# Patient Record
Sex: Male | Born: 1945 | Race: White | Hispanic: No | Marital: Married
Health system: Southern US, Academic
[De-identification: ages and names within clinical notes are randomized; demographics above are authoritative.]

## PROBLEM LIST (undated history)

## (undated) ENCOUNTER — Encounter

## (undated) ENCOUNTER — Telehealth

## (undated) ENCOUNTER — Encounter: Attending: Family Medicine | Primary: Family Medicine

## (undated) ENCOUNTER — Encounter: Attending: Internal Medicine | Primary: Internal Medicine

## (undated) ENCOUNTER — Telehealth
Attending: Student in an Organized Health Care Education/Training Program | Primary: Student in an Organized Health Care Education/Training Program

## (undated) ENCOUNTER — Ambulatory Visit: Payer: MEDICARE

## (undated) ENCOUNTER — Ambulatory Visit: Attending: Family Medicine | Primary: Family Medicine

## (undated) ENCOUNTER — Ambulatory Visit

## (undated) ENCOUNTER — Ambulatory Visit: Payer: MEDICARE | Attending: Family Medicine | Primary: Family Medicine

## (undated) ENCOUNTER — Ambulatory Visit: Payer: Medicare (Managed Care) | Attending: Internal Medicine | Primary: Internal Medicine

## (undated) ENCOUNTER — Non-Acute Institutional Stay
Payer: MEDICARE | Attending: Rehabilitative and Restorative Service Providers" | Primary: Rehabilitative and Restorative Service Providers"

## (undated) ENCOUNTER — Ambulatory Visit: Payer: MEDICARE | Attending: Cardiovascular Disease | Primary: Cardiovascular Disease

## (undated) ENCOUNTER — Ambulatory Visit: Payer: MEDICARE | Attending: Internal Medicine | Primary: Internal Medicine

## (undated) ENCOUNTER — Encounter: Attending: Family | Primary: Family

## (undated) ENCOUNTER — Ambulatory Visit: Payer: MEDICARE | Attending: Family | Primary: Family

## (undated) ENCOUNTER — Other Ambulatory Visit

## (undated) ENCOUNTER — Ambulatory Visit: Payer: Medicare (Managed Care) | Attending: Family Medicine | Primary: Family Medicine

## (undated) ENCOUNTER — Ambulatory Visit: Payer: Medicare (Managed Care)

## (undated) ENCOUNTER — Inpatient Hospital Stay

## (undated) ENCOUNTER — Encounter: Attending: Cardiovascular Disease | Primary: Cardiovascular Disease

## (undated) ENCOUNTER — Non-Acute Institutional Stay: Payer: MEDICARE

## (undated) ENCOUNTER — Ambulatory Visit: Attending: Optometrist | Primary: Optometrist

## (undated) ENCOUNTER — Encounter: Payer: MEDICARE | Attending: Cardiovascular Disease | Primary: Cardiovascular Disease

## (undated) ENCOUNTER — Ambulatory Visit
Payer: MEDICARE | Attending: Student in an Organized Health Care Education/Training Program | Primary: Student in an Organized Health Care Education/Training Program

## (undated) ENCOUNTER — Ambulatory Visit: Payer: MEDICARE | Attending: Physical Medicine & Rehabilitation | Primary: Physical Medicine & Rehabilitation

## (undated) ENCOUNTER — Encounter: Payer: MEDICARE | Attending: Family Medicine | Primary: Family Medicine

## (undated) ENCOUNTER — Telehealth: Attending: Family Medicine | Primary: Family Medicine

## (undated) ENCOUNTER — Ambulatory Visit
Attending: Student in an Organized Health Care Education/Training Program | Primary: Student in an Organized Health Care Education/Training Program

## (undated) ENCOUNTER — Non-Acute Institutional Stay: Payer: MEDICARE | Attending: Cardiovascular Disease | Primary: Cardiovascular Disease

## (undated) ENCOUNTER — Encounter: Attending: Physical Medicine & Rehabilitation | Primary: Physical Medicine & Rehabilitation

## (undated) ENCOUNTER — Encounter: Payer: MEDICARE | Attending: Family | Primary: Family

## (undated) ENCOUNTER — Telehealth: Attending: Internal Medicine | Primary: Internal Medicine

## (undated) ENCOUNTER — Ambulatory Visit
Payer: MEDICARE | Attending: Rehabilitative and Restorative Service Providers" | Primary: Rehabilitative and Restorative Service Providers"

## (undated) ENCOUNTER — Encounter: Payer: MEDICARE | Attending: Pulmonary Disease | Primary: Pulmonary Disease

## (undated) DIAGNOSIS — R351 Nocturia: Secondary | ICD-10-CM

## (undated) DIAGNOSIS — E785 Hyperlipidemia, unspecified: Secondary | ICD-10-CM

## (undated) DIAGNOSIS — M2540 Effusion, unspecified joint: Secondary | ICD-10-CM

## (undated) DIAGNOSIS — N400 Enlarged prostate without lower urinary tract symptoms: Secondary | ICD-10-CM

## (undated) DIAGNOSIS — Z951 Presence of aortocoronary bypass graft: Secondary | ICD-10-CM

## (undated) DIAGNOSIS — T4145XA Adverse effect of unspecified anesthetic, initial encounter: Secondary | ICD-10-CM

## (undated) DIAGNOSIS — M2550 Pain in unspecified joint: Secondary | ICD-10-CM

## (undated) DIAGNOSIS — Z8619 Personal history of other infectious and parasitic diseases: Secondary | ICD-10-CM

## (undated) DIAGNOSIS — I672 Cerebral atherosclerosis: Secondary | ICD-10-CM

## (undated) DIAGNOSIS — R350 Frequency of micturition: Secondary | ICD-10-CM

## (undated) DIAGNOSIS — K219 Gastro-esophageal reflux disease without esophagitis: Secondary | ICD-10-CM

## (undated) DIAGNOSIS — Z9889 Other specified postprocedural states: Secondary | ICD-10-CM

## (undated) DIAGNOSIS — M1990 Unspecified osteoarthritis, unspecified site: Secondary | ICD-10-CM

## (undated) DIAGNOSIS — I2510 Atherosclerotic heart disease of native coronary artery without angina pectoris: Secondary | ICD-10-CM

## (undated) DIAGNOSIS — R112 Nausea with vomiting, unspecified: Secondary | ICD-10-CM

## (undated) DIAGNOSIS — I10 Essential (primary) hypertension: Secondary | ICD-10-CM

## (undated) DIAGNOSIS — F32A Depression, unspecified: Secondary | ICD-10-CM

## (undated) DIAGNOSIS — F329 Major depressive disorder, single episode, unspecified: Secondary | ICD-10-CM

## (undated) DIAGNOSIS — T8859XA Other complications of anesthesia, initial encounter: Secondary | ICD-10-CM

## (undated) DIAGNOSIS — I739 Peripheral vascular disease, unspecified: Secondary | ICD-10-CM

## (undated) HISTORY — PX: FACIAL COSMETIC SURGERY: SHX629

## (undated) HISTORY — DX: Peripheral vascular disease, unspecified: I73.9

## (undated) HISTORY — DX: Cerebral atherosclerosis: I67.2

## (undated) HISTORY — DX: Presence of aortocoronary bypass graft: Z95.1

## (undated) HISTORY — PX: TRANSURETHRAL RESECTION OF PROSTATE: SHX73

## (undated) HISTORY — DX: Atherosclerotic heart disease of native coronary artery without angina pectoris: I25.10

---
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
## 1978-04-07 HISTORY — PX: OTHER SURGICAL HISTORY: SHX169

## 2001-04-07 HISTORY — PX: OTHER SURGICAL HISTORY: SHX169

## 2004-04-07 HISTORY — PX: CORONARY ARTERY BYPASS GRAFT: SHX141

## 2004-08-21 ENCOUNTER — Inpatient Hospital Stay (HOSPITAL_COMMUNITY): Admission: RE | Admit: 2004-08-21 | Discharge: 2004-08-31 | Payer: Self-pay | Admitting: *Deleted

## 2004-08-21 HISTORY — PX: CARDIAC CATHETERIZATION: SHX172

## 2004-09-16 ENCOUNTER — Inpatient Hospital Stay (HOSPITAL_COMMUNITY): Admission: EM | Admit: 2004-09-16 | Discharge: 2004-09-20 | Payer: Self-pay | Admitting: Emergency Medicine

## 2004-09-16 DIAGNOSIS — I2510 Atherosclerotic heart disease of native coronary artery without angina pectoris: Secondary | ICD-10-CM

## 2004-09-16 HISTORY — DX: Atherosclerotic heart disease of native coronary artery without angina pectoris: I25.10

## 2006-02-02 ENCOUNTER — Emergency Department (HOSPITAL_COMMUNITY): Admission: EM | Admit: 2006-02-02 | Discharge: 2006-02-02 | Payer: Self-pay | Admitting: Emergency Medicine

## 2007-01-13 IMAGING — CR DG CHEST 2V
2 series · 2 of 2 positions shown · non-contrast
Comparison: none

CLINICAL DATA: Chest pain, abnormal stress test. Pre-cardiac catheterization.  Cough, shortness of breath, hypertension. 
 CHEST - 2 VIEW: 
 PA and lateral views of the chest are made without previous films for comparison and show mild cardiomegaly but no edema, effusion or pneumothorax. There is mild diffuse peribronchial thickening.   The aorta is minimally elongated but not calcified or dilated.   The bones appear to be within normal limits as do the soft tissues.

[view not recorded (1 of 2)]
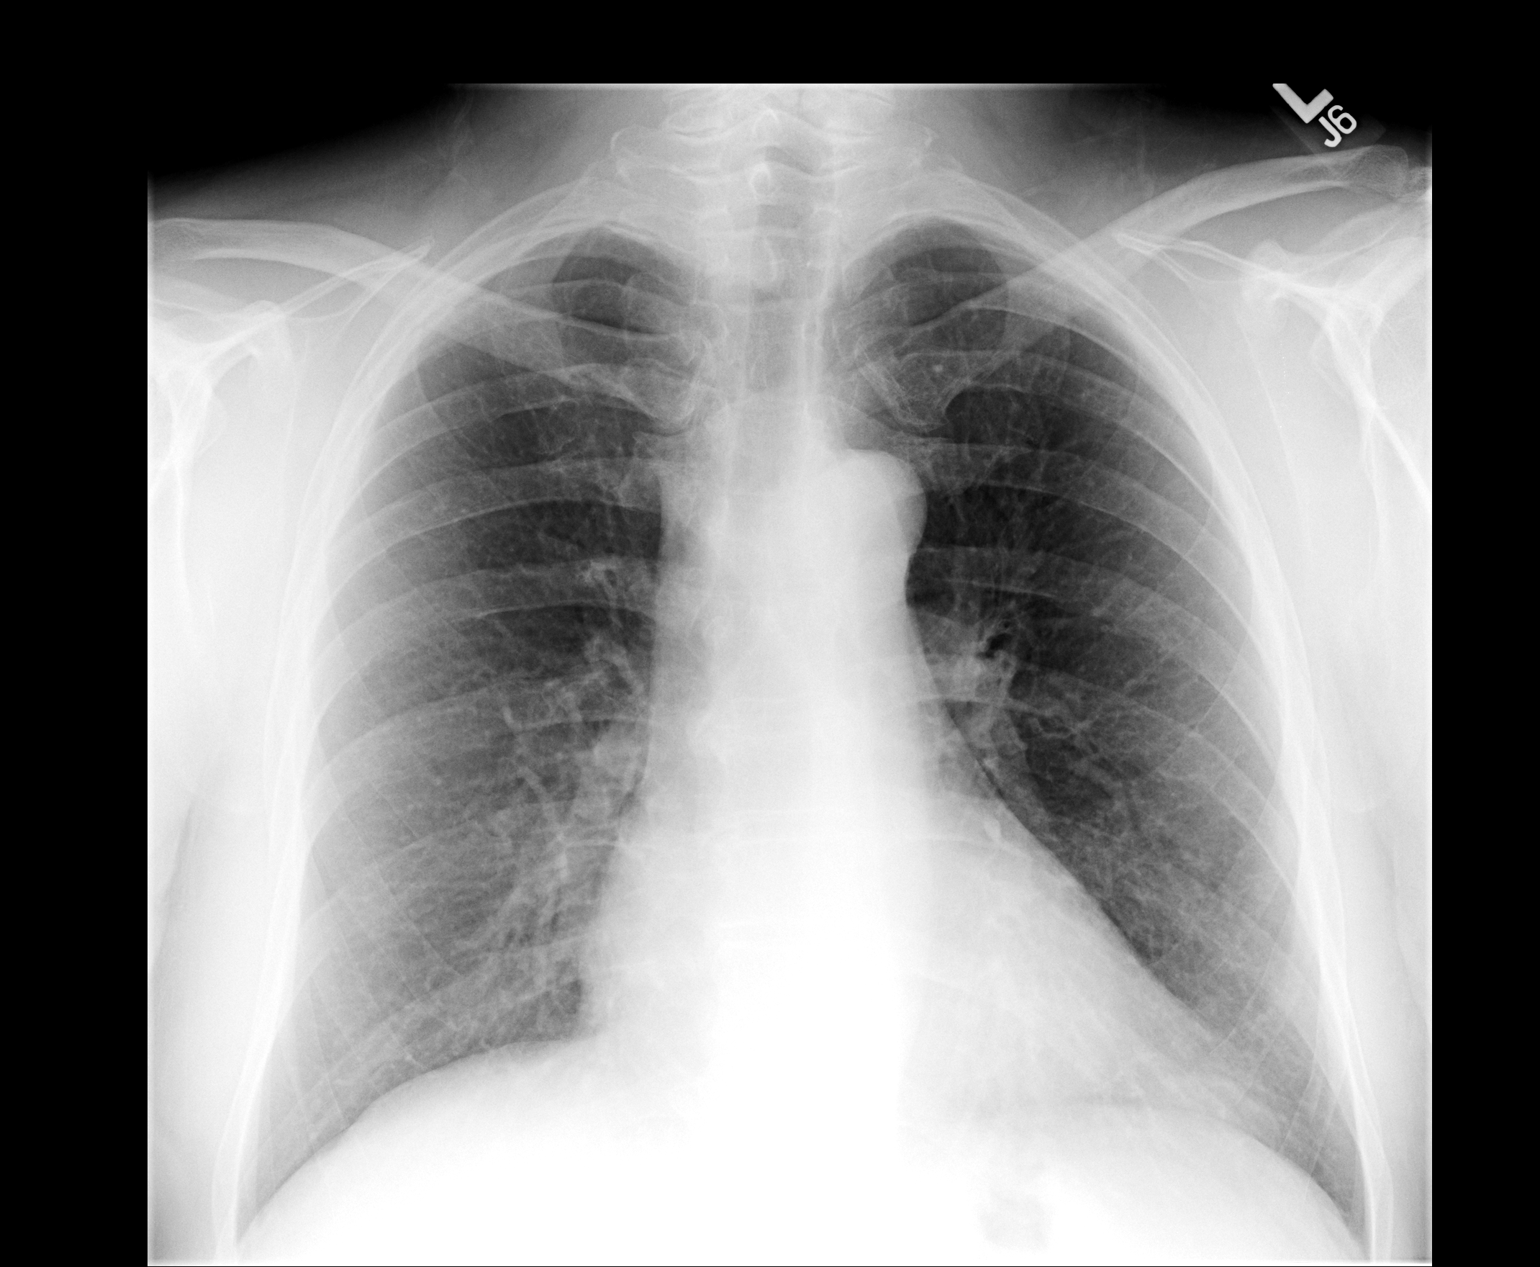

[view not recorded (2 of 2)]
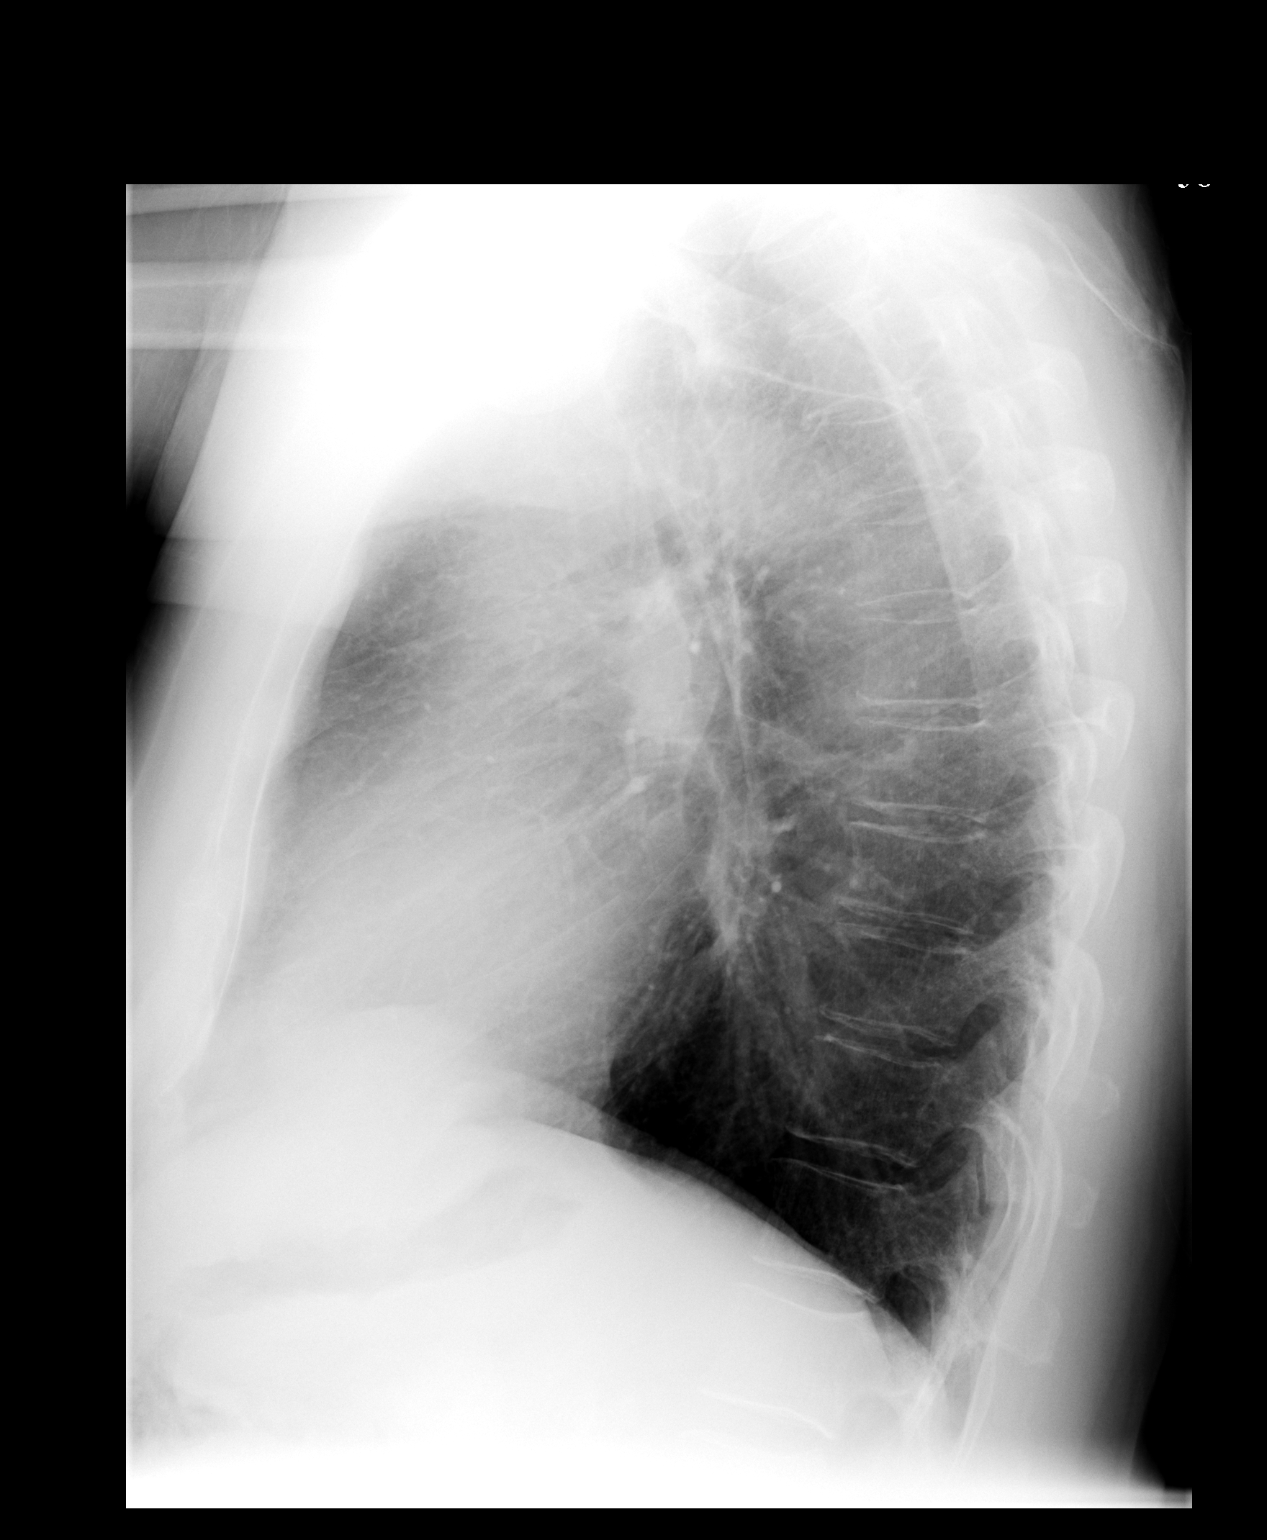

[2 of 2 positions shown; findings below may reference images not displayed]

IMPRESSION: Mild cardiomegaly.  No evidence of acute cardiopulmonary disease.  Mild diffuse peribronchial thickening.

## 2007-01-15 IMAGING — CR DG CHEST 1V PORT
1 series · 1 of 1 positions shown · non-contrast
Comparison: 08/22/04.

CLINICAL DATA: Coronary artery disease. Postop from coronary artery bypass grafting.
 PORTABLE [id number] VIEW:

[view not recorded]
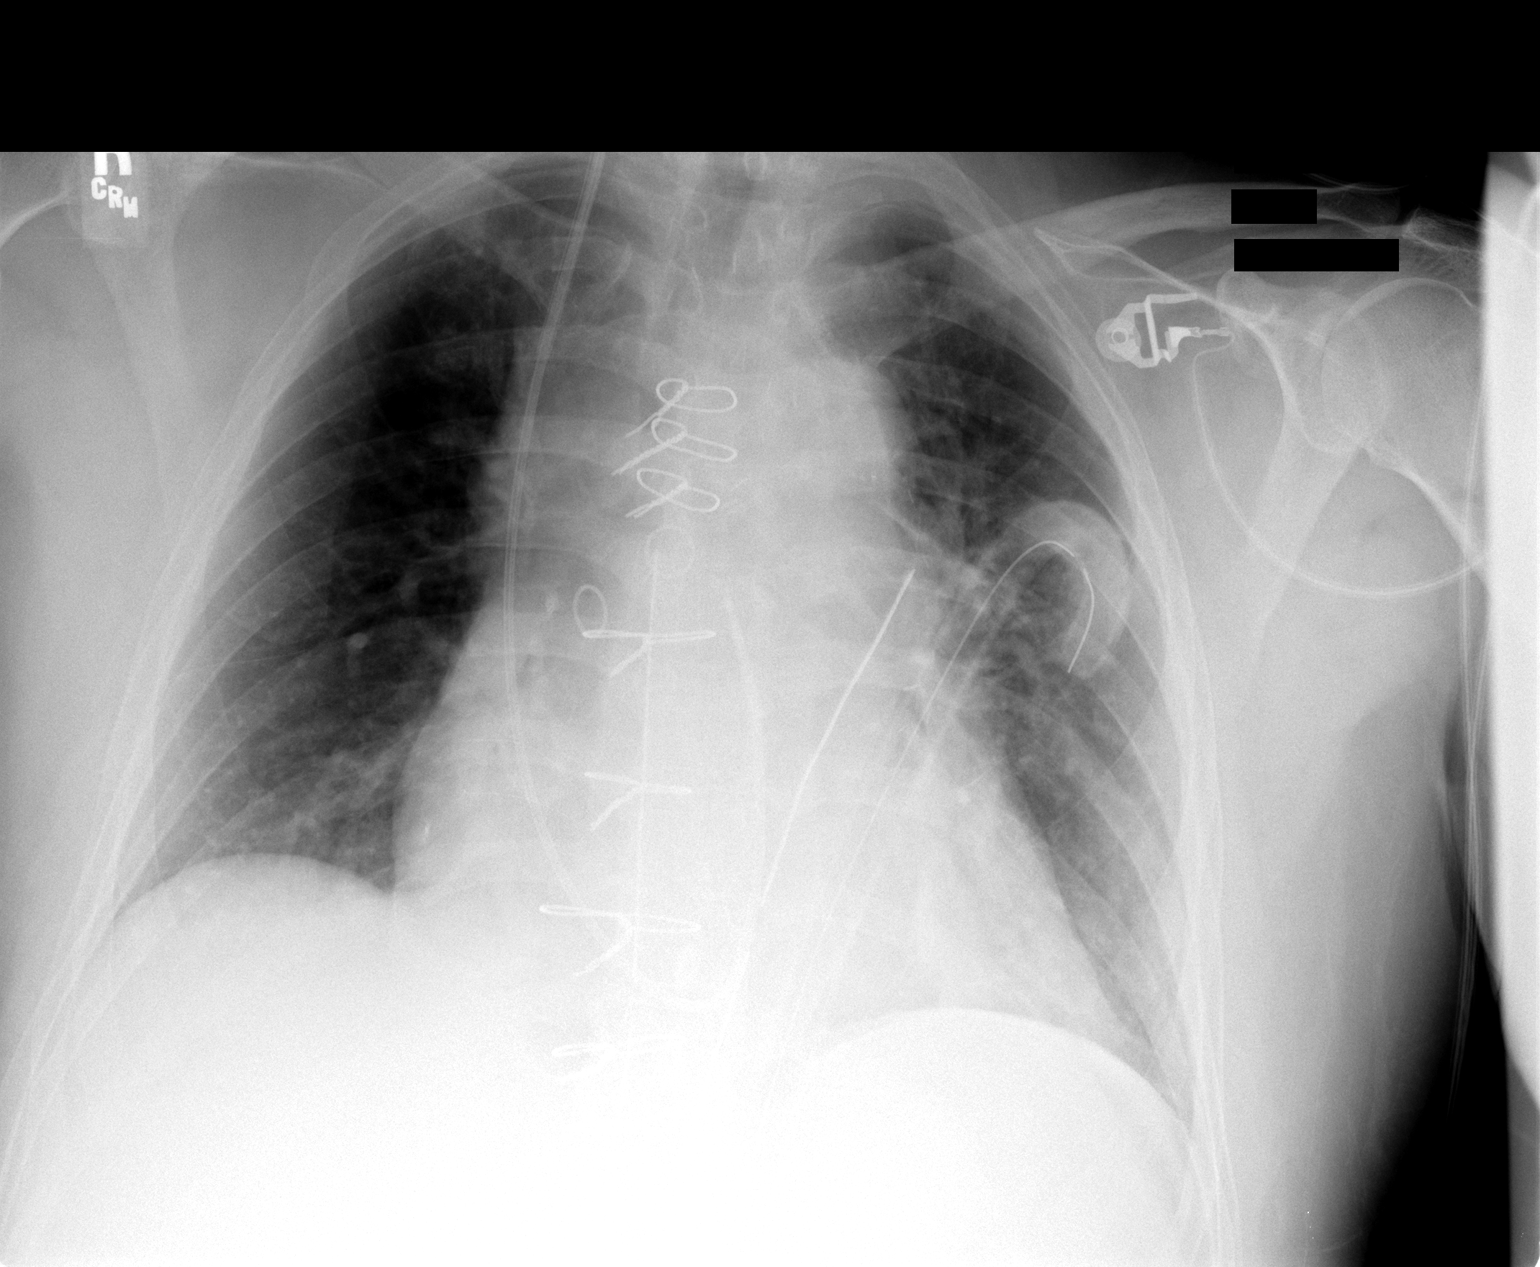

[1 of 1 positions shown; findings below may reference images not displayed]

Endotracheal tube and nasogastric tube have been removed.  Mild left mid and lower lung atelectasis are unchanged.  Cardiomegaly is stable.
IMPRESSION: Mild left mid and lower lung atelectasis, without significant change.

## 2009-04-07 HISTORY — PX: CHOLECYSTECTOMY: SHX55

## 2010-04-07 HISTORY — PX: KNEE ARTHROSCOPY: SUR90

## 2010-04-07 HISTORY — PX: JOINT REPLACEMENT: SHX530

## 2010-04-28 ENCOUNTER — Encounter: Payer: Self-pay | Admitting: Thoracic Surgery (Cardiothoracic Vascular Surgery)

## 2010-12-13 ENCOUNTER — Other Ambulatory Visit: Payer: Self-pay | Admitting: Orthopedic Surgery

## 2010-12-13 ENCOUNTER — Encounter (HOSPITAL_COMMUNITY): Payer: Medicare Other

## 2010-12-13 LAB — URINALYSIS, ROUTINE W REFLEX MICROSCOPIC
Bilirubin Urine: NEGATIVE
Hgb urine dipstick: NEGATIVE
Ketones, ur: NEGATIVE mg/dL
Nitrite: NEGATIVE
Protein, ur: NEGATIVE mg/dL
Specific Gravity, Urine: 1.018 (ref 1.005–1.030)
Urobilinogen, UA: 1 mg/dL (ref 0.0–1.0)

## 2010-12-13 LAB — COMPREHENSIVE METABOLIC PANEL
Albumin: 3.7 g/dL (ref 3.5–5.2)
Alkaline Phosphatase: 81 U/L (ref 39–117)
BUN: 14 mg/dL (ref 6–23)
Creatinine, Ser: 1.39 mg/dL — ABNORMAL HIGH (ref 0.50–1.35)
GFR calc Af Amer: >60 mL/min (ref >60)
Glucose, Bld: 101 mg/dL — ABNORMAL HIGH (ref 70–99)
Potassium: 4.3 mEq/L (ref 3.5–5.1)
Total Protein: 7 g/dL (ref 6.0–8.3)

## 2010-12-13 LAB — DIFFERENTIAL
Basophils Absolute: 0.1 10*3/uL (ref 0.0–0.1)
Basophils Relative: 1 % (ref 0–1)
Eosinophils Absolute: 0.2 10*3/uL (ref 0.0–0.7)
Eosinophils Relative: 3 % (ref 0–5)
Lymphs Abs: 1.4 10*3/uL (ref 0.7–4.0)
Neutrophils Relative %: 67 % (ref 43–77)

## 2010-12-13 LAB — CBC
MCH: 29.1 pg (ref 26.0–34.0)
MCHC: 33.7 g/dL (ref 30.0–36.0)
MCV: 86.6 fL (ref 78.0–100.0)
Platelets: 270 10*3/uL (ref 150–400)
RDW: 12.3 % (ref 11.5–15.5)

## 2010-12-13 LAB — PROTIME-INR
INR: 1.04 (ref 0.00–1.49)
Prothrombin Time: 13.8 seconds (ref 11.6–15.2)

## 2010-12-13 LAB — SURGICAL PCR SCREEN
MRSA, PCR: NEGATIVE
Staphylococcus aureus: NEGATIVE

## 2010-12-19 ENCOUNTER — Inpatient Hospital Stay (HOSPITAL_COMMUNITY)
Admission: RE | Admit: 2010-12-19 | Discharge: 2010-12-23 | DRG: 470 | Disposition: A | Discharge status: Home Health | Payer: Medicare Other | Source: Ambulatory Visit | Attending: Orthopedic Surgery | Admitting: Orthopedic Surgery

## 2010-12-19 DIAGNOSIS — K219 Gastro-esophageal reflux disease without esophagitis: Secondary | ICD-10-CM | POA: Diagnosis present

## 2010-12-19 DIAGNOSIS — Z01812 Encounter for preprocedural laboratory examination: Secondary | ICD-10-CM

## 2010-12-19 DIAGNOSIS — D62 Acute posthemorrhagic anemia: Secondary | ICD-10-CM | POA: Diagnosis not present

## 2010-12-19 DIAGNOSIS — M1710 Unilateral primary osteoarthritis, unspecified knee: Principal | ICD-10-CM | POA: Diagnosis present

## 2010-12-19 DIAGNOSIS — I10 Essential (primary) hypertension: Secondary | ICD-10-CM | POA: Diagnosis present

## 2010-12-19 DIAGNOSIS — I2510 Atherosclerotic heart disease of native coronary artery without angina pectoris: Secondary | ICD-10-CM | POA: Diagnosis present

## 2010-12-19 DIAGNOSIS — Z951 Presence of aortocoronary bypass graft: Secondary | ICD-10-CM

## 2010-12-19 DIAGNOSIS — Z791 Long term (current) use of non-steroidal anti-inflammatories (NSAID): Secondary | ICD-10-CM

## 2010-12-19 DIAGNOSIS — Z6828 Body mass index (BMI) 28.0-28.9, adult: Secondary | ICD-10-CM

## 2010-12-19 DIAGNOSIS — E663 Overweight: Secondary | ICD-10-CM | POA: Diagnosis present

## 2010-12-19 LAB — TYPE AND SCREEN: Antibody Screen: NEGATIVE

## 2010-12-20 LAB — CBC
Hemoglobin: 11.4 g/dL — ABNORMAL LOW (ref 13.0–17.0)
MCH: 29.2 pg (ref 26.0–34.0)
MCHC: 33.7 g/dL (ref 30.0–36.0)
MCV: 86.7 fL (ref 78.0–100.0)
RBC: 3.9 MIL/uL — ABNORMAL LOW (ref 4.22–5.81)

## 2010-12-20 LAB — BASIC METABOLIC PANEL
CO2: 30 mEq/L (ref 19–32)
Calcium: 8.3 mg/dL — ABNORMAL LOW (ref 8.4–10.5)
Creatinine, Ser: 0.98 mg/dL (ref 0.50–1.35)
GFR calc non Af Amer: >60 mL/min (ref >60)
Glucose, Bld: 126 mg/dL — ABNORMAL HIGH (ref 70–99)

## 2010-12-21 LAB — CBC
MCH: 29.6 pg (ref 26.0–34.0)
MCHC: 34.1 g/dL (ref 30.0–36.0)
MCV: 86.8 fL (ref 78.0–100.0)
Platelets: 232 10*3/uL (ref 150–400)
RDW: 12.7 % (ref 11.5–15.5)

## 2010-12-21 LAB — BASIC METABOLIC PANEL
Calcium: 8.6 mg/dL (ref 8.4–10.5)
Creatinine, Ser: 1.36 mg/dL — ABNORMAL HIGH (ref 0.50–1.35)
GFR calc Af Amer: >60 mL/min (ref >60)
GFR calc non Af Amer: 52 mL/min — ABNORMAL LOW (ref >60)

## 2010-12-22 LAB — CBC
HCT: 31.7 % — ABNORMAL LOW (ref 39.0–52.0)
MCH: 29.9 pg (ref 26.0–34.0)
MCV: 87.1 fL (ref 78.0–100.0)
Platelets: 246 10*3/uL (ref 150–400)
RDW: 12.7 % (ref 11.5–15.5)

## 2010-12-22 LAB — BASIC METABOLIC PANEL
BUN: 22 mg/dL (ref 6–23)
CO2: 32 mEq/L (ref 19–32)
Calcium: 9 mg/dL (ref 8.4–10.5)
Chloride: 95 mEq/L — ABNORMAL LOW (ref 96–112)
Creatinine, Ser: 1.33 mg/dL (ref 0.50–1.35)
Glucose, Bld: 102 mg/dL — ABNORMAL HIGH (ref 70–99)

## 2011-01-08 NOTE — Op Note (Signed)
NAME:  Jeremiah Rodriguez, Jeremiah Rodriguez NO.:  [id number]  MEDICAL RECORD NO.:  [id number]  LOCATION:  1610                         FACILITY:  [facility]  PHYSICIAN:  Aowyn Rozeboom L. Rendall, M.D.  DATE OF BIRTH:  1946-02-15  DATE OF PROCEDURE: DATE OF DISCHARGE:                              OPERATIVE REPORT   PREOPERATIVE DIAGNOSIS:  Osteoarthritis, right knee.  POSTOPERATIVE DIAGNOSIS:  Osteoarthritis, right knee.  SURGICAL PROCEDURES:  Right LCS total knee arthroplasty with computer navigation assistance.  SURGEON:  Cindi Ghazarian L. Rendall, M.D.  ASSISTANTSu Hilt, PA-C.  ANESTHESIA:  Spinal.  PATHOLOGY:  This 65 year old white male has had 8 months of unremitting right knee pain despite all conservative measures.  He has simply not responded and has pain with weightbearing.  X-rays showed bone against bone in the lateral compartment.  He did have an arthroscopy at the end of last year with grade 3 and 4 changes in patellofemoral and lateral compartments, and this was confirmed at this time.  PROCEDURE:  Under spinal anesthesia, right knee was prepared with DuraPrep and draped as a sterile field.  The sterile tourniquet was applied proximally.  Legs wrapped out with the Esmarch and the tourniquet was used at 350 mm.  Midline incision was made.  The patella was everted.  The femur was sized at a standard plus.  The knee was debrided in preparation for computer mapping.  Schanz pins were placed in the superior medial tibia and distal femur within the incision.  The arrays were set up.  Femoral head was identified as well as the malleoli, and then proximal tibia and distal femur were mapped.  Once this was completed, proximal tibial resection was done within 1 degree of anatomic alignment.  The balancer was then used and the gaps were balanced.  Once this was measured, both flexion and extension, the first femoral guide was used and the anterior and posterior flare of the distal femur  were resected.  The distal femoral cut was then resected. Flexion and extension gaps were balanced at approximately 10 mm each. Remnants of menisci and cruciates were then removed with assistance of a lamina spreader.  Spurs were peg taken off the back of the femoral condyles.  Recessing guide was then used.  Following this, the proximal tibia was exposed.  Minor bone remnants along the margin were resected. The tibia was sized to a #4 center peg hole with keel were inserted. The trial of a 10 bearing and the standard plus femur revealed excellent fit, alignment but slight laxity to varus valgus.  The switching to a 12.5 bearing has alignment within 1 degree of anatomic and the joint was nice and stable and the varus valgus and the knee flexes to 140 degrees on the table.  The arrays were then taken down.  Permanent components were obtained.  Bony surfaces were prepared with pulse irrigation.  Bone cement was then prepared and all components were cemented in place. While the cement was hardening, synovectomy was completed.  The medium Hemovac drain was inserted with the cement hard, excess was removed and the tourniquet was let down at 57 minutes.  Multiple small vessels were cauterized.  The knee was  closed in layers with #1 Tycron, 0 Vicryl, 2-0 Vicryl, and skin clips.  Operative time approximately an hour and 20 minutes.  The patient tolerated the procedure well and returned to recovery in good condition.     Addisyn Leclaire L. Priscille Kluver, M.D.     Renato Gails  D:  12/19/2010  T:  12/19/2010  Job:  [id number]  Electronically Signed by Erasmo Leventhal M.D. on 01/08/2011 02:04:00 PM

## 2011-01-24 NOTE — Discharge Summary (Signed)
NAME:  Jeremiah, Rodriguez NO.:  [id number]  MEDICAL RECORD NO.:  [id number]  LOCATION:  1610                         FACILITY:  [facility]  PHYSICIAN:  Alitzel Cookson L. Rendall, M.D.  DATE OF BIRTH:  31-Oct-1945  DATE OF ADMISSION:  12/19/2010 DATE OF DISCHARGE:  12/23/2010                              DISCHARGE SUMMARY   ADMISSION DIAGNOSES: 1. End-stage osteoarthritis, right knee. 2. Overweight. 3. Hypertension. 4. Coronary artery disease with history of coronary artery bypass     graft. 5. Gastroesophageal reflux disease. 6. Hypercholesterolemia. 7. History of benign prostatic hypertrophy.  DISCHARGE DIAGNOSES: 1. End-stage osteoarthritis, right knee; status post right total knee     arthroplasty. 2. Acute blood loss anemia secondary to surgery. 3. Hypotension. 4. Overweight. 5. Hypertension. 6. Coronary artery disease with history of coronary artery bypass     graft. 7. Gastroesophageal reflux disease. 8. Hypercholesterolemia. 9. History of benign prostatic hypertrophy.  SURGICAL PROCEDURE:  On December 19, 2010, Mr. Jeremiah Rodriguez underwent a right total knee arthroplasty with computer navigation assisted by Johnnette Litter, PA-C.  He had a DePuy primary femoral component cemented size standard plus right placed with a tibial tray rotating platform and BT keel size 4 cemented.  An LCS complete tibial insert rotating platform 12.5 mm thickness standard plus and LCS complete metal-backed patella cemented size standard plus.  COMPLICATIONS:  None.  CONSULTS: 1. Physical therapy consult, December 20, 2010. 2. Case management consult, December 20, 2010.  HISTORY OF PRESENT ILLNESS:  This 65 year old white male patient presented to Dr. Priscille Kluver with history of a knee scope in December, 2011 and a 5-year history of gradual onset progressive right knee pain without injury.  At this point, the right knee pain is a constant throbbing ache diffuse about the knee with  radiation into the anterior tibia.  It increases with his knees touching at night and nothing makes it better.  It catches, it pops, it gives way, it swells, and it keeps him up at night.  He has failed NSAIDs and cortisone treatment, and x- ray showed bone-on-bone on lateral compartment of the knee with significant osteophytes.  Because of this, he is presenting for a right knee replacement.  HOSPITAL COURSE:  Mr. Arenson tolerated his surgical procedure well without immediate postoperative complications.  He was transferred to the orthopedic floor.  Postop day 1, he was afebrile.  Vitals were stable.  Hemoglobin 11.4 and hematocrit 33.8.  He had some nausea during the night and that was monitored and medicated as needed.  He was started on therapy per protocol and PCA was discontinued.  On postop day 2, he was still on clear liquid diet, but nausea was improving.  Vitals were stable.  Hemoglobin and hematocrit were stable, but he did have a low sodium at 130.  He was continued on therapy per protocol.  On postop day #3, he continued to do well.  His diet was advanced.  He was continued on therapy and on September 17th, he was afebrile, vitals stable, felt to be well enough for discharge home.  He was discharged home later that day.  DISCHARGE INSTRUCTIONS:  Diet:  He is to resume his regular prehospitalization diet.  Medications:  Please see the patient's med discharge instruction sheet for complete documentation of meds, but new meds replaced him on were Robaxin, Percocet, and Xarelto.  He was to hold his aspirin until off Xarelto.  ACTIVITY:  He can be out of bed weightbearing as tolerated on the right leg with use of a walker.  No lifting or driving for 6 weeks.  Please see the white total joint discharge sheet for further activity instructions.  Wound care:  Please see the white total joint discharge sheet for further wound care instructions.  FOLLOW UP:  He is to follow  up with Dr. Priscille Kluver in our office on Tuesday, September 25th and needs to call [phone] for that appointment. He is set up for home health per [place] in [place].  LABORATORY DATA:  Hemoglobin and hematocrit ranged from 11.4 and 33.8 on September 14th to 10.9 and 31.7 on September 16th.  White count went from 9.7 on September 14th to 13.8 on September 16th.  Platelets went from 216 on September 14th to 246 on September 16th.  Sodium was low at 133 on September 14th and went to 130 on September 15th at 133 on September 16th.  BUN and creatinine went from 15 and 0.98 on September 14th to 22 and 1.33 on September 16th.  Glucose ranged from 84-126.  Calcium was low on September 14th at 8.3 and then bounced back up to 9 on September 16th.  Other laboratory studies were within normal limits.     Legrand Pitts Duffy, P.A.   ______________________________ Carlisle Beers. Priscille Kluver, M.D.    KED/MEDQ  D:  01/16/2011  T:  01/16/2011  Job:  [id number]  Electronically Signed by Otilio Jefferson. on 01/21/2011 08:03:03 AM Electronically Signed by Erasmo Leventhal M.D. on 01/24/2011 08:21:41 AM

## 2011-04-22 DIAGNOSIS — I739 Peripheral vascular disease, unspecified: Secondary | ICD-10-CM

## 2011-04-22 HISTORY — DX: Peripheral vascular disease, unspecified: I73.9

## 2011-04-23 DIAGNOSIS — I672 Cerebral atherosclerosis: Secondary | ICD-10-CM

## 2011-04-23 HISTORY — DX: Cerebral atherosclerosis: I67.2

## 2012-01-27 ENCOUNTER — Other Ambulatory Visit: Payer: Self-pay | Admitting: Orthopedic Surgery

## 2012-02-03 ENCOUNTER — Encounter (HOSPITAL_COMMUNITY): Payer: Self-pay

## 2012-02-03 ENCOUNTER — Encounter (HOSPITAL_COMMUNITY)
Admission: RE | Admit: 2012-02-03 | Discharge: 2012-02-03 | Disposition: A | Discharge status: Home / Self Care | Payer: Medicare Other | Source: Ambulatory Visit | Attending: Orthopedic Surgery | Admitting: Orthopedic Surgery

## 2012-02-03 HISTORY — DX: Nausea with vomiting, unspecified: R11.2

## 2012-02-03 HISTORY — DX: Nocturia: R35.1

## 2012-02-03 HISTORY — DX: Adverse effect of unspecified anesthetic, initial encounter: T41.45XA

## 2012-02-03 HISTORY — DX: Essential (primary) hypertension: I10

## 2012-02-03 HISTORY — DX: Unspecified osteoarthritis, unspecified site: M19.90

## 2012-02-03 HISTORY — DX: Frequency of micturition: R35.0

## 2012-02-03 HISTORY — DX: Benign prostatic hyperplasia without lower urinary tract symptoms: N40.0

## 2012-02-03 HISTORY — DX: Other complications of anesthesia, initial encounter: T88.59XA

## 2012-02-03 HISTORY — DX: Pain in unspecified joint: M25.50

## 2012-02-03 HISTORY — DX: Major depressive disorder, single episode, unspecified: F32.9

## 2012-02-03 HISTORY — DX: Depression, unspecified: F32.A

## 2012-02-03 HISTORY — DX: Effusion, unspecified joint: M25.40

## 2012-02-03 HISTORY — DX: Personal history of other infectious and parasitic diseases: Z86.19

## 2012-02-03 HISTORY — DX: Hyperlipidemia, unspecified: E78.5

## 2012-02-03 HISTORY — DX: Gastro-esophageal reflux disease without esophagitis: K21.9

## 2012-02-03 HISTORY — DX: Other specified postprocedural states: Z98.890

## 2012-02-03 LAB — CBC WITH DIFFERENTIAL/PLATELET
Hemoglobin: 13.8 g/dL (ref 13.0–17.0)
Lymphocytes Relative: 25 % (ref 12–46)
Lymphs Abs: 1.5 10*3/uL (ref 0.7–4.0)
MCV: 87.4 fL (ref 78.0–100.0)
Monocytes Relative: 9 % (ref 3–12)
Neutrophils Relative %: 56 % (ref 43–77)
Platelets: 253 10*3/uL (ref 150–400)
RBC: 4.62 MIL/uL (ref 4.22–5.81)
WBC: 6.2 10*3/uL (ref 4.0–10.5)

## 2012-02-03 LAB — COMPREHENSIVE METABOLIC PANEL
ALT: 17 U/L (ref 0–53)
Alkaline Phosphatase: 58 U/L (ref 39–117)
CO2: 29 mEq/L (ref 19–32)
Chloride: 103 mEq/L (ref 96–112)
GFR calc Af Amer: 76 mL/min — ABNORMAL LOW (ref >90)
GFR calc non Af Amer: 65 mL/min — ABNORMAL LOW (ref >90)
Glucose, Bld: 97 mg/dL (ref 70–99)
Potassium: 4 mEq/L (ref 3.5–5.1)
Sodium: 139 mEq/L (ref 135–145)
Total Bilirubin: 0.4 mg/dL (ref 0.3–1.2)

## 2012-02-03 LAB — URINALYSIS, ROUTINE W REFLEX MICROSCOPIC
Bilirubin Urine: NEGATIVE
Glucose, UA: NEGATIVE mg/dL
Hgb urine dipstick: NEGATIVE
Ketones, ur: NEGATIVE mg/dL
Protein, ur: NEGATIVE mg/dL

## 2012-02-03 LAB — SURGICAL PCR SCREEN: Staphylococcus aureus: NEGATIVE

## 2012-02-03 LAB — TYPE AND SCREEN

## 2012-02-03 MED ORDER — CHLORHEXIDINE GLUCONATE 4 % EX LIQD: 60.0000 mL | Freq: Once | CUTANEOUS | Status: DC

## 2012-02-03 NOTE — Progress Notes (Addendum)
Dr,Hilty with [facility] is cardiologist-last visit was 8months ago-to request report  Stress test done about a yr ago with Dr.Hilty-to request  Heart cath in 2006 Echo test done about a yr ago with Dr.Hilty-to request  Dr.Keith Clarene Duke in [place] is Medical MD  EKG to be requested from [facility] [phone]  CXR done with Dr.McManus about a month ago-to request report

## 2012-02-03 NOTE — Pre-Procedure Instructions (Signed)
20 Jeremiah Rodriguez  02/03/2012   Your procedure is scheduled on:  Mon, Nov 4 @ 10:00 AM  Report to [facility] at 8:00 AM.  Call this number if you have problems the morning of surgery: [phone]   Remember:   Do not eat food:After Midnight.    Take these medicines the morning of surgery with A SIP OF WATER: Metoprolol(Lopressor)   Do not wear jewelry  Do not wear lotions, powders, or colognes. You may wear deodorant.  Men may shave face and neck.  Do not bring valuables to the hospital.  Contacts, dentures or bridgework may not be worn into surgery.  Leave suitcase in the car. After surgery it may be brought to your room.  For patients admitted to the hospital, checkout time is 11:00 AM the day of discharge.   Patients discharged the day of surgery will not be allowed to drive home.    Special Instructions: Shower using CHG 2 nights before surgery and the night before surgery.  If you shower the day of surgery use CHG.  Use special wash - you have one bottle of CHG for all showers.  You should use approximately 1/3 of the bottle for each shower.   Please read over the following fact sheets that you were given: Pain Booklet, Coughing and Deep Breathing, Blood Transfusion Information, Total Joint Packet, MRSA Information and Surgical Site Infection Prevention

## 2012-02-03 NOTE — Progress Notes (Signed)
After bypass surgery-10+days after pt started feeling bad so an echo was done and found blood around heart--went back into surgery to fix this and pt states everything cleared up perfectly --2006

## 2012-02-04 ENCOUNTER — Other Ambulatory Visit: Payer: Self-pay | Admitting: Orthopedic Surgery

## 2012-02-04 LAB — URINE CULTURE
Colony Count: NO GROWTH
Culture: NO GROWTH

## 2012-02-04 NOTE — Consult Note (Addendum)
Anesthesia Chart Review:  Patient is a 66 year old male scheduled for right poly exchange versus TK revision on 02/09/12 by Dr. Sherlean Foot.  History includes post-operative N/V, HLD, HTN, CAD s/p CABG X 3 '06, GERD, BPH, Lyme disease, depression, facial cosmetic surgery, right TKA by Dr. Priscille Kluver 12/19/10 (done under spinal anesthesia).  PCP is Dr. Jeanmarie Plant in [place] who cleared patient from a medical and cardiac standpoint. His Cardiologist is Dr. Rennis [facility]), the last note received was from March 2012.    Nuclear stress test on 05/30/10 showed normal myocardial perfusion, EF 61%, normal LV systolic function, no significant wall motion abnormalities.   Echo on 04/23/06 showed borderline asymmetric LVH, normal LV systolic function, no pericardial effusion.  EKG and CXR from Dr. Clarene Duke are still pending.    Labs noted.  I reviewed available records with Anesthesiologist Dr. Michelle Piper.  Since patient had a stress test less then two years ago and received medical clearance from his PCP then would anticipate patient could proceed as planned if no new CV symptoms.   I'll re-request PCP records (office note, EKG, CXR) and follow-up when records are available.  Shonna Chock, PA-C 02/04/12 1032  Addendum: 02/06/12 1145 There have been two previous requests for medical records from Dr. Clarene Duke' office to no avail.  I called his office this morning to re-request, but there was a message stating his office would be closed on 02/05/12 and 02/06/12, but would be open as usual on 02/09/12.  I attempted to contact Mr. Witucki, but there was no answer or voice mail set-up.  I did contact [facility] medical records that stated that there was no CXR or EKG on file.  Unfortunately, our only option is to call Dr. Clarene Duke' office first thing on Monday morning (this case is already scheduled for Dr. Tobin Chad second case @ 1000) to request the records STAT or repeat them on patient's arrival to Short Stay.

## 2012-02-09 ENCOUNTER — Encounter (HOSPITAL_COMMUNITY): Payer: Self-pay | Admitting: Vascular Surgery

## 2012-02-09 ENCOUNTER — Encounter (HOSPITAL_COMMUNITY)
Admission: RE | Disposition: A | Discharge status: Home Health | Payer: Self-pay | Source: Ambulatory Visit | Attending: Orthopedic Surgery

## 2012-02-09 ENCOUNTER — Inpatient Hospital Stay (HOSPITAL_COMMUNITY)
Admission: RE | Admit: 2012-02-09 | Discharge: 2012-02-11 | DRG: 464 | Disposition: A | Discharge status: Home Health | Payer: Medicare Other | Source: Ambulatory Visit | Attending: Orthopedic Surgery | Admitting: Orthopedic Surgery

## 2012-02-09 ENCOUNTER — Inpatient Hospital Stay (HOSPITAL_COMMUNITY): Payer: Medicare Other

## 2012-02-09 ENCOUNTER — Inpatient Hospital Stay (HOSPITAL_COMMUNITY): Payer: Medicare Other | Admitting: Vascular Surgery

## 2012-02-09 DIAGNOSIS — M25569 Pain in unspecified knee: Secondary | ICD-10-CM | POA: Diagnosis present

## 2012-02-09 DIAGNOSIS — K219 Gastro-esophageal reflux disease without esophagitis: Secondary | ICD-10-CM | POA: Diagnosis present

## 2012-02-09 DIAGNOSIS — T8489XA Other specified complication of internal orthopedic prosthetic devices, implants and grafts, initial encounter: Secondary | ICD-10-CM | POA: Diagnosis present

## 2012-02-09 DIAGNOSIS — I10 Essential (primary) hypertension: Secondary | ICD-10-CM | POA: Diagnosis present

## 2012-02-09 DIAGNOSIS — E785 Hyperlipidemia, unspecified: Secondary | ICD-10-CM | POA: Diagnosis present

## 2012-02-09 DIAGNOSIS — I2510 Atherosclerotic heart disease of native coronary artery without angina pectoris: Secondary | ICD-10-CM | POA: Diagnosis present

## 2012-02-09 DIAGNOSIS — M129 Arthropathy, unspecified: Secondary | ICD-10-CM | POA: Diagnosis present

## 2012-02-09 DIAGNOSIS — D62 Acute posthemorrhagic anemia: Secondary | ICD-10-CM | POA: Diagnosis not present

## 2012-02-09 DIAGNOSIS — F329 Major depressive disorder, single episode, unspecified: Secondary | ICD-10-CM | POA: Diagnosis present

## 2012-02-09 DIAGNOSIS — M25561 Pain in right knee: Secondary | ICD-10-CM

## 2012-02-09 DIAGNOSIS — Y831 Surgical operation with implant of artificial internal device as the cause of abnormal reaction of the patient, or of later complication, without mention of misadventure at the time of the procedure: Secondary | ICD-10-CM | POA: Diagnosis present

## 2012-02-09 DIAGNOSIS — T84099A Other mechanical complication of unspecified internal joint prosthesis, initial encounter: Principal | ICD-10-CM | POA: Diagnosis present

## 2012-02-09 DIAGNOSIS — Z01812 Encounter for preprocedural laboratory examination: Secondary | ICD-10-CM

## 2012-02-09 DIAGNOSIS — Z96659 Presence of unspecified artificial knee joint: Secondary | ICD-10-CM

## 2012-02-09 DIAGNOSIS — F3289 Other specified depressive episodes: Secondary | ICD-10-CM | POA: Diagnosis present

## 2012-02-09 DIAGNOSIS — Z951 Presence of aortocoronary bypass graft: Secondary | ICD-10-CM

## 2012-02-09 DIAGNOSIS — N400 Enlarged prostate without lower urinary tract symptoms: Secondary | ICD-10-CM | POA: Diagnosis present

## 2012-02-09 HISTORY — PX: EXCISIONAL TOTAL KNEE ARTHROPLASTY: SHX5015

## 2012-02-09 LAB — GRAM STAIN: Gram Stain: NONE SEEN

## 2012-02-09 SURGERY — EXCISIONAL TOTAL KNEE ARTHROPLASTY
Anesthesia: General | Site: Knee | Laterality: Right | Wound class: Clean

## 2012-02-09 MED ORDER — HYDROCHLOROTHIAZIDE 12.5 MG PO CAPS
12.5000 mg | ORAL_CAPSULE | Freq: Every day | ORAL | Status: DC
  Administered 2012-02-09 – 2012-02-11 (×3): 12.5 mg via ORAL
  Filled 2012-02-09 (×4): qty 1

## 2012-02-09 MED ORDER — METHOCARBAMOL 100 MG/ML IJ SOLN
500.0000 mg | Freq: Once | INTRAVENOUS | Status: AC
  Administered 2012-02-09: 500 mg via INTRAVENOUS
  Filled 2012-02-09: qty 5

## 2012-02-09 MED ORDER — PHENYLEPHRINE HCL 10 MG/ML IJ SOLN
INTRAMUSCULAR | Status: DC | PRN
  Administered 2012-02-09 (×5): 80 ug via INTRAVENOUS

## 2012-02-09 MED ORDER — ROCURONIUM BROMIDE 100 MG/10ML IV SOLN
INTRAVENOUS | Status: DC | PRN
  Administered 2012-02-09: 50 mg via INTRAVENOUS

## 2012-02-09 MED ORDER — OXYCODONE HCL ER 10 MG PO T12A
10.0000 mg | EXTENDED_RELEASE_TABLET | Freq: Two times a day (BID) | ORAL | Status: DC
  Administered 2012-02-09 – 2012-02-11 (×5): 10 mg via ORAL
  Filled 2012-02-09 (×5): qty 1

## 2012-02-09 MED ORDER — BENAZEPRIL-HYDROCHLOROTHIAZIDE 20-12.5 MG PO TABS: 1.0000 | ORAL_TABLET | Freq: Every day | ORAL | Status: DC

## 2012-02-09 MED ORDER — ONDANSETRON HCL 4 MG/2ML IJ SOLN
INTRAMUSCULAR | Status: DC | PRN
  Administered 2012-02-09: 4 mg via INTRAVENOUS

## 2012-02-09 MED ORDER — LIDOCAINE HCL 4 % MT SOLN
OROMUCOSAL | Status: DC | PRN
  Administered 2012-02-09: 4 mL via TOPICAL

## 2012-02-09 MED ORDER — FENTANYL CITRATE 0.05 MG/ML IJ SOLN
100.0000 ug | Freq: Once | INTRAMUSCULAR | Status: AC
  Administered 2012-02-09: 100 ug via INTRAVENOUS

## 2012-02-09 MED ORDER — METOPROLOL TARTRATE 50 MG PO TABS
50.0000 mg | ORAL_TABLET | Freq: Every day | ORAL | Status: DC
  Administered 2012-02-10 – 2012-02-11 (×2): 50 mg via ORAL
  Filled 2012-02-09 (×2): qty 1

## 2012-02-09 MED ORDER — METOCLOPRAMIDE HCL 5 MG/ML IJ SOLN
5.0000 mg | Freq: Three times a day (TID) | INTRAMUSCULAR | Status: DC | PRN
  Administered 2012-02-09: 10 mg via INTRAVENOUS
  Filled 2012-02-09: qty 2

## 2012-02-09 MED ORDER — METHOCARBAMOL 100 MG/ML IJ SOLN
500.0000 mg | Freq: Four times a day (QID) | INTRAVENOUS | Status: DC | PRN
  Filled 2012-02-09: qty 5

## 2012-02-09 MED ORDER — PROPOFOL 10 MG/ML IV BOLUS
INTRAVENOUS | Status: DC | PRN
  Administered 2012-02-09: 150 mg via INTRAVENOUS

## 2012-02-09 MED ORDER — DEXAMETHASONE SODIUM PHOSPHATE 4 MG/ML IJ SOLN
INTRAMUSCULAR | Status: DC | PRN
  Administered 2012-02-09: 8 mg via INTRAVENOUS

## 2012-02-09 MED ORDER — BUPIVACAINE-EPINEPHRINE PF 0.5-1:200000 % IJ SOLN
INTRAMUSCULAR | Status: DC | PRN
  Administered 2012-02-09: 25 mL

## 2012-02-09 MED ORDER — BUPIVACAINE-EPINEPHRINE PF 0.25-1:200000 % IJ SOLN
INTRAMUSCULAR | Status: AC
  Filled 2012-02-09: qty 30

## 2012-02-09 MED ORDER — FENTANYL CITRATE 0.05 MG/ML IJ SOLN
INTRAMUSCULAR | Status: DC | PRN
  Administered 2012-02-09: 50 ug via INTRAVENOUS
  Administered 2012-02-09: 250 ug via INTRAVENOUS

## 2012-02-09 MED ORDER — METHOCARBAMOL 500 MG PO TABS
500.0000 mg | ORAL_TABLET | Freq: Four times a day (QID) | ORAL | Status: DC | PRN
  Administered 2012-02-11: 500 mg via ORAL
  Filled 2012-02-09 (×2): qty 1

## 2012-02-09 MED ORDER — MENTHOL 3 MG MT LOZG: 1.0000 | LOZENGE | OROMUCOSAL | Status: DC | PRN

## 2012-02-09 MED ORDER — GLYCOPYRROLATE 0.2 MG/ML IJ SOLN
INTRAMUSCULAR | Status: DC | PRN
  Administered 2012-02-09: .5 mg via INTRAVENOUS

## 2012-02-09 MED ORDER — BISACODYL 5 MG PO TBEC: 5.0000 mg | DELAYED_RELEASE_TABLET | Freq: Every day | ORAL | Status: DC | PRN

## 2012-02-09 MED ORDER — ATORVASTATIN CALCIUM 40 MG PO TABS
40.0000 mg | ORAL_TABLET | Freq: Every day | ORAL | Status: DC
  Filled 2012-02-09 (×5): qty 1

## 2012-02-09 MED ORDER — ACETAMINOPHEN 650 MG RE SUPP: 650.0000 mg | Freq: Four times a day (QID) | RECTAL | Status: DC | PRN

## 2012-02-09 MED ORDER — LACTATED RINGERS IV SOLN
INTRAVENOUS | Status: DC
  Administered 2012-02-09 (×2): via INTRAVENOUS

## 2012-02-09 MED ORDER — LIDOCAINE HCL (CARDIAC) 20 MG/ML IV SOLN
INTRAVENOUS | Status: DC | PRN
  Administered 2012-02-09: 100 mg via INTRAVENOUS

## 2012-02-09 MED ORDER — HYDROMORPHONE HCL PF 1 MG/ML IJ SOLN
0.2500 mg | INTRAMUSCULAR | Status: DC | PRN
  Administered 2012-02-09 (×4): 0.5 mg via INTRAVENOUS

## 2012-02-09 MED ORDER — METOCLOPRAMIDE HCL 10 MG PO TABS: 5.0000 mg | ORAL_TABLET | Freq: Three times a day (TID) | ORAL | Status: DC | PRN

## 2012-02-09 MED ORDER — HYDROMORPHONE HCL PF 1 MG/ML IJ SOLN
INTRAMUSCULAR | Status: AC
  Filled 2012-02-09: qty 1

## 2012-02-09 MED ORDER — BUPIVACAINE 0.25 % ON-Q PUMP SINGLE CATH 300ML
300.0000 mL | INJECTION | Status: DC
  Filled 2012-02-09: qty 300

## 2012-02-09 MED ORDER — BENAZEPRIL HCL 20 MG PO TABS
20.0000 mg | ORAL_TABLET | Freq: Every day | ORAL | Status: DC
  Administered 2012-02-09 – 2012-02-11 (×3): 20 mg via ORAL
  Filled 2012-02-09 (×4): qty 1

## 2012-02-09 MED ORDER — ONDANSETRON HCL 4 MG/2ML IJ SOLN: 4.0000 mg | Freq: Four times a day (QID) | INTRAMUSCULAR | Status: DC | PRN

## 2012-02-09 MED ORDER — SENNOSIDES-DOCUSATE SODIUM 8.6-50 MG PO TABS: 1.0000 | ORAL_TABLET | Freq: Every evening | ORAL | Status: DC | PRN

## 2012-02-09 MED ORDER — ACETAMINOPHEN 10 MG/ML IV SOLN
1000.0000 mg | Freq: Four times a day (QID) | INTRAVENOUS | Status: DC
  Administered 2012-02-09 – 2012-02-10 (×3): 1000 mg via INTRAVENOUS
  Filled 2012-02-09 (×4): qty 100

## 2012-02-09 MED ORDER — ONDANSETRON HCL 4 MG/2ML IJ SOLN
4.0000 mg | Freq: Once | INTRAMUSCULAR | Status: AC | PRN
  Administered 2012-02-09: 4 mg via INTRAVENOUS

## 2012-02-09 MED ORDER — ENOXAPARIN SODIUM 30 MG/0.3ML ~~LOC~~ SOLN
30.0000 mg | Freq: Two times a day (BID) | SUBCUTANEOUS | Status: DC
  Administered 2012-02-10 – 2012-02-11 (×3): 30 mg via SUBCUTANEOUS
  Filled 2012-02-09 (×5): qty 0.3

## 2012-02-09 MED ORDER — BUPIVACAINE 0.25 % ON-Q PUMP SINGLE CATH 300ML
INJECTION | Status: DC | PRN
  Administered 2012-02-09: 300 mL

## 2012-02-09 MED ORDER — OXYCODONE HCL 5 MG PO TABS
5.0000 mg | ORAL_TABLET | ORAL | Status: DC | PRN
  Administered 2012-02-09 – 2012-02-11 (×3): 10 mg via ORAL
  Filled 2012-02-09 (×3): qty 2

## 2012-02-09 MED ORDER — FLEET ENEMA 7-19 GM/118ML RE ENEM: 1.0000 | ENEMA | Freq: Once | RECTAL | Status: AC | PRN

## 2012-02-09 MED ORDER — DIPHENHYDRAMINE HCL 12.5 MG/5ML PO ELIX: 12.5000 mg | ORAL_SOLUTION | ORAL | Status: DC | PRN

## 2012-02-09 MED ORDER — CEFAZOLIN SODIUM 1-5 GM-% IV SOLN
INTRAVENOUS | Status: DC | PRN
  Administered 2012-02-09: 2 g via INTRAVENOUS

## 2012-02-09 MED ORDER — PNEUMOCOCCAL VAC POLYVALENT 25 MCG/0.5ML IJ INJ
0.5000 mL | INJECTION | INTRAMUSCULAR | Status: AC
  Filled 2012-02-09: qty 0.5

## 2012-02-09 MED ORDER — SODIUM CHLORIDE 0.9 % IV SOLN: INTRAVENOUS | Status: DC

## 2012-02-09 MED ORDER — EPHEDRINE SULFATE 50 MG/ML IJ SOLN
INTRAMUSCULAR | Status: DC | PRN
  Administered 2012-02-09: 10 mg via INTRAVENOUS
  Administered 2012-02-09: 15 mg via INTRAVENOUS
  Administered 2012-02-09: 10 mg via INTRAVENOUS
  Administered 2012-02-09: 15 mg via INTRAVENOUS

## 2012-02-09 MED ORDER — SODIUM CHLORIDE 0.9 % IV SOLN
INTRAVENOUS | Status: DC
  Administered 2012-02-09: 12:00:00 via INTRAVENOUS

## 2012-02-09 MED ORDER — ONDANSETRON HCL 4 MG PO TABS: 4.0000 mg | ORAL_TABLET | Freq: Four times a day (QID) | ORAL | Status: DC | PRN

## 2012-02-09 MED ORDER — ZOLPIDEM TARTRATE 5 MG PO TABS: 5.0000 mg | ORAL_TABLET | Freq: Every evening | ORAL | Status: DC | PRN

## 2012-02-09 MED ORDER — ACETAMINOPHEN 325 MG PO TABS: 650.0000 mg | ORAL_TABLET | Freq: Four times a day (QID) | ORAL | Status: DC | PRN

## 2012-02-09 MED ORDER — NEOSTIGMINE METHYLSULFATE 1 MG/ML IJ SOLN
INTRAMUSCULAR | Status: DC | PRN
  Administered 2012-02-09: 3 mg via INTRAVENOUS

## 2012-02-09 MED ORDER — DOCUSATE SODIUM 100 MG PO CAPS
100.0000 mg | ORAL_CAPSULE | Freq: Two times a day (BID) | ORAL | Status: DC
  Administered 2012-02-09 – 2012-02-11 (×5): 100 mg via ORAL
  Filled 2012-02-09 (×6): qty 1

## 2012-02-09 MED ORDER — ONDANSETRON HCL 4 MG/2ML IJ SOLN
INTRAMUSCULAR | Status: AC
  Filled 2012-02-09: qty 2

## 2012-02-09 MED ORDER — ACETAMINOPHEN 10 MG/ML IV SOLN
1000.0000 mg | Freq: Four times a day (QID) | INTRAVENOUS | Status: DC
  Administered 2012-02-09: 1000 mg via INTRAVENOUS

## 2012-02-09 MED ORDER — SODIUM CHLORIDE 0.9 % IR SOLN
Status: DC | PRN
  Administered 2012-02-09: 3000 mL

## 2012-02-09 MED ORDER — PHENOL 1.4 % MT LIQD: 1.0000 | OROMUCOSAL | Status: DC | PRN

## 2012-02-09 MED ORDER — ACETAMINOPHEN 10 MG/ML IV SOLN
INTRAVENOUS | Status: AC
  Filled 2012-02-09: qty 100

## 2012-02-09 MED ORDER — PROMETHAZINE HCL 25 MG/ML IJ SOLN
25.0000 mg | Freq: Four times a day (QID) | INTRAMUSCULAR | Status: DC | PRN
  Administered 2012-02-09: 25 mg via INTRAVENOUS
  Filled 2012-02-09: qty 1

## 2012-02-09 MED ORDER — HYDROMORPHONE HCL PF 1 MG/ML IJ SOLN: 1.0000 mg | INTRAMUSCULAR | Status: DC | PRN

## 2012-02-09 MED ORDER — FENTANYL CITRATE 0.05 MG/ML IJ SOLN
INTRAMUSCULAR | Status: AC
  Filled 2012-02-09: qty 2

## 2012-02-09 MED ORDER — CEFAZOLIN SODIUM 1-5 GM-% IV SOLN: 1.0000 g | Freq: Three times a day (TID) | INTRAVENOUS | Status: DC

## 2012-02-09 MED ORDER — BUPIVACAINE-EPINEPHRINE 0.25% -1:200000 IJ SOLN
INTRAMUSCULAR | Status: DC | PRN
  Administered 2012-02-09: 20 mL

## 2012-02-09 MED ORDER — CELECOXIB 200 MG PO CAPS
200.0000 mg | ORAL_CAPSULE | Freq: Two times a day (BID) | ORAL | Status: DC
  Administered 2012-02-09 – 2012-02-11 (×5): 200 mg via ORAL
  Filled 2012-02-09 (×7): qty 1

## 2012-02-09 MED ORDER — CEFAZOLIN SODIUM-DEXTROSE 2-3 GM-% IV SOLR
2.0000 g | Freq: Four times a day (QID) | INTRAVENOUS | Status: AC
  Administered 2012-02-09 (×2): 2 g via INTRAVENOUS
  Filled 2012-02-09 (×2): qty 50

## 2012-02-09 SURGICAL SUPPLY — 60 items
BAG URINE DRAINAGE (UROLOGICAL SUPPLIES) ×2 IMPLANT
BANDAGE ESMARK 6X9 LF (GAUZE/BANDAGES/DRESSINGS) ×1 IMPLANT
BLADE SAW SGTL 83.5X18.5 (BLADE) ×2 IMPLANT
BLADE SAW SGTL NAR THIN XSHT (BLADE) ×2 IMPLANT
BNDG ESMARK 6X9 LF (GAUZE/BANDAGES/DRESSINGS) ×2
BOWL SMART MIX CTS (DISPOSABLE) ×2 IMPLANT
CLOTH BEACON ORANGE TIMEOUT ST (SAFETY) ×2 IMPLANT
CONT SPEC 4OZ CLIKSEAL STRL BL (MISCELLANEOUS) ×4 IMPLANT
COVER BACK TABLE 24X17X13 BIG (DRAPES) IMPLANT
COVER SURGICAL LIGHT HANDLE (MISCELLANEOUS) ×2 IMPLANT
CUFF TOURNIQUET SINGLE 34IN LL (TOURNIQUET CUFF) IMPLANT
DRAPE EXTREMITY T 121X128X90 (DRAPE) ×2 IMPLANT
DRAPE INCISE IOBAN 66X45 STRL (DRAPES) ×4 IMPLANT
DRAPE PROXIMA HALF (DRAPES) ×2 IMPLANT
DRAPE U-SHAPE 47X51 STRL (DRAPES) ×2 IMPLANT
DRSG ADAPTIC 3X8 NADH LF (GAUZE/BANDAGES/DRESSINGS) ×2 IMPLANT
DRSG PAD ABDOMINAL 8X10 ST (GAUZE/BANDAGES/DRESSINGS) ×2 IMPLANT
DURAPREP 26ML APPLICATOR (WOUND CARE) ×4 IMPLANT
ELECT REM PT RETURN 9FT ADLT (ELECTROSURGICAL) ×2
ELECTRODE REM PT RTRN 9FT ADLT (ELECTROSURGICAL) ×1 IMPLANT
EVACUATOR 1/8 PVC DRAIN (DRAIN) ×2 IMPLANT
GEL ULTRASOUND 20GR AQUASONIC (MISCELLANEOUS) ×2 IMPLANT
GLOVE BIOGEL PI IND STRL 7.5 (GLOVE) IMPLANT
GLOVE BIOGEL PI IND STRL 8.5 (GLOVE) ×2 IMPLANT
GLOVE BIOGEL PI INDICATOR 7.5 (GLOVE)
GLOVE BIOGEL PI INDICATOR 8.5 (GLOVE) ×2
GLOVE SURG ORTHO 7.0 STRL STRW (GLOVE) IMPLANT
GLOVE SURG ORTHO 8.0 STRL STRW (GLOVE) ×4 IMPLANT
GOWN PREVENTION PLUS XLARGE (GOWN DISPOSABLE) ×4 IMPLANT
GOWN STRL NON-REIN LRG LVL3 (GOWN DISPOSABLE) ×4 IMPLANT
HANDPIECE INTERPULSE COAX TIP (DISPOSABLE) ×1
HOOD PEEL AWAY FACE SHEILD DIS (HOOD) ×6 IMPLANT
INSERT LCL COMP RP STD 15.0MM (Knees) ×2 IMPLANT
KIT BASIN OR (CUSTOM PROCEDURE TRAY) ×2 IMPLANT
KIT ROOM TURNOVER OR (KITS) ×2 IMPLANT
MANIFOLD NEPTUNE II (INSTRUMENTS) ×2 IMPLANT
NEEDLE 18GX1X1/2 (RX/OR ONLY) (NEEDLE) IMPLANT
NEEDLE HYPO 25GX1X1/2 BEV (NEEDLE) ×2 IMPLANT
NS IRRIG 1000ML POUR BTL (IV SOLUTION) ×2 IMPLANT
PACK TOTAL JOINT (CUSTOM PROCEDURE TRAY) ×2 IMPLANT
PAD ARMBOARD 7.5X6 YLW CONV (MISCELLANEOUS) ×4 IMPLANT
PADDING CAST COTTON 6X4 STRL (CAST SUPPLIES) ×2 IMPLANT
SET HNDPC FAN SPRY TIP SCT (DISPOSABLE) ×1 IMPLANT
SPONGE GAUZE 4X4 12PLY (GAUZE/BANDAGES/DRESSINGS) ×2 IMPLANT
STAPLER VISISTAT 35W (STAPLE) ×2 IMPLANT
SUCTION FRAZIER TIP 10 FR DISP (SUCTIONS) ×2 IMPLANT
SUT VIC AB 0 CTB1 27 (SUTURE) ×4 IMPLANT
SUT VIC AB 1 CT1 27 (SUTURE) ×4
SUT VIC AB 1 CT1 27XBRD ANBCTR (SUTURE) ×4 IMPLANT
SUT VIC AB 2-0 CT1 27 (SUTURE) ×2
SUT VIC AB 2-0 CT1 TAPERPNT 27 (SUTURE) ×2 IMPLANT
SYR 20CC LL (SYRINGE) ×4 IMPLANT
SYR 20ML ECCENTRIC (SYRINGE) IMPLANT
SYR CONTROL 10ML LL (SYRINGE) ×2 IMPLANT
SYRINGE 10CC LL (SYRINGE) ×2 IMPLANT
TOWEL OR 17X24 6PK STRL BLUE (TOWEL DISPOSABLE) ×2 IMPLANT
TOWEL OR 17X26 10 PK STRL BLUE (TOWEL DISPOSABLE) ×2 IMPLANT
TRAY FOLEY CATH 14FR (SET/KITS/TRAYS/PACK) ×2 IMPLANT
TUBE ANAEROBIC SPECIMEN COL (MISCELLANEOUS) IMPLANT
WATER STERILE IRR 1000ML POUR (IV SOLUTION) ×6 IMPLANT

## 2012-02-09 NOTE — Progress Notes (Signed)
UR COMPLETED  

## 2012-02-09 NOTE — Anesthesia Procedure Notes (Signed)
Anesthesia Regional Block:  Femoral nerve block  Pre-Anesthetic Checklist: ,, timeout performed, Correct Patient, Correct Site, Correct Laterality, Correct Procedure, Correct Position, site marked, Risks and benefits discussed,  Surgical consent,  Pre-op evaluation,  At surgeon's request and post-op pain management  Laterality: Right  Prep: Maximum Sterile Barrier Precautions used, chloraprep and alcohol swabs       Needles:  Injection technique: Single-shot  Needle Type: Stimulator Needle - 80      Needle Gauge: 7  Needle insertion depth: 7 cm   Additional Needles:  Procedures: nerve stimulator Femoral nerve block  Nerve Stimulator or Paresthesia:  Response: 0.5 mA, 0.1 ms, 7 cm  Additional Responses:   Narrative:  Start time: 02/09/2012 9:55 AM End time: 02/09/2012 10:02 AM Injection made incrementally with aspirations every 5 mL.  Performed by: Personally  Anesthesiologist: Maren Beach MD  Additional Notes: Pt accepts procedure w/ risks. 25 cc 0.5% Marcaine w/ epi w/o difficulty or discomfort. GES

## 2012-02-09 NOTE — Plan of Care (Signed)
Problem: Consults Goal: Diagnosis- Total Joint Replacement Primary Total Knee     

## 2012-02-09 NOTE — Anesthesia Postprocedure Evaluation (Signed)
  Anesthesia Post-op Note  Patient: Jeremiah Rodriguez  Procedure(s) Performed: Procedure(s) (LRB) with comments: EXCISIONAL TOTAL KNEE ARTHROPLASTY (Right) - polyexchange right total knee versus total knee revision  Patient Location: PACU  Anesthesia Type:GA combined with regional for post-op pain  Level of Consciousness: awake, alert , oriented and patient cooperative  Airway and Oxygen Therapy: Patient Spontanous Breathing  Post-op Pain: mild  Post-op Assessment: Post-op Vital signs reviewed, Patient's Cardiovascular Status Stable, Respiratory Function Stable, Patent Airway, No signs of Nausea or vomiting and Pain level controlled  Post-op Vital Signs: stable  Complications: No apparent anesthesia complications

## 2012-02-09 NOTE — Transfer of Care (Signed)
Immediate Anesthesia Transfer of Care Note  Patient: Jeremiah Rodriguez  Procedure(s) Performed: Procedure(s) (LRB) with comments: EXCISIONAL TOTAL KNEE ARTHROPLASTY (Right) - polyexchange right total knee versus total knee revision  Patient Location: PACU  Anesthesia Type:General  Level of Consciousness: awake, alert  and oriented  Airway & Oxygen Therapy: Patient Spontanous Breathing and Patient connected to nasal cannula oxygen  Post-op Assessment: Report given to PACU RN and Post -op Vital signs reviewed and stable  Post vital signs: Reviewed and stable  Complications: No apparent anesthesia complications

## 2012-02-09 NOTE — Evaluation (Signed)
Physical Therapy Evaluation Patient Details Name: Jeremiah Rodriguez MRN: [id number] DOB: [birth year] Today's Date: 02/09/2012 Time: 8657-8469 PT Time Calculation (min): 29 min  PT Assessment / Plan / Recommendation Clinical Impression  Patient is a 66 yo male s/p Rt. TKA revision.  Patient limited today by nausea.  Should progress well with PT.  Patient will benefit from acute PT to maximize independence prior to discharge.    PT Assessment  Patient needs continued PT services    Follow Up Recommendations  Home health PT;Supervision/Assistance - 24 hour (Initially recommend 24 hour assist)    Does the patient have the potential to tolerate intense rehabilitation      Barriers to Discharge Decreased caregiver support      Equipment Recommendations  None recommended by PT    Recommendations for Other Services     Frequency 7X/week    Precautions / Restrictions Precautions Precautions: Knee Restrictions Weight Bearing Restrictions: Yes RLE Weight Bearing: Weight bearing as tolerated   Pertinent Vitals/Pain       Mobility  Bed Mobility Bed Mobility: Supine to Sit;Sitting - Scoot to Edge of Bed;Sit to Supine Supine to Sit: 4: Min assist;With rails;HOB elevated Sitting - Scoot to Edge of Bed: 4: Min assist Sit to Supine: 4: Min assist;With rail;HOB elevated Details for Bed Mobility Assistance: Verbal cues for technique.  Assist to move RLE off/onto bed. Transfers Transfers: Sit to Stand;Stand to Sit Sit to Stand: 3: Mod assist;With upper extremity assist;From bed Stand to Sit: 4: Min assist;With upper extremity assist;To bed Details for Transfer Assistance: Verbal cues for technique and hand placement.  Patient stood x2 for 4 minutes each attempting to void - unsuccessful.  RN notified. Ambulation/Gait Ambulation/Gait Assistance: Not tested (comment)           PT Diagnosis: Difficulty walking;Acute pain  PT Problem List: Decreased strength;Decreased range of  motion;Decreased activity tolerance;Decreased mobility;Decreased knowledge of use of DME;Decreased knowledge of precautions;Pain PT Treatment Interventions: DME instruction;Gait training;Stair training;Functional mobility training;Therapeutic exercise;Patient/family education   PT Goals Acute Rehab PT Goals PT Goal Formulation: With patient Time For Goal Achievement: 02/16/12 Potential to Achieve Goals: Good Pt will go Sit to Supine/Side: Independently;with HOB 0 degrees PT Goal: Sit to Supine/Side - Progress: Goal set today Pt will go Sit to Stand: with supervision;with upper extremity assist PT Goal: Sit to Stand - Progress: Goal set today Pt will Ambulate: 51 - 150 feet;with supervision;with rolling walker PT Goal: Ambulate - Progress: Goal set today Pt will Perform Home Exercise Program: with supervision, verbal cues required/provided PT Goal: Perform Home Exercise Program - Progress: Goal set today  Visit Information  Last PT Received On: 02/09/12 Assistance Needed: +1    Subjective Data  Subjective: "I wish I could pee" Patient Stated Goal: To be able to go home soon   Prior Functioning  Home Living Lives With: Spouse Available Help at Discharge: Family;Available PRN/intermittently Type of Home: House Home Access: Stairs to enter [employer] of Steps: 2 Entrance Stairs-Rails: Right;Left Home Layout: One level Bathroom Shower/Tub: Forensic scientist: Standard Bathroom Accessibility: Yes How Accessible: Accessible via walker Home Adaptive Equipment: Bedside commode/3-in-1;Shower chair with back;Walker - rolling;Crutches;Straight cane Prior Function Level of Independence: Independent Able to Take Stairs?: Yes Driving: Yes Vocation: Retired Musician: No difficulties Dominant Hand: Left    Cognition  Overall Cognitive Status: Appears within functional limits for tasks assessed/performed Arousal/Alertness:  Awake/alert Orientation Level: Oriented X4 / Intact Behavior During Session: [facility] for tasks performed  Extremity/Trunk Assessment Right Upper Extremity Assessment RUE ROM/Strength/Tone: Within functional levels RUE Sensation: WFL - Light Touch Left Upper Extremity Assessment LUE ROM/Strength/Tone: Within functional levels LUE Sensation: WFL - Light Touch Right Lower Extremity Assessment RLE ROM/Strength/Tone: Deficits;Unable to fully assess;Due to pain RLE ROM/Strength/Tone Deficits: Grossly 3-/5 Left Lower Extremity Assessment LLE ROM/Strength/Tone: Within functional levels LLE Sensation: WFL - Light Touch Trunk Assessment Trunk Assessment: Normal   Balance    End of Session PT - End of Session Equipment Utilized During Treatment: Gait belt;Oxygen Activity Tolerance: Patient limited by fatigue;Treatment limited secondary to medical complications (Comment) (Limited by nausea) Patient left: in bed;with call bell/phone within reach Nurse Communication: Mobility status CPM Right Knee CPM Right Knee: Off Right Knee Flexion (Degrees): 90  Right Knee Extension (Degrees): 0  Additional Comments: pt completed 4 hours  GP     Vena Austria 02/09/2012, 6:13 PM Durenda Hurt. Renaldo Fiddler, [facility] Acute Rehab Services Pager [phone]

## 2012-02-09 NOTE — Progress Notes (Signed)
Orthopedic Tech Progress Note Patient Details:  Jeremiah Rodriguez 1945/12/29 [id number] CPM applied to Right LE with appropriate settings; OHF applied to bed. CPM Right Knee CPM Right Knee: On Right Knee Flexion (Degrees): 90  Right Knee Extension (Degrees): 0    [name] 02/09/2012, 12:58 PM

## 2012-02-09 NOTE — H&P (Signed)
Jeremiah Rodriguez MRN:  [id number] DOB/SEX:  [birth year]/male  CHIEF COMPLAINT:  Painful right Knee  HISTORY: Patient is a 67 y.o. male presented with a history of pain in the right knee. Onset of symptoms was abrupt starting a few months ago with gradually worsening course since that time. The patient noted no past surgery on the right knee. Prior procedures on the knee include arthroplasty. Patient has been treated conservatively with over-the-counter NSAIDs and activity modification. Patient currently rates pain in the knee at 10 out of 10 with activity. There is pain at night.  PAST MEDICAL HISTORY: There are no active problems to display for this patient.  Past Medical History  Diagnosis Date  . Complication of anesthesia     lungs collapsed;b/p drops  . PONV (postoperative nausea and vomiting)   . Hypertension     takes Lotensin and Metoprolol daily  . Hyperlipidemia     takes Crestor daily  . Coronary artery disease   . Arthritis   . Joint pain   . Joint swelling   . GERD (gastroesophageal reflux disease)     OTC Pepcid prn  . Hx of Lyme disease   . Urinary frequency   . Nocturia   . Enlarged prostate   . Depression     but doesn't require meds for this   Past Surgical History  Procedure Date  . Facial cosmetic surgery     at age 8  . Joint replacement 2012    right knee  . Nodule on groin removed 1980  . Nodule removed from thyroid  2003  . 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 Cholecystectomy 2011  . Knee arthroscopy 2012    right   . Transurethral resection of prostate   . Cardiac catheterization 2006     MEDICATIONS:   No prescriptions prior to admission    ALLERGIES:  No Known Allergies  REVIEW OF SYSTEMS:  Pertinent items are noted in HPI.   FAMILY HISTORY:  No family history on file.  SOCIAL HISTORY:   History  Substance Use Topics  . Smoking status: Not on file  . Smokeless tobacco: Not on file     Comment: quit smoking in  1974  . Alcohol Use: No     Comment: quit in 1974     EXAMINATION:  Vital signs in last 24 hours:    General appearance: alert, cooperative and no distress Lungs: clear to auscultation bilaterally Heart: regular rate and rhythm, S1, S2 normal, no murmur, click, rub or gallop Abdomen: soft, non-tender; bowel sounds normal; no masses,  no organomegaly Extremities: extremities normal, atraumatic, no cyanosis or edema and Homans sign is negative, no sign of DVT Pulses: 2+ and symmetric Skin: Skin color, texture, turgor normal. No rashes or lesions Neurologic: Alert and oriented X 3, normal strength and tone. Normal symmetric reflexes. Normal coordination and gait  Musculoskeletal:  ROM 0-90, Ligaments intact,  Imaging Review Plain radiographs demonstrate components in allignmentof the right knee. The overall alignment is neutral. The bone quality appears to be good for age and reported activity level.  Assessment/Plan:  right knee pain s/p replacement one year  The patient history, physical examination and imaging studies are consistent with right knee pain after replacement. The patient has failed conservative treatment.  The clearance notes were reviewed.  After discussion with the patient it was felt that Total Knee Revision vs polyexchange was indicated. The procedure,  risks, and benefits of total  knee arthroplasty were presented and reviewed. The risks including but not limited to aseptic loosening, infection, blood clots, vascular injury, stiffness, patella tracking problems complications among others were discussed. The patient acknowledged the explanation, agreed to proceed with the plan.  Kalley Nicholl 02/09/2012, 7:16 AM

## 2012-02-09 NOTE — Preoperative (Signed)
Beta Blockers   Reason not to administer Beta Blockers:Not Applicable 

## 2012-02-09 NOTE — Anesthesia Preprocedure Evaluation (Addendum)
Anesthesia Evaluation  Patient identified by MRN, date of birth, ID band Patient awake    Reviewed: Allergy & Precautions, H&P , NPO status , Patient's Chart, lab work & pertinent test results  History of Anesthesia Complications (+) PONV  Airway Mallampati: I TM Distance: >3 FB Neck ROM: full    Dental   Pulmonary          Cardiovascular Exercise Tolerance: Good hypertension, + CAD and + CABG Rhythm:regular Rate:Normal     Neuro/Psych PSYCHIATRIC DISORDERS Depression    GI/Hepatic GERD-  ,  Endo/Other    Renal/GU      Musculoskeletal   Abdominal   Peds  Hematology   Anesthesia Other Findings   Reproductive/Obstetrics                          Anesthesia Physical Anesthesia Plan  ASA: III  Anesthesia Plan: General   Post-op Pain Management:    Induction: Intravenous  Airway Management Planned: Oral ETT  Additional Equipment:   Intra-op Plan:   Post-operative Plan: Extubation in OR  Informed Consent: I have reviewed the patients History and Physical, chart, labs and discussed the procedure including the risks, benefits and alternatives for the proposed anesthesia with the patient or authorized representative who has indicated his/her understanding and acceptance.     Plan Discussed with: CRNA, Anesthesiologist and Surgeon  Anesthesia Plan Comments:         Anesthesia Quick Evaluation

## 2012-02-10 ENCOUNTER — Encounter (HOSPITAL_COMMUNITY): Payer: Self-pay | Admitting: Orthopedic Surgery

## 2012-02-10 LAB — CBC
HCT: 35.8 % — ABNORMAL LOW (ref 39.0–52.0)
Hemoglobin: 12.2 g/dL — ABNORMAL LOW (ref 13.0–17.0)
RDW: 13 % (ref 11.5–15.5)
WBC: 11.8 10*3/uL — ABNORMAL HIGH (ref 4.0–10.5)

## 2012-02-10 LAB — BASIC METABOLIC PANEL
BUN: 16 mg/dL (ref 6–23)
Chloride: 101 mEq/L (ref 96–112)
GFR calc Af Amer: 79 mL/min — ABNORMAL LOW (ref >90)
Glucose, Bld: 147 mg/dL — ABNORMAL HIGH (ref 70–99)
Potassium: 4.4 mEq/L (ref 3.5–5.1)
Sodium: 136 mEq/L (ref 135–145)

## 2012-02-10 NOTE — Progress Notes (Signed)
I agree with the following treatment note after reviewing documentation.   Johnston, Italo Banton Brynn   OTR/L Pager: [phone] Office: [phone] .   

## 2012-02-10 NOTE — Progress Notes (Signed)
Physical Therapy Treatment Patient Details Name: Jeremiah Rodriguez MRN: [id number] DOB: [birth year] Today's Date: 02/10/2012 Time: 4696-2952 PT Time Calculation (min): 28 min  PT Assessment / Plan / Recommendation Comments on Treatment Session  Patient able to complete stair training this session and progress with long hall ambulation. Buckling of R knee is more controlled at this time. Patient is safe to return home at this time from therapy goals and standpoint, as he has assistance at home.     Follow Up Recommendations  Home health PT;Supervision/Assistance - 24 hour     Does the patient have the potential to tolerate intense rehabilitation     Barriers to Discharge        Equipment Recommendations  None recommended by PT    Recommendations for Other Services    Frequency 7X/week   Plan Discharge plan remains appropriate;Frequency remains appropriate    Precautions / Restrictions Precautions Precautions: Knee Restrictions Weight Bearing Restrictions: Yes RLE Weight Bearing: Weight bearing as tolerated   Pertinent Vitals/Pain     Mobility  Bed Mobility Bed Mobility: Not assessed Supine to Sit: 4: Min assist Sitting - Scoot to Edge of Bed: 5: Supervision Details for Bed Mobility Assistance: A for R LE out of bed.  Transfers Sit to Stand: 5: Supervision;With upper extremity assist;From chair/3-in-1 Stand to Sit: 5: Supervision;With upper extremity assist;To chair/3-in-1 Details for Transfer Assistance: supervision for safety Ambulation/Gait Ambulation/Gait Assistance: 4: Min guard Ambulation Distance (Feet): 220 Feet Assistive device: Rolling walker Ambulation/Gait Assistance Details: Patient with improved gait this session. No noted buckling of R LE until patient began fatiquing which at that point he sat down. Patient progressing well with ambulation and not relying as heavily on RW this session Gait Pattern: Decreased step length - right Gait velocity:  decreased Stairs: Yes Stairs Assistance: 4: Min guard Stairs Assistance Details (indicate cue type and reason): Patient able to recall technique Stair Management Technique: Two rails;Step to pattern;Backwards;Forwards Number of Stairs: 4     Exercises Total Joint Exercises Ankle Circles/Pumps: AROM;Both;15 reps Quad Sets: AROM;Right;15 reps Short Arc QuadBarbaraann Boys;Right;10 reps Heel Slides: AAROM;Right;15 reps Hip ABduction/ADduction: AAROM;Right;15 reps Straight Leg Raises: AAROM;Right;10 reps Long Arc Quad: AAROM;Right;10 reps   PT Diagnosis:    PT Problem List:   PT Treatment Interventions:     PT Goals Acute Rehab PT Goals PT Goal: Sit to Supine/Side - Progress: Progressing toward goal PT Goal: Sit to Stand - Progress: Progressing toward goal PT Goal: Ambulate - Progress: Progressing toward goal PT Goal: Perform Home Exercise Program - Progress: Progressing toward goal  Visit Information  Last PT Received On: 02/10/12 Assistance Needed: +1    Subjective Data      Cognition  Overall Cognitive Status: Appears within functional limits for tasks assessed/performed Arousal/Alertness: Awake/alert Orientation Level: Appears intact for tasks assessed Behavior During Session: [facility] for tasks performed    Balance     End of Session PT - End of Session Equipment Utilized During Treatment: Gait belt Activity Tolerance: Patient tolerated treatment well Patient left: in chair;with call bell/phone within reach Nurse Communication: Mobility status   GP     Fredrich Birks 02/10/2012, 11:56 AM  02/10/2012 Fredrich Birks PTA [phone] pager [phone] office

## 2012-02-10 NOTE — Progress Notes (Signed)
Orthopedic Tech Progress Note Patient Details:  Jeremiah Rodriguez 11-16-1945 [id number]  Patient ID: [name], male   DOB: 1945/04/29, 66 y.o.   MRN: [id number] Adjusted cpm with pt's right leg on cpm @0 -60 degrees@1700 ;rn notified  Nikki Dom 02/10/2012, 7:10 PM

## 2012-02-10 NOTE — Evaluation (Signed)
I agree with the following treatment note after reviewing documentation.   Johnston, Nichol Ator Brynn   OTR/L Pager: [phone] Office: [phone] .   

## 2012-02-10 NOTE — Progress Notes (Signed)
Physical Therapy Treatment Patient Details Name: Jeremiah Rodriguez MRN: [id number] DOB: [birth year] Today's Date: 02/10/2012 Time: 7829-5621 PT Time Calculation (min): 31 min  PT Assessment / Plan / Recommendation Comments on Treatment Session  Patient progressing well. Still having an issue with R knee buckling but is progressing nicely. Will attempt long ambulation and possibly stairs this afternoon as patient is a possibly discharge this afternoon.     Follow Up Recommendations  Home health PT;Supervision/Assistance - 24 hour     Does the patient have the potential to tolerate intense rehabilitation     Barriers to Discharge        Equipment Recommendations  None recommended by PT    Recommendations for Other Services    Frequency 7X/week   Plan Discharge plan remains appropriate;Frequency remains appropriate    Precautions / Restrictions Precautions Precautions: Knee Restrictions RLE Weight Bearing: Weight bearing as tolerated   Pertinent Vitals/Pain 5/10 R knee pain. RN aware    Mobility  Bed Mobility Supine to Sit: 4: Min assist Sitting - Scoot to Edge of Bed: 5: Supervision Details for Bed Mobility Assistance: A for R LE out of bed.  Transfers Sit to Stand: 5: Supervision;With upper extremity assist;From bed Stand to Sit: 5: Supervision;With upper extremity assist;To chair/3-in-1 Details for Transfer Assistance: S for safety. Cues to control descent into recliner Ambulation/Gait Ambulation/Gait Assistance: 4: Min assist Ambulation Distance (Feet): 120 Feet Assistive device: Rolling walker Ambulation/Gait Assistance Details: A for balance as patient intially having difficulty with R knee buckling. This did improve as ambulation increased. Cues to heel strike and knee extend in stance phase. Patient relying heavily on UEs at this time Gait Pattern: Step-through pattern;Decreased stride length Gait velocity: decreased    Exercises Total Joint Exercises Ankle  Circles/Pumps: AROM;Both;15 reps Quad Sets: AROM;Right;15 reps Short Arc QuadBarbaraann Rodriguez;Right;10 reps Heel Slides: AAROM;Right;15 reps Hip ABduction/ADduction: AAROM;Right;15 reps Straight Leg Raises: AAROM;Right;10 reps   PT Diagnosis:    PT Problem List:   PT Treatment Interventions:     PT Goals Acute Rehab PT Goals PT Goal: Sit to Supine/Side - Progress: Progressing toward goal PT Goal: Sit to Stand - Progress: Met PT Goal: Ambulate - Progress: Progressing toward goal PT Goal: Perform Home Exercise Program - Progress: Progressing toward goal  Visit Information  Last PT Received On: 02/10/12 Assistance Needed: +1    Subjective Data      Cognition  Overall Cognitive Status: Appears within functional limits for tasks assessed/performed Arousal/Alertness: Awake/alert Orientation Level: Appears intact for tasks assessed Behavior During Session: Jeremiah Rodriguez for tasks performed    Balance     End of Session PT - End of Session Equipment Utilized During Treatment: Gait belt Activity Tolerance: Patient tolerated treatment well Patient left: in chair;with call bell/phone within reach Nurse Communication: Mobility status   GP     Jeremiah Rodriguez 02/10/2012, 10:12 AM 02/10/2012 Jeremiah Rodriguez PTA [phone] pager [phone] office

## 2012-02-10 NOTE — Progress Notes (Signed)
SPORTS MEDICINE AND JOINT REPLACEMENT  Georgena Spurling, MD   Altamese Cabal, PA-C [address] [place], [place], [name]  [address]                             [phone]   PROGRESS NOTE  Subjective:  negative for Chest Pain  negative for Shortness of Breath  negative for Nausea/Vomiting   negative for Calf Pain  negative for Bowel Movement   Tolerating Diet: yes         Patient reports pain as 2 on 0-10 scale.    Objective: Vital signs in last 24 hours:   Patient Vitals for the past 24 hrs:  BP Temp Temp src Pulse Resp SpO2  02/10/12 0623 136/78 mmHg 98.7 F (37.1 C) - 74  18  96 %  02/09/12 2000 134/76 mmHg 98.6 F (37 C) - 75  18  95 %  02/09/12 1728 - - - - - 94 %  02/09/12 1703 129/72 mmHg 98.1 F (36.7 C) - 77  18  98 %  02/09/12 1600 - - - - 13  98 %  02/09/12 1323 - - - 77  13  98 %  02/09/12 1315 131/72 mmHg 97.5 F (36.4 C) - 78  14  93 %  02/09/12 1307 - - - 77  10  93 %  02/09/12 1300 129/66 mmHg - - 77  14  93 %  02/09/12 1245 131/66 mmHg - - 78  16  100 %  02/09/12 1230 136/65 mmHg - - 80  14  100 %  02/09/12 1215 128/71 mmHg - - 81  8  98 %  02/09/12 1208 124/63 mmHg 97.6 F (36.4 C) - - - -  02/09/12 1010 - - - 71  20  100 %  02/09/12 1009 - - - 67  20  100 %  02/09/12 1002 - - - 67  20  100 %  02/09/12 1001 - - - 69  20  100 %  02/09/12 1000 - - - 68  20  100 %  02/09/12 0809 129/83 mmHg 98.3 F (36.8 C) Oral 62  18  97 %    @flow {1959:LAST@   Intake/Output from previous day:   11/04 0701 - 11/05 0700 In: 3129.5 [P.O.:360; I.V.:2562.5] Out: 1375 [Urine:1100; Drains:275]   Intake/Output this shift:       Intake/Output      11/04 0701 - 11/05 0700 11/05 0701 - 11/06 0700   P.O. 360    I.V. 2562.5    IV Piggyback 207    Total Intake 3129.5    Urine 1100    Drains 275    Total Output 1375    Net +1754.5            LABORATORY DATA:  Basename 02/10/12 0600 02/03/12 0823  WBC 11.8* 6.2  HGB 12.2* 13.8  HCT 35.8* 40.4  PLT 213 253      Basename 02/10/12 0600 02/03/12 0823  NA 136 139  K 4.4 4.0  CL 101 103  CO2 26 29  BUN 16 11  CREATININE 1.09 1.13  GLUCOSE 147* 97  CALCIUM 9.0 9.8   Lab Results  Component Value Date   INR 0.92 02/03/2012   INR 1.04 12/13/2010    Examination:  General appearance: alert, cooperative and no distress Extremities: Homans sign is negative, no sign of DVT  Wound Exam: clean, dry, intact  Drainage:  Scant/small amount Serosanguinous exudate  Motor Exam: EHL and FHL Intact  Sensory Exam: Deep Peroneal normal  Vascular Exam:    Assessment:    1 Day Post-Op  Procedure(s) (LRB): EXCISIONAL TOTAL KNEE ARTHROPLASTY (Right)  ADDITIONAL DIAGNOSIS:  Active Problems:  * No active hospital problems. *   Acute Blood Loss Anemia   Plan: Physical Therapy as ordered Weight Bearing as Tolerated (WBAT)  DVT Prophylaxis:  Lovenox  DISCHARGE PLAN: Home  DISCHARGE NEEDS: HHPT, CPM, Walker and 3-in-1 comode seat         Clancey Welton 02/10/2012, 7:32 AM

## 2012-02-10 NOTE — Evaluation (Signed)
Occupational Therapy Evaluation Patient Details Name: Jeremiah Rodriguez MRN: [id number] DOB: October 16, 1945 Today's Date: 02/10/2012 Time: 4098-1191 OT Time Calculation (min): 17 min  OT Assessment / Plan / Recommendation Clinical Impression  Pt. 66 yo male s/p right TKA revision. Overall pt is doing very well, able to perform ADL's at supervision level. Feel pt is at safe level to d/c home. OT signing off.     OT Assessment  Patient does not need any further OT services    Follow Up Recommendations  Supervision - Intermittent    Barriers to Discharge      Equipment Recommendations  None recommended by OT    Recommendations for Other Services    Frequency       Precautions / Restrictions Precautions Precautions: Knee Restrictions Weight Bearing Restrictions: Yes RLE Weight Bearing: Weight bearing as tolerated   Pertinent Vitals/Pain Pain 6/10   ADL  Upper Body Dressing: Performed;Supervision/safety Where Assessed - Upper Body Dressing: Unsupported sitting Lower Body Dressing: Performed;Supervision/safety Where Assessed - Lower Body Dressing: Supported sit to stand Toilet Transfer: Buyer, retail Method: Sit to Barista: Other (comment) (chair ) Tub/Shower Transfer: Engineer, manufacturing Method: Science writer: Information systems manager with back Equipment Used: Gait belt;Rolling walker Transfers/Ambulation Related to ADLs: Supervision level during transfers and ambulation for safety ADL Comments: Pt getting dressed upon arrival. Able to dress UB/LB at supervision level. Educated on proper technique to dress operated LE first. Pt simulated toilet transfer from chair with supervision. Pushed pt in recliner down to ortho gym to perform tub transfer using a shower chair. Able to complete at supervision level with one vc for hand placement when completing sit<>stand from shower chair.       OT Diagnosis:    OT Problem List:   OT Treatment Interventions:     OT Goals    Visit Information  Last OT Received On: 02/10/12 Assistance Needed: +1 PT/OT Co-Evaluation/Treatment: Yes (Partial)    Subjective Data  Subjective: I have been through this before Patient Stated Goal: To go home    Prior Functioning     Home Living Lives With: Spouse Available Help at Discharge: Family;Available PRN/intermittently Type of Home: House Home Access: Stairs to enter [employer] of Steps: 2 Entrance Stairs-Rails: Right;Left Home Layout: One level Bathroom Shower/Tub: Forensic scientist: Standard Bathroom Accessibility: Yes How Accessible: Accessible via walker Home Adaptive Equipment: Bedside commode/3-in-1;Shower chair with back;Walker - rolling;Crutches;Straight cane Prior Function Level of Independence: Independent Able to Take Stairs?: Yes Driving: Yes Vocation: Retired Musician: No difficulties Dominant Hand: Left         Vision/Perception     Cognition  Overall Cognitive Status: Appears within functional limits for tasks assessed/performed Arousal/Alertness: Awake/alert Orientation Level: Appears intact for tasks assessed Behavior During Session: [facility] for tasks performed    Extremity/Trunk Assessment Right Upper Extremity Assessment RUE ROM/Strength/Tone: Within functional levels Left Upper Extremity Assessment LUE ROM/Strength/Tone: Within functional levels     Mobility Bed Mobility Bed Mobility: Not assessed Supine to Sit: 4: Min assist Sitting - Scoot to Edge of Bed: 5: Supervision Details for Bed Mobility Assistance: A for R LE out of bed.  Transfers Transfers: Sit to Stand;Stand to Sit Sit to Stand: 5: Supervision;With upper extremity assist;From chair/3-in-1 Stand to Sit: 5: Supervision;With upper extremity assist;To chair/3-in-1 Details for Transfer Assistance: supervision for safety           Exercise Total Joint Exercises Ankle Circles/Pumps: AROM;Both;15 reps Quad Sets: AROM;Right;15  reps Short Arc Quad: AAROM;Right;10 reps Heel Slides: AAROM;Right;15 reps Hip ABduction/ADduction: AAROM;Right;15 reps Straight Leg Raises: AAROM;Right;10 reps   Balance     End of Session OT - End of Session Equipment Utilized During Treatment: Gait belt Activity Tolerance: Patient tolerated treatment well Patient left: Other (comment) (with PT ambulating in hallway) Nurse Communication: Mobility status CPM Right Knee CPM Right Knee: Off  GO     [name] 02/10/2012, 11:40 AM

## 2012-02-10 NOTE — Progress Notes (Signed)
Orthopedic Tech Progress Note Patient Details:  Jeremiah Rodriguez 1945/09/29 [id number]  Patient ID: Jeremiah Rodriguez, male   DOB: [birth year], 66 y.o.   MRN: [id number] Correction; pt's right leg is on cpm@0 -60 degrees@1900 ;rn notified  Jeremiah Rodriguez 02/10/2012, 7:16 PM

## 2012-02-10 NOTE — Progress Notes (Signed)
Occupational Therapy Discharge Patient Details Name: Jeremiah Rodriguez MRN: [id number] DOB: 12-Feb-1946 Today's Date: 02/10/2012 Time: 7829-5621 OT Time Calculation (min): 17 min  Patient discharged from OT services secondary to Pt completes, transfers, ambulation and ADL's at supervision level. Pt is at safe level to d/c home. No further acute OT needs.  Please see latest therapy progress note for current level of functioning and progress toward goals.    Progress and discharge plan discussed with patient and/or caregiver: Patient/Caregiver agrees with plan  GO     [name] 02/10/2012, 11:41 AM

## 2012-02-11 LAB — CBC
HCT: 36.3 % — ABNORMAL LOW (ref 39.0–52.0)
Hemoglobin: 12.3 g/dL — ABNORMAL LOW (ref 13.0–17.0)
MCHC: 33.9 g/dL (ref 30.0–36.0)
RBC: 4.03 MIL/uL — ABNORMAL LOW (ref 4.22–5.81)

## 2012-02-11 MED ORDER — METHOCARBAMOL 500 MG PO TABS: 500.0000 mg | ORAL_TABLET | Freq: Four times a day (QID) | ORAL | Status: DC | PRN

## 2012-02-11 MED ORDER — OXYCODONE HCL ER 10 MG PO T12A: 10.0000 mg | EXTENDED_RELEASE_TABLET | Freq: Two times a day (BID) | ORAL | Status: DC

## 2012-02-11 MED ORDER — ENOXAPARIN SODIUM 40 MG/0.4ML ~~LOC~~ SOLN: 40.0000 mg | Freq: Every day | SUBCUTANEOUS | Status: DC

## 2012-02-11 MED ORDER — OXYCODONE HCL 5 MG PO TABS: 5.0000 mg | ORAL_TABLET | ORAL | Status: DC | PRN

## 2012-02-11 NOTE — Progress Notes (Deleted)
Physical Therapy Treatment Patient Details Name: Jeremiah Rodriguez MRN: [id number] DOB: 1945-11-27 Today's Date: 02/11/2012 Time: 7829-5621 PT Time Calculation (min): 26 min  PT Assessment / Plan / Recommendation Comments on Treatment Session  Patient progressing very well this session. Able to increase ambulatoin and perfrom stairs again. Patient is planning to DC home today.     Follow Up Recommendations  Home health PT;Supervision/Assistance - 24 hour     Does the patient have the potential to tolerate intense rehabilitation     Barriers to Discharge        Equipment Recommendations  None recommended by PT    Recommendations for Other Services    Frequency 7X/week   Plan Discharge plan remains appropriate;Frequency remains appropriate    Precautions / Restrictions Precautions Precautions: Knee Restrictions RLE Weight Bearing: Weight bearing as tolerated   Pertinent Vitals/Pain     Mobility  Bed Mobility Supine to Sit: 6: Modified independent (Device/Increase time) Sitting - Scoot to Edge of Bed: 6: Modified independent (Device/Increase time) Transfers Sit to Stand: 6: Modified independent (Device/Increase time) Stand to Sit: 6: Modified independent (Device/Increase time) Ambulation/Gait Ambulation/Gait Assistance: 5: Supervision Ambulation Distance (Feet): 400 Feet Assistive device: Rolling walker Ambulation/Gait Assistance Details: Cues to decrease weight through UEs and increase step length Gait Pattern: Step-through pattern;Decreased stride length Stairs Assistance: 5: Supervision Stairs Assistance Details (indicate cue type and reason): patient practiced 2 step forward and 2 steps backwards both with railings Stair Management Technique: Step to pattern;Forwards;Backwards Number of Stairs: 4     Exercises Total Joint Exercises Quad Sets: AROM;Right;15 reps Short Arc Quad: AROM;Right;15 reps Heel Slides: Right;15 reps;AAROM Hip ABduction/ADduction:  AROM;Right;15 reps Straight Leg Raises: AAROM;Right;15 reps   PT Diagnosis:    PT Problem List:   PT Treatment Interventions:     PT Goals Acute Rehab PT Goals PT Goal: Sit to Supine/Side - Progress: Progressing toward goal PT Goal: Sit to Stand - Progress: Met PT Goal: Ambulate - Progress: Met PT Goal: Perform Home Exercise Program - Progress: Progressing toward goal  Visit Information  Last PT Received On: 02/11/12 Assistance Needed: +1    Subjective Data      Cognition  Overall Cognitive Status: Appears within functional limits for tasks assessed/performed Arousal/Alertness: Awake/alert Orientation Level: Appears intact for tasks assessed Behavior During Session: [facility] for tasks performed    Balance     End of Session PT - End of Session Equipment Utilized During Treatment: Gait belt Activity Tolerance: Patient tolerated treatment well Patient left: in chair;with call bell/phone within reach Nurse Communication: Mobility status   GP     Fredrich Birks 02/11/2012, 9:08 AM  02/11/2012 Fredrich Birks PTA [phone] pager [phone] office

## 2012-02-11 NOTE — Discharge Summary (Signed)
Jeremiah Spurling, MD   Jeremiah Cabal, PA-C [address] [place], [place], [name]  [address]                             [phone]  PATIENT ID: Jeremiah Rodriguez        MRN:  [id number]          DOB/AGE: 05-Jan-1946 / 66 y.o.    DISCHARGE SUMMARY  ADMISSION DATE:    02/09/2012 DISCHARGE DATE:   02/11/2012   ADMISSION DIAGNOSIS: failed right total knee replacement/painful hardware    DISCHARGE DIAGNOSIS:  failed right total knee replacement/painful hardware    ADDITIONAL DIAGNOSIS: Active Problems:  * No active hospital problems. *   Past Medical History  Diagnosis Date  . Complication of anesthesia     lungs collapsed;b/p drops  . PONV (postoperative nausea and vomiting)   . Hypertension     takes Lotensin and Metoprolol daily  . Hyperlipidemia     takes Crestor daily  . Coronary artery disease   . Arthritis   . Joint pain   . Joint swelling   . GERD (gastroesophageal reflux disease)     OTC Pepcid prn  . Hx of Lyme disease   . Urinary frequency   . Nocturia   . Enlarged prostate   . Depression     but doesn't require meds for this    PROCEDURE: Procedure(s): EXCISIONAL TOTAL KNEE ARTHROPLASTY on 02/09/2012  CONSULTS:     HISTORY:  See H&P in chart  HOSPITAL COURSE:  Jeremiah Rodriguez is a 66 y.o. admitted on 02/09/2012 and found to have a diagnosis of failed right total knee replacement/painful hardware.  After appropriate laboratory studies were obtained  they were taken to the operating room on 02/09/2012 and underwent Procedure(s): EXCISIONAL TOTAL KNEE ARTHROPLASTY.   They were given perioperative antibiotics:  Anti-infectives     Start     Dose/Rate Route Frequency Ordered Stop   02/09/12 1700   ceFAZolin (ANCEF) IVPB 2 g/50 mL premix        2 g 100 mL/hr over 30 Minutes Intravenous Every 6 hours 02/09/12 1338 02/09/12 2308   02/09/12 1400   ceFAZolin (ANCEF) IVPB 1 g/50 mL premix  Status:  Discontinued        1 g 100 mL/hr over 30 Minutes Intravenous 3  times per day 02/09/12 1110 02/09/12 1329        .  Tolerated the procedure well.  Placed with a foley intraoperatively.  Given Ofirmev at induction and for 48 hours.    POD #1, allowed out of bed to a chair.  PT for ambulation and exercise program.  Foley D/C'd in morning.  IV saline locked.  O2 discontionued.  POD #2, continued PT and ambulation.   Hemovac pulled. .  The remainder of the hospital course was dedicated to ambulation and strengthening.   The patient was discharged on 2 Days Post-Op in  Good condition.  Blood products given:none  DIAGNOSTIC STUDIES: Recent vital signs: Patient Vitals for the past 24 hrs:  BP Temp Temp src Pulse Resp SpO2  02/11/12 1300 120/70 mmHg 98.1 F (36.7 C) Oral 62  16  99 %  02/11/12 1200 - - - - 16  99 %  02/11/12 0956 124/70 mmHg - - 67  - -  02/11/12 0918 124/70 mmHg 98 F (36.7 C) Oral 67  18  99 %  02/11/12 0800 - - - -  16  99 %  02/11/12 0431 129/58 mmHg 97.6 F (36.4 C) Oral 77  16  99 %  03/06/2012 1939 126/62 mmHg 98 F (36.7 C) Oral 71  18  98 %       Recent laboratory studies:  Basename 02/11/12 0640 06-Mar-2012 0600  WBC 11.1* 11.8*  HGB 12.3* 12.2*  HCT 36.3* 35.8*  PLT 233 213    Basename 03-06-12 0600  NA 136  K 4.4  CL 101  CO2 26  BUN 16  CREATININE 1.09  GLUCOSE 147*  CALCIUM 9.0   Lab Results  Component Value Date   INR 0.92 02/03/2012   INR 1.04 12/13/2010     Recent Radiographic Studies :  Dg Chest 2 View  02/09/2012  *RADIOLOGY REPORT*  Clinical Data: Preop first right knee arthroplasty  CHEST - 2 VIEW  Comparison: 02/02/2006  Findings: Cardiomediastinal silhouette is stable.  Status post CABG.  No acute infiltrate or pleural effusion.  No pulmonary edema.  IMPRESSION: No active disease.  Status post CABG.   Original Report Authenticated By: Natasha Mead, M.D.     DISCHARGE INSTRUCTIONS: Discharge Orders    Future Orders Please Complete By Expires   Diet - low sodium heart healthy      Call MD / Call  [phone]      Comments:   If you experience chest pain or shortness of breath, CALL 911 and be transported to the hospital emergency room.  If you develope a fever above 101 F, pus (white drainage) or increased drainage or redness at the wound, or calf pain, call your surgeon's office.   Constipation Prevention      Comments:   Drink plenty of fluids.  Prune juice may be helpful.  You may use a stool softener, such as Colace (over the counter) 100 mg twice a day.  Use MiraLax (over the counter) for constipation as needed.   Increase activity slowly as tolerated      Driving restrictions      Comments:   No driving for 6 weeks   Lifting restrictions      Comments:   No lifting for 6 weeks   CPM      Comments:   Continuous passive motion machine (CPM):      Use the CPM from 0 to 90 for 6-8 hours per day.      You may increase by 10 per day.  You may break it up into 2 or 3 sessions per day.      Use CPM for 2 weeks or until you are told to stop.   TED hose      Comments:   Use stockings (TED hose) for 3 weeks on both leg(s).  You may remove them at night for sleeping.   Change dressing      Comments:   Change dressing on thursday, then change the dressing daily with sterile 4 x 4 inch gauze dressing and apply TED hose.  You may clean the incision with alcohol prior to redressing.   Do not put a pillow under the knee. Place it under the heel.         DISCHARGE MEDICATIONS:     Medication List     As of 02/11/2012  2:24 PM    STOP taking these medications         aspirin EC 81 MG tablet      TAKE these medications         benazepril-hydrochlorthiazide  20-12.5 MG per tablet   Commonly known as: LOTENSIN HCT   Take 1 tablet by mouth daily.      enoxaparin 40 MG/0.4ML injection   Commonly known as: LOVENOX   Inject 0.4 mLs (40 mg total) into the skin daily.      methocarbamol 500 MG tablet   Commonly known as: ROBAXIN   Take 1 tablet (500 mg total) by mouth every 6 (six) hours  as needed.      metoprolol 50 MG tablet   Commonly known as: LOPRESSOR   Take 50 mg by mouth daily.      oxyCODONE 5 MG immediate release tablet   Commonly known as: Oxy IR/ROXICODONE   Take 1-2 tablets (5-10 mg total) by mouth every 3 (three) hours as needed.      OxyCODONE 10 mg Tb12   Commonly known as: OXYCONTIN   Take 1 tablet (10 mg total) by mouth every 12 (twelve) hours.      rosuvastatin 20 MG tablet   Commonly known as: CRESTOR   Take 20 mg by mouth daily.        FOLLOW UP VISIT:       Follow-up Information    Follow up with Raymon Mutton, MD. Call on 02/24/2012.   Contact information:   [address] [name] [address] [phone]          DISPOSITION:  home  CONDITION:  {Good  Kyrstan Gotwalt 02/11/2012, 2:24 PM

## 2012-02-11 NOTE — Progress Notes (Signed)
Physical Therapy Treatment Patient Details Name: Jeremiah Rodriguez MRN: [id number] DOB: [birth year] Today's Date: 02/11/2012 Time: 4098-1191 PT Time Calculation (min): 26 min  PT Assessment / Plan / Recommendation Comments on Treatment Session  Patient progressing very well this session. Able to increase ambulatoin and perfrom stairs again. Patient is planning to DC home today.     Follow Up Recommendations  Home health PT;Supervision/Assistance - 24 hour     Does the patient have the potential to tolerate intense rehabilitation     Barriers to Discharge        Equipment Recommendations  None recommended by PT    Recommendations for Other Services    Frequency 7X/week   Plan Discharge plan remains appropriate;Frequency remains appropriate    Precautions / Restrictions Precautions Precautions: Knee Restrictions RLE Weight Bearing: Weight bearing as tolerated   Pertinent Vitals/Pain     Mobility  Bed Mobility Supine to Sit: 6: Modified independent (Device/Increase time) Sitting - Scoot to Edge of Bed: 6: Modified independent (Device/Increase time) Transfers Sit to Stand: 6: Modified independent (Device/Increase time) Stand to Sit: 6: Modified independent (Device/Increase time) Ambulation/Gait Ambulation/Gait Assistance: 5: Supervision Ambulation Distance (Feet): 400 Feet Assistive device: Rolling walker Ambulation/Gait Assistance Details: Cues to decrease weight through UEs and increase step length Gait Pattern: Step-through pattern;Decreased stride length Stairs Assistance: 5: Supervision Stairs Assistance Details (indicate cue type and reason): patient practiced 2 step forward and 2 steps backwards both with railings Stair Management Technique: Step to pattern;Forwards;Backwards Number of Stairs: 4     Exercises Total Joint Exercises Quad Sets: AROM;Right;15 reps Short Arc Quad: AROM;Right;15 reps Heel Slides: Right;15 reps;AAROM Hip ABduction/ADduction:  AROM;Right;15 reps Straight Leg Raises: AAROM;Right;15 reps   PT Diagnosis:    PT Problem List:   PT Treatment Interventions:     PT Goals Acute Rehab PT Goals PT Goal: Sit to Supine/Side - Progress: Progressing toward goal PT Goal: Sit to Stand - Progress: Met PT Goal: Ambulate - Progress: Met Pt will Go Up / Down Stairs: 3-5 stairs;with modified independence;with least restrictive assistive device PT Goal: Up/Down Stairs - Progress: Goal set today PT Goal: Perform Home Exercise Program - Progress: Progressing toward goal  Visit Information  Last PT Received On: 02/11/12 Assistance Needed: +1    Subjective Data      Cognition  Overall Cognitive Status: Appears within functional limits for tasks assessed/performed Arousal/Alertness: Awake/alert Orientation Level: Appears intact for tasks assessed Behavior During Session: [facility] for tasks performed    Balance     End of Session PT - End of Session Equipment Utilized During Treatment: Gait belt Activity Tolerance: Patient tolerated treatment well Patient left: in chair;with call bell/phone within reach Nurse Communication: Mobility status   GP     Fredrich Birks 02/11/2012, 10:25 AM 02/11/2012 Fredrich Birks PTA [phone] pager [phone] office

## 2012-02-11 NOTE — Progress Notes (Signed)
Agree with update of stair goal.  02/11/2012 Cephus Shelling, PT, DPT [phone]

## 2012-02-11 NOTE — Progress Notes (Signed)
Agree with need for stair trial. Will add Stair Goal at next session of:  Pt will Go Up / Down Stairs: 3-5 stairs;with modified independence;with least restrictive assistive device  PT Goal: Up/Down Stairs - Progress: Goal set today   02/11/2012 Cephus Shelling, PT, DPT [phone]

## 2012-02-11 NOTE — Progress Notes (Signed)
Physical Therapy Treatment Patient Details Name: Jeremiah Rodriguez MRN: [id number] DOB: [birth year] Today's Date: 02/11/2012 Time: 4098-1191 PT Time Calculation (min): 26 min  PT Assessment / Plan / Recommendation Comments on Treatment Session       Follow Up Recommendations  Home health PT;Supervision/Assistance - 24 hour     Does the patient have the potential to tolerate intense rehabilitation     Barriers to Discharge        Equipment Recommendations  None recommended by PT    Recommendations for Other Services    Frequency 7X/week   Plan Discharge plan remains appropriate;Frequency remains appropriate    Precautions / Restrictions Precautions Precautions: Knee Restrictions RLE Weight Bearing: Weight bearing as tolerated   Pertinent Vitals/Pain     Mobility  Bed Mobility Supine to Sit: 6: Modified independent (Device/Increase time) Transfers Sit to Stand: 6: Modified independent (Device/Increase time) Stand to Sit: 6: Modified independent (Device/Increase time) Ambulation/Gait Ambulation/Gait Assistance: 6: Modified independent (Device/Increase time) Ambulation Distance (Feet): 600 Feet Assistive device: Rolling walker Gait Pattern: Step-through pattern;Trunk flexed Gait velocity: WFL Stairs: Yes Stairs Assistance: 6: Modified independent (Device/Increase time) Stair Management Technique: Two rails;Step to pattern;Backwards;Forwards Number of Stairs: 4     Exercises Total Joint Exercises Heel Slides: AROM;Right;10 reps Hip ABduction/ADduction: AROM;Right;10 reps Straight Leg Raises: AROM;5 reps   PT Diagnosis:    PT Problem List:   PT Treatment Interventions:     PT Goals Acute Rehab PT Goals PT Goal: Sit to Supine/Side - Progress: Met PT Goal: Sit to Stand - Progress: Met PT Goal: Ambulate - Progress: Met Pt will Go Up / Down Stairs: 3-5 stairs;with modified independence;with least restrictive assistive device PT Goal: Up/Down Stairs - Progress:  Met PT Goal: Perform Home Exercise Program - Progress: Met  Visit Information  Last PT Received On: 02/11/12 Assistance Needed: +1    Subjective Data      Cognition  Overall Cognitive Status: Appears within functional limits for tasks assessed/performed Arousal/Alertness: Awake/alert Orientation Level: Appears intact for tasks assessed Behavior During Session: [facility] for tasks performed    Balance     End of Session PT - End of Session Equipment Utilized During Treatment: Gait belt Activity Tolerance: Patient tolerated treatment well Patient left: in chair;with call bell/phone within reach Nurse Communication: Mobility status   GP     Fredrich Birks 02/11/2012, 1:58 PM 02/11/2012 Fredrich Birks PTA [phone] pager [phone] office

## 2012-02-11 NOTE — Progress Notes (Signed)
SPORTS MEDICINE AND JOINT REPLACEMENT  Georgena Spurling, MD   Altamese Cabal, PA-C [address] [place], [place], [name]  [address]                             [phone]   PROGRESS NOTE  Subjective:  negative for Chest Pain  negative for Shortness of Breath  negative for Nausea/Vomiting   negative for Calf Pain  negative for Bowel Movement   Tolerating Diet: yes         Patient reports pain as 5 on 0-10 scale.    Objective: Vital signs in last 24 hours:   Patient Vitals for the past 24 hrs:  BP Temp Temp src Pulse Resp SpO2  02/11/12 1300 120/70 mmHg 98.1 F (36.7 C) Oral 62  16  99 %  02/11/12 1200 - - - - 16  99 %  02/11/12 0956 124/70 mmHg - - 67  - -  02/11/12 0918 124/70 mmHg 98 F (36.7 C) Oral 67  18  99 %  02/11/12 0800 - - - - 16  99 %  02/11/12 0431 129/58 mmHg 97.6 F (36.4 C) Oral 77  16  99 %  02/10/12 1939 126/62 mmHg 98 F (36.7 C) Oral 71  18  98 %    @flow {1959:LAST@   Intake/Output from previous day:   11/05 0701 - 11/06 0700 In: 720 [P.O.:720] Out: 600 [Urine:575; Drains:25]   Intake/Output this shift:       Intake/Output      11/05 0701 - 11/06 0700 11/06 0701 - 11/07 0700   P.O. 720    I.V.     IV Piggyback     Total Intake 720    Urine 575    Drains 25    Total Output 600    Net +120         Urine Occurrence 3 x    Stool Occurrence 1 x       LABORATORY DATA:  Basename 02/11/12 0640 02/10/12 0600  WBC 11.1* 11.8*  HGB 12.3* 12.2*  HCT 36.3* 35.8*  PLT 233 213    Basename 02/10/12 0600  NA 136  K 4.4  CL 101  CO2 26  BUN 16  CREATININE 1.09  GLUCOSE 147*  CALCIUM 9.0   Lab Results  Component Value Date   INR 0.92 02/03/2012   INR 1.04 12/13/2010    Examination:  General appearance: alert, cooperative and no distress Extremities: Homans sign is negative, no sign of DVT  Wound Exam: clean, dry, intact   Drainage:  None: wound tissue dry  Motor Exam: EHL and FHL Intact  Sensory Exam: Deep Peroneal  normal  Vascular Exam:    Assessment:    2 Days Post-Op  Procedure(s) (LRB): EXCISIONAL TOTAL KNEE ARTHROPLASTY (Right)  ADDITIONAL DIAGNOSIS:  Active Problems:  * No active hospital problems. *   Acute Blood Loss Anemia   Plan: Physical Therapy as ordered Weight Bearing as Tolerated (WBAT)  DVT Prophylaxis:  Lovenox  DISCHARGE PLAN: Home  DISCHARGE NEEDS: HHPT, CPM, Walker and 3-in-1 comode seat         Javonnie Illescas 02/11/2012, 2:17 PM

## 2012-02-12 LAB — BODY FLUID CULTURE

## 2012-02-14 LAB — ANAEROBIC CULTURE: Gram Stain: NONE SEEN

## 2012-02-14 NOTE — Op Note (Signed)
Dictation Number:  [id number]

## 2012-02-15 NOTE — Op Note (Signed)
NAME:  AKIL, RYBA NO.:  [id number]  MEDICAL RECORD NO.:  [id number]  LOCATION:  [id number]                        FACILITY:  [facility]  PHYSICIAN:  Mila Homer. Sherlean Foot, M.D. DATE OF BIRTH:  01-22-1946  DATE OF PROCEDURE:  02/09/2012 DATE OF DISCHARGE:  02/11/2012                              OPERATIVE REPORT   SURGEON:  Mila Homer. Sherlean Foot, M.D.  ASSISTANT:  Altamese Cabal, PA-C.  ANESTHESIA:  General.  PREOPERATIVE DIAGNOSIS:  Right failed total knee arthroplasty.  POSTOPERATIVE DIAGNOSIS:  Right failed total knee arthroplasty.  PROCEDURE:  Right knee revision arthroplasty with polyethylene exchange, synovectomy, deep irrigation and debridement.  INDICATION FOR PROCEDURE:  The patient is a 66 year old, white male __________ 1 year status post a primary total knee arthroplasty with failure and pain, but no evidence of infection.  __________ diagnosis was that there was instability of the knee and synovitis.  Informed consent was obtained.  DESCRIPTION OF PROCEDURE:  The patient was laid supine and administered general anesthesia.  Right leg was prepped and draped in usual sterile fashion.  The extremity was exsanguinated with Esmarch and the tourniquet inflated to 350 mmHg and set for an hour.  I then __________ incision.  I made that with a #10 blade.  I then made a median parapatellar arthrotomy and performed synovectomy.  The synovium was __________ I sent it off for stat Gram stain, culture, sensitivity, and sent the tissue to the pathologist, which came back negative for infection.  I elevated the deep __________ medial crest of the tibia.  I did evaluate the stability of the knee and felt that there was __________ flexion and extension gaps and the polyethylene was too small.  I removed the __________ polyethylene __________ synovectomy in the back of the knee.  I then trialed and increase that to a 15-mm from 12.5 mm.  This afforded much better stability and  full range of motion. I then lavaged and closed the arthrotomy with #1 Vicryl.  Deep soft tissue buried with #0 Vicryl, subcuticular #3-0 Vicryl, and skin staples.  I dressed with Xeroform dressing, sponges, sterile Webril, and Ace wrap.  COMPLICATIONS:  None.  DRAINS:  One Hemovac __________ catheter.  ESTIMATED BLOOD LOSS:  300 mL.  TOURNIQUET TIME:  48 minutes.          ______________________________ Mila Homer Sherlean Foot, M.D.     SDL/MEDQ  D:  02/14/2012  T:  02/15/2012  Job:  [id number]

## 2012-05-11 ENCOUNTER — Encounter: Payer: Self-pay | Admitting: Pharmacist Clinician (PhC)/ Clinical Pharmacy Specialist

## 2012-07-22 ENCOUNTER — Other Ambulatory Visit (HOSPITAL_COMMUNITY): Payer: Self-pay | Admitting: Internal Medicine

## 2012-07-22 DIAGNOSIS — R0600 Dyspnea, unspecified: Secondary | ICD-10-CM

## 2012-07-22 DIAGNOSIS — R002 Palpitations: Secondary | ICD-10-CM

## 2012-07-22 DIAGNOSIS — R5381 Other malaise: Secondary | ICD-10-CM

## 2012-08-04 ENCOUNTER — Ambulatory Visit (HOSPITAL_COMMUNITY)
Admission: RE | Admit: 2012-08-04 | Discharge: 2012-08-04 | Disposition: A | Discharge status: Home / Self Care | Payer: Medicare Other | Source: Ambulatory Visit | Attending: Internal Medicine | Admitting: Internal Medicine

## 2012-08-04 DIAGNOSIS — R5381 Other malaise: Secondary | ICD-10-CM | POA: Insufficient documentation

## 2012-08-04 DIAGNOSIS — R079 Chest pain, unspecified: Secondary | ICD-10-CM | POA: Insufficient documentation

## 2012-08-04 DIAGNOSIS — R0600 Dyspnea, unspecified: Secondary | ICD-10-CM

## 2012-08-04 DIAGNOSIS — R0602 Shortness of breath: Secondary | ICD-10-CM | POA: Insufficient documentation

## 2012-08-04 DIAGNOSIS — R5383 Other fatigue: Secondary | ICD-10-CM

## 2012-08-04 DIAGNOSIS — R002 Palpitations: Secondary | ICD-10-CM | POA: Insufficient documentation

## 2012-08-04 DIAGNOSIS — R42 Dizziness and giddiness: Secondary | ICD-10-CM | POA: Insufficient documentation

## 2012-08-04 MED ORDER — TECHNETIUM TC 99M SESTAMIBI GENERIC - CARDIOLITE
10.6000 | Freq: Once | INTRAVENOUS | Status: AC | PRN
  Administered 2012-08-04: 11 via INTRAVENOUS

## 2012-08-04 MED ORDER — TECHNETIUM TC 99M SESTAMIBI GENERIC - CARDIOLITE
30.2000 | Freq: Once | INTRAVENOUS | Status: AC | PRN
  Administered 2012-08-04: 30.2 via INTRAVENOUS

## 2012-08-04 MED ORDER — REGADENOSON 0.4 MG/5ML IV SOLN
0.4000 mg | Freq: Once | INTRAVENOUS | Status: AC
  Administered 2012-08-04: 0.4 mg via INTRAVENOUS

## 2012-08-04 NOTE — Procedures (Addendum)
[facility] [address] [place] [address] [name] [address] [phone]  Cardiology Nuclear Med Study  Jeremiah Rodriguez is a 67 y.o. male     MRN : [id number]     DOB: Oct 24, 1945  Procedure Date: 08/04/2012  Nuclear Med Background Indication for Stress Test:  Evaluation for Ischemia, Graft Patency and PTCA Patency History:  CAD;CABG X3--08/22/2004;PTCA--08/21/2004 Cardiac Risk Factors: Claudication, Family History - CAD, History of Smoking, Hypertension, Lipids, Overweight, RBBB and PVC'S;SVT  Symptoms:  Chest Pain, Fatigue, Light-Headedness, Palpitations and SOB   Nuclear Pre-Procedure Caffeine/Decaff Intake:  8:30pm NPO After: 5:00am   IV Site: R Forearm  IV 0.9% NS with Angio Cath:  22g  Chest Size (in):  46" IV Started by: Emmit Pomfret, RN  Height: 6\' 1"  (1.854 m)  Cup Size: n/a  BMI:  Body mass index is 27.84 kg/(m^2). Weight:  211 lb (95.709 kg)   Tech Comments:  N/A    Nuclear Med Study 1 or 2 day study: 1 day  Stress Test Type:  Stress  Order Authorizing Provider:  Zoila Shutter, MD   Resting Radionuclide: Technetium 70m Sestamibi  Resting Radionuclide Dose: 10.6 mCi   Stress Radionuclide:  Technetium 54m Sestamibi  Stress Radionuclide Dose: 30.2 mCi           Stress Protocol Rest HR:81 Stress HR: 108  Rest BP: 157/98 Stress BP: 181/96  Exercise Time (min): n/a METS: n/a   Predicted Max HR: 153 bpm % Max HR: 70.59 bpm Rate Pressure Product: 46962  Dose of Adenosine (mg):  n/a Dose of Lexiscan: 0.4 mg  Dose of Atropine (mg): n/a Dose of Dobutamine: n/a mcg/kg/min (at max HR)  Stress Test Technologist: Esperanza Sheets, CCT Nuclear Technologist: Gonzella Lex, CNMT   Rest Procedure:  Myocardial perfusion imaging was performed at rest 45 minutes following the intravenous administration of Technetium 31m Sestamibi. Stress Procedure:  The patient received IV Lexiscan 0.4 mg over 15-seconds.  Technetium 67m Sestamibi  injected at 30-seconds.  There were no significant changes with Lexiscan.  Quantitative spect images were obtained after a 45 minute delay.  Transient Ischemic Dilatation (Normal <1.22):  1.05 Lung/Heart Ratio (Normal <0.45):  0.35 QGS EDV:  80 ml QGS ESV:  30 ml LV Ejection Fraction: 63%  Signed by       Rest ECG: NSR-RBBB  Stress ECG: No significant change from baseline ECG  QPS Raw Data Images:  Normal; no motion artifact; normal heart/lung ratio. Stress Images:  Normal homogeneous uptake in all areas of the myocardium. Rest Images:  Normal homogeneous uptake in all areas of the myocardium. Subtraction (SDS):  No evidence of ischemia.  Impression Exercise Capacity:  Lexiscan with no exercise. BP Response:  Normal blood pressure response. Clinical Symptoms:  No significant symptoms noted. ECG Impression:  No significant ST segment change suggestive of ischemia. Comparison with Prior Nuclear Study: No significant change from previous study  Overall Impression:  Normal stress nuclear study.  LV Wall Motion:  NL LV Function; NL Wall Motion   Runell Gess, MD  08/04/2012 1:53 PM

## 2012-11-23 ENCOUNTER — Encounter: Payer: Self-pay | Admitting: Internal Medicine

## 2013-02-14 ENCOUNTER — Ambulatory Visit (INDEPENDENT_AMBULATORY_CARE_PROVIDER_SITE_OTHER): Payer: Medicare Other | Admitting: Internal Medicine

## 2013-02-14 ENCOUNTER — Encounter: Payer: Self-pay | Admitting: Internal Medicine

## 2013-02-14 VITALS — BP 130/80 | HR 71 | Ht 73.0 in | Wt 225.6 lb

## 2013-02-14 DIAGNOSIS — E782 Mixed hyperlipidemia: Secondary | ICD-10-CM

## 2013-02-14 DIAGNOSIS — K589 Irritable bowel syndrome without diarrhea: Secondary | ICD-10-CM

## 2013-02-14 DIAGNOSIS — I2510 Atherosclerotic heart disease of native coronary artery without angina pectoris: Secondary | ICD-10-CM

## 2013-02-14 DIAGNOSIS — F329 Major depressive disorder, single episode, unspecified: Secondary | ICD-10-CM | POA: Insufficient documentation

## 2013-02-14 DIAGNOSIS — Z951 Presence of aortocoronary bypass graft: Secondary | ICD-10-CM

## 2013-02-14 DIAGNOSIS — I25810 Atherosclerosis of coronary artery bypass graft(s) without angina pectoris: Secondary | ICD-10-CM

## 2013-02-14 DIAGNOSIS — Z79899 Other long term (current) drug therapy: Secondary | ICD-10-CM

## 2013-02-14 DIAGNOSIS — F341 Dysthymic disorder: Secondary | ICD-10-CM

## 2013-02-14 DIAGNOSIS — E785 Hyperlipidemia, unspecified: Secondary | ICD-10-CM

## 2013-02-14 DIAGNOSIS — F419 Anxiety disorder, unspecified: Secondary | ICD-10-CM

## 2013-02-14 NOTE — Patient Instructions (Signed)
Your physician wants you to follow-up in 6 months Dr Rennis Golden.  You will receive a reminder letter in the mail two months in advance. If you don't receive a letter, please call our office to schedule the follow-up appointment.  LABS IN 6 MONTHS -NMR-LIPIDS;CMP

## 2013-02-14 NOTE — Progress Notes (Signed)
OFFICE NOTE  Chief Complaint:  Sharp chest pains, knee pain, neck pain  Primary Care Physician: Carmin Richmond, MD  HPI:  Jeremiah Rodriguez  a 67 year old gentleman who I saw initially for cardiac clearance prior to knee surgery. He was previously followed by Dr. Clarene Duke prior to transferring to your care. He underwent a nuclear stress test in February 2011 which showed a normal EF but does have a history of coronary artery disease with bypass surgery and a LIMA to LAD, saphenous vein graft to circumflex and saphenous vein graft to the PDA in May 2006. He has also had some fluttering or electrical-type pain in his chest and shortness of breath with exercise. I was concerned about ischemia and did recommend a nuclear stress test. He underwent a stress test on August 04, 2012, and that was negative for ischemia with an EF of 61%.  He was having arms with rapid intestinal transit, which sounded like diarrhea predominant IBS. I recommended a probiotic daily and again followup with you. From a coronary standpoint, I do not believe his fatigue is due to significant coronary ischemia with a recent negative stress test and bypass grafts that are now less than 17 years old. Finally his other complaints today center around chronic pain in his right knee and his neck. He was previously seeing a pain management specialist but is no longer. He is getting injections into his right knee.   PMHx:  Past Medical History  Diagnosis Date  . Complication of anesthesia     lungs collapsed;b/p drops  . PONV (postoperative nausea and vomiting)   . Hypertension     takes Lotensin and Metoprolol daily  . Hyperlipidemia     takes Crestor daily  . Arthritis   . Joint pain   . Joint swelling   . GERD (gastroesophageal reflux disease)     OTC Pepcid prn  . Hx of Lyme disease   . Urinary frequency   . Nocturia   . Enlarged prostate   . Depression     but doesn't require meds for this  . CAD (coronary artery  disease) 04/23/2006    Echo - LV systolic fcn normal  . CAD (coronary artery disease) 09/16/2004    Echo -large (4cm) pericardial effusion predominatly along posterior portion, no evidence of tamponade  . S/P CABG x 3 05/30/10 stress test    norm perfusion all regions, EF61%, EKG neg for ischemia  . Cerebral atherosclerosis 04/23/2011    R ICA low end 0-49% reduction, L ICA high end 0-49% reduction, subclavian and vertebral arteries antegrade and patent  . Claudication 04/22/2011    normal LE arterial evaluation    Past Surgical History  Procedure Laterality Date  . Facial cosmetic surgery      at age 89  . Joint replacement  2012    right knee  . Nodule on groin removed  1980  . Nodule removed from thyroid   2003  . Coronary artery bypass graft  2006    3; LIMA to LAD, SVG to circumflex and SVG to PDA  . Cholecystectomy  2011  . Knee arthroscopy  2012    right   . Transurethral resection of prostate    . Cardiac catheterization  08/21/2004    LAD 40% proximal and 60% mid vessel lesion,  60% early mid vessel stenosis of RCA, PDA w/ 70% ostial lesion  . Excisional total knee arthroplasty  02/09/2012    Procedure: EXCISIONAL TOTAL KNEE  ARTHROPLASTY;  Surgeon: Raymon Mutton, MD;  Location: [facility] OR;  Service: Orthopedics;  Laterality: Right;  polyexchange right total knee versus total knee revision    FAMHx:  Family History  Problem Relation Age of Onset  . Heart attack Mother 61  . Heart attack Father 50    SOCHx:   reports that he quit smoking about 39 years ago. He has never used smokeless tobacco. He reports that he does not drink alcohol or use illicit drugs.  ALLERGIES:  No Known Allergies  ROS: A comprehensive review of systems was negative except for: Constitutional: positive for fatigue Cardiovascular: positive for chest pain Gastrointestinal: positive for diarrhea Musculoskeletal: positive for back pain and neck pain  HOME MEDS: Current Outpatient Prescriptions    Medication Sig Dispense Refill  . aspirin EC 81 MG tablet Take 81 mg by mouth daily.      . benazepril-hydrochlorthiazide (LOTENSIN HCT) 20-12.5 MG per tablet Take 1 tablet by mouth 2 (two) times daily.       Marland Kitchen escitalopram (LEXAPRO) 10 MG tablet Take 10 mg by mouth daily.      . metoprolol (LOPRESSOR) 50 MG tablet Take 50 mg by mouth 2 (two) times daily.       . rosuvastatin (CRESTOR) 20 MG tablet Take 20 mg by mouth daily.       No current facility-administered medications for this visit.    LABS/IMAGING: No results found for this or any previous visit (from the past 48 hour(s)). No results found.  VITALS: BP 130/80  Pulse 71  Ht 6\' 1"  (1.854 m)  Wt 225 lb 9.6 oz (102.331 kg)  BMI 29.77 kg/m2  EXAM: General appearance: alert and no distress Neck: no carotid bruit, no JVD and thyroid not enlarged, symmetric, no tenderness/mass/nodules Lungs: clear to auscultation bilaterally Heart: regular rate and rhythm, S1, S2 normal, no murmur, click, rub or gallop Abdomen: soft, non-tender; bowel sounds normal; no masses,  no organomegaly Extremities: extremities normal, atraumatic, no cyanosis or edema Pulses: 2+ and symmetric Skin: Skin color, texture, turgor normal. No rashes or lesions Neurologic: Grossly normal Psych: Appears anxious  EKG:  Normal sinus rhythm at 71, RSR in V1 nonspecific T wave changes  ASSESSMENT: 1. Coronary artery disease status post 3 vessel CABG in 2006 (LIMA to LAD, SVG to the circumflex and SVG to PDA) 2. Negative nuclear stress test in April 2014, EF 61% 3. Chronic back knee and chest pain 4. Hypertension 5. Dyslipidemia 6. Anxiety/depression  PLAN: 1.   Mr. Smoak had similar complaints to the past, but continues to have musculoskeletal and chest wall pain. I suspect that his depression and anxiety are not well controlled on his current medications. He says that the Lexapro is somewhat helpful for him but he has not tried higher doses. He was  previously on Cymbalta prescribed by Dr. Clarene Duke, however it was too expensive for him to take. He is also previously taking gabapentin but discontinued this when he stopped seeing his pain management doctor. He currently is in pain almost every day but does not take medication as he has not been able to get an appointment with his primary care provider. I've encouraged him to continue to followup with his primary for these issues.  Plan to see him back in 6 months for close followup.  Chrystie Nose, MD, [facility] Attending Cardiologist [facility]  Randolf Sansoucie C 02/14/2013, 10:44 AM

## 2013-06-15 ENCOUNTER — Other Ambulatory Visit: Payer: Self-pay | Admitting: Specialist

## 2013-06-15 DIAGNOSIS — M549 Dorsalgia, unspecified: Secondary | ICD-10-CM

## 2013-06-22 ENCOUNTER — Other Ambulatory Visit: Payer: Self-pay | Admitting: Specialist

## 2013-06-22 ENCOUNTER — Ambulatory Visit
Admission: RE | Admit: 2013-06-22 | Discharge: 2013-06-22 | Disposition: A | Discharge status: Home / Self Care | Payer: Medicare Other | Source: Ambulatory Visit | Attending: Specialist | Admitting: Specialist

## 2013-06-22 VITALS — BP 122/71 | HR 78 | Resp 14

## 2013-06-22 DIAGNOSIS — M519 Unspecified thoracic, thoracolumbar and lumbosacral intervertebral disc disorder: Secondary | ICD-10-CM

## 2013-06-22 DIAGNOSIS — M25561 Pain in right knee: Secondary | ICD-10-CM

## 2013-06-22 DIAGNOSIS — M549 Dorsalgia, unspecified: Secondary | ICD-10-CM

## 2013-06-22 MED ORDER — KETOROLAC TROMETHAMINE 30 MG/ML IJ SOLN
30.0000 mg | Freq: Once | INTRAMUSCULAR | Status: AC
  Administered 2013-06-22: 30 mg via INTRAVENOUS

## 2013-06-22 MED ORDER — SODIUM CHLORIDE 0.9 % IV SOLN
Freq: Once | INTRAVENOUS | Status: AC
  Administered 2013-06-22: 09:00:00 via INTRAVENOUS

## 2013-06-22 MED ORDER — FENTANYL CITRATE 0.05 MG/ML IJ SOLN
25.0000 ug | INTRAMUSCULAR | Status: DC | PRN
  Administered 2013-06-22: 100 ug via INTRAVENOUS

## 2013-06-22 MED ORDER — CEFAZOLIN SODIUM-DEXTROSE 2-3 GM-% IV SOLR
2.0000 g | Freq: Once | INTRAVENOUS | Status: AC
  Administered 2013-06-22: 2 g via INTRAVENOUS

## 2013-06-22 MED ORDER — MIDAZOLAM HCL 2 MG/2ML IJ SOLN
1.0000 mg | INTRAMUSCULAR | Status: DC | PRN
  Administered 2013-06-22: 1 mg via INTRAVENOUS

## 2013-06-22 MED ORDER — IOHEXOL 180 MG/ML  SOLN
4.0000 mL | Freq: Once | INTRAMUSCULAR | Status: AC | PRN
  Administered 2013-06-22: 4 mL

## 2013-06-22 NOTE — Progress Notes (Addendum)
Discharge instructions explained to pt.  Dr. Karin GoldenShogry in to visit and explain procedure.

## 2013-06-22 NOTE — Discharge Instructions (Signed)
Discogram Post Procedure Discharge Instructions ° °1. May resume a regular diet and any medications that you routinely take (including pain medications). °2. No driving day of procedure. °3. Upon discharge go home and rest for at least 4 hours.  May use an ice pack as needed to injection sites on back.  Ice to back 30 minutes on and 30 minutes off, all day. °4. May remove bandades later, today. °5. It is not unusual to be sore for several days after this procedure. ° ° ° °Please contact our office at [phone] for the following symptoms: ° °· Fever greater than 100 degrees °· Increased swelling, pain, or redness at injection site. ° ° °Thank you for visiting [facility]. ° ° °

## 2013-07-13 ENCOUNTER — Telehealth: Payer: Self-pay | Admitting: *Deleted

## 2013-07-13 DIAGNOSIS — Z79899 Other long term (current) drug therapy: Secondary | ICD-10-CM

## 2013-07-13 DIAGNOSIS — E782 Mixed hyperlipidemia: Secondary | ICD-10-CM

## 2013-07-13 NOTE — Telephone Encounter (Signed)
Message copied by Tobin ChadMARTIN, SHARON V. on Wed Jul 13, 2013  2:13 PM ------      Message from: Tobin ChadMARTIN, SHARON V.      Created: Mon Feb 14, 2013 10:44 AM       Labs need to be released for may 2015      Labs---- cmp,nmr ------

## 2013-07-13 NOTE — Telephone Encounter (Signed)
Mail letter and labslip NMR with lipids, CMP IN MAY 2015

## 2013-09-09 ENCOUNTER — Encounter: Payer: Self-pay | Admitting: Physical Medicine & Rehabilitation

## 2013-10-12 ENCOUNTER — Other Ambulatory Visit (HOSPITAL_COMMUNITY): Payer: Self-pay | Admitting: Specialist

## 2013-10-12 DIAGNOSIS — M25561 Pain in right knee: Secondary | ICD-10-CM

## 2013-10-17 ENCOUNTER — Encounter (HOSPITAL_COMMUNITY)
Admission: RE | Admit: 2013-10-17 | Discharge: 2013-10-17 | Disposition: A | Discharge status: Home / Self Care | Payer: Medicare Other | Source: Ambulatory Visit | Attending: Specialist | Admitting: Specialist

## 2013-10-17 ENCOUNTER — Encounter (HOSPITAL_COMMUNITY)
Admission: RE | Admit: 2013-10-17 | Discharge: 2013-10-17 | Disposition: A | Discharge status: Home / Self Care | Payer: Medicare Other | Source: Ambulatory Visit | Attending: Diagnostic Radiology | Admitting: Diagnostic Radiology

## 2013-10-17 DIAGNOSIS — Y831 Surgical operation with implant of artificial internal device as the cause of abnormal reaction of the patient, or of later complication, without mention of misadventure at the time of the procedure: Secondary | ICD-10-CM | POA: Insufficient documentation

## 2013-10-17 DIAGNOSIS — M25569 Pain in unspecified knee: Secondary | ICD-10-CM | POA: Diagnosis not present

## 2013-10-17 DIAGNOSIS — Z96659 Presence of unspecified artificial knee joint: Secondary | ICD-10-CM | POA: Diagnosis not present

## 2013-10-17 DIAGNOSIS — T8489XA Other specified complication of internal orthopedic prosthetic devices, implants and grafts, initial encounter: Secondary | ICD-10-CM | POA: Insufficient documentation

## 2013-10-17 DIAGNOSIS — M25561 Pain in right knee: Secondary | ICD-10-CM

## 2013-10-17 DIAGNOSIS — Y929 Unspecified place or not applicable: Secondary | ICD-10-CM | POA: Diagnosis not present

## 2013-10-17 MED ORDER — TECHNETIUM TC 99M MEDRONATE IV KIT: 25.0000 | PACK | Freq: Once | INTRAVENOUS | Status: DC | PRN

## 2013-10-28 ENCOUNTER — Ambulatory Visit (HOSPITAL_BASED_OUTPATIENT_CLINIC_OR_DEPARTMENT_OTHER): Payer: Medicare Other | Admitting: Physical Medicine & Rehabilitation

## 2013-10-28 ENCOUNTER — Encounter: Payer: Medicare Other | Attending: Physical Medicine & Rehabilitation

## 2013-10-28 ENCOUNTER — Encounter: Payer: Self-pay | Admitting: Physical Medicine & Rehabilitation

## 2013-10-28 VITALS — BP 154/80 | HR 80 | Resp 14 | Ht 73.0 in | Wt 214.0 lb

## 2013-10-28 DIAGNOSIS — Z5181 Encounter for therapeutic drug level monitoring: Secondary | ICD-10-CM

## 2013-10-28 DIAGNOSIS — Z951 Presence of aortocoronary bypass graft: Secondary | ICD-10-CM | POA: Insufficient documentation

## 2013-10-28 DIAGNOSIS — I10 Essential (primary) hypertension: Secondary | ICD-10-CM | POA: Insufficient documentation

## 2013-10-28 DIAGNOSIS — K219 Gastro-esophageal reflux disease without esophagitis: Secondary | ICD-10-CM | POA: Diagnosis not present

## 2013-10-28 DIAGNOSIS — G8918 Other acute postprocedural pain: Secondary | ICD-10-CM | POA: Insufficient documentation

## 2013-10-28 DIAGNOSIS — N400 Enlarged prostate without lower urinary tract symptoms: Secondary | ICD-10-CM | POA: Diagnosis not present

## 2013-10-28 DIAGNOSIS — E785 Hyperlipidemia, unspecified: Secondary | ICD-10-CM | POA: Diagnosis not present

## 2013-10-28 DIAGNOSIS — Z79899 Other long term (current) drug therapy: Secondary | ICD-10-CM | POA: Diagnosis not present

## 2013-10-28 DIAGNOSIS — M25561 Pain in right knee: Secondary | ICD-10-CM

## 2013-10-28 DIAGNOSIS — I2510 Atherosclerotic heart disease of native coronary artery without angina pectoris: Secondary | ICD-10-CM | POA: Diagnosis not present

## 2013-10-28 DIAGNOSIS — M25559 Pain in unspecified hip: Secondary | ICD-10-CM | POA: Insufficient documentation

## 2013-10-28 DIAGNOSIS — G8928 Other chronic postprocedural pain: Secondary | ICD-10-CM | POA: Insufficient documentation

## 2013-10-28 DIAGNOSIS — Z96659 Presence of unspecified artificial knee joint: Secondary | ICD-10-CM | POA: Diagnosis not present

## 2013-10-28 DIAGNOSIS — M25569 Pain in unspecified knee: Secondary | ICD-10-CM

## 2013-10-28 MED ORDER — TOPIRAMATE 25 MG PO TABS: 25.0000 mg | ORAL_TABLET | Freq: Two times a day (BID) | ORAL | Status: DC

## 2013-10-28 MED ORDER — LIDOCAINE 5 % EX OINT: 1.0000 "application " | TOPICAL_OINTMENT | CUTANEOUS | Status: AC | PRN

## 2013-10-28 NOTE — Patient Instructions (Signed)
Genicular nerve block next visit

## 2013-10-28 NOTE — Progress Notes (Signed)
Subjective:    Patient ID: Jeremiah Rodriguez, male    DOB: 1945-04-16, 68 y.o.   MRN: [id number]  HPI Chief complaint right knee pain.Constant day and night History. History of right knee arthritis underwent total knee replacement in November of 2012 first by Dr. Priscille Kluver. Had a second revision procedure done by Dr. Sherlean Foot synovectomy as well as exchange of poly-liner on 02/09/2012. This did not help with him. Underwent evaluation by Dr. Otelia Sergeant to on 08/18/2013. Did not feel that any surgical intervention would be helpful. Patient has had EMG showing a possible L5 or S1 radicular finding but no findings at L3 or L4. Discogram did not replicate pain. Lumbar epidural steroid injections were not helpful. Has had workup in terms of potential infection with normal sed rates and normal CRPs. Bone scan showed no worrisome findings, repeat it again on 10/17/2013. 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 This responded to [facility]. EMG of the upper extremities is normal.  Went to outpt therapy at least 2 places also does home exercise program  Went to Dr Vear Clock, tried tramadol, gabapentin, Lyrica,Voltaren gel, capsaicin, some type of patch not helpful no nerve blocks  Difficulty with bathing, manily showers Pain Inventory Average Pain 5 Pain Right Now 5 My pain is sharp, stabbing and aching  In the last 24 hours, has pain interfered with the following? General activity 6 Relation with others 6 Enjoyment of life 6 What TIME of day is your pain at its worst? day, evening and night Sleep (in general) Poor  Pain is worse with: walking, bending, sitting and standing Pain improves with: medication Relief from Meds: 5  Mobility use a cane ability to climb steps?  yes do you drive?  yes  Function not employed: date last employed 2012 retired I need assistance with the following:   bathing  Neuro/Psych trouble walking dizziness confusion depression anxiety  Prior Studies bone scan x-rays CT/MRI  Physicians involved in your care Primary care . Neurologist . Psychiatrist . Orthopedist .   Family History  Problem Relation Age of Onset  . Heart attack Mother 77  . Heart attack Father 25   History   Social History  . Marital Status: Married    Spouse Name: N/A    Number of Children: N/A  . Years of Education: N/A   Social History Main Topics  . Smoking status: Former Smoker    Quit date: 04/06/1973  . Smokeless tobacco: Never Used     Comment: quit smoking in 1974  . Alcohol Use: No     Comment: quit in 1974  . Drug Use: No     Comment: quit in 1974  . Sexual Activity: Yes   Other Topics Concern  . None   Social History Narrative  . None   Past Surgical History  Procedure Laterality Date  . Facial cosmetic surgery      at age 84  . Joint replacement  2012    right knee  . Nodule on groin removed  1980  . Nodule removed from thyroid   2003  . Coronary artery bypass graft  2006    3; LIMA to LAD, SVG to circumflex and SVG to PDA  . Cholecystectomy  2011  . Knee arthroscopy  2012    right   . Transurethral resection of prostate    . Cardiac catheterization  08/21/2004    LAD 40% proximal  and 60% mid vessel lesion,  60% early mid vessel stenosis of RCA, PDA w/ 70% ostial lesion  . Excisional total knee arthroplasty  02/09/2012    Procedure: EXCISIONAL TOTAL KNEE ARTHROPLASTY;  Surgeon: Raymon Mutton, MD;  Location: [facility] OR;  Service: Orthopedics;  Laterality: Right;  polyexchange right total knee versus total knee revision   Past Medical History  Diagnosis Date  . Complication of anesthesia     lungs collapsed;b/p drops  . PONV (postoperative nausea and vomiting)   . Hypertension     takes Lotensin and Metoprolol daily  . Hyperlipidemia     takes Crestor daily  . Arthritis   . Joint pain   . Joint swelling   . GERD  (gastroesophageal reflux disease)     OTC Pepcid prn  . Hx of Lyme disease   . Urinary frequency   . Nocturia   . Enlarged prostate   . Depression     but doesn't require meds for this  . CAD (coronary artery disease) 04/23/2006    Echo - LV systolic fcn normal  . CAD (coronary artery disease) 09/16/2004    Echo -large (4cm) pericardial effusion predominatly along posterior portion, no evidence of tamponade  . S/P CABG x 3 05/30/10 stress test    norm perfusion all regions, EF61%, EKG neg for ischemia  . Cerebral atherosclerosis 04/23/2011    R ICA low end 0-49% reduction, L ICA high end 0-49% reduction, subclavian and vertebral arteries antegrade and patent  . Claudication 04/22/2011    normal LE arterial evaluation   BP 154/80  Pulse 80  Resp 14  Ht 6\' 1"  (1.854 m)  Wt 214 lb (97.07 kg)  BMI 28.24 kg/m2  SpO2 99%  Opioid Risk Score: 4 Fall Risk Score: Moderate Fall Risk (6-13 points) (patient educated handout given)   Review of Systems  Respiratory: Positive for shortness of breath.   Genitourinary: Positive for dysuria.  Musculoskeletal: Positive for gait problem.  Neurological: Positive for dizziness.  Psychiatric/Behavioral: Positive for confusion and dysphoric mood. The patient is nervous/anxious.   All other systems reviewed and are negative.      Objective:   Physical Exam  Nursing note and vitals reviewed. Constitutional: He is oriented to person, place, and time. He appears well-developed and well-nourished.  HENT:  Head: Normocephalic and atraumatic.  Eyes: Pupils are equal, round, and reactive to light.  Neck: Normal range of motion.  Musculoskeletal:  Gait Stiff legged on right side Tenderness to palpation starting at the 6:00 position of the patella and continuing to the 12:00 position of the patella. No tenderness in the lateral thigh no tenderness or hypersensitivity in the medial thigh there is numbness in the lateral proximal leg on the right side but  not along the medial leg. No numbness or tenderness along the medial ankle area.  Knee range of motion Right full extension Right 105 flexion  Left full extension Left 138 flexion  No evidence of erythema No evidence of knee effusion  Negative straight leg raise Motor strength 5/5 bilateral deltoid, biceps, triceps and grip 5/5 left hip flexor knee extensor ankle dorsiflex plantar flexion 4/5 right hip flexor knee extensor ankle dorsiflexor plantar flexor there is some pain inhibition  Pedal pulses diminished Posterior tibial pulses normal  Neurological: He is alert and oriented to person, place, and time.  Psychiatric: He has a normal mood and affect.          Assessment & Plan:  1. Chronic  postoperative pain right knee status post total knee replacement. 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 Mainly on the lateral aspect of the patellar region. We discussed treatment options. He has not responded well to narcotic analgesic medications. He also has not had any significant relief with either Lyrica or Neurontin. Will order lidocaine ointment to be applied 3 times a day Trial of genicular nerve blocks under fluoroscopic guidance.  Discussed with patient agrees with plan

## 2013-12-13 ENCOUNTER — Ambulatory Visit: Payer: Medicare Other | Admitting: Physical Medicine & Rehabilitation

## 2013-12-19 ENCOUNTER — Encounter: Payer: Self-pay | Admitting: Physical Medicine & Rehabilitation

## 2013-12-19 ENCOUNTER — Ambulatory Visit (HOSPITAL_BASED_OUTPATIENT_CLINIC_OR_DEPARTMENT_OTHER): Payer: Medicare Other | Admitting: Physical Medicine & Rehabilitation

## 2013-12-19 ENCOUNTER — Encounter: Payer: Medicare Other | Attending: Physical Medicine & Rehabilitation

## 2013-12-19 VITALS — BP 154/75 | HR 103 | Resp 14 | Wt 203.0 lb

## 2013-12-19 DIAGNOSIS — E785 Hyperlipidemia, unspecified: Secondary | ICD-10-CM | POA: Diagnosis not present

## 2013-12-19 DIAGNOSIS — M25559 Pain in unspecified hip: Secondary | ICD-10-CM | POA: Diagnosis not present

## 2013-12-19 DIAGNOSIS — I10 Essential (primary) hypertension: Secondary | ICD-10-CM | POA: Diagnosis not present

## 2013-12-19 DIAGNOSIS — N400 Enlarged prostate without lower urinary tract symptoms: Secondary | ICD-10-CM | POA: Diagnosis not present

## 2013-12-19 DIAGNOSIS — Z951 Presence of aortocoronary bypass graft: Secondary | ICD-10-CM | POA: Insufficient documentation

## 2013-12-19 DIAGNOSIS — I2510 Atherosclerotic heart disease of native coronary artery without angina pectoris: Secondary | ICD-10-CM | POA: Insufficient documentation

## 2013-12-19 DIAGNOSIS — Z79899 Other long term (current) drug therapy: Secondary | ICD-10-CM | POA: Diagnosis not present

## 2013-12-19 DIAGNOSIS — K219 Gastro-esophageal reflux disease without esophagitis: Secondary | ICD-10-CM | POA: Insufficient documentation

## 2013-12-19 DIAGNOSIS — G8918 Other acute postprocedural pain: Secondary | ICD-10-CM | POA: Diagnosis not present

## 2013-12-19 DIAGNOSIS — G8928 Other chronic postprocedural pain: Secondary | ICD-10-CM | POA: Insufficient documentation

## 2013-12-19 DIAGNOSIS — M25569 Pain in unspecified knee: Secondary | ICD-10-CM | POA: Diagnosis present

## 2013-12-19 DIAGNOSIS — Z96659 Presence of unspecified artificial knee joint: Secondary | ICD-10-CM | POA: Diagnosis not present

## 2013-12-19 MED ORDER — TOPIRAMATE 50 MG PO TABS: 50.0000 mg | ORAL_TABLET | Freq: Two times a day (BID) | ORAL | Status: AC

## 2013-12-19 NOTE — Progress Notes (Signed)
Subjective:    Patient ID: Jeremiah Rodriguez, male    DOB: Jan 22, 1946, 68 y.o.   MRN: [id number]  HPI Chief complaint right knee pain.intermittent day and night  History. History of right knee arthritis underwent total knee replacement in November of 2012 first by Dr. Priscille Kluver. Had a second revision procedure done by Dr. Sherlean Foot synovectomy as well as exchange of poly-liner on 02/09/2012. This did not help with him.  Underwent evaluation by Dr. Otelia Sergeant to on 08/18/2013. Did not feel that any surgical intervention would be helpful.  Patient has had EMG showing a possible L5 or S1 radicular finding but no findings at L3 or L4. Discogram did not replicate pain. Lumbar epidural steroid injections were not helpful.  No new issues. No relief from lidocaine cream Topiramate with mild improvements Pain is described as burning and increases even with touching the area, worse at night Pain Inventory Average Pain 6 Pain Right Now 7 My pain is sharp, burning, stabbing, tingling and aching  In the last 24 hours, has pain interfered with the following? General activity 3 Relation with others 3 Enjoyment of life 3 What TIME of day is your pain at its worst? daytime and night Sleep (in general) Poor  Pain is worse with: walking, bending, inactivity and standing Pain improves with: n/a Relief from Meds: n/a  Mobility walk without assistance  Function retired  Neuro/Psych bladder control problems bowel control problems confusion depression  Prior Studies Any changes since last visit?  no  Physicians involved in your care Any changes since last visit?  no   Family History  Problem Relation Age of Onset  . Heart attack Mother 99  . Heart attack Father 83   History   Social History  . Marital Status: Married    Spouse Name: N/A    Number of Children: N/A  . Years of Education: N/A   Social History Main Topics  . Smoking status: Former Smoker    Quit date: 04/06/1973  .  Smokeless tobacco: Never Used     Comment: quit smoking in 1974  . Alcohol Use: No     Comment: quit in 1974  . Drug Use: No     Comment: quit in 1974  . Sexual Activity: Yes   Other Topics Concern  . None   Social History Narrative  . None   Past Surgical History  Procedure Laterality Date  . Facial cosmetic surgery      at age 60  . Joint replacement  2012    right knee  . Nodule on groin removed  1980  . Nodule removed from thyroid   2003  . Coronary artery bypass graft  2006    3; LIMA to LAD, SVG to circumflex and SVG to PDA  . Cholecystectomy  2011  . Knee arthroscopy  2012    right   . Transurethral resection of prostate    . Cardiac catheterization  08/21/2004    LAD 40% proximal and 60% mid vessel lesion,  60% early mid vessel stenosis of RCA, PDA w/ 70% ostial lesion  . Excisional total knee arthroplasty  02/09/2012    Procedure: EXCISIONAL TOTAL KNEE ARTHROPLASTY;  Surgeon: Raymon Mutton, MD;  Location: [facility] OR;  Service: Orthopedics;  Laterality: Right;  polyexchange right total knee versus total knee revision   Past Medical History  Diagnosis Date  . Complication of anesthesia     lungs collapsed;b/p drops  . PONV (postoperative nausea and vomiting)   .  Hypertension     takes Lotensin and Metoprolol daily  . Hyperlipidemia     takes Crestor daily  . Arthritis   . Joint pain   . Joint swelling   . GERD (gastroesophageal reflux disease)     OTC Pepcid prn  . Hx of Lyme disease   . Urinary frequency   . Nocturia   . Enlarged prostate   . Depression     but doesn't require meds for this  . CAD (coronary artery disease) 04/23/2006    Echo - LV systolic fcn normal  . CAD (coronary artery disease) 09/16/2004    Echo -large (4cm) pericardial effusion predominatly along posterior portion, no evidence of tamponade  . S/P CABG x 3 05/30/10 stress test    norm perfusion all regions, EF61%, EKG neg for ischemia  . Cerebral atherosclerosis 04/23/2011    R ICA low  end 0-49% reduction, L ICA high end 0-49% reduction, subclavian and vertebral arteries antegrade and patent  . Claudication 04/22/2011    normal LE arterial evaluation   BP 154/75  Pulse 103  Resp 14  Wt 203 lb (92.08 kg)  SpO2 97%  Opioid Risk Score:   Fall Risk Score: Moderate Fall Risk (6-13 points) (previoulsy educated )  Review of Systems  Constitutional: Positive for appetite change and unexpected weight change.  Gastrointestinal: Positive for diarrhea.       Bowel Control Problems  Genitourinary: Positive for difficulty urinating.       Bladder control problems  Psychiatric/Behavioral: Positive for confusion and dysphoric mood.  All other systems reviewed and are negative.      Objective:   Physical Exam  Pain with light palpation lateral aspect of patella greater than medial aspect. Well healed surgical incision No evidence of knee effusion No evidence of erythema Knee range of motion full extension, flexion approximately 100 Pain with ambulation      Assessment & Plan:  1. Chronic postoperative pain right knee status post total knee replacement. 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 Mainly on the lateral aspect of the patellar region.  We discussed treatment options. He has not responded well to narcotic analgesic medications. He also has not had any significant relief with either Lyrica or Neurontin.  Minimal improvements with topiramate. Will increased dose to 50 mg twice a day, no side effects from this medicine thus far at 25 mg. Discontinue lidocaine ointment  Trial of genicular nerve blocks under fluoroscopic guidance. I demonstrated the technique using slides.

## 2013-12-19 NOTE — Patient Instructions (Signed)
Increase dose of topiramate Next visit for injection Genicular nerve

## 2014-01-24 ENCOUNTER — Ambulatory Visit (HOSPITAL_BASED_OUTPATIENT_CLINIC_OR_DEPARTMENT_OTHER): Payer: Medicare Other | Admitting: Physical Medicine & Rehabilitation

## 2014-01-24 ENCOUNTER — Encounter: Payer: Self-pay | Admitting: Physical Medicine & Rehabilitation

## 2014-01-24 ENCOUNTER — Encounter: Payer: Medicare Other | Attending: Physical Medicine & Rehabilitation

## 2014-01-24 VITALS — BP 139/74 | HR 79 | Resp 14

## 2014-01-24 DIAGNOSIS — G8928 Other chronic postprocedural pain: Secondary | ICD-10-CM

## 2014-01-24 DIAGNOSIS — G5791 Unspecified mononeuropathy of right lower limb: Secondary | ICD-10-CM | POA: Insufficient documentation

## 2014-01-24 DIAGNOSIS — M792 Neuralgia and neuritis, unspecified: Secondary | ICD-10-CM | POA: Insufficient documentation

## 2014-01-24 NOTE — Patient Instructions (Signed)
If 50% of your pain goes away at least temporarily then we will repeat this procedure

## 2014-01-24 NOTE — Progress Notes (Signed)
Right Genicular nerve block x 3, Upper medial, Upper lateral , and Lower Medial under fluoroscopic guidance  Indication Chronic post operative pain in the Right Knee, pain postop total knee replacement which has not responded to conservative management such as physical therapy and medication management  Informed consent was obtained after describing risks and to the procedure to the patient these include bleeding bruising and infection, patient elects to proceed and has given written consent. Patient placed supine on the fluoroscopy table AP images of the knee joint were obtained. A 25-gauge 1.5 inch needle was used to anesthetize the skin and subcutaneous tissue with 1% lidocaine, 1 cc at each of 3 locations. Then a 22-gauge 3.5" spinal needle was inserted targeting the junction of the medial flare of the tibia with the shaft of the tibia, bone contact made and confirmed with lateral imaging. Then Omnipaque 180 x0.5 mL demonstrated no intravascular uptake followed by injection of a solution containing 1 cc of 6 mg per cc Celestone and 5 cc of 1% lidocaine. Then the junction of the medial epicondyles of the femur with the femoral shaft was targeted needle was advanced under fluoroscopic guidance until bone contact. Appropriate depth was obtained and confirmed with lateral images. Then Omnipaque 180 x0.5 mL demonstrated no intravascular uptake followed by injection of 2 cc of the Celestone lidocaine solution. Then the junction of the lateral femoral condyle with the femoral shaft was targeted. 22-gauge 3.5 inch needle was advanced under fluoroscopic guidance until bone contact. Appropriate depth was confirmed with lateral imaging. 0.5 mL of Omnipaque 180 injected followed by injection of 2 cc of the Celestone lidocaine solution. Patient tolerated procedure well. Post procedure instructions given

## 2014-01-24 NOTE — Progress Notes (Signed)
  PROCEDURE RECORD [facility]   Name: Jeremiah DanesWilliam L Bacus DOB:10/02/1945 MRN: [id number]  Date:01/24/2014  Physician: Claudette LawsAndrew Kirsteins, MD    Nurse/CMA: Tarryn Bogdan RN  Allergies: No Known Allergies  Consent Signed: Yes.    Is patient diabetic? No.  CBG today?   Pregnant: No. LMP: No LMP for male patient. (age 69-55)  Anticoagulants: no Anti-inflammatory: no Antibiotics: no  Procedure: right genicular nerve block Position: Supine Start Time:3:09  End Time:  Fluoro Time: 20sec RN/CMA Designer, multimediahumaker RN Maesyn Frisinger RN    Time 14:53 15:26    BP 139/74 171/92    Pulse 79 100    Respirations 14 14    O2 Sat 97 96    S/S 6 6    Pain Level 5-6/10 4/10     D/C home with wife, patient A & O X 3, D/C instructions reviewed, and sits independently.

## 2014-02-21 ENCOUNTER — Encounter: Payer: Medicare Other | Attending: Physical Medicine & Rehabilitation

## 2014-02-21 ENCOUNTER — Ambulatory Visit: Payer: Medicare Other | Admitting: Physical Medicine & Rehabilitation

## 2014-02-21 DIAGNOSIS — G5791 Unspecified mononeuropathy of right lower limb: Secondary | ICD-10-CM | POA: Insufficient documentation

## 2014-02-21 DIAGNOSIS — G8928 Other chronic postprocedural pain: Secondary | ICD-10-CM | POA: Insufficient documentation

## 2014-03-08 ENCOUNTER — Other Ambulatory Visit: Payer: Self-pay | Admitting: Specialist

## 2014-03-08 DIAGNOSIS — M25561 Pain in right knee: Secondary | ICD-10-CM

## 2016-11-11 ENCOUNTER — Ambulatory Visit
Admission: RE | Admit: 2016-11-11 | Discharge: 2016-11-11 | Payer: MEDICARE | Attending: Cardiovascular Disease | Admitting: Cardiovascular Disease

## 2016-11-11 DIAGNOSIS — R000 Tachycardia, unspecified: Principal | ICD-10-CM

## 2016-11-28 MED ORDER — METOPROLOL SUCCINATE ER 50 MG TABLET,EXTENDED RELEASE 24 HR
ORAL_TABLET | Freq: Every day | ORAL | 3 refills | 0 days | Status: CP
Start: 2016-11-28 — End: 2017-05-06

## 2016-12-03 ENCOUNTER — Ambulatory Visit
Admission: RE | Admit: 2016-12-03 | Discharge: 2016-12-03 | Disposition: A | Discharge status: Home / Self Care | Payer: MEDICARE | Attending: Family Medicine | Admitting: Family Medicine

## 2016-12-03 ENCOUNTER — Ambulatory Visit
Admission: RE | Admit: 2016-12-03 | Discharge: 2016-12-03 | Disposition: A | Discharge status: Home / Self Care | Payer: MEDICARE

## 2016-12-03 DIAGNOSIS — E538 Deficiency of other specified B group vitamins: Secondary | ICD-10-CM

## 2016-12-03 DIAGNOSIS — F332 Major depressive disorder, recurrent severe without psychotic features: Secondary | ICD-10-CM

## 2016-12-03 DIAGNOSIS — M542 Cervicalgia: Secondary | ICD-10-CM

## 2016-12-03 DIAGNOSIS — Z7189 Other specified counseling: Secondary | ICD-10-CM

## 2016-12-03 DIAGNOSIS — G8929 Other chronic pain: Secondary | ICD-10-CM

## 2016-12-03 DIAGNOSIS — E785 Hyperlipidemia, unspecified: Secondary | ICD-10-CM

## 2016-12-03 DIAGNOSIS — Z0000 Encounter for general adult medical examination without abnormal findings: Principal | ICD-10-CM

## 2016-12-03 DIAGNOSIS — I10 Essential (primary) hypertension: Principal | ICD-10-CM

## 2016-12-03 DIAGNOSIS — N189 Chronic kidney disease, unspecified: Secondary | ICD-10-CM

## 2016-12-03 DIAGNOSIS — I240 Acute coronary thrombosis not resulting in myocardial infarction: Secondary | ICD-10-CM

## 2016-12-09 MED ORDER — ROSUVASTATIN 40 MG TABLET
ORAL_TABLET | Freq: Every day | ORAL | 5 refills | 0.00000 days | Status: CP
Start: 2016-12-09 — End: 2017-05-28

## 2017-05-06 MED ORDER — METOPROLOL SUCCINATE ER 50 MG TABLET,EXTENDED RELEASE 24 HR
ORAL_TABLET | Freq: Every day | ORAL | 5 refills | 0.00000 days | Status: CP
Start: 2017-05-06 — End: 2017-09-08

## 2017-05-28 MED ORDER — ROSUVASTATIN 40 MG TABLET
ORAL_TABLET | Freq: Every day | ORAL | 1 refills | 0 days | Status: CP
Start: 2017-05-28 — End: 2017-12-04

## 2017-06-04 ENCOUNTER — Encounter: Admit: 2017-06-04 | Discharge: 2017-06-05 | Payer: MEDICARE | Attending: Family Medicine | Primary: Family Medicine

## 2017-06-04 DIAGNOSIS — G8929 Other chronic pain: Secondary | ICD-10-CM

## 2017-06-04 DIAGNOSIS — R197 Diarrhea, unspecified: Secondary | ICD-10-CM

## 2017-06-04 DIAGNOSIS — Z1211 Encounter for screening for malignant neoplasm of colon: Secondary | ICD-10-CM

## 2017-06-04 DIAGNOSIS — I10 Essential (primary) hypertension: Principal | ICD-10-CM

## 2017-06-04 DIAGNOSIS — M542 Cervicalgia: Secondary | ICD-10-CM

## 2017-06-04 DIAGNOSIS — E538 Deficiency of other specified B group vitamins: Secondary | ICD-10-CM

## 2017-06-04 DIAGNOSIS — E785 Hyperlipidemia, unspecified: Secondary | ICD-10-CM

## 2017-06-04 DIAGNOSIS — N189 Chronic kidney disease, unspecified: Secondary | ICD-10-CM

## 2017-06-04 MED ORDER — AMLODIPINE 5 MG TABLET
ORAL_TABLET | Freq: Every day | ORAL | 3 refills | 0.00000 days | Status: CP
Start: 2017-06-04 — End: 2018-06-03

## 2017-06-10 ENCOUNTER — Encounter: Admit: 2017-06-10 | Discharge: 2017-06-11 | Payer: MEDICARE

## 2017-06-10 DIAGNOSIS — Z1211 Encounter for screening for malignant neoplasm of colon: Principal | ICD-10-CM

## 2017-06-17 ENCOUNTER — Encounter: Admit: 2017-06-17 | Discharge: 2017-06-18 | Payer: MEDICARE

## 2017-06-17 ENCOUNTER — Encounter: Admit: 2017-06-17 | Discharge: 2017-06-17 | Payer: MEDICARE

## 2017-06-17 DIAGNOSIS — M5030 Other cervical disc degeneration, unspecified cervical region: Principal | ICD-10-CM

## 2017-06-25 ENCOUNTER — Encounter: Admit: 2017-06-25 | Discharge: 2017-06-26 | Payer: MEDICARE | Attending: Family Medicine | Primary: Family Medicine

## 2017-06-25 DIAGNOSIS — E538 Deficiency of other specified B group vitamins: Secondary | ICD-10-CM

## 2017-06-25 DIAGNOSIS — R739 Hyperglycemia, unspecified: Secondary | ICD-10-CM

## 2017-06-25 DIAGNOSIS — R7303 Prediabetes: Principal | ICD-10-CM

## 2017-06-25 MED ORDER — CYANOCOBALAMIN (VIT B-12) 1,000 MCG/ML INJECTION SOLUTION
3 refills | 0 days | Status: CP
Start: 2017-06-25 — End: 2017-08-21

## 2017-06-25 MED ORDER — METFORMIN 500 MG TABLET
ORAL_TABLET | Freq: Two times a day (BID) | ORAL | 3 refills | 0.00000 days | Status: CP
Start: 2017-06-25 — End: 2017-08-21

## 2017-08-19 ENCOUNTER — Encounter
Admit: 2017-08-19 | Discharge: 2017-08-20 | Payer: MEDICARE | Attending: Cardiovascular Disease | Primary: Cardiovascular Disease

## 2017-08-19 DIAGNOSIS — I483 Typical atrial flutter: Secondary | ICD-10-CM

## 2017-08-19 DIAGNOSIS — I240 Acute coronary thrombosis not resulting in myocardial infarction: Principal | ICD-10-CM

## 2017-08-19 MED ORDER — EZETIMIBE 10 MG TABLET
ORAL_TABLET | Freq: Every day | ORAL | 3 refills | 0 days | Status: CP
Start: 2017-08-19 — End: 2018-08-19

## 2017-08-21 MED ORDER — CYANOCOBALAMIN (VIT B-12) 1,000 MCG/ML INJECTION SOLUTION
1 refills | 0 days | Status: CP
Start: 2017-08-21 — End: ?

## 2017-08-21 MED ORDER — METFORMIN 500 MG TABLET
ORAL_TABLET | 1 refills | 0 days | Status: CP
Start: 2017-08-21 — End: 2017-12-05

## 2017-09-08 MED ORDER — METOPROLOL SUCCINATE ER 100 MG TABLET,EXTENDED RELEASE 24 HR
ORAL_TABLET | Freq: Every day | ORAL | 5 refills | 0.00000 days | Status: CP
Start: 2017-09-08 — End: 2018-02-26

## 2017-09-29 ENCOUNTER — Encounter: Admit: 2017-09-29 | Discharge: 2017-09-30 | Payer: MEDICARE | Attending: Family Medicine | Primary: Family Medicine

## 2017-09-29 DIAGNOSIS — F332 Major depressive disorder, recurrent severe without psychotic features: Secondary | ICD-10-CM

## 2017-09-29 DIAGNOSIS — R7303 Prediabetes: Principal | ICD-10-CM

## 2017-09-29 DIAGNOSIS — I10 Essential (primary) hypertension: Secondary | ICD-10-CM

## 2017-09-29 DIAGNOSIS — Z125 Encounter for screening for malignant neoplasm of prostate: Secondary | ICD-10-CM

## 2017-09-29 DIAGNOSIS — I240 Acute coronary thrombosis not resulting in myocardial infarction: Secondary | ICD-10-CM

## 2017-09-29 DIAGNOSIS — N189 Chronic kidney disease, unspecified: Secondary | ICD-10-CM

## 2017-09-29 DIAGNOSIS — E785 Hyperlipidemia, unspecified: Secondary | ICD-10-CM

## 2017-09-29 DIAGNOSIS — E538 Deficiency of other specified B group vitamins: Secondary | ICD-10-CM

## 2017-11-04 ENCOUNTER — Ambulatory Visit: Admit: 2017-11-04 | Discharge: 2017-11-05 | Payer: MEDICARE | Attending: Family | Primary: Family

## 2017-11-04 ENCOUNTER — Encounter: Admit: 2017-11-04 | Discharge: 2017-11-05 | Payer: MEDICARE | Attending: Family | Primary: Family

## 2017-11-04 DIAGNOSIS — M62838 Other muscle spasm: Secondary | ICD-10-CM

## 2017-11-04 DIAGNOSIS — B029 Zoster without complications: Principal | ICD-10-CM

## 2017-11-04 DIAGNOSIS — E538 Deficiency of other specified B group vitamins: Secondary | ICD-10-CM

## 2017-11-06 MED ORDER — ERGOCALCIFEROL (VITAMIN D2) 1,250 MCG (50,000 UNIT) CAPSULE
ORAL_CAPSULE | ORAL | 1 refills | 0 days | Status: CP
Start: 2017-11-06 — End: 2017-11-29

## 2017-12-01 MED ORDER — ERGOCALCIFEROL (VITAMIN D2) 1,250 MCG (50,000 UNIT) CAPSULE
ORAL_CAPSULE | 1 refills | 0 days | Status: CP
Start: 2017-12-01 — End: 2018-05-11

## 2017-12-04 MED ORDER — ROSUVASTATIN 40 MG TABLET
ORAL_TABLET | 1 refills | 0 days | Status: CP
Start: 2017-12-04 — End: 2018-07-12

## 2017-12-09 ENCOUNTER — Encounter: Admit: 2017-12-09 | Discharge: 2017-12-10 | Payer: MEDICARE

## 2017-12-09 DIAGNOSIS — Z0000 Encounter for general adult medical examination without abnormal findings: Secondary | ICD-10-CM

## 2017-12-09 DIAGNOSIS — Z6831 Body mass index (BMI) 31.0-31.9, adult: Principal | ICD-10-CM

## 2017-12-09 MED ORDER — METFORMIN 500 MG TABLET
ORAL_TABLET | 1 refills | 0 days | Status: CP
Start: 2017-12-09 — End: 2018-05-11

## 2018-03-02 MED ORDER — METOPROLOL SUCCINATE ER 100 MG TABLET,EXTENDED RELEASE 24 HR
ORAL_TABLET | 1 refills | 0 days | Status: CP
Start: 2018-03-02 — End: ?

## 2018-04-13 ENCOUNTER — Encounter
Admit: 2018-04-13 | Discharge: 2018-04-14 | Payer: MEDICARE | Attending: Cardiovascular Disease | Primary: Cardiovascular Disease

## 2018-04-13 DIAGNOSIS — I240 Acute coronary thrombosis not resulting in myocardial infarction: Principal | ICD-10-CM

## 2018-05-11 ENCOUNTER — Encounter: Admit: 2018-05-11 | Discharge: 2018-05-12 | Payer: MEDICARE | Attending: Family Medicine | Primary: Family Medicine

## 2018-05-11 DIAGNOSIS — E785 Hyperlipidemia, unspecified: Secondary | ICD-10-CM

## 2018-05-11 DIAGNOSIS — I10 Essential (primary) hypertension: Secondary | ICD-10-CM

## 2018-05-11 DIAGNOSIS — N189 Chronic kidney disease, unspecified: Secondary | ICD-10-CM

## 2018-05-11 DIAGNOSIS — R42 Dizziness and giddiness: Secondary | ICD-10-CM

## 2018-05-11 DIAGNOSIS — R079 Chest pain, unspecified: Secondary | ICD-10-CM

## 2018-05-11 DIAGNOSIS — E119 Type 2 diabetes mellitus without complications: Principal | ICD-10-CM

## 2018-05-11 DIAGNOSIS — R7303 Prediabetes: Principal | ICD-10-CM

## 2018-05-11 DIAGNOSIS — E538 Deficiency of other specified B group vitamins: Secondary | ICD-10-CM

## 2018-05-11 MED ORDER — PREDNISONE 20 MG TABLET
ORAL_TABLET | Freq: Every day | ORAL | 0 refills | 0.00000 days | Status: CP
Start: 2018-05-11 — End: 2018-05-15

## 2018-05-11 MED ORDER — ERGOCALCIFEROL (VITAMIN D2) 1,250 MCG (50,000 UNIT) CAPSULE
ORAL_CAPSULE | ORAL | 1 refills | 0 days | Status: CP
Start: 2018-05-11 — End: 2018-07-13

## 2018-05-11 MED ORDER — METFORMIN 500 MG TABLET
ORAL_TABLET | Freq: Two times a day (BID) | ORAL | 5 refills | 0 days | Status: CP
Start: 2018-05-11 — End: ?

## 2018-06-03 MED ORDER — AMLODIPINE 5 MG TABLET
ORAL_TABLET | 3 refills | 0 days | Status: CP
Start: 2018-06-03 — End: ?

## 2018-06-10 ENCOUNTER — Encounter: Admit: 2018-06-10 | Discharge: 2018-06-11 | Payer: MEDICARE

## 2018-06-10 ENCOUNTER — Ambulatory Visit: Admit: 2018-06-10 | Discharge: 2018-06-10 | Payer: MEDICARE

## 2018-06-10 DIAGNOSIS — Z9489 Other transplanted organ and tissue status: Principal | ICD-10-CM

## 2018-06-10 DIAGNOSIS — M25561 Pain in right knee: Principal | ICD-10-CM

## 2018-06-10 DIAGNOSIS — M1712 Unilateral primary osteoarthritis, left knee: Principal | ICD-10-CM

## 2018-06-10 DIAGNOSIS — Z96651 Presence of right artificial knee joint: Principal | ICD-10-CM

## 2018-07-12 MED ORDER — ROSUVASTATIN 40 MG TABLET
ORAL_TABLET | Freq: Every day | ORAL | 1 refills | 0 days | Status: CP
Start: 2018-07-12 — End: ?

## 2018-07-13 MED ORDER — ERGOCALCIFEROL (VITAMIN D2) 1,250 MCG (50,000 UNIT) CAPSULE
ORAL_CAPSULE | 1 refills | 0 days | Status: CP
Start: 2018-07-13 — End: ?

## 2019-01-25 ENCOUNTER — Encounter: Admit: 2019-01-25 | Discharge: 2019-01-26 | Payer: MEDICARE

## 2019-02-01 ENCOUNTER — Encounter: Admit: 2019-02-01 | Discharge: 2019-02-01 | Payer: MEDICARE | Attending: Family Medicine | Primary: Family Medicine

## 2019-02-01 ENCOUNTER — Encounter: Admit: 2019-02-01 | Discharge: 2019-02-01 | Payer: MEDICARE

## 2019-02-01 DIAGNOSIS — E119 Type 2 diabetes mellitus without complications: Principal | ICD-10-CM

## 2019-02-01 DIAGNOSIS — M79602 Pain in left arm: Principal | ICD-10-CM

## 2019-02-01 DIAGNOSIS — M792 Neuralgia and neuritis, unspecified: Principal | ICD-10-CM

## 2019-02-01 DIAGNOSIS — E538 Deficiency of other specified B group vitamins: Principal | ICD-10-CM

## 2019-02-01 DIAGNOSIS — I240 Acute coronary thrombosis not resulting in myocardial infarction: Principal | ICD-10-CM

## 2019-02-01 DIAGNOSIS — E559 Vitamin D deficiency, unspecified: Principal | ICD-10-CM

## 2019-02-01 DIAGNOSIS — N189 Chronic kidney disease, unspecified: Principal | ICD-10-CM

## 2019-02-01 DIAGNOSIS — E785 Hyperlipidemia, unspecified: Principal | ICD-10-CM

## 2019-02-01 DIAGNOSIS — I10 Essential (primary) hypertension: Principal | ICD-10-CM

## 2019-02-01 DIAGNOSIS — F332 Major depressive disorder, recurrent severe without psychotic features: Principal | ICD-10-CM

## 2019-02-01 MED ORDER — GABAPENTIN 300 MG CAPSULE
ORAL_CAPSULE | 2 refills | 0 days | Status: CP
Start: 2019-02-01 — End: ?

## 2019-02-01 MED ORDER — ESCITALOPRAM 10 MG TABLET
ORAL_TABLET | Freq: Every day | ORAL | 2 refills | 30 days | Status: CP
Start: 2019-02-01 — End: 2020-02-01

## 2019-02-01 MED ORDER — CYANOCOBALAMIN (VIT B-12) 1,000 MCG/ML INJECTION SOLUTION
1 refills | 0 days | Status: CP
Start: 2019-02-01 — End: ?

## 2019-02-23 DIAGNOSIS — F332 Major depressive disorder, recurrent severe without psychotic features: Principal | ICD-10-CM

## 2019-02-24 MED ORDER — ESCITALOPRAM 10 MG TABLET
ORAL_TABLET | 1 refills | 0 days | Status: CP
Start: 2019-02-24 — End: ?

## 2019-04-12 ENCOUNTER — Ambulatory Visit: Admit: 2019-04-12 | Discharge: 2019-04-13 | Payer: MEDICARE | Attending: Family Medicine | Primary: Family Medicine

## 2019-04-12 DIAGNOSIS — E559 Vitamin D deficiency, unspecified: Principal | ICD-10-CM

## 2019-04-12 DIAGNOSIS — I10 Essential (primary) hypertension: Principal | ICD-10-CM

## 2019-04-12 DIAGNOSIS — M79602 Pain in left arm: Principal | ICD-10-CM

## 2019-04-12 MED ORDER — PREDNISONE 20 MG TABLET
ORAL_TABLET | Freq: Every day | ORAL | 0 refills | 4.00000 days | Status: CP
Start: 2019-04-12 — End: 2019-04-16

## 2019-04-12 MED ORDER — ERGOCALCIFEROL (VITAMIN D2) 1,250 MCG (50,000 UNIT) CAPSULE
ORAL_CAPSULE | ORAL | 5 refills | 28 days | Status: CP
Start: 2019-04-12 — End: ?

## 2019-04-12 MED ORDER — HYDROCODONE 5 MG-ACETAMINOPHEN 325 MG TABLET
ORAL_TABLET | Freq: Four times a day (QID) | ORAL | 0 refills | 5.00000 days | Status: CP | PRN
Start: 2019-04-12 — End: ?

## 2019-04-12 MED ORDER — PREDNISONE 20 MG TABLET: 40 mg | tablet | Freq: Every day | 0 refills | 4 days | Status: AC

## 2019-04-28 DIAGNOSIS — M792 Neuralgia and neuritis, unspecified: Principal | ICD-10-CM

## 2019-04-29 MED ORDER — GABAPENTIN 300 MG CAPSULE
ORAL_CAPSULE | 2 refills | 0 days | Status: CP
Start: 2019-04-29 — End: ?

## 2019-05-17 ENCOUNTER — Ambulatory Visit
Admit: 2019-05-17 | Discharge: 2019-05-18 | Payer: MEDICARE | Attending: Cardiovascular Disease | Primary: Cardiovascular Disease

## 2019-05-17 DIAGNOSIS — R002 Palpitations: Principal | ICD-10-CM

## 2019-05-17 DIAGNOSIS — R000 Tachycardia, unspecified: Principal | ICD-10-CM

## 2019-05-17 MED ORDER — FUROSEMIDE 20 MG TABLET
ORAL_TABLET | Freq: Two times a day (BID) | ORAL | 1 refills | 90 days | Status: CP
Start: 2019-05-17 — End: ?

## 2019-05-17 MED ORDER — METOPROLOL SUCCINATE ER 100 MG TABLET,EXTENDED RELEASE 24 HR
ORAL_TABLET | Freq: Every day | ORAL | 1 refills | 90.00000 days | Status: CP
Start: 2019-05-17 — End: 2019-08-15

## 2019-05-17 MED ORDER — ROSUVASTATIN 40 MG TABLET
ORAL_TABLET | Freq: Every day | ORAL | 1 refills | 90 days | Status: CP
Start: 2019-05-17 — End: ?

## 2019-06-09 ENCOUNTER — Ambulatory Visit: Admit: 2019-06-09 | Discharge: 2019-06-10 | Payer: MEDICARE | Attending: Family Medicine | Primary: Family Medicine

## 2019-06-09 DIAGNOSIS — M2550 Pain in unspecified joint: Principal | ICD-10-CM

## 2019-06-09 DIAGNOSIS — E785 Hyperlipidemia, unspecified: Principal | ICD-10-CM

## 2019-06-09 DIAGNOSIS — E119 Type 2 diabetes mellitus without complications: Principal | ICD-10-CM

## 2019-06-09 DIAGNOSIS — F329 Major depressive disorder, single episode, unspecified: Principal | ICD-10-CM

## 2019-06-09 DIAGNOSIS — I10 Essential (primary) hypertension: Principal | ICD-10-CM

## 2019-06-09 DIAGNOSIS — E559 Vitamin D deficiency, unspecified: Principal | ICD-10-CM

## 2019-06-09 MED ORDER — MISCELLANEOUS MEDICAL SUPPLY MISC: each | 0 refills | 0 days | Status: AC

## 2019-06-09 MED ORDER — ESCITALOPRAM 20 MG TABLET
ORAL_TABLET | Freq: Every day | ORAL | 1 refills | 90.00000 days | Status: CP
Start: 2019-06-09 — End: 2020-06-08

## 2019-06-09 MED ORDER — MISCELLANEOUS MEDICAL SUPPLY MISC
1 refills | 0.00000 days | Status: CP
Start: 2019-06-09 — End: ?

## 2019-06-09 MED ORDER — MISCELLANEOUS MEDICAL SUPPLY MISC: each | 1 refills | 0 days | Status: AC

## 2019-06-09 MED ORDER — MELOXICAM 7.5 MG TABLET
ORAL_TABLET | Freq: Two times a day (BID) | ORAL | 3 refills | 20 days | Status: CP | PRN
Start: 2019-06-09 — End: 2020-06-08

## 2019-06-24 DIAGNOSIS — I10 Essential (primary) hypertension: Principal | ICD-10-CM

## 2019-06-24 MED ORDER — AMLODIPINE 5 MG TABLET
ORAL_TABLET | 3 refills | 0 days | Status: CP
Start: 2019-06-24 — End: ?

## 2019-07-13 ENCOUNTER — Ambulatory Visit: Admit: 2019-07-13 | Discharge: 2019-07-14 | Payer: MEDICARE

## 2019-07-13 DIAGNOSIS — G8929 Other chronic pain: Principal | ICD-10-CM

## 2019-07-13 DIAGNOSIS — E119 Type 2 diabetes mellitus without complications: Principal | ICD-10-CM

## 2019-07-13 DIAGNOSIS — M545 Low back pain: Secondary | ICD-10-CM

## 2019-07-13 DIAGNOSIS — F332 Major depressive disorder, recurrent severe without psychotic features: Principal | ICD-10-CM

## 2019-07-13 DIAGNOSIS — I5031 Acute diastolic (congestive) heart failure: Principal | ICD-10-CM

## 2019-07-13 DIAGNOSIS — I483 Typical atrial flutter: Principal | ICD-10-CM

## 2019-07-13 DIAGNOSIS — M79602 Pain in left arm: Principal | ICD-10-CM

## 2019-07-13 MED ORDER — GABAPENTIN 100 MG CAPSULE
ORAL_CAPSULE | Freq: Two times a day (BID) | ORAL | 0 refills | 30.00000 days | Status: CP
Start: 2019-07-13 — End: 2020-07-12

## 2019-08-10 ENCOUNTER — Encounter: Admit: 2019-08-10 | Discharge: 2019-08-11 | Payer: MEDICARE

## 2019-08-10 DIAGNOSIS — H04203 Unspecified epiphora, bilateral lacrimal glands: Principal | ICD-10-CM

## 2019-08-10 DIAGNOSIS — E119 Type 2 diabetes mellitus without complications: Principal | ICD-10-CM

## 2019-08-10 DIAGNOSIS — I10 Essential (primary) hypertension: Principal | ICD-10-CM

## 2019-08-10 DIAGNOSIS — F332 Major depressive disorder, recurrent severe without psychotic features: Principal | ICD-10-CM

## 2019-08-10 DIAGNOSIS — M792 Neuralgia and neuritis, unspecified: Principal | ICD-10-CM

## 2019-08-10 MED ORDER — OLOPATADINE 0.1 % EYE DROPS
Freq: Two times a day (BID) | OPHTHALMIC | 1 refills | 50.00000 days | Status: CP
Start: 2019-08-10 — End: 2020-08-09

## 2019-08-10 MED ORDER — GABAPENTIN 300 MG CAPSULE
ORAL_CAPSULE | 2 refills | 0 days | Status: CP
Start: 2019-08-10 — End: ?

## 2019-08-12 DIAGNOSIS — H04203 Unspecified epiphora, bilateral lacrimal glands: Principal | ICD-10-CM

## 2019-08-15 MED ORDER — OLOPATADINE 0.1 % EYE DROPS
Freq: Two times a day (BID) | OPHTHALMIC | 1 refills | 300.00000 days | Status: CP
Start: 2019-08-15 — End: 2020-08-14

## 2019-09-23 DIAGNOSIS — E559 Vitamin D deficiency, unspecified: Principal | ICD-10-CM

## 2019-09-23 MED ORDER — ERGOCALCIFEROL (VITAMIN D2) 1,250 MCG (50,000 UNIT) CAPSULE
ORAL_CAPSULE | 1 refills | 0 days | Status: CP
Start: 2019-09-23 — End: ?

## 2019-11-04 MED ORDER — ROSUVASTATIN 40 MG TABLET
ORAL_TABLET | 1 refills | 0 days | Status: CP
Start: 2019-11-04 — End: ?

## 2019-11-14 MED ORDER — METOPROLOL SUCCINATE ER 100 MG TABLET,EXTENDED RELEASE 24 HR
ORAL_TABLET | 1 refills | 0 days | Status: CP
Start: 2019-11-14 — End: ?

## 2019-11-28 ENCOUNTER — Encounter: Admit: 2019-11-28 | Discharge: 2019-11-29 | Payer: MEDICARE

## 2019-11-28 DIAGNOSIS — Z1159 Encounter for screening for other viral diseases: Principal | ICD-10-CM

## 2019-11-28 DIAGNOSIS — M545 Low back pain: Principal | ICD-10-CM

## 2019-11-28 DIAGNOSIS — R591 Generalized enlarged lymph nodes: Principal | ICD-10-CM

## 2019-11-28 DIAGNOSIS — F332 Major depressive disorder, recurrent severe without psychotic features: Principal | ICD-10-CM

## 2019-11-28 DIAGNOSIS — G8929 Other chronic pain: Principal | ICD-10-CM

## 2019-11-28 DIAGNOSIS — R42 Dizziness and giddiness: Principal | ICD-10-CM

## 2019-11-28 DIAGNOSIS — M79602 Pain in left arm: Principal | ICD-10-CM

## 2019-11-28 DIAGNOSIS — R5383 Other fatigue: Principal | ICD-10-CM

## 2019-11-28 DIAGNOSIS — E119 Type 2 diabetes mellitus without complications: Principal | ICD-10-CM

## 2019-11-28 DIAGNOSIS — I10 Essential (primary) hypertension: Principal | ICD-10-CM

## 2019-11-28 DIAGNOSIS — Z9181 History of falling: Principal | ICD-10-CM

## 2019-11-28 DIAGNOSIS — N189 Chronic kidney disease, unspecified: Principal | ICD-10-CM

## 2019-12-02 ENCOUNTER — Encounter: Admit: 2019-12-02 | Discharge: 2019-12-03 | Payer: MEDICARE

## 2019-12-02 DIAGNOSIS — Z1211 Encounter for screening for malignant neoplasm of colon: Principal | ICD-10-CM

## 2019-12-06 ENCOUNTER — Ambulatory Visit: Admit: 2019-12-06 | Discharge: 2019-12-07 | Payer: MEDICARE

## 2019-12-06 DIAGNOSIS — R591 Generalized enlarged lymph nodes: Principal | ICD-10-CM

## 2019-12-06 DIAGNOSIS — H903 Sensorineural hearing loss, bilateral: Principal | ICD-10-CM

## 2019-12-06 DIAGNOSIS — R1312 Dysphagia, oropharyngeal phase: Principal | ICD-10-CM

## 2019-12-06 DIAGNOSIS — K1120 Sialoadenitis, unspecified: Principal | ICD-10-CM

## 2019-12-06 DIAGNOSIS — J342 Deviated nasal septum: Principal | ICD-10-CM

## 2019-12-06 DIAGNOSIS — H8112 Benign paroxysmal vertigo, left ear: Principal | ICD-10-CM

## 2019-12-06 DIAGNOSIS — Z87891 Personal history of nicotine dependence: Principal | ICD-10-CM

## 2019-12-06 MED ORDER — OMEPRAZOLE 20 MG CAPSULE,DELAYED RELEASE
ORAL_CAPSULE | Freq: Every day | ORAL | 6 refills | 30.00000 days | Status: CP
Start: 2019-12-06 — End: 2020-01-05

## 2019-12-27 ENCOUNTER — Ambulatory Visit: Admit: 2019-12-27 | Payer: MEDICARE

## 2019-12-31 DIAGNOSIS — M792 Neuralgia and neuritis, unspecified: Principal | ICD-10-CM

## 2020-01-02 MED ORDER — GABAPENTIN 300 MG CAPSULE
ORAL_CAPSULE | 2 refills | 0 days | Status: CP
Start: 2020-01-02 — End: ?

## 2020-01-03 ENCOUNTER — Encounter: Admit: 2020-01-03 | Discharge: 2020-01-03 | Payer: MEDICARE

## 2020-01-03 DIAGNOSIS — H903 Sensorineural hearing loss, bilateral: Principal | ICD-10-CM

## 2020-01-03 DIAGNOSIS — H918X1 Other specified hearing loss, right ear: Principal | ICD-10-CM

## 2020-01-03 DIAGNOSIS — R1312 Dysphagia, oropharyngeal phase: Principal | ICD-10-CM

## 2020-01-03 DIAGNOSIS — H8112 Benign paroxysmal vertigo, left ear: Principal | ICD-10-CM

## 2020-01-03 DIAGNOSIS — J342 Deviated nasal septum: Principal | ICD-10-CM

## 2020-01-03 DIAGNOSIS — K1120 Sialoadenitis, unspecified: Principal | ICD-10-CM

## 2020-01-17 ENCOUNTER — Encounter: Admit: 2020-01-17 | Discharge: 2020-01-18 | Payer: MEDICARE

## 2020-01-17 DIAGNOSIS — H918X9 Other specified hearing loss, unspecified ear: Principal | ICD-10-CM

## 2020-01-17 DIAGNOSIS — H903 Sensorineural hearing loss, bilateral: Principal | ICD-10-CM

## 2020-01-17 DIAGNOSIS — H8112 Benign paroxysmal vertigo, left ear: Principal | ICD-10-CM

## 2020-01-28 ENCOUNTER — Ambulatory Visit: Admit: 2020-01-28 | Discharge: 2020-01-29 | Payer: MEDICARE

## 2020-03-07 ENCOUNTER — Encounter: Admit: 2020-03-07 | Discharge: 2020-03-08 | Payer: MEDICARE

## 2020-03-07 DIAGNOSIS — Z23 Encounter for immunization: Principal | ICD-10-CM

## 2020-03-07 DIAGNOSIS — M792 Neuralgia and neuritis, unspecified: Principal | ICD-10-CM

## 2020-03-07 DIAGNOSIS — M79602 Pain in left arm: Principal | ICD-10-CM

## 2020-03-07 DIAGNOSIS — F332 Major depressive disorder, recurrent severe without psychotic features: Principal | ICD-10-CM

## 2020-03-07 DIAGNOSIS — N189 Chronic kidney disease, unspecified: Principal | ICD-10-CM

## 2020-03-07 DIAGNOSIS — I10 Essential (primary) hypertension: Principal | ICD-10-CM

## 2020-03-07 DIAGNOSIS — M1811 Unilateral primary osteoarthritis of first carpometacarpal joint, right hand: Principal | ICD-10-CM

## 2020-03-07 DIAGNOSIS — E119 Type 2 diabetes mellitus without complications: Principal | ICD-10-CM

## 2020-03-08 DIAGNOSIS — E559 Vitamin D deficiency, unspecified: Principal | ICD-10-CM

## 2020-03-12 ENCOUNTER — Ambulatory Visit
Admit: 2020-03-12 | Discharge: 2020-03-12 | Disposition: A | Discharge status: Home / Self Care | Payer: MEDICARE | Admitting: Student in an Organized Health Care Education/Training Program

## 2020-03-12 DIAGNOSIS — I2510 Atherosclerotic heart disease of native coronary artery without angina pectoris: Principal | ICD-10-CM

## 2020-03-12 DIAGNOSIS — G4700 Insomnia, unspecified: Principal | ICD-10-CM

## 2020-03-12 DIAGNOSIS — Z96651 Presence of right artificial knee joint: Principal | ICD-10-CM

## 2020-03-12 DIAGNOSIS — Z7982 Long term (current) use of aspirin: Principal | ICD-10-CM

## 2020-03-12 DIAGNOSIS — N189 Chronic kidney disease, unspecified: Principal | ICD-10-CM

## 2020-03-12 DIAGNOSIS — Z634 Disappearance and death of family member: Principal | ICD-10-CM

## 2020-03-12 DIAGNOSIS — Z951 Presence of aortocoronary bypass graft: Principal | ICD-10-CM

## 2020-03-12 DIAGNOSIS — K219 Gastro-esophageal reflux disease without esophagitis: Principal | ICD-10-CM

## 2020-03-12 DIAGNOSIS — I7300 Raynaud's syndrome without gangrene: Principal | ICD-10-CM

## 2020-03-12 DIAGNOSIS — J302 Other seasonal allergic rhinitis: Principal | ICD-10-CM

## 2020-03-12 DIAGNOSIS — Z87891 Personal history of nicotine dependence: Principal | ICD-10-CM

## 2020-03-12 DIAGNOSIS — E1122 Type 2 diabetes mellitus with diabetic chronic kidney disease: Principal | ICD-10-CM

## 2020-03-12 DIAGNOSIS — F329 Major depressive disorder, single episode, unspecified: Principal | ICD-10-CM

## 2020-03-12 DIAGNOSIS — R0902 Hypoxemia: Principal | ICD-10-CM

## 2020-03-12 DIAGNOSIS — E860 Dehydration: Principal | ICD-10-CM

## 2020-03-12 DIAGNOSIS — Z8711 Personal history of peptic ulcer disease: Principal | ICD-10-CM

## 2020-03-12 DIAGNOSIS — H9190 Unspecified hearing loss, unspecified ear: Principal | ICD-10-CM

## 2020-03-12 DIAGNOSIS — Z7984 Long term (current) use of oral hypoglycemic drugs: Principal | ICD-10-CM

## 2020-03-12 DIAGNOSIS — U071 COVID-19: Principal | ICD-10-CM

## 2020-03-12 DIAGNOSIS — I129 Hypertensive chronic kidney disease with stage 1 through stage 4 chronic kidney disease, or unspecified chronic kidney disease: Principal | ICD-10-CM

## 2020-03-12 DIAGNOSIS — I5032 Chronic diastolic (congestive) heart failure: Principal | ICD-10-CM

## 2020-03-12 DIAGNOSIS — G8929 Other chronic pain: Principal | ICD-10-CM

## 2020-03-12 DIAGNOSIS — E785 Hyperlipidemia, unspecified: Principal | ICD-10-CM

## 2020-03-17 ENCOUNTER — Ambulatory Visit
Admit: 2020-03-17 | Discharge: 2020-04-17 | Disposition: A | Discharge status: Skilled Nursing Facility | Payer: MEDICARE

## 2020-03-17 ENCOUNTER — Encounter
Admit: 2020-03-17 | Discharge: 2020-04-17 | Disposition: A | Discharge status: Skilled Nursing Facility | Payer: MEDICARE | Attending: Certified Registered"

## 2020-03-17 DIAGNOSIS — E870 Hyperosmolality and hypernatremia: Principal | ICD-10-CM

## 2020-03-17 DIAGNOSIS — I4891 Unspecified atrial fibrillation: Principal | ICD-10-CM

## 2020-03-17 DIAGNOSIS — R6521 Severe sepsis with septic shock: Principal | ICD-10-CM

## 2020-03-17 DIAGNOSIS — I639 Cerebral infarction, unspecified: Principal | ICD-10-CM

## 2020-03-17 DIAGNOSIS — Z23 Encounter for immunization: Principal | ICD-10-CM

## 2020-03-17 DIAGNOSIS — N170 Acute kidney failure with tubular necrosis: Principal | ICD-10-CM

## 2020-03-17 DIAGNOSIS — U071 COVID-19: Principal | ICD-10-CM

## 2020-03-17 DIAGNOSIS — I5032 Chronic diastolic (congestive) heart failure: Principal | ICD-10-CM

## 2020-03-17 DIAGNOSIS — Z800 Family history of malignant neoplasm of digestive organs: Principal | ICD-10-CM

## 2020-03-17 DIAGNOSIS — Z87891 Personal history of nicotine dependence: Principal | ICD-10-CM

## 2020-03-17 DIAGNOSIS — Z7982 Long term (current) use of aspirin: Principal | ICD-10-CM

## 2020-03-17 DIAGNOSIS — Z6829 Body mass index (BMI) 29.0-29.9, adult: Principal | ICD-10-CM

## 2020-03-17 DIAGNOSIS — K3189 Other diseases of stomach and duodenum: Principal | ICD-10-CM

## 2020-03-17 DIAGNOSIS — T45525A Adverse effect of antithrombotic drugs, initial encounter: Principal | ICD-10-CM

## 2020-03-17 DIAGNOSIS — G8929 Other chronic pain: Principal | ICD-10-CM

## 2020-03-17 DIAGNOSIS — G9341 Metabolic encephalopathy: Principal | ICD-10-CM

## 2020-03-17 DIAGNOSIS — Z981 Arthrodesis status: Principal | ICD-10-CM

## 2020-03-17 DIAGNOSIS — K219 Gastro-esophageal reflux disease without esophagitis: Principal | ICD-10-CM

## 2020-03-17 DIAGNOSIS — E1122 Type 2 diabetes mellitus with diabetic chronic kidney disease: Principal | ICD-10-CM

## 2020-03-17 DIAGNOSIS — G928 Other toxic encephalopathy: Principal | ICD-10-CM

## 2020-03-17 DIAGNOSIS — Z7984 Long term (current) use of oral hypoglycemic drugs: Principal | ICD-10-CM

## 2020-03-17 DIAGNOSIS — I400 Infective myocarditis: Principal | ICD-10-CM

## 2020-03-17 DIAGNOSIS — A4189 Other specified sepsis: Principal | ICD-10-CM

## 2020-03-17 DIAGNOSIS — E1165 Type 2 diabetes mellitus with hyperglycemia: Principal | ICD-10-CM

## 2020-03-17 DIAGNOSIS — K922 Gastrointestinal hemorrhage, unspecified: Principal | ICD-10-CM

## 2020-03-17 DIAGNOSIS — Z791 Long term (current) use of non-steroidal anti-inflammatories (NSAID): Principal | ICD-10-CM

## 2020-03-17 DIAGNOSIS — Z79899 Other long term (current) drug therapy: Principal | ICD-10-CM

## 2020-03-17 DIAGNOSIS — Z951 Presence of aortocoronary bypass graft: Principal | ICD-10-CM

## 2020-03-17 DIAGNOSIS — I2693 Single subsegmental pulmonary embolism without acute cor pulmonale: Principal | ICD-10-CM

## 2020-03-17 DIAGNOSIS — E785 Hyperlipidemia, unspecified: Principal | ICD-10-CM

## 2020-03-17 DIAGNOSIS — F329 Major depressive disorder, single episode, unspecified: Principal | ICD-10-CM

## 2020-03-17 DIAGNOSIS — Z96651 Presence of right artificial knee joint: Principal | ICD-10-CM

## 2020-03-17 DIAGNOSIS — E876 Hypokalemia: Principal | ICD-10-CM

## 2020-03-17 DIAGNOSIS — J1282 Pneumonia due to coronavirus disease 2019: Principal | ICD-10-CM

## 2020-03-17 DIAGNOSIS — I214 Non-ST elevation (NSTEMI) myocardial infarction: Principal | ICD-10-CM

## 2020-03-17 DIAGNOSIS — E871 Hypo-osmolality and hyponatremia: Principal | ICD-10-CM

## 2020-03-17 DIAGNOSIS — K7200 Acute and subacute hepatic failure without coma: Principal | ICD-10-CM

## 2020-03-17 DIAGNOSIS — D631 Anemia in chronic kidney disease: Principal | ICD-10-CM

## 2020-03-17 DIAGNOSIS — E43 Unspecified severe protein-calorie malnutrition: Principal | ICD-10-CM

## 2020-03-17 DIAGNOSIS — R570 Cardiogenic shock: Principal | ICD-10-CM

## 2020-03-17 DIAGNOSIS — I130 Hypertensive heart and chronic kidney disease with heart failure and stage 1 through stage 4 chronic kidney disease, or unspecified chronic kidney disease: Principal | ICD-10-CM

## 2020-03-17 DIAGNOSIS — N1831 Chronic kidney disease, stage 3a: Principal | ICD-10-CM

## 2020-03-17 DIAGNOSIS — Z9049 Acquired absence of other specified parts of digestive tract: Principal | ICD-10-CM

## 2020-03-17 DIAGNOSIS — F0390 Unspecified dementia without behavioral disturbance: Principal | ICD-10-CM

## 2020-03-17 DIAGNOSIS — D6832 Hemorrhagic disorder due to extrinsic circulating anticoagulants: Principal | ICD-10-CM

## 2020-03-17 DIAGNOSIS — I2510 Atherosclerotic heart disease of native coronary artery without angina pectoris: Principal | ICD-10-CM

## 2020-03-17 DIAGNOSIS — J9601 Acute respiratory failure with hypoxia: Principal | ICD-10-CM

## 2020-03-17 DIAGNOSIS — D62 Acute posthemorrhagic anemia: Principal | ICD-10-CM

## 2020-03-17 DIAGNOSIS — M1990 Unspecified osteoarthritis, unspecified site: Principal | ICD-10-CM

## 2020-03-17 DIAGNOSIS — E872 Acidosis: Principal | ICD-10-CM

## 2020-03-17 DIAGNOSIS — E538 Deficiency of other specified B group vitamins: Principal | ICD-10-CM

## 2020-03-18 DIAGNOSIS — I2693 Single subsegmental pulmonary embolism without acute cor pulmonale: Principal | ICD-10-CM

## 2020-03-18 DIAGNOSIS — N1831 Chronic kidney disease, stage 3a: Principal | ICD-10-CM

## 2020-03-18 DIAGNOSIS — J9601 Acute respiratory failure with hypoxia: Principal | ICD-10-CM

## 2020-03-18 DIAGNOSIS — R570 Cardiogenic shock: Principal | ICD-10-CM

## 2020-03-18 DIAGNOSIS — Z6829 Body mass index (BMI) 29.0-29.9, adult: Principal | ICD-10-CM

## 2020-03-18 DIAGNOSIS — I214 Non-ST elevation (NSTEMI) myocardial infarction: Principal | ICD-10-CM

## 2020-03-18 DIAGNOSIS — I5032 Chronic diastolic (congestive) heart failure: Principal | ICD-10-CM

## 2020-03-18 DIAGNOSIS — R6521 Severe sepsis with septic shock: Principal | ICD-10-CM

## 2020-03-18 DIAGNOSIS — J1282 Pneumonia due to coronavirus disease 2019: Principal | ICD-10-CM

## 2020-03-18 DIAGNOSIS — E872 Acidosis: Principal | ICD-10-CM

## 2020-03-18 DIAGNOSIS — M1990 Unspecified osteoarthritis, unspecified site: Principal | ICD-10-CM

## 2020-03-18 DIAGNOSIS — Z87891 Personal history of nicotine dependence: Principal | ICD-10-CM

## 2020-03-18 DIAGNOSIS — K7200 Acute and subacute hepatic failure without coma: Principal | ICD-10-CM

## 2020-03-18 DIAGNOSIS — T45525A Adverse effect of antithrombotic drugs, initial encounter: Principal | ICD-10-CM

## 2020-03-18 DIAGNOSIS — F0390 Unspecified dementia without behavioral disturbance: Principal | ICD-10-CM

## 2020-03-18 DIAGNOSIS — I639 Cerebral infarction, unspecified: Principal | ICD-10-CM

## 2020-03-18 DIAGNOSIS — A4189 Other specified sepsis: Principal | ICD-10-CM

## 2020-03-18 DIAGNOSIS — I400 Infective myocarditis: Principal | ICD-10-CM

## 2020-03-18 DIAGNOSIS — I130 Hypertensive heart and chronic kidney disease with heart failure and stage 1 through stage 4 chronic kidney disease, or unspecified chronic kidney disease: Principal | ICD-10-CM

## 2020-03-18 DIAGNOSIS — E870 Hyperosmolality and hypernatremia: Principal | ICD-10-CM

## 2020-03-18 DIAGNOSIS — Z981 Arthrodesis status: Principal | ICD-10-CM

## 2020-03-18 DIAGNOSIS — G9341 Metabolic encephalopathy: Principal | ICD-10-CM

## 2020-03-18 DIAGNOSIS — Z7984 Long term (current) use of oral hypoglycemic drugs: Principal | ICD-10-CM

## 2020-03-18 DIAGNOSIS — E871 Hypo-osmolality and hyponatremia: Principal | ICD-10-CM

## 2020-03-18 DIAGNOSIS — E43 Unspecified severe protein-calorie malnutrition: Principal | ICD-10-CM

## 2020-03-18 DIAGNOSIS — G928 Other toxic encephalopathy: Principal | ICD-10-CM

## 2020-03-18 DIAGNOSIS — E1165 Type 2 diabetes mellitus with hyperglycemia: Principal | ICD-10-CM

## 2020-03-18 DIAGNOSIS — G8929 Other chronic pain: Principal | ICD-10-CM

## 2020-03-18 DIAGNOSIS — Z23 Encounter for immunization: Principal | ICD-10-CM

## 2020-03-18 DIAGNOSIS — K219 Gastro-esophageal reflux disease without esophagitis: Principal | ICD-10-CM

## 2020-03-18 DIAGNOSIS — U071 COVID-19: Principal | ICD-10-CM

## 2020-03-18 DIAGNOSIS — K3189 Other diseases of stomach and duodenum: Principal | ICD-10-CM

## 2020-03-18 DIAGNOSIS — Z9049 Acquired absence of other specified parts of digestive tract: Principal | ICD-10-CM

## 2020-03-18 DIAGNOSIS — K922 Gastrointestinal hemorrhage, unspecified: Principal | ICD-10-CM

## 2020-03-18 DIAGNOSIS — F329 Major depressive disorder, single episode, unspecified: Principal | ICD-10-CM

## 2020-03-18 DIAGNOSIS — D62 Acute posthemorrhagic anemia: Principal | ICD-10-CM

## 2020-03-18 DIAGNOSIS — Z951 Presence of aortocoronary bypass graft: Principal | ICD-10-CM

## 2020-03-18 DIAGNOSIS — I2510 Atherosclerotic heart disease of native coronary artery without angina pectoris: Principal | ICD-10-CM

## 2020-03-18 DIAGNOSIS — D631 Anemia in chronic kidney disease: Principal | ICD-10-CM

## 2020-03-18 DIAGNOSIS — E785 Hyperlipidemia, unspecified: Principal | ICD-10-CM

## 2020-03-18 DIAGNOSIS — Z79899 Other long term (current) drug therapy: Principal | ICD-10-CM

## 2020-03-18 DIAGNOSIS — E876 Hypokalemia: Principal | ICD-10-CM

## 2020-03-18 DIAGNOSIS — I4891 Unspecified atrial fibrillation: Principal | ICD-10-CM

## 2020-03-18 DIAGNOSIS — E1122 Type 2 diabetes mellitus with diabetic chronic kidney disease: Principal | ICD-10-CM

## 2020-03-18 DIAGNOSIS — Z7982 Long term (current) use of aspirin: Principal | ICD-10-CM

## 2020-03-18 DIAGNOSIS — Z96651 Presence of right artificial knee joint: Principal | ICD-10-CM

## 2020-03-18 DIAGNOSIS — N170 Acute kidney failure with tubular necrosis: Principal | ICD-10-CM

## 2020-03-18 DIAGNOSIS — Z791 Long term (current) use of non-steroidal anti-inflammatories (NSAID): Principal | ICD-10-CM

## 2020-03-18 DIAGNOSIS — Z800 Family history of malignant neoplasm of digestive organs: Principal | ICD-10-CM

## 2020-03-18 DIAGNOSIS — D6832 Hemorrhagic disorder due to extrinsic circulating anticoagulants: Principal | ICD-10-CM

## 2020-03-19 DIAGNOSIS — Z87891 Personal history of nicotine dependence: Principal | ICD-10-CM

## 2020-03-19 DIAGNOSIS — F0390 Unspecified dementia without behavioral disturbance: Principal | ICD-10-CM

## 2020-03-19 DIAGNOSIS — E872 Acidosis: Principal | ICD-10-CM

## 2020-03-19 DIAGNOSIS — K922 Gastrointestinal hemorrhage, unspecified: Principal | ICD-10-CM

## 2020-03-19 DIAGNOSIS — G9341 Metabolic encephalopathy: Principal | ICD-10-CM

## 2020-03-19 DIAGNOSIS — Z23 Encounter for immunization: Principal | ICD-10-CM

## 2020-03-19 DIAGNOSIS — E43 Unspecified severe protein-calorie malnutrition: Principal | ICD-10-CM

## 2020-03-19 DIAGNOSIS — F329 Major depressive disorder, single episode, unspecified: Principal | ICD-10-CM

## 2020-03-19 DIAGNOSIS — I4891 Unspecified atrial fibrillation: Principal | ICD-10-CM

## 2020-03-19 DIAGNOSIS — I2510 Atherosclerotic heart disease of native coronary artery without angina pectoris: Principal | ICD-10-CM

## 2020-03-19 DIAGNOSIS — G8929 Other chronic pain: Principal | ICD-10-CM

## 2020-03-19 DIAGNOSIS — E785 Hyperlipidemia, unspecified: Principal | ICD-10-CM

## 2020-03-19 DIAGNOSIS — Z9049 Acquired absence of other specified parts of digestive tract: Principal | ICD-10-CM

## 2020-03-19 DIAGNOSIS — I400 Infective myocarditis: Principal | ICD-10-CM

## 2020-03-19 DIAGNOSIS — K219 Gastro-esophageal reflux disease without esophagitis: Principal | ICD-10-CM

## 2020-03-19 DIAGNOSIS — T45525A Adverse effect of antithrombotic drugs, initial encounter: Principal | ICD-10-CM

## 2020-03-19 DIAGNOSIS — E871 Hypo-osmolality and hyponatremia: Principal | ICD-10-CM

## 2020-03-19 DIAGNOSIS — E876 Hypokalemia: Principal | ICD-10-CM

## 2020-03-19 DIAGNOSIS — K7200 Acute and subacute hepatic failure without coma: Principal | ICD-10-CM

## 2020-03-19 DIAGNOSIS — I2693 Single subsegmental pulmonary embolism without acute cor pulmonale: Principal | ICD-10-CM

## 2020-03-19 DIAGNOSIS — Z951 Presence of aortocoronary bypass graft: Principal | ICD-10-CM

## 2020-03-19 DIAGNOSIS — R570 Cardiogenic shock: Principal | ICD-10-CM

## 2020-03-19 DIAGNOSIS — Z981 Arthrodesis status: Principal | ICD-10-CM

## 2020-03-19 DIAGNOSIS — Z7984 Long term (current) use of oral hypoglycemic drugs: Principal | ICD-10-CM

## 2020-03-19 DIAGNOSIS — G928 Other toxic encephalopathy: Principal | ICD-10-CM

## 2020-03-19 DIAGNOSIS — D631 Anemia in chronic kidney disease: Principal | ICD-10-CM

## 2020-03-19 DIAGNOSIS — U071 COVID-19: Principal | ICD-10-CM

## 2020-03-19 DIAGNOSIS — K3189 Other diseases of stomach and duodenum: Principal | ICD-10-CM

## 2020-03-19 DIAGNOSIS — D6832 Hemorrhagic disorder due to extrinsic circulating anticoagulants: Principal | ICD-10-CM

## 2020-03-19 DIAGNOSIS — Z7982 Long term (current) use of aspirin: Principal | ICD-10-CM

## 2020-03-19 DIAGNOSIS — I639 Cerebral infarction, unspecified: Principal | ICD-10-CM

## 2020-03-19 DIAGNOSIS — I5032 Chronic diastolic (congestive) heart failure: Principal | ICD-10-CM

## 2020-03-19 DIAGNOSIS — Z6829 Body mass index (BMI) 29.0-29.9, adult: Principal | ICD-10-CM

## 2020-03-19 DIAGNOSIS — N1831 Chronic kidney disease, stage 3a: Principal | ICD-10-CM

## 2020-03-19 DIAGNOSIS — J1282 Pneumonia due to coronavirus disease 2019: Principal | ICD-10-CM

## 2020-03-19 DIAGNOSIS — Z791 Long term (current) use of non-steroidal anti-inflammatories (NSAID): Principal | ICD-10-CM

## 2020-03-19 DIAGNOSIS — E1122 Type 2 diabetes mellitus with diabetic chronic kidney disease: Principal | ICD-10-CM

## 2020-03-19 DIAGNOSIS — E1165 Type 2 diabetes mellitus with hyperglycemia: Principal | ICD-10-CM

## 2020-03-19 DIAGNOSIS — N170 Acute kidney failure with tubular necrosis: Principal | ICD-10-CM

## 2020-03-19 DIAGNOSIS — E870 Hyperosmolality and hypernatremia: Principal | ICD-10-CM

## 2020-03-19 DIAGNOSIS — Z96651 Presence of right artificial knee joint: Principal | ICD-10-CM

## 2020-03-19 DIAGNOSIS — M1990 Unspecified osteoarthritis, unspecified site: Principal | ICD-10-CM

## 2020-03-19 DIAGNOSIS — I130 Hypertensive heart and chronic kidney disease with heart failure and stage 1 through stage 4 chronic kidney disease, or unspecified chronic kidney disease: Principal | ICD-10-CM

## 2020-03-19 DIAGNOSIS — Z79899 Other long term (current) drug therapy: Principal | ICD-10-CM

## 2020-03-19 DIAGNOSIS — J9601 Acute respiratory failure with hypoxia: Principal | ICD-10-CM

## 2020-03-19 DIAGNOSIS — D62 Acute posthemorrhagic anemia: Principal | ICD-10-CM

## 2020-03-19 DIAGNOSIS — Z800 Family history of malignant neoplasm of digestive organs: Principal | ICD-10-CM

## 2020-03-19 DIAGNOSIS — A4189 Other specified sepsis: Principal | ICD-10-CM

## 2020-03-19 DIAGNOSIS — I214 Non-ST elevation (NSTEMI) myocardial infarction: Principal | ICD-10-CM

## 2020-03-19 DIAGNOSIS — R6521 Severe sepsis with septic shock: Principal | ICD-10-CM

## 2020-03-20 DIAGNOSIS — Z79899 Other long term (current) drug therapy: Principal | ICD-10-CM

## 2020-03-20 DIAGNOSIS — R570 Cardiogenic shock: Principal | ICD-10-CM

## 2020-03-20 DIAGNOSIS — K922 Gastrointestinal hemorrhage, unspecified: Principal | ICD-10-CM

## 2020-03-20 DIAGNOSIS — I400 Infective myocarditis: Principal | ICD-10-CM

## 2020-03-20 DIAGNOSIS — U071 COVID-19: Principal | ICD-10-CM

## 2020-03-20 DIAGNOSIS — Z87891 Personal history of nicotine dependence: Principal | ICD-10-CM

## 2020-03-20 DIAGNOSIS — I639 Cerebral infarction, unspecified: Principal | ICD-10-CM

## 2020-03-20 DIAGNOSIS — D62 Acute posthemorrhagic anemia: Principal | ICD-10-CM

## 2020-03-20 DIAGNOSIS — E876 Hypokalemia: Principal | ICD-10-CM

## 2020-03-20 DIAGNOSIS — G8929 Other chronic pain: Principal | ICD-10-CM

## 2020-03-20 DIAGNOSIS — I214 Non-ST elevation (NSTEMI) myocardial infarction: Principal | ICD-10-CM

## 2020-03-20 DIAGNOSIS — Z7982 Long term (current) use of aspirin: Principal | ICD-10-CM

## 2020-03-20 DIAGNOSIS — K7200 Acute and subacute hepatic failure without coma: Principal | ICD-10-CM

## 2020-03-20 DIAGNOSIS — N1831 Chronic kidney disease, stage 3a: Principal | ICD-10-CM

## 2020-03-20 DIAGNOSIS — Z800 Family history of malignant neoplasm of digestive organs: Principal | ICD-10-CM

## 2020-03-20 DIAGNOSIS — D631 Anemia in chronic kidney disease: Principal | ICD-10-CM

## 2020-03-20 DIAGNOSIS — I130 Hypertensive heart and chronic kidney disease with heart failure and stage 1 through stage 4 chronic kidney disease, or unspecified chronic kidney disease: Principal | ICD-10-CM

## 2020-03-20 DIAGNOSIS — A4189 Other specified sepsis: Principal | ICD-10-CM

## 2020-03-20 DIAGNOSIS — D6832 Hemorrhagic disorder due to extrinsic circulating anticoagulants: Principal | ICD-10-CM

## 2020-03-20 DIAGNOSIS — K219 Gastro-esophageal reflux disease without esophagitis: Principal | ICD-10-CM

## 2020-03-20 DIAGNOSIS — F329 Major depressive disorder, single episode, unspecified: Principal | ICD-10-CM

## 2020-03-20 DIAGNOSIS — E871 Hypo-osmolality and hyponatremia: Principal | ICD-10-CM

## 2020-03-20 DIAGNOSIS — G9341 Metabolic encephalopathy: Principal | ICD-10-CM

## 2020-03-20 DIAGNOSIS — E43 Unspecified severe protein-calorie malnutrition: Principal | ICD-10-CM

## 2020-03-20 DIAGNOSIS — F0390 Unspecified dementia without behavioral disturbance: Principal | ICD-10-CM

## 2020-03-20 DIAGNOSIS — Z7984 Long term (current) use of oral hypoglycemic drugs: Principal | ICD-10-CM

## 2020-03-20 DIAGNOSIS — K3189 Other diseases of stomach and duodenum: Principal | ICD-10-CM

## 2020-03-20 DIAGNOSIS — I2510 Atherosclerotic heart disease of native coronary artery without angina pectoris: Principal | ICD-10-CM

## 2020-03-20 DIAGNOSIS — G928 Other toxic encephalopathy: Principal | ICD-10-CM

## 2020-03-20 DIAGNOSIS — N170 Acute kidney failure with tubular necrosis: Principal | ICD-10-CM

## 2020-03-20 DIAGNOSIS — E1165 Type 2 diabetes mellitus with hyperglycemia: Principal | ICD-10-CM

## 2020-03-20 DIAGNOSIS — Z981 Arthrodesis status: Principal | ICD-10-CM

## 2020-03-20 DIAGNOSIS — M1990 Unspecified osteoarthritis, unspecified site: Principal | ICD-10-CM

## 2020-03-20 DIAGNOSIS — E785 Hyperlipidemia, unspecified: Principal | ICD-10-CM

## 2020-03-20 DIAGNOSIS — I2693 Single subsegmental pulmonary embolism without acute cor pulmonale: Principal | ICD-10-CM

## 2020-03-20 DIAGNOSIS — Z951 Presence of aortocoronary bypass graft: Principal | ICD-10-CM

## 2020-03-20 DIAGNOSIS — Z96651 Presence of right artificial knee joint: Principal | ICD-10-CM

## 2020-03-20 DIAGNOSIS — E1122 Type 2 diabetes mellitus with diabetic chronic kidney disease: Principal | ICD-10-CM

## 2020-03-20 DIAGNOSIS — T45525A Adverse effect of antithrombotic drugs, initial encounter: Principal | ICD-10-CM

## 2020-03-20 DIAGNOSIS — R6521 Severe sepsis with septic shock: Principal | ICD-10-CM

## 2020-03-20 DIAGNOSIS — Z6829 Body mass index (BMI) 29.0-29.9, adult: Principal | ICD-10-CM

## 2020-03-20 DIAGNOSIS — E872 Acidosis: Principal | ICD-10-CM

## 2020-03-20 DIAGNOSIS — Z23 Encounter for immunization: Principal | ICD-10-CM

## 2020-03-20 DIAGNOSIS — I4891 Unspecified atrial fibrillation: Principal | ICD-10-CM

## 2020-03-20 DIAGNOSIS — Z9049 Acquired absence of other specified parts of digestive tract: Principal | ICD-10-CM

## 2020-03-20 DIAGNOSIS — E870 Hyperosmolality and hypernatremia: Principal | ICD-10-CM

## 2020-03-20 DIAGNOSIS — I5032 Chronic diastolic (congestive) heart failure: Principal | ICD-10-CM

## 2020-03-20 DIAGNOSIS — Z791 Long term (current) use of non-steroidal anti-inflammatories (NSAID): Principal | ICD-10-CM

## 2020-03-20 DIAGNOSIS — J1282 Pneumonia due to coronavirus disease 2019: Principal | ICD-10-CM

## 2020-03-20 DIAGNOSIS — J9601 Acute respiratory failure with hypoxia: Principal | ICD-10-CM

## 2020-03-21 DIAGNOSIS — Z6829 Body mass index (BMI) 29.0-29.9, adult: Principal | ICD-10-CM

## 2020-03-21 DIAGNOSIS — I214 Non-ST elevation (NSTEMI) myocardial infarction: Principal | ICD-10-CM

## 2020-03-21 DIAGNOSIS — G928 Other toxic encephalopathy: Principal | ICD-10-CM

## 2020-03-21 DIAGNOSIS — U071 COVID-19: Principal | ICD-10-CM

## 2020-03-21 DIAGNOSIS — G8929 Other chronic pain: Principal | ICD-10-CM

## 2020-03-21 DIAGNOSIS — Z791 Long term (current) use of non-steroidal anti-inflammatories (NSAID): Principal | ICD-10-CM

## 2020-03-21 DIAGNOSIS — M1990 Unspecified osteoarthritis, unspecified site: Principal | ICD-10-CM

## 2020-03-21 DIAGNOSIS — D62 Acute posthemorrhagic anemia: Principal | ICD-10-CM

## 2020-03-21 DIAGNOSIS — N170 Acute kidney failure with tubular necrosis: Principal | ICD-10-CM

## 2020-03-21 DIAGNOSIS — Z23 Encounter for immunization: Principal | ICD-10-CM

## 2020-03-21 DIAGNOSIS — E785 Hyperlipidemia, unspecified: Principal | ICD-10-CM

## 2020-03-21 DIAGNOSIS — I400 Infective myocarditis: Principal | ICD-10-CM

## 2020-03-21 DIAGNOSIS — I4891 Unspecified atrial fibrillation: Principal | ICD-10-CM

## 2020-03-21 DIAGNOSIS — G9341 Metabolic encephalopathy: Principal | ICD-10-CM

## 2020-03-21 DIAGNOSIS — D631 Anemia in chronic kidney disease: Principal | ICD-10-CM

## 2020-03-21 DIAGNOSIS — I2510 Atherosclerotic heart disease of native coronary artery without angina pectoris: Principal | ICD-10-CM

## 2020-03-21 DIAGNOSIS — E872 Acidosis: Principal | ICD-10-CM

## 2020-03-21 DIAGNOSIS — Z951 Presence of aortocoronary bypass graft: Principal | ICD-10-CM

## 2020-03-21 DIAGNOSIS — Z9049 Acquired absence of other specified parts of digestive tract: Principal | ICD-10-CM

## 2020-03-21 DIAGNOSIS — D6832 Hemorrhagic disorder due to extrinsic circulating anticoagulants: Principal | ICD-10-CM

## 2020-03-21 DIAGNOSIS — F0390 Unspecified dementia without behavioral disturbance: Principal | ICD-10-CM

## 2020-03-21 DIAGNOSIS — Z79899 Other long term (current) drug therapy: Principal | ICD-10-CM

## 2020-03-21 DIAGNOSIS — K7200 Acute and subacute hepatic failure without coma: Principal | ICD-10-CM

## 2020-03-21 DIAGNOSIS — Z7984 Long term (current) use of oral hypoglycemic drugs: Principal | ICD-10-CM

## 2020-03-21 DIAGNOSIS — Z96651 Presence of right artificial knee joint: Principal | ICD-10-CM

## 2020-03-21 DIAGNOSIS — E871 Hypo-osmolality and hyponatremia: Principal | ICD-10-CM

## 2020-03-21 DIAGNOSIS — I639 Cerebral infarction, unspecified: Principal | ICD-10-CM

## 2020-03-21 DIAGNOSIS — K219 Gastro-esophageal reflux disease without esophagitis: Principal | ICD-10-CM

## 2020-03-21 DIAGNOSIS — E870 Hyperosmolality and hypernatremia: Principal | ICD-10-CM

## 2020-03-21 DIAGNOSIS — I2693 Single subsegmental pulmonary embolism without acute cor pulmonale: Principal | ICD-10-CM

## 2020-03-21 DIAGNOSIS — E1165 Type 2 diabetes mellitus with hyperglycemia: Principal | ICD-10-CM

## 2020-03-21 DIAGNOSIS — K3189 Other diseases of stomach and duodenum: Principal | ICD-10-CM

## 2020-03-21 DIAGNOSIS — I130 Hypertensive heart and chronic kidney disease with heart failure and stage 1 through stage 4 chronic kidney disease, or unspecified chronic kidney disease: Principal | ICD-10-CM

## 2020-03-21 DIAGNOSIS — E1122 Type 2 diabetes mellitus with diabetic chronic kidney disease: Principal | ICD-10-CM

## 2020-03-21 DIAGNOSIS — I5032 Chronic diastolic (congestive) heart failure: Principal | ICD-10-CM

## 2020-03-21 DIAGNOSIS — N1831 Chronic kidney disease, stage 3a: Principal | ICD-10-CM

## 2020-03-21 DIAGNOSIS — T45525A Adverse effect of antithrombotic drugs, initial encounter: Principal | ICD-10-CM

## 2020-03-21 DIAGNOSIS — Z7982 Long term (current) use of aspirin: Principal | ICD-10-CM

## 2020-03-21 DIAGNOSIS — A4189 Other specified sepsis: Principal | ICD-10-CM

## 2020-03-21 DIAGNOSIS — K922 Gastrointestinal hemorrhage, unspecified: Principal | ICD-10-CM

## 2020-03-21 DIAGNOSIS — J1282 Pneumonia due to coronavirus disease 2019: Principal | ICD-10-CM

## 2020-03-21 DIAGNOSIS — E43 Unspecified severe protein-calorie malnutrition: Principal | ICD-10-CM

## 2020-03-21 DIAGNOSIS — R6521 Severe sepsis with septic shock: Principal | ICD-10-CM

## 2020-03-21 DIAGNOSIS — F329 Major depressive disorder, single episode, unspecified: Principal | ICD-10-CM

## 2020-03-21 DIAGNOSIS — Z800 Family history of malignant neoplasm of digestive organs: Principal | ICD-10-CM

## 2020-03-21 DIAGNOSIS — Z981 Arthrodesis status: Principal | ICD-10-CM

## 2020-03-21 DIAGNOSIS — Z87891 Personal history of nicotine dependence: Principal | ICD-10-CM

## 2020-03-21 DIAGNOSIS — E876 Hypokalemia: Principal | ICD-10-CM

## 2020-03-21 DIAGNOSIS — J9601 Acute respiratory failure with hypoxia: Principal | ICD-10-CM

## 2020-03-21 DIAGNOSIS — R570 Cardiogenic shock: Principal | ICD-10-CM

## 2020-03-24 DIAGNOSIS — Z9049 Acquired absence of other specified parts of digestive tract: Principal | ICD-10-CM

## 2020-03-24 DIAGNOSIS — Z791 Long term (current) use of non-steroidal anti-inflammatories (NSAID): Principal | ICD-10-CM

## 2020-03-24 DIAGNOSIS — Z7982 Long term (current) use of aspirin: Principal | ICD-10-CM

## 2020-03-24 DIAGNOSIS — K922 Gastrointestinal hemorrhage, unspecified: Principal | ICD-10-CM

## 2020-03-24 DIAGNOSIS — Z981 Arthrodesis status: Principal | ICD-10-CM

## 2020-03-24 DIAGNOSIS — U071 COVID-19: Principal | ICD-10-CM

## 2020-03-24 DIAGNOSIS — K7200 Acute and subacute hepatic failure without coma: Principal | ICD-10-CM

## 2020-03-24 DIAGNOSIS — M1990 Unspecified osteoarthritis, unspecified site: Principal | ICD-10-CM

## 2020-03-24 DIAGNOSIS — I400 Infective myocarditis: Principal | ICD-10-CM

## 2020-03-24 DIAGNOSIS — I2693 Single subsegmental pulmonary embolism without acute cor pulmonale: Principal | ICD-10-CM

## 2020-03-24 DIAGNOSIS — J1282 Pneumonia due to coronavirus disease 2019: Principal | ICD-10-CM

## 2020-03-24 DIAGNOSIS — E785 Hyperlipidemia, unspecified: Principal | ICD-10-CM

## 2020-03-24 DIAGNOSIS — Z6829 Body mass index (BMI) 29.0-29.9, adult: Principal | ICD-10-CM

## 2020-03-24 DIAGNOSIS — K219 Gastro-esophageal reflux disease without esophagitis: Principal | ICD-10-CM

## 2020-03-24 DIAGNOSIS — Z951 Presence of aortocoronary bypass graft: Principal | ICD-10-CM

## 2020-03-24 DIAGNOSIS — I2510 Atherosclerotic heart disease of native coronary artery without angina pectoris: Principal | ICD-10-CM

## 2020-03-24 DIAGNOSIS — E870 Hyperosmolality and hypernatremia: Principal | ICD-10-CM

## 2020-03-24 DIAGNOSIS — I4891 Unspecified atrial fibrillation: Principal | ICD-10-CM

## 2020-03-24 DIAGNOSIS — F0390 Unspecified dementia without behavioral disturbance: Principal | ICD-10-CM

## 2020-03-24 DIAGNOSIS — E872 Acidosis: Principal | ICD-10-CM

## 2020-03-24 DIAGNOSIS — R6521 Severe sepsis with septic shock: Principal | ICD-10-CM

## 2020-03-24 DIAGNOSIS — Z23 Encounter for immunization: Principal | ICD-10-CM

## 2020-03-24 DIAGNOSIS — F329 Major depressive disorder, single episode, unspecified: Principal | ICD-10-CM

## 2020-03-24 DIAGNOSIS — Z87891 Personal history of nicotine dependence: Principal | ICD-10-CM

## 2020-03-24 DIAGNOSIS — E876 Hypokalemia: Principal | ICD-10-CM

## 2020-03-24 DIAGNOSIS — K3189 Other diseases of stomach and duodenum: Principal | ICD-10-CM

## 2020-03-24 DIAGNOSIS — I639 Cerebral infarction, unspecified: Principal | ICD-10-CM

## 2020-03-24 DIAGNOSIS — I214 Non-ST elevation (NSTEMI) myocardial infarction: Principal | ICD-10-CM

## 2020-03-24 DIAGNOSIS — D6832 Hemorrhagic disorder due to extrinsic circulating anticoagulants: Principal | ICD-10-CM

## 2020-03-24 DIAGNOSIS — J9601 Acute respiratory failure with hypoxia: Principal | ICD-10-CM

## 2020-03-24 DIAGNOSIS — N1831 Chronic kidney disease, stage 3a: Principal | ICD-10-CM

## 2020-03-24 DIAGNOSIS — E1165 Type 2 diabetes mellitus with hyperglycemia: Principal | ICD-10-CM

## 2020-03-24 DIAGNOSIS — G9341 Metabolic encephalopathy: Principal | ICD-10-CM

## 2020-03-24 DIAGNOSIS — G8929 Other chronic pain: Principal | ICD-10-CM

## 2020-03-24 DIAGNOSIS — A4189 Other specified sepsis: Principal | ICD-10-CM

## 2020-03-24 DIAGNOSIS — Z7984 Long term (current) use of oral hypoglycemic drugs: Principal | ICD-10-CM

## 2020-03-24 DIAGNOSIS — D631 Anemia in chronic kidney disease: Principal | ICD-10-CM

## 2020-03-24 DIAGNOSIS — G928 Other toxic encephalopathy: Principal | ICD-10-CM

## 2020-03-24 DIAGNOSIS — Z79899 Other long term (current) drug therapy: Principal | ICD-10-CM

## 2020-03-24 DIAGNOSIS — Z96651 Presence of right artificial knee joint: Principal | ICD-10-CM

## 2020-03-24 DIAGNOSIS — D62 Acute posthemorrhagic anemia: Principal | ICD-10-CM

## 2020-03-24 DIAGNOSIS — I130 Hypertensive heart and chronic kidney disease with heart failure and stage 1 through stage 4 chronic kidney disease, or unspecified chronic kidney disease: Principal | ICD-10-CM

## 2020-03-24 DIAGNOSIS — E43 Unspecified severe protein-calorie malnutrition: Principal | ICD-10-CM

## 2020-03-24 DIAGNOSIS — Z800 Family history of malignant neoplasm of digestive organs: Principal | ICD-10-CM

## 2020-03-24 DIAGNOSIS — R570 Cardiogenic shock: Principal | ICD-10-CM

## 2020-03-24 DIAGNOSIS — E1122 Type 2 diabetes mellitus with diabetic chronic kidney disease: Principal | ICD-10-CM

## 2020-03-24 DIAGNOSIS — I5032 Chronic diastolic (congestive) heart failure: Principal | ICD-10-CM

## 2020-03-24 DIAGNOSIS — E871 Hypo-osmolality and hyponatremia: Principal | ICD-10-CM

## 2020-03-24 DIAGNOSIS — N170 Acute kidney failure with tubular necrosis: Principal | ICD-10-CM

## 2020-03-24 DIAGNOSIS — T45525A Adverse effect of antithrombotic drugs, initial encounter: Principal | ICD-10-CM

## 2020-03-26 DIAGNOSIS — F0390 Unspecified dementia without behavioral disturbance: Principal | ICD-10-CM

## 2020-03-26 DIAGNOSIS — E1165 Type 2 diabetes mellitus with hyperglycemia: Principal | ICD-10-CM

## 2020-03-26 DIAGNOSIS — I4891 Unspecified atrial fibrillation: Principal | ICD-10-CM

## 2020-03-26 DIAGNOSIS — G9341 Metabolic encephalopathy: Principal | ICD-10-CM

## 2020-03-26 DIAGNOSIS — E870 Hyperosmolality and hypernatremia: Principal | ICD-10-CM

## 2020-03-26 DIAGNOSIS — M1990 Unspecified osteoarthritis, unspecified site: Principal | ICD-10-CM

## 2020-03-26 DIAGNOSIS — D62 Acute posthemorrhagic anemia: Principal | ICD-10-CM

## 2020-03-26 DIAGNOSIS — I639 Cerebral infarction, unspecified: Principal | ICD-10-CM

## 2020-03-26 DIAGNOSIS — I214 Non-ST elevation (NSTEMI) myocardial infarction: Principal | ICD-10-CM

## 2020-03-26 DIAGNOSIS — E43 Unspecified severe protein-calorie malnutrition: Principal | ICD-10-CM

## 2020-03-26 DIAGNOSIS — I130 Hypertensive heart and chronic kidney disease with heart failure and stage 1 through stage 4 chronic kidney disease, or unspecified chronic kidney disease: Principal | ICD-10-CM

## 2020-03-26 DIAGNOSIS — F329 Major depressive disorder, single episode, unspecified: Principal | ICD-10-CM

## 2020-03-26 DIAGNOSIS — E871 Hypo-osmolality and hyponatremia: Principal | ICD-10-CM

## 2020-03-26 DIAGNOSIS — T45525A Adverse effect of antithrombotic drugs, initial encounter: Principal | ICD-10-CM

## 2020-03-26 DIAGNOSIS — Z791 Long term (current) use of non-steroidal anti-inflammatories (NSAID): Principal | ICD-10-CM

## 2020-03-26 DIAGNOSIS — Z7982 Long term (current) use of aspirin: Principal | ICD-10-CM

## 2020-03-26 DIAGNOSIS — Z7984 Long term (current) use of oral hypoglycemic drugs: Principal | ICD-10-CM

## 2020-03-26 DIAGNOSIS — E876 Hypokalemia: Principal | ICD-10-CM

## 2020-03-26 DIAGNOSIS — I400 Infective myocarditis: Principal | ICD-10-CM

## 2020-03-26 DIAGNOSIS — Z800 Family history of malignant neoplasm of digestive organs: Principal | ICD-10-CM

## 2020-03-26 DIAGNOSIS — Z79899 Other long term (current) drug therapy: Principal | ICD-10-CM

## 2020-03-26 DIAGNOSIS — K922 Gastrointestinal hemorrhage, unspecified: Principal | ICD-10-CM

## 2020-03-26 DIAGNOSIS — I2510 Atherosclerotic heart disease of native coronary artery without angina pectoris: Principal | ICD-10-CM

## 2020-03-26 DIAGNOSIS — Z87891 Personal history of nicotine dependence: Principal | ICD-10-CM

## 2020-03-26 DIAGNOSIS — J9601 Acute respiratory failure with hypoxia: Principal | ICD-10-CM

## 2020-03-26 DIAGNOSIS — E785 Hyperlipidemia, unspecified: Principal | ICD-10-CM

## 2020-03-26 DIAGNOSIS — K7200 Acute and subacute hepatic failure without coma: Principal | ICD-10-CM

## 2020-03-26 DIAGNOSIS — K219 Gastro-esophageal reflux disease without esophagitis: Principal | ICD-10-CM

## 2020-03-26 DIAGNOSIS — A4189 Other specified sepsis: Principal | ICD-10-CM

## 2020-03-26 DIAGNOSIS — K3189 Other diseases of stomach and duodenum: Principal | ICD-10-CM

## 2020-03-26 DIAGNOSIS — R6521 Severe sepsis with septic shock: Principal | ICD-10-CM

## 2020-03-26 DIAGNOSIS — Z96651 Presence of right artificial knee joint: Principal | ICD-10-CM

## 2020-03-26 DIAGNOSIS — Z951 Presence of aortocoronary bypass graft: Principal | ICD-10-CM

## 2020-03-26 DIAGNOSIS — I5032 Chronic diastolic (congestive) heart failure: Principal | ICD-10-CM

## 2020-03-26 DIAGNOSIS — N1831 Chronic kidney disease, stage 3a: Principal | ICD-10-CM

## 2020-03-26 DIAGNOSIS — D631 Anemia in chronic kidney disease: Principal | ICD-10-CM

## 2020-03-26 DIAGNOSIS — E872 Acidosis: Principal | ICD-10-CM

## 2020-03-26 DIAGNOSIS — Z6829 Body mass index (BMI) 29.0-29.9, adult: Principal | ICD-10-CM

## 2020-03-26 DIAGNOSIS — E1122 Type 2 diabetes mellitus with diabetic chronic kidney disease: Principal | ICD-10-CM

## 2020-03-26 DIAGNOSIS — D6832 Hemorrhagic disorder due to extrinsic circulating anticoagulants: Principal | ICD-10-CM

## 2020-03-26 DIAGNOSIS — G928 Other toxic encephalopathy: Principal | ICD-10-CM

## 2020-03-26 DIAGNOSIS — R570 Cardiogenic shock: Principal | ICD-10-CM

## 2020-03-26 DIAGNOSIS — G8929 Other chronic pain: Principal | ICD-10-CM

## 2020-03-26 DIAGNOSIS — Z9049 Acquired absence of other specified parts of digestive tract: Principal | ICD-10-CM

## 2020-03-26 DIAGNOSIS — J1282 Pneumonia due to coronavirus disease 2019: Principal | ICD-10-CM

## 2020-03-26 DIAGNOSIS — U071 COVID-19: Principal | ICD-10-CM

## 2020-03-26 DIAGNOSIS — N170 Acute kidney failure with tubular necrosis: Principal | ICD-10-CM

## 2020-03-26 DIAGNOSIS — Z981 Arthrodesis status: Principal | ICD-10-CM

## 2020-03-26 DIAGNOSIS — Z23 Encounter for immunization: Principal | ICD-10-CM

## 2020-03-26 DIAGNOSIS — I2693 Single subsegmental pulmonary embolism without acute cor pulmonale: Principal | ICD-10-CM

## 2020-03-31 DIAGNOSIS — Z791 Long term (current) use of non-steroidal anti-inflammatories (NSAID): Principal | ICD-10-CM

## 2020-03-31 DIAGNOSIS — Z951 Presence of aortocoronary bypass graft: Principal | ICD-10-CM

## 2020-03-31 DIAGNOSIS — F329 Major depressive disorder, single episode, unspecified: Principal | ICD-10-CM

## 2020-03-31 DIAGNOSIS — Z7982 Long term (current) use of aspirin: Principal | ICD-10-CM

## 2020-03-31 DIAGNOSIS — M1990 Unspecified osteoarthritis, unspecified site: Principal | ICD-10-CM

## 2020-03-31 DIAGNOSIS — G928 Other toxic encephalopathy: Principal | ICD-10-CM

## 2020-03-31 DIAGNOSIS — K219 Gastro-esophageal reflux disease without esophagitis: Principal | ICD-10-CM

## 2020-03-31 DIAGNOSIS — E785 Hyperlipidemia, unspecified: Principal | ICD-10-CM

## 2020-03-31 DIAGNOSIS — G8929 Other chronic pain: Principal | ICD-10-CM

## 2020-03-31 DIAGNOSIS — I2693 Single subsegmental pulmonary embolism without acute cor pulmonale: Principal | ICD-10-CM

## 2020-03-31 DIAGNOSIS — E876 Hypokalemia: Principal | ICD-10-CM

## 2020-03-31 DIAGNOSIS — R570 Cardiogenic shock: Principal | ICD-10-CM

## 2020-03-31 DIAGNOSIS — K7200 Acute and subacute hepatic failure without coma: Principal | ICD-10-CM

## 2020-03-31 DIAGNOSIS — I214 Non-ST elevation (NSTEMI) myocardial infarction: Principal | ICD-10-CM

## 2020-03-31 DIAGNOSIS — N170 Acute kidney failure with tubular necrosis: Principal | ICD-10-CM

## 2020-03-31 DIAGNOSIS — E1122 Type 2 diabetes mellitus with diabetic chronic kidney disease: Principal | ICD-10-CM

## 2020-03-31 DIAGNOSIS — D62 Acute posthemorrhagic anemia: Principal | ICD-10-CM

## 2020-03-31 DIAGNOSIS — T45525A Adverse effect of antithrombotic drugs, initial encounter: Principal | ICD-10-CM

## 2020-03-31 DIAGNOSIS — Z6829 Body mass index (BMI) 29.0-29.9, adult: Principal | ICD-10-CM

## 2020-03-31 DIAGNOSIS — I400 Infective myocarditis: Principal | ICD-10-CM

## 2020-03-31 DIAGNOSIS — G9341 Metabolic encephalopathy: Principal | ICD-10-CM

## 2020-03-31 DIAGNOSIS — Z800 Family history of malignant neoplasm of digestive organs: Principal | ICD-10-CM

## 2020-03-31 DIAGNOSIS — A4189 Other specified sepsis: Principal | ICD-10-CM

## 2020-03-31 DIAGNOSIS — J1282 Pneumonia due to coronavirus disease 2019: Principal | ICD-10-CM

## 2020-03-31 DIAGNOSIS — I130 Hypertensive heart and chronic kidney disease with heart failure and stage 1 through stage 4 chronic kidney disease, or unspecified chronic kidney disease: Principal | ICD-10-CM

## 2020-03-31 DIAGNOSIS — N1831 Chronic kidney disease, stage 3a: Principal | ICD-10-CM

## 2020-03-31 DIAGNOSIS — Z96651 Presence of right artificial knee joint: Principal | ICD-10-CM

## 2020-03-31 DIAGNOSIS — Z7984 Long term (current) use of oral hypoglycemic drugs: Principal | ICD-10-CM

## 2020-03-31 DIAGNOSIS — D6832 Hemorrhagic disorder due to extrinsic circulating anticoagulants: Principal | ICD-10-CM

## 2020-03-31 DIAGNOSIS — Z87891 Personal history of nicotine dependence: Principal | ICD-10-CM

## 2020-03-31 DIAGNOSIS — E871 Hypo-osmolality and hyponatremia: Principal | ICD-10-CM

## 2020-03-31 DIAGNOSIS — E872 Acidosis: Principal | ICD-10-CM

## 2020-03-31 DIAGNOSIS — Z79899 Other long term (current) drug therapy: Principal | ICD-10-CM

## 2020-03-31 DIAGNOSIS — F0390 Unspecified dementia without behavioral disturbance: Principal | ICD-10-CM

## 2020-03-31 DIAGNOSIS — I639 Cerebral infarction, unspecified: Principal | ICD-10-CM

## 2020-03-31 DIAGNOSIS — J9601 Acute respiratory failure with hypoxia: Principal | ICD-10-CM

## 2020-03-31 DIAGNOSIS — R6521 Severe sepsis with septic shock: Principal | ICD-10-CM

## 2020-03-31 DIAGNOSIS — E43 Unspecified severe protein-calorie malnutrition: Principal | ICD-10-CM

## 2020-03-31 DIAGNOSIS — E870 Hyperosmolality and hypernatremia: Principal | ICD-10-CM

## 2020-03-31 DIAGNOSIS — Z981 Arthrodesis status: Principal | ICD-10-CM

## 2020-03-31 DIAGNOSIS — K3189 Other diseases of stomach and duodenum: Principal | ICD-10-CM

## 2020-03-31 DIAGNOSIS — I2510 Atherosclerotic heart disease of native coronary artery without angina pectoris: Principal | ICD-10-CM

## 2020-03-31 DIAGNOSIS — Z23 Encounter for immunization: Principal | ICD-10-CM

## 2020-03-31 DIAGNOSIS — Z9049 Acquired absence of other specified parts of digestive tract: Principal | ICD-10-CM

## 2020-03-31 DIAGNOSIS — U071 COVID-19: Principal | ICD-10-CM

## 2020-03-31 DIAGNOSIS — E1165 Type 2 diabetes mellitus with hyperglycemia: Principal | ICD-10-CM

## 2020-03-31 DIAGNOSIS — K922 Gastrointestinal hemorrhage, unspecified: Principal | ICD-10-CM

## 2020-03-31 DIAGNOSIS — D631 Anemia in chronic kidney disease: Principal | ICD-10-CM

## 2020-03-31 DIAGNOSIS — I5032 Chronic diastolic (congestive) heart failure: Principal | ICD-10-CM

## 2020-03-31 DIAGNOSIS — I4891 Unspecified atrial fibrillation: Principal | ICD-10-CM

## 2020-04-01 DIAGNOSIS — I214 Non-ST elevation (NSTEMI) myocardial infarction: Principal | ICD-10-CM

## 2020-04-01 DIAGNOSIS — E870 Hyperosmolality and hypernatremia: Principal | ICD-10-CM

## 2020-04-01 DIAGNOSIS — Z6829 Body mass index (BMI) 29.0-29.9, adult: Principal | ICD-10-CM

## 2020-04-01 DIAGNOSIS — Z87891 Personal history of nicotine dependence: Principal | ICD-10-CM

## 2020-04-01 DIAGNOSIS — E43 Unspecified severe protein-calorie malnutrition: Principal | ICD-10-CM

## 2020-04-01 DIAGNOSIS — G928 Other toxic encephalopathy: Principal | ICD-10-CM

## 2020-04-01 DIAGNOSIS — N170 Acute kidney failure with tubular necrosis: Principal | ICD-10-CM

## 2020-04-01 DIAGNOSIS — I130 Hypertensive heart and chronic kidney disease with heart failure and stage 1 through stage 4 chronic kidney disease, or unspecified chronic kidney disease: Principal | ICD-10-CM

## 2020-04-01 DIAGNOSIS — A4189 Other specified sepsis: Principal | ICD-10-CM

## 2020-04-01 DIAGNOSIS — R570 Cardiogenic shock: Principal | ICD-10-CM

## 2020-04-01 DIAGNOSIS — Z96651 Presence of right artificial knee joint: Principal | ICD-10-CM

## 2020-04-01 DIAGNOSIS — I400 Infective myocarditis: Principal | ICD-10-CM

## 2020-04-01 DIAGNOSIS — I4891 Unspecified atrial fibrillation: Principal | ICD-10-CM

## 2020-04-01 DIAGNOSIS — Z9049 Acquired absence of other specified parts of digestive tract: Principal | ICD-10-CM

## 2020-04-01 DIAGNOSIS — N1831 Chronic kidney disease, stage 3a: Principal | ICD-10-CM

## 2020-04-01 DIAGNOSIS — Z7982 Long term (current) use of aspirin: Principal | ICD-10-CM

## 2020-04-01 DIAGNOSIS — J9601 Acute respiratory failure with hypoxia: Principal | ICD-10-CM

## 2020-04-01 DIAGNOSIS — E872 Acidosis: Principal | ICD-10-CM

## 2020-04-01 DIAGNOSIS — E871 Hypo-osmolality and hyponatremia: Principal | ICD-10-CM

## 2020-04-01 DIAGNOSIS — G8929 Other chronic pain: Principal | ICD-10-CM

## 2020-04-01 DIAGNOSIS — F0390 Unspecified dementia without behavioral disturbance: Principal | ICD-10-CM

## 2020-04-01 DIAGNOSIS — Z23 Encounter for immunization: Principal | ICD-10-CM

## 2020-04-01 DIAGNOSIS — I639 Cerebral infarction, unspecified: Principal | ICD-10-CM

## 2020-04-01 DIAGNOSIS — Z79899 Other long term (current) drug therapy: Principal | ICD-10-CM

## 2020-04-01 DIAGNOSIS — I5032 Chronic diastolic (congestive) heart failure: Principal | ICD-10-CM

## 2020-04-01 DIAGNOSIS — T45525A Adverse effect of antithrombotic drugs, initial encounter: Principal | ICD-10-CM

## 2020-04-01 DIAGNOSIS — I2693 Single subsegmental pulmonary embolism without acute cor pulmonale: Principal | ICD-10-CM

## 2020-04-01 DIAGNOSIS — E785 Hyperlipidemia, unspecified: Principal | ICD-10-CM

## 2020-04-01 DIAGNOSIS — Z800 Family history of malignant neoplasm of digestive organs: Principal | ICD-10-CM

## 2020-04-01 DIAGNOSIS — U071 COVID-19: Principal | ICD-10-CM

## 2020-04-01 DIAGNOSIS — Z7984 Long term (current) use of oral hypoglycemic drugs: Principal | ICD-10-CM

## 2020-04-01 DIAGNOSIS — F329 Major depressive disorder, single episode, unspecified: Principal | ICD-10-CM

## 2020-04-01 DIAGNOSIS — I2510 Atherosclerotic heart disease of native coronary artery without angina pectoris: Principal | ICD-10-CM

## 2020-04-01 DIAGNOSIS — E1165 Type 2 diabetes mellitus with hyperglycemia: Principal | ICD-10-CM

## 2020-04-01 DIAGNOSIS — K7200 Acute and subacute hepatic failure without coma: Principal | ICD-10-CM

## 2020-04-01 DIAGNOSIS — Z951 Presence of aortocoronary bypass graft: Principal | ICD-10-CM

## 2020-04-01 DIAGNOSIS — D62 Acute posthemorrhagic anemia: Principal | ICD-10-CM

## 2020-04-01 DIAGNOSIS — G9341 Metabolic encephalopathy: Principal | ICD-10-CM

## 2020-04-01 DIAGNOSIS — J1282 Pneumonia due to coronavirus disease 2019: Principal | ICD-10-CM

## 2020-04-01 DIAGNOSIS — K219 Gastro-esophageal reflux disease without esophagitis: Principal | ICD-10-CM

## 2020-04-01 DIAGNOSIS — Z791 Long term (current) use of non-steroidal anti-inflammatories (NSAID): Principal | ICD-10-CM

## 2020-04-01 DIAGNOSIS — R6521 Severe sepsis with septic shock: Principal | ICD-10-CM

## 2020-04-01 DIAGNOSIS — M1990 Unspecified osteoarthritis, unspecified site: Principal | ICD-10-CM

## 2020-04-01 DIAGNOSIS — Z981 Arthrodesis status: Principal | ICD-10-CM

## 2020-04-01 DIAGNOSIS — D6832 Hemorrhagic disorder due to extrinsic circulating anticoagulants: Principal | ICD-10-CM

## 2020-04-01 DIAGNOSIS — K3189 Other diseases of stomach and duodenum: Principal | ICD-10-CM

## 2020-04-01 DIAGNOSIS — E876 Hypokalemia: Principal | ICD-10-CM

## 2020-04-01 DIAGNOSIS — E1122 Type 2 diabetes mellitus with diabetic chronic kidney disease: Principal | ICD-10-CM

## 2020-04-01 DIAGNOSIS — D631 Anemia in chronic kidney disease: Principal | ICD-10-CM

## 2020-04-01 DIAGNOSIS — K922 Gastrointestinal hemorrhage, unspecified: Principal | ICD-10-CM

## 2020-04-02 DIAGNOSIS — Z6829 Body mass index (BMI) 29.0-29.9, adult: Principal | ICD-10-CM

## 2020-04-02 DIAGNOSIS — G928 Other toxic encephalopathy: Principal | ICD-10-CM

## 2020-04-02 DIAGNOSIS — N170 Acute kidney failure with tubular necrosis: Principal | ICD-10-CM

## 2020-04-02 DIAGNOSIS — F0390 Unspecified dementia without behavioral disturbance: Principal | ICD-10-CM

## 2020-04-02 DIAGNOSIS — Z981 Arthrodesis status: Principal | ICD-10-CM

## 2020-04-02 DIAGNOSIS — I130 Hypertensive heart and chronic kidney disease with heart failure and stage 1 through stage 4 chronic kidney disease, or unspecified chronic kidney disease: Principal | ICD-10-CM

## 2020-04-02 DIAGNOSIS — Z23 Encounter for immunization: Principal | ICD-10-CM

## 2020-04-02 DIAGNOSIS — I2693 Single subsegmental pulmonary embolism without acute cor pulmonale: Principal | ICD-10-CM

## 2020-04-02 DIAGNOSIS — Z79899 Other long term (current) drug therapy: Principal | ICD-10-CM

## 2020-04-02 DIAGNOSIS — Z791 Long term (current) use of non-steroidal anti-inflammatories (NSAID): Principal | ICD-10-CM

## 2020-04-02 DIAGNOSIS — N1831 Chronic kidney disease, stage 3a: Principal | ICD-10-CM

## 2020-04-02 DIAGNOSIS — Z7982 Long term (current) use of aspirin: Principal | ICD-10-CM

## 2020-04-02 DIAGNOSIS — J1282 Pneumonia due to coronavirus disease 2019: Principal | ICD-10-CM

## 2020-04-02 DIAGNOSIS — E870 Hyperosmolality and hypernatremia: Principal | ICD-10-CM

## 2020-04-02 DIAGNOSIS — Z9049 Acquired absence of other specified parts of digestive tract: Principal | ICD-10-CM

## 2020-04-02 DIAGNOSIS — Z96651 Presence of right artificial knee joint: Principal | ICD-10-CM

## 2020-04-02 DIAGNOSIS — E1165 Type 2 diabetes mellitus with hyperglycemia: Principal | ICD-10-CM

## 2020-04-02 DIAGNOSIS — G9341 Metabolic encephalopathy: Principal | ICD-10-CM

## 2020-04-02 DIAGNOSIS — I2510 Atherosclerotic heart disease of native coronary artery without angina pectoris: Principal | ICD-10-CM

## 2020-04-02 DIAGNOSIS — T45525A Adverse effect of antithrombotic drugs, initial encounter: Principal | ICD-10-CM

## 2020-04-02 DIAGNOSIS — Z951 Presence of aortocoronary bypass graft: Principal | ICD-10-CM

## 2020-04-02 DIAGNOSIS — I214 Non-ST elevation (NSTEMI) myocardial infarction: Principal | ICD-10-CM

## 2020-04-02 DIAGNOSIS — I639 Cerebral infarction, unspecified: Principal | ICD-10-CM

## 2020-04-02 DIAGNOSIS — K922 Gastrointestinal hemorrhage, unspecified: Principal | ICD-10-CM

## 2020-04-02 DIAGNOSIS — E43 Unspecified severe protein-calorie malnutrition: Principal | ICD-10-CM

## 2020-04-02 DIAGNOSIS — E785 Hyperlipidemia, unspecified: Principal | ICD-10-CM

## 2020-04-02 DIAGNOSIS — I4891 Unspecified atrial fibrillation: Principal | ICD-10-CM

## 2020-04-02 DIAGNOSIS — U071 COVID-19: Principal | ICD-10-CM

## 2020-04-02 DIAGNOSIS — D62 Acute posthemorrhagic anemia: Principal | ICD-10-CM

## 2020-04-02 DIAGNOSIS — Z800 Family history of malignant neoplasm of digestive organs: Principal | ICD-10-CM

## 2020-04-02 DIAGNOSIS — R6521 Severe sepsis with septic shock: Principal | ICD-10-CM

## 2020-04-02 DIAGNOSIS — D6832 Hemorrhagic disorder due to extrinsic circulating anticoagulants: Principal | ICD-10-CM

## 2020-04-02 DIAGNOSIS — K3189 Other diseases of stomach and duodenum: Principal | ICD-10-CM

## 2020-04-02 DIAGNOSIS — Z87891 Personal history of nicotine dependence: Principal | ICD-10-CM

## 2020-04-02 DIAGNOSIS — G8929 Other chronic pain: Principal | ICD-10-CM

## 2020-04-02 DIAGNOSIS — D631 Anemia in chronic kidney disease: Principal | ICD-10-CM

## 2020-04-02 DIAGNOSIS — E1122 Type 2 diabetes mellitus with diabetic chronic kidney disease: Principal | ICD-10-CM

## 2020-04-02 DIAGNOSIS — M1990 Unspecified osteoarthritis, unspecified site: Principal | ICD-10-CM

## 2020-04-02 DIAGNOSIS — J9601 Acute respiratory failure with hypoxia: Principal | ICD-10-CM

## 2020-04-02 DIAGNOSIS — K219 Gastro-esophageal reflux disease without esophagitis: Principal | ICD-10-CM

## 2020-04-02 DIAGNOSIS — I5032 Chronic diastolic (congestive) heart failure: Principal | ICD-10-CM

## 2020-04-02 DIAGNOSIS — E871 Hypo-osmolality and hyponatremia: Principal | ICD-10-CM

## 2020-04-02 DIAGNOSIS — Z7984 Long term (current) use of oral hypoglycemic drugs: Principal | ICD-10-CM

## 2020-04-02 DIAGNOSIS — I400 Infective myocarditis: Principal | ICD-10-CM

## 2020-04-02 DIAGNOSIS — R570 Cardiogenic shock: Principal | ICD-10-CM

## 2020-04-02 DIAGNOSIS — K7200 Acute and subacute hepatic failure without coma: Principal | ICD-10-CM

## 2020-04-02 DIAGNOSIS — E872 Acidosis: Principal | ICD-10-CM

## 2020-04-02 DIAGNOSIS — A4189 Other specified sepsis: Principal | ICD-10-CM

## 2020-04-02 DIAGNOSIS — F329 Major depressive disorder, single episode, unspecified: Principal | ICD-10-CM

## 2020-04-02 DIAGNOSIS — E876 Hypokalemia: Principal | ICD-10-CM

## 2020-04-03 DIAGNOSIS — K7200 Acute and subacute hepatic failure without coma: Principal | ICD-10-CM

## 2020-04-03 DIAGNOSIS — Z981 Arthrodesis status: Principal | ICD-10-CM

## 2020-04-03 DIAGNOSIS — K3189 Other diseases of stomach and duodenum: Principal | ICD-10-CM

## 2020-04-03 DIAGNOSIS — J1282 Pneumonia due to coronavirus disease 2019: Principal | ICD-10-CM

## 2020-04-03 DIAGNOSIS — E1165 Type 2 diabetes mellitus with hyperglycemia: Principal | ICD-10-CM

## 2020-04-03 DIAGNOSIS — J9601 Acute respiratory failure with hypoxia: Principal | ICD-10-CM

## 2020-04-03 DIAGNOSIS — Z79899 Other long term (current) drug therapy: Principal | ICD-10-CM

## 2020-04-03 DIAGNOSIS — E1122 Type 2 diabetes mellitus with diabetic chronic kidney disease: Principal | ICD-10-CM

## 2020-04-03 DIAGNOSIS — Z7982 Long term (current) use of aspirin: Principal | ICD-10-CM

## 2020-04-03 DIAGNOSIS — I639 Cerebral infarction, unspecified: Principal | ICD-10-CM

## 2020-04-03 DIAGNOSIS — U071 COVID-19: Principal | ICD-10-CM

## 2020-04-03 DIAGNOSIS — Z23 Encounter for immunization: Principal | ICD-10-CM

## 2020-04-03 DIAGNOSIS — I400 Infective myocarditis: Principal | ICD-10-CM

## 2020-04-03 DIAGNOSIS — E43 Unspecified severe protein-calorie malnutrition: Principal | ICD-10-CM

## 2020-04-03 DIAGNOSIS — I5032 Chronic diastolic (congestive) heart failure: Principal | ICD-10-CM

## 2020-04-03 DIAGNOSIS — N170 Acute kidney failure with tubular necrosis: Principal | ICD-10-CM

## 2020-04-03 DIAGNOSIS — Z951 Presence of aortocoronary bypass graft: Principal | ICD-10-CM

## 2020-04-03 DIAGNOSIS — F329 Major depressive disorder, single episode, unspecified: Principal | ICD-10-CM

## 2020-04-03 DIAGNOSIS — I2510 Atherosclerotic heart disease of native coronary artery without angina pectoris: Principal | ICD-10-CM

## 2020-04-03 DIAGNOSIS — I130 Hypertensive heart and chronic kidney disease with heart failure and stage 1 through stage 4 chronic kidney disease, or unspecified chronic kidney disease: Principal | ICD-10-CM

## 2020-04-03 DIAGNOSIS — Z7984 Long term (current) use of oral hypoglycemic drugs: Principal | ICD-10-CM

## 2020-04-03 DIAGNOSIS — G8929 Other chronic pain: Principal | ICD-10-CM

## 2020-04-03 DIAGNOSIS — F0390 Unspecified dementia without behavioral disturbance: Principal | ICD-10-CM

## 2020-04-03 DIAGNOSIS — N1831 Chronic kidney disease, stage 3a: Principal | ICD-10-CM

## 2020-04-03 DIAGNOSIS — E870 Hyperosmolality and hypernatremia: Principal | ICD-10-CM

## 2020-04-03 DIAGNOSIS — G928 Other toxic encephalopathy: Principal | ICD-10-CM

## 2020-04-03 DIAGNOSIS — I214 Non-ST elevation (NSTEMI) myocardial infarction: Principal | ICD-10-CM

## 2020-04-03 DIAGNOSIS — Z800 Family history of malignant neoplasm of digestive organs: Principal | ICD-10-CM

## 2020-04-03 DIAGNOSIS — D631 Anemia in chronic kidney disease: Principal | ICD-10-CM

## 2020-04-03 DIAGNOSIS — R6521 Severe sepsis with septic shock: Principal | ICD-10-CM

## 2020-04-03 DIAGNOSIS — R570 Cardiogenic shock: Principal | ICD-10-CM

## 2020-04-03 DIAGNOSIS — Z6829 Body mass index (BMI) 29.0-29.9, adult: Principal | ICD-10-CM

## 2020-04-03 DIAGNOSIS — A4189 Other specified sepsis: Principal | ICD-10-CM

## 2020-04-03 DIAGNOSIS — M1990 Unspecified osteoarthritis, unspecified site: Principal | ICD-10-CM

## 2020-04-03 DIAGNOSIS — Z87891 Personal history of nicotine dependence: Principal | ICD-10-CM

## 2020-04-03 DIAGNOSIS — T45525A Adverse effect of antithrombotic drugs, initial encounter: Principal | ICD-10-CM

## 2020-04-03 DIAGNOSIS — Z96651 Presence of right artificial knee joint: Principal | ICD-10-CM

## 2020-04-03 DIAGNOSIS — I2693 Single subsegmental pulmonary embolism without acute cor pulmonale: Principal | ICD-10-CM

## 2020-04-03 DIAGNOSIS — D62 Acute posthemorrhagic anemia: Principal | ICD-10-CM

## 2020-04-03 DIAGNOSIS — D6832 Hemorrhagic disorder due to extrinsic circulating anticoagulants: Principal | ICD-10-CM

## 2020-04-03 DIAGNOSIS — E785 Hyperlipidemia, unspecified: Principal | ICD-10-CM

## 2020-04-03 DIAGNOSIS — E871 Hypo-osmolality and hyponatremia: Principal | ICD-10-CM

## 2020-04-03 DIAGNOSIS — K922 Gastrointestinal hemorrhage, unspecified: Principal | ICD-10-CM

## 2020-04-03 DIAGNOSIS — Z791 Long term (current) use of non-steroidal anti-inflammatories (NSAID): Principal | ICD-10-CM

## 2020-04-03 DIAGNOSIS — K219 Gastro-esophageal reflux disease without esophagitis: Principal | ICD-10-CM

## 2020-04-03 DIAGNOSIS — I4891 Unspecified atrial fibrillation: Principal | ICD-10-CM

## 2020-04-03 DIAGNOSIS — E876 Hypokalemia: Principal | ICD-10-CM

## 2020-04-03 DIAGNOSIS — G9341 Metabolic encephalopathy: Principal | ICD-10-CM

## 2020-04-03 DIAGNOSIS — Z9049 Acquired absence of other specified parts of digestive tract: Principal | ICD-10-CM

## 2020-04-03 DIAGNOSIS — E872 Acidosis: Principal | ICD-10-CM

## 2020-04-05 DIAGNOSIS — I2510 Atherosclerotic heart disease of native coronary artery without angina pectoris: Principal | ICD-10-CM

## 2020-04-05 DIAGNOSIS — Z23 Encounter for immunization: Principal | ICD-10-CM

## 2020-04-05 DIAGNOSIS — E876 Hypokalemia: Principal | ICD-10-CM

## 2020-04-05 DIAGNOSIS — F0390 Unspecified dementia without behavioral disturbance: Principal | ICD-10-CM

## 2020-04-05 DIAGNOSIS — K922 Gastrointestinal hemorrhage, unspecified: Principal | ICD-10-CM

## 2020-04-05 DIAGNOSIS — Z9049 Acquired absence of other specified parts of digestive tract: Principal | ICD-10-CM

## 2020-04-05 DIAGNOSIS — E871 Hypo-osmolality and hyponatremia: Principal | ICD-10-CM

## 2020-04-05 DIAGNOSIS — I4891 Unspecified atrial fibrillation: Principal | ICD-10-CM

## 2020-04-05 DIAGNOSIS — T45525A Adverse effect of antithrombotic drugs, initial encounter: Principal | ICD-10-CM

## 2020-04-05 DIAGNOSIS — Z7982 Long term (current) use of aspirin: Principal | ICD-10-CM

## 2020-04-05 DIAGNOSIS — N170 Acute kidney failure with tubular necrosis: Principal | ICD-10-CM

## 2020-04-05 DIAGNOSIS — I639 Cerebral infarction, unspecified: Principal | ICD-10-CM

## 2020-04-05 DIAGNOSIS — Z7984 Long term (current) use of oral hypoglycemic drugs: Principal | ICD-10-CM

## 2020-04-05 DIAGNOSIS — E870 Hyperosmolality and hypernatremia: Principal | ICD-10-CM

## 2020-04-05 DIAGNOSIS — E872 Acidosis: Principal | ICD-10-CM

## 2020-04-05 DIAGNOSIS — G928 Other toxic encephalopathy: Principal | ICD-10-CM

## 2020-04-05 DIAGNOSIS — J1282 Pneumonia due to coronavirus disease 2019: Principal | ICD-10-CM

## 2020-04-05 DIAGNOSIS — E1122 Type 2 diabetes mellitus with diabetic chronic kidney disease: Principal | ICD-10-CM

## 2020-04-05 DIAGNOSIS — I400 Infective myocarditis: Principal | ICD-10-CM

## 2020-04-05 DIAGNOSIS — I130 Hypertensive heart and chronic kidney disease with heart failure and stage 1 through stage 4 chronic kidney disease, or unspecified chronic kidney disease: Principal | ICD-10-CM

## 2020-04-05 DIAGNOSIS — K3189 Other diseases of stomach and duodenum: Principal | ICD-10-CM

## 2020-04-05 DIAGNOSIS — N1831 Chronic kidney disease, stage 3a: Principal | ICD-10-CM

## 2020-04-05 DIAGNOSIS — I2693 Single subsegmental pulmonary embolism without acute cor pulmonale: Principal | ICD-10-CM

## 2020-04-05 DIAGNOSIS — Z800 Family history of malignant neoplasm of digestive organs: Principal | ICD-10-CM

## 2020-04-05 DIAGNOSIS — E43 Unspecified severe protein-calorie malnutrition: Principal | ICD-10-CM

## 2020-04-05 DIAGNOSIS — D62 Acute posthemorrhagic anemia: Principal | ICD-10-CM

## 2020-04-05 DIAGNOSIS — I5032 Chronic diastolic (congestive) heart failure: Principal | ICD-10-CM

## 2020-04-05 DIAGNOSIS — Z981 Arthrodesis status: Principal | ICD-10-CM

## 2020-04-05 DIAGNOSIS — R570 Cardiogenic shock: Principal | ICD-10-CM

## 2020-04-05 DIAGNOSIS — Z79899 Other long term (current) drug therapy: Principal | ICD-10-CM

## 2020-04-05 DIAGNOSIS — G8929 Other chronic pain: Principal | ICD-10-CM

## 2020-04-05 DIAGNOSIS — D6832 Hemorrhagic disorder due to extrinsic circulating anticoagulants: Principal | ICD-10-CM

## 2020-04-05 DIAGNOSIS — J9601 Acute respiratory failure with hypoxia: Principal | ICD-10-CM

## 2020-04-05 DIAGNOSIS — U071 COVID-19: Principal | ICD-10-CM

## 2020-04-05 DIAGNOSIS — Z951 Presence of aortocoronary bypass graft: Principal | ICD-10-CM

## 2020-04-05 DIAGNOSIS — E1165 Type 2 diabetes mellitus with hyperglycemia: Principal | ICD-10-CM

## 2020-04-05 DIAGNOSIS — Z791 Long term (current) use of non-steroidal anti-inflammatories (NSAID): Principal | ICD-10-CM

## 2020-04-05 DIAGNOSIS — Z96651 Presence of right artificial knee joint: Principal | ICD-10-CM

## 2020-04-05 DIAGNOSIS — I214 Non-ST elevation (NSTEMI) myocardial infarction: Principal | ICD-10-CM

## 2020-04-05 DIAGNOSIS — K7200 Acute and subacute hepatic failure without coma: Principal | ICD-10-CM

## 2020-04-05 DIAGNOSIS — D631 Anemia in chronic kidney disease: Principal | ICD-10-CM

## 2020-04-05 DIAGNOSIS — Z6829 Body mass index (BMI) 29.0-29.9, adult: Principal | ICD-10-CM

## 2020-04-05 DIAGNOSIS — M1990 Unspecified osteoarthritis, unspecified site: Principal | ICD-10-CM

## 2020-04-05 DIAGNOSIS — E785 Hyperlipidemia, unspecified: Principal | ICD-10-CM

## 2020-04-05 DIAGNOSIS — K219 Gastro-esophageal reflux disease without esophagitis: Principal | ICD-10-CM

## 2020-04-05 DIAGNOSIS — F329 Major depressive disorder, single episode, unspecified: Principal | ICD-10-CM

## 2020-04-05 DIAGNOSIS — A4189 Other specified sepsis: Principal | ICD-10-CM

## 2020-04-05 DIAGNOSIS — G9341 Metabolic encephalopathy: Principal | ICD-10-CM

## 2020-04-05 DIAGNOSIS — Z87891 Personal history of nicotine dependence: Principal | ICD-10-CM

## 2020-04-05 DIAGNOSIS — R6521 Severe sepsis with septic shock: Principal | ICD-10-CM

## 2020-04-08 DIAGNOSIS — N1831 Chronic kidney disease, stage 3a: Principal | ICD-10-CM

## 2020-04-08 DIAGNOSIS — D631 Anemia in chronic kidney disease: Principal | ICD-10-CM

## 2020-04-08 DIAGNOSIS — Z951 Presence of aortocoronary bypass graft: Principal | ICD-10-CM

## 2020-04-08 DIAGNOSIS — I214 Non-ST elevation (NSTEMI) myocardial infarction: Principal | ICD-10-CM

## 2020-04-08 DIAGNOSIS — I400 Infective myocarditis: Principal | ICD-10-CM

## 2020-04-08 DIAGNOSIS — Z79899 Other long term (current) drug therapy: Principal | ICD-10-CM

## 2020-04-08 DIAGNOSIS — R570 Cardiogenic shock: Principal | ICD-10-CM

## 2020-04-08 DIAGNOSIS — E43 Unspecified severe protein-calorie malnutrition: Principal | ICD-10-CM

## 2020-04-08 DIAGNOSIS — K922 Gastrointestinal hemorrhage, unspecified: Principal | ICD-10-CM

## 2020-04-08 DIAGNOSIS — Z6829 Body mass index (BMI) 29.0-29.9, adult: Principal | ICD-10-CM

## 2020-04-08 DIAGNOSIS — Z981 Arthrodesis status: Principal | ICD-10-CM

## 2020-04-08 DIAGNOSIS — I2693 Single subsegmental pulmonary embolism without acute cor pulmonale: Principal | ICD-10-CM

## 2020-04-08 DIAGNOSIS — I130 Hypertensive heart and chronic kidney disease with heart failure and stage 1 through stage 4 chronic kidney disease, or unspecified chronic kidney disease: Principal | ICD-10-CM

## 2020-04-08 DIAGNOSIS — I5032 Chronic diastolic (congestive) heart failure: Principal | ICD-10-CM

## 2020-04-08 DIAGNOSIS — Z791 Long term (current) use of non-steroidal anti-inflammatories (NSAID): Principal | ICD-10-CM

## 2020-04-08 DIAGNOSIS — E1165 Type 2 diabetes mellitus with hyperglycemia: Principal | ICD-10-CM

## 2020-04-08 DIAGNOSIS — K7200 Acute and subacute hepatic failure without coma: Principal | ICD-10-CM

## 2020-04-08 DIAGNOSIS — U071 COVID-19: Principal | ICD-10-CM

## 2020-04-08 DIAGNOSIS — E1122 Type 2 diabetes mellitus with diabetic chronic kidney disease: Principal | ICD-10-CM

## 2020-04-08 DIAGNOSIS — J9601 Acute respiratory failure with hypoxia: Principal | ICD-10-CM

## 2020-04-08 DIAGNOSIS — D6832 Hemorrhagic disorder due to extrinsic circulating anticoagulants: Principal | ICD-10-CM

## 2020-04-08 DIAGNOSIS — F329 Major depressive disorder, single episode, unspecified: Principal | ICD-10-CM

## 2020-04-08 DIAGNOSIS — Z23 Encounter for immunization: Principal | ICD-10-CM

## 2020-04-08 DIAGNOSIS — E872 Acidosis: Principal | ICD-10-CM

## 2020-04-08 DIAGNOSIS — Z7984 Long term (current) use of oral hypoglycemic drugs: Principal | ICD-10-CM

## 2020-04-08 DIAGNOSIS — R6521 Severe sepsis with septic shock: Principal | ICD-10-CM

## 2020-04-08 DIAGNOSIS — F0390 Unspecified dementia without behavioral disturbance: Principal | ICD-10-CM

## 2020-04-08 DIAGNOSIS — M1990 Unspecified osteoarthritis, unspecified site: Principal | ICD-10-CM

## 2020-04-08 DIAGNOSIS — T45525A Adverse effect of antithrombotic drugs, initial encounter: Principal | ICD-10-CM

## 2020-04-08 DIAGNOSIS — E871 Hypo-osmolality and hyponatremia: Principal | ICD-10-CM

## 2020-04-08 DIAGNOSIS — D62 Acute posthemorrhagic anemia: Principal | ICD-10-CM

## 2020-04-08 DIAGNOSIS — A4189 Other specified sepsis: Principal | ICD-10-CM

## 2020-04-08 DIAGNOSIS — Z800 Family history of malignant neoplasm of digestive organs: Principal | ICD-10-CM

## 2020-04-08 DIAGNOSIS — G928 Other toxic encephalopathy: Principal | ICD-10-CM

## 2020-04-08 DIAGNOSIS — Z9049 Acquired absence of other specified parts of digestive tract: Principal | ICD-10-CM

## 2020-04-08 DIAGNOSIS — Z96651 Presence of right artificial knee joint: Principal | ICD-10-CM

## 2020-04-08 DIAGNOSIS — E876 Hypokalemia: Principal | ICD-10-CM

## 2020-04-08 DIAGNOSIS — G9341 Metabolic encephalopathy: Principal | ICD-10-CM

## 2020-04-08 DIAGNOSIS — K3189 Other diseases of stomach and duodenum: Principal | ICD-10-CM

## 2020-04-08 DIAGNOSIS — E870 Hyperosmolality and hypernatremia: Principal | ICD-10-CM

## 2020-04-08 DIAGNOSIS — I639 Cerebral infarction, unspecified: Principal | ICD-10-CM

## 2020-04-08 DIAGNOSIS — I2510 Atherosclerotic heart disease of native coronary artery without angina pectoris: Principal | ICD-10-CM

## 2020-04-08 DIAGNOSIS — Z7982 Long term (current) use of aspirin: Principal | ICD-10-CM

## 2020-04-08 DIAGNOSIS — K219 Gastro-esophageal reflux disease without esophagitis: Principal | ICD-10-CM

## 2020-04-08 DIAGNOSIS — Z87891 Personal history of nicotine dependence: Principal | ICD-10-CM

## 2020-04-08 DIAGNOSIS — J1282 Pneumonia due to coronavirus disease 2019: Principal | ICD-10-CM

## 2020-04-08 DIAGNOSIS — N170 Acute kidney failure with tubular necrosis: Principal | ICD-10-CM

## 2020-04-08 DIAGNOSIS — G8929 Other chronic pain: Principal | ICD-10-CM

## 2020-04-08 DIAGNOSIS — I4891 Unspecified atrial fibrillation: Principal | ICD-10-CM

## 2020-04-08 DIAGNOSIS — E785 Hyperlipidemia, unspecified: Principal | ICD-10-CM

## 2020-04-10 DIAGNOSIS — I5032 Chronic diastolic (congestive) heart failure: Principal | ICD-10-CM

## 2020-04-10 DIAGNOSIS — Z9049 Acquired absence of other specified parts of digestive tract: Principal | ICD-10-CM

## 2020-04-10 DIAGNOSIS — I4891 Unspecified atrial fibrillation: Principal | ICD-10-CM

## 2020-04-10 DIAGNOSIS — I2510 Atherosclerotic heart disease of native coronary artery without angina pectoris: Principal | ICD-10-CM

## 2020-04-10 DIAGNOSIS — N1831 Chronic kidney disease, stage 3a: Principal | ICD-10-CM

## 2020-04-10 DIAGNOSIS — Z23 Encounter for immunization: Principal | ICD-10-CM

## 2020-04-10 DIAGNOSIS — Z951 Presence of aortocoronary bypass graft: Principal | ICD-10-CM

## 2020-04-10 DIAGNOSIS — Z79899 Other long term (current) drug therapy: Principal | ICD-10-CM

## 2020-04-10 DIAGNOSIS — F329 Major depressive disorder, single episode, unspecified: Principal | ICD-10-CM

## 2020-04-10 DIAGNOSIS — Z800 Family history of malignant neoplasm of digestive organs: Principal | ICD-10-CM

## 2020-04-10 DIAGNOSIS — R570 Cardiogenic shock: Principal | ICD-10-CM

## 2020-04-10 DIAGNOSIS — T45525A Adverse effect of antithrombotic drugs, initial encounter: Principal | ICD-10-CM

## 2020-04-10 DIAGNOSIS — E1122 Type 2 diabetes mellitus with diabetic chronic kidney disease: Principal | ICD-10-CM

## 2020-04-10 DIAGNOSIS — I130 Hypertensive heart and chronic kidney disease with heart failure and stage 1 through stage 4 chronic kidney disease, or unspecified chronic kidney disease: Principal | ICD-10-CM

## 2020-04-10 DIAGNOSIS — Z981 Arthrodesis status: Principal | ICD-10-CM

## 2020-04-10 DIAGNOSIS — Z7982 Long term (current) use of aspirin: Principal | ICD-10-CM

## 2020-04-10 DIAGNOSIS — E785 Hyperlipidemia, unspecified: Principal | ICD-10-CM

## 2020-04-10 DIAGNOSIS — I2693 Single subsegmental pulmonary embolism without acute cor pulmonale: Principal | ICD-10-CM

## 2020-04-10 DIAGNOSIS — E871 Hypo-osmolality and hyponatremia: Principal | ICD-10-CM

## 2020-04-10 DIAGNOSIS — R6521 Severe sepsis with septic shock: Principal | ICD-10-CM

## 2020-04-10 DIAGNOSIS — E876 Hypokalemia: Principal | ICD-10-CM

## 2020-04-10 DIAGNOSIS — K219 Gastro-esophageal reflux disease without esophagitis: Principal | ICD-10-CM

## 2020-04-10 DIAGNOSIS — Z87891 Personal history of nicotine dependence: Principal | ICD-10-CM

## 2020-04-10 DIAGNOSIS — G8929 Other chronic pain: Principal | ICD-10-CM

## 2020-04-10 DIAGNOSIS — D6832 Hemorrhagic disorder due to extrinsic circulating anticoagulants: Principal | ICD-10-CM

## 2020-04-10 DIAGNOSIS — F0390 Unspecified dementia without behavioral disturbance: Principal | ICD-10-CM

## 2020-04-10 DIAGNOSIS — D62 Acute posthemorrhagic anemia: Principal | ICD-10-CM

## 2020-04-10 DIAGNOSIS — I639 Cerebral infarction, unspecified: Principal | ICD-10-CM

## 2020-04-10 DIAGNOSIS — J1282 Pneumonia due to coronavirus disease 2019: Principal | ICD-10-CM

## 2020-04-10 DIAGNOSIS — M1990 Unspecified osteoarthritis, unspecified site: Principal | ICD-10-CM

## 2020-04-10 DIAGNOSIS — G928 Other toxic encephalopathy: Principal | ICD-10-CM

## 2020-04-10 DIAGNOSIS — E872 Acidosis: Principal | ICD-10-CM

## 2020-04-10 DIAGNOSIS — K922 Gastrointestinal hemorrhage, unspecified: Principal | ICD-10-CM

## 2020-04-10 DIAGNOSIS — A4189 Other specified sepsis: Principal | ICD-10-CM

## 2020-04-10 DIAGNOSIS — I214 Non-ST elevation (NSTEMI) myocardial infarction: Principal | ICD-10-CM

## 2020-04-10 DIAGNOSIS — I400 Infective myocarditis: Principal | ICD-10-CM

## 2020-04-10 DIAGNOSIS — D631 Anemia in chronic kidney disease: Principal | ICD-10-CM

## 2020-04-10 DIAGNOSIS — Z7984 Long term (current) use of oral hypoglycemic drugs: Principal | ICD-10-CM

## 2020-04-10 DIAGNOSIS — U071 COVID-19: Principal | ICD-10-CM

## 2020-04-10 DIAGNOSIS — Z6829 Body mass index (BMI) 29.0-29.9, adult: Principal | ICD-10-CM

## 2020-04-10 DIAGNOSIS — Z791 Long term (current) use of non-steroidal anti-inflammatories (NSAID): Principal | ICD-10-CM

## 2020-04-10 DIAGNOSIS — E43 Unspecified severe protein-calorie malnutrition: Principal | ICD-10-CM

## 2020-04-10 DIAGNOSIS — N170 Acute kidney failure with tubular necrosis: Principal | ICD-10-CM

## 2020-04-10 DIAGNOSIS — Z96651 Presence of right artificial knee joint: Principal | ICD-10-CM

## 2020-04-10 DIAGNOSIS — E870 Hyperosmolality and hypernatremia: Principal | ICD-10-CM

## 2020-04-10 DIAGNOSIS — E1165 Type 2 diabetes mellitus with hyperglycemia: Principal | ICD-10-CM

## 2020-04-10 DIAGNOSIS — G9341 Metabolic encephalopathy: Principal | ICD-10-CM

## 2020-04-10 DIAGNOSIS — J9601 Acute respiratory failure with hypoxia: Principal | ICD-10-CM

## 2020-04-10 DIAGNOSIS — K7200 Acute and subacute hepatic failure without coma: Principal | ICD-10-CM

## 2020-04-10 DIAGNOSIS — K3189 Other diseases of stomach and duodenum: Principal | ICD-10-CM

## 2020-04-11 DIAGNOSIS — N170 Acute kidney failure with tubular necrosis: Principal | ICD-10-CM

## 2020-04-11 DIAGNOSIS — E871 Hypo-osmolality and hyponatremia: Principal | ICD-10-CM

## 2020-04-11 DIAGNOSIS — E872 Acidosis: Principal | ICD-10-CM

## 2020-04-11 DIAGNOSIS — I2693 Single subsegmental pulmonary embolism without acute cor pulmonale: Principal | ICD-10-CM

## 2020-04-11 DIAGNOSIS — E1122 Type 2 diabetes mellitus with diabetic chronic kidney disease: Principal | ICD-10-CM

## 2020-04-11 DIAGNOSIS — Z951 Presence of aortocoronary bypass graft: Principal | ICD-10-CM

## 2020-04-11 DIAGNOSIS — K219 Gastro-esophageal reflux disease without esophagitis: Principal | ICD-10-CM

## 2020-04-11 DIAGNOSIS — J9601 Acute respiratory failure with hypoxia: Principal | ICD-10-CM

## 2020-04-11 DIAGNOSIS — Z9049 Acquired absence of other specified parts of digestive tract: Principal | ICD-10-CM

## 2020-04-11 DIAGNOSIS — D62 Acute posthemorrhagic anemia: Principal | ICD-10-CM

## 2020-04-11 DIAGNOSIS — Z6829 Body mass index (BMI) 29.0-29.9, adult: Principal | ICD-10-CM

## 2020-04-11 DIAGNOSIS — Z7984 Long term (current) use of oral hypoglycemic drugs: Principal | ICD-10-CM

## 2020-04-11 DIAGNOSIS — Z800 Family history of malignant neoplasm of digestive organs: Principal | ICD-10-CM

## 2020-04-11 DIAGNOSIS — I214 Non-ST elevation (NSTEMI) myocardial infarction: Principal | ICD-10-CM

## 2020-04-11 DIAGNOSIS — R570 Cardiogenic shock: Principal | ICD-10-CM

## 2020-04-11 DIAGNOSIS — I400 Infective myocarditis: Principal | ICD-10-CM

## 2020-04-11 DIAGNOSIS — G8929 Other chronic pain: Principal | ICD-10-CM

## 2020-04-11 DIAGNOSIS — M1990 Unspecified osteoarthritis, unspecified site: Principal | ICD-10-CM

## 2020-04-11 DIAGNOSIS — I5032 Chronic diastolic (congestive) heart failure: Principal | ICD-10-CM

## 2020-04-11 DIAGNOSIS — U071 COVID-19: Principal | ICD-10-CM

## 2020-04-11 DIAGNOSIS — Z981 Arthrodesis status: Principal | ICD-10-CM

## 2020-04-11 DIAGNOSIS — I639 Cerebral infarction, unspecified: Principal | ICD-10-CM

## 2020-04-11 DIAGNOSIS — E785 Hyperlipidemia, unspecified: Principal | ICD-10-CM

## 2020-04-11 DIAGNOSIS — G928 Other toxic encephalopathy: Principal | ICD-10-CM

## 2020-04-11 DIAGNOSIS — K922 Gastrointestinal hemorrhage, unspecified: Principal | ICD-10-CM

## 2020-04-11 DIAGNOSIS — T45525A Adverse effect of antithrombotic drugs, initial encounter: Principal | ICD-10-CM

## 2020-04-11 DIAGNOSIS — N1831 Chronic kidney disease, stage 3a: Principal | ICD-10-CM

## 2020-04-11 DIAGNOSIS — Z791 Long term (current) use of non-steroidal anti-inflammatories (NSAID): Principal | ICD-10-CM

## 2020-04-11 DIAGNOSIS — I4891 Unspecified atrial fibrillation: Principal | ICD-10-CM

## 2020-04-11 DIAGNOSIS — D631 Anemia in chronic kidney disease: Principal | ICD-10-CM

## 2020-04-11 DIAGNOSIS — J1282 Pneumonia due to coronavirus disease 2019: Principal | ICD-10-CM

## 2020-04-11 DIAGNOSIS — A4189 Other specified sepsis: Principal | ICD-10-CM

## 2020-04-11 DIAGNOSIS — D6832 Hemorrhagic disorder due to extrinsic circulating anticoagulants: Principal | ICD-10-CM

## 2020-04-11 DIAGNOSIS — E870 Hyperosmolality and hypernatremia: Principal | ICD-10-CM

## 2020-04-11 DIAGNOSIS — I2510 Atherosclerotic heart disease of native coronary artery without angina pectoris: Principal | ICD-10-CM

## 2020-04-11 DIAGNOSIS — G9341 Metabolic encephalopathy: Principal | ICD-10-CM

## 2020-04-11 DIAGNOSIS — F0390 Unspecified dementia without behavioral disturbance: Principal | ICD-10-CM

## 2020-04-11 DIAGNOSIS — E876 Hypokalemia: Principal | ICD-10-CM

## 2020-04-11 DIAGNOSIS — K7200 Acute and subacute hepatic failure without coma: Principal | ICD-10-CM

## 2020-04-11 DIAGNOSIS — Z96651 Presence of right artificial knee joint: Principal | ICD-10-CM

## 2020-04-11 DIAGNOSIS — Z7982 Long term (current) use of aspirin: Principal | ICD-10-CM

## 2020-04-11 DIAGNOSIS — R6521 Severe sepsis with septic shock: Principal | ICD-10-CM

## 2020-04-11 DIAGNOSIS — Z79899 Other long term (current) drug therapy: Principal | ICD-10-CM

## 2020-04-11 DIAGNOSIS — K3189 Other diseases of stomach and duodenum: Principal | ICD-10-CM

## 2020-04-11 DIAGNOSIS — I130 Hypertensive heart and chronic kidney disease with heart failure and stage 1 through stage 4 chronic kidney disease, or unspecified chronic kidney disease: Principal | ICD-10-CM

## 2020-04-11 DIAGNOSIS — E1165 Type 2 diabetes mellitus with hyperglycemia: Principal | ICD-10-CM

## 2020-04-11 DIAGNOSIS — Z23 Encounter for immunization: Principal | ICD-10-CM

## 2020-04-11 DIAGNOSIS — E43 Unspecified severe protein-calorie malnutrition: Principal | ICD-10-CM

## 2020-04-11 DIAGNOSIS — Z87891 Personal history of nicotine dependence: Principal | ICD-10-CM

## 2020-04-11 DIAGNOSIS — F329 Major depressive disorder, single episode, unspecified: Principal | ICD-10-CM

## 2020-04-17 MED ORDER — POLYETHYLENE GLYCOL 3350 17 GRAM ORAL POWDER PACKET
Freq: Every day | ORAL | 0 refills | 0 days | PRN
Start: 2020-04-17 — End: ?

## 2020-04-17 MED ORDER — OMEPRAZOLE 20 MG CAPSULE,DELAYED RELEASE
ORAL_CAPSULE | Freq: Every day | ORAL | 0 refills | 30 days
Start: 2020-04-17 — End: ?

## 2020-04-17 MED ORDER — MELATONIN 3 MG TABLET
Freq: Every evening | ORAL | 0 refills | 0 days
Start: 2020-04-17 — End: ?

## 2020-04-17 MED ORDER — APIXABAN 5 MG TABLET
Freq: Two times a day (BID) | ORAL | 0 refills | 0.00000 days
Start: 2020-04-17 — End: 2020-05-28

## 2020-04-17 MED ORDER — ACETAMINOPHEN 325 MG TABLET
Freq: Four times a day (QID) | ORAL | 0 refills | 0 days | PRN
Start: 2020-04-17 — End: ?

## 2020-04-18 MED ORDER — ATORVASTATIN 40 MG TABLET
ORAL_TABLET | Freq: Every day | ORAL | 0 refills | 30 days
Start: 2020-04-18 — End: ?

## 2020-04-19 ENCOUNTER — Encounter: Admit: 2020-04-19 | Discharge: 2020-04-20 | Payer: MEDICARE

## 2020-04-23 MED ORDER — CYANOCOBALAMIN (VIT B-12) 1,000 MCG/ML INJECTION SOLUTION
SUBCUTANEOUS | 1 refills | 300 days
Start: 2020-04-23 — End: ?

## 2020-05-03 ENCOUNTER — Encounter: Admit: 2020-05-03 | Discharge: 2020-05-04 | Payer: MEDICARE

## 2020-05-15 ENCOUNTER — Encounter: Admit: 2020-05-15 | Discharge: 2020-05-15 | Payer: MEDICARE | Attending: Internal Medicine | Primary: Internal Medicine

## 2020-05-15 DIAGNOSIS — E119 Type 2 diabetes mellitus without complications: Principal | ICD-10-CM

## 2020-05-17 ENCOUNTER — Encounter: Admit: 2020-05-17 | Discharge: 2020-05-17 | Payer: MEDICARE | Attending: Internal Medicine | Primary: Internal Medicine

## 2020-05-17 DIAGNOSIS — A0472 Enterocolitis due to Clostridium difficile, not specified as recurrent: Principal | ICD-10-CM

## 2020-05-18 ENCOUNTER — Encounter: Admit: 2020-05-18 | Discharge: 2020-05-19 | Payer: MEDICARE

## 2020-05-21 MED ORDER — METOPROLOL SUCCINATE ER 100 MG TABLET,EXTENDED RELEASE 24 HR
ORAL_TABLET | 1 refills | 0 days | Status: CP
Start: 2020-05-21 — End: ?

## 2020-05-23 ENCOUNTER — Encounter: Admit: 2020-05-23 | Discharge: 2020-05-24 | Payer: MEDICARE

## 2020-05-23 DIAGNOSIS — I639 Cerebral infarction, unspecified: Principal | ICD-10-CM

## 2020-05-23 DIAGNOSIS — I2699 Other pulmonary embolism without acute cor pulmonale: Principal | ICD-10-CM

## 2020-05-23 DIAGNOSIS — E119 Type 2 diabetes mellitus without complications: Principal | ICD-10-CM

## 2020-05-23 DIAGNOSIS — J9601 Acute respiratory failure with hypoxia: Principal | ICD-10-CM

## 2020-05-23 DIAGNOSIS — R5381 Other malaise: Principal | ICD-10-CM

## 2020-05-23 MED ORDER — BLOOD-GLUCOSE METER KIT WRAPPER
PACK | 0 refills | 0 days | Status: CP
Start: 2020-05-23 — End: ?

## 2020-05-28 MED ORDER — APIXABAN 5 MG TABLET
ORAL_TABLET | Freq: Two times a day (BID) | ORAL | 5 refills | 30 days | Status: CP
Start: 2020-05-28 — End: ?

## 2020-05-30 DIAGNOSIS — K219 Gastro-esophageal reflux disease without esophagitis: Principal | ICD-10-CM

## 2020-05-30 MED ORDER — OMEPRAZOLE 20 MG CAPSULE,DELAYED RELEASE
ORAL_CAPSULE | Freq: Every day | ORAL | 2 refills | 30 days | Status: CP
Start: 2020-05-30 — End: ?

## 2020-06-15 ENCOUNTER — Encounter: Admit: 2020-06-15 | Discharge: 2020-06-16 | Payer: MEDICARE

## 2020-06-15 DIAGNOSIS — E538 Deficiency of other specified B group vitamins: Principal | ICD-10-CM

## 2020-06-15 DIAGNOSIS — K219 Gastro-esophageal reflux disease without esophagitis: Principal | ICD-10-CM

## 2020-06-15 DIAGNOSIS — R0600 Dyspnea, unspecified: Principal | ICD-10-CM

## 2020-06-15 DIAGNOSIS — J9601 Acute respiratory failure with hypoxia: Principal | ICD-10-CM

## 2020-06-15 DIAGNOSIS — R5381 Other malaise: Principal | ICD-10-CM

## 2020-06-15 DIAGNOSIS — E559 Vitamin D deficiency, unspecified: Principal | ICD-10-CM

## 2020-06-15 DIAGNOSIS — B354 Tinea corporis: Principal | ICD-10-CM

## 2020-06-15 MED ORDER — NYSTATIN 100,000 UNIT/GRAM TOPICAL CREAM
Freq: Two times a day (BID) | TOPICAL | 0 refills | 30 days | Status: CP
Start: 2020-06-15 — End: 2021-06-15

## 2020-06-15 MED ORDER — ALBUTEROL SULFATE HFA 90 MCG/ACTUATION AEROSOL INHALER
Freq: Four times a day (QID) | RESPIRATORY_TRACT | 0 refills | 0 days | Status: CP | PRN
Start: 2020-06-15 — End: 2021-06-15

## 2020-06-15 MED ORDER — CYANOCOBALAMIN (VIT B-12) 1,000 MCG/ML INJECTION SOLUTION
SUBCUTANEOUS | 1 refills | 300 days | Status: CP
Start: 2020-06-15 — End: ?

## 2020-06-15 MED ORDER — BLOOD GLUCOSE TEST STRIPS
ORAL_STRIP | 0 refills | 0 days | Status: CP
Start: 2020-06-15 — End: ?

## 2020-06-15 MED ORDER — APIXABAN 5 MG TABLET
ORAL_TABLET | Freq: Two times a day (BID) | ORAL | 2 refills | 30 days | Status: CP
Start: 2020-06-15 — End: ?

## 2020-06-15 MED ORDER — ERGOCALCIFEROL (VITAMIN D2) 1,250 MCG (50,000 UNIT) CAPSULE
ORAL_CAPSULE | ORAL | 1 refills | 84 days | Status: CP
Start: 2020-06-15 — End: ?

## 2020-06-15 MED ORDER — OMEPRAZOLE 20 MG CAPSULE,DELAYED RELEASE
ORAL_CAPSULE | Freq: Two times a day (BID) | ORAL | 3 refills | 30 days | Status: CP
Start: 2020-06-15 — End: ?

## 2020-06-18 MED ORDER — ATORVASTATIN 40 MG TABLET
ORAL_TABLET | 0 refills | 0 days | Status: CP
Start: 2020-06-18 — End: ?

## 2020-06-22 MED ORDER — GLIPIZIDE 5 MG TABLET
ORAL_TABLET | 0 refills | 0 days | Status: CP
Start: 2020-06-22 — End: ?

## 2020-06-28 ENCOUNTER — Ambulatory Visit: Admit: 2020-06-28 | Discharge: 2020-06-29 | Payer: MEDICARE | Attending: Internal Medicine | Primary: Internal Medicine

## 2020-06-28 DIAGNOSIS — J9601 Acute respiratory failure with hypoxia: Principal | ICD-10-CM

## 2020-06-28 DIAGNOSIS — R0600 Dyspnea, unspecified: Principal | ICD-10-CM

## 2020-07-10 MED ORDER — ATORVASTATIN 40 MG TABLET
ORAL_TABLET | 0 refills | 0 days | Status: CP
Start: 2020-07-10 — End: ?

## 2020-07-13 ENCOUNTER — Encounter: Admit: 2020-07-13 | Discharge: 2020-07-14 | Payer: MEDICARE

## 2020-07-13 DIAGNOSIS — R0600 Dyspnea, unspecified: Principal | ICD-10-CM

## 2020-07-13 DIAGNOSIS — R0781 Pleurodynia: Principal | ICD-10-CM

## 2020-07-13 DIAGNOSIS — F332 Major depressive disorder, recurrent severe without psychotic features: Principal | ICD-10-CM

## 2020-07-13 DIAGNOSIS — E119 Type 2 diabetes mellitus without complications: Principal | ICD-10-CM

## 2020-07-13 DIAGNOSIS — L659 Nonscarring hair loss, unspecified: Principal | ICD-10-CM

## 2020-07-20 DIAGNOSIS — J90 Pleural effusion, not elsewhere classified: Principal | ICD-10-CM

## 2020-07-20 MED ORDER — AZITHROMYCIN 250 MG TABLET
ORAL_TABLET | 0 refills | 0 days | Status: CP
Start: 2020-07-20 — End: ?

## 2020-07-20 MED ORDER — GLIPIZIDE 5 MG TABLET
ORAL_TABLET | 0 refills | 0 days | Status: CP
Start: 2020-07-20 — End: ?

## 2020-07-20 MED ORDER — ATORVASTATIN 40 MG TABLET
ORAL_TABLET | 0 refills | 0 days | Status: CP
Start: 2020-07-20 — End: ?

## 2020-07-29 ENCOUNTER — Ambulatory Visit: Admit: 2020-07-29 | Discharge: 2020-07-29 | Disposition: A | Discharge status: Home / Self Care | Payer: MEDICARE

## 2020-07-29 ENCOUNTER — Emergency Department: Admit: 2020-07-29 | Discharge: 2020-07-29 | Disposition: A | Discharge status: Home / Self Care | Payer: MEDICARE

## 2020-07-29 ENCOUNTER — Ambulatory Visit: Admit: 2020-07-29 | Discharge: 2020-07-30 | Disposition: A | Discharge status: Home / Self Care | Payer: MEDICARE

## 2020-07-29 DIAGNOSIS — R55 Syncope and collapse: Principal | ICD-10-CM

## 2020-07-29 DIAGNOSIS — G9001 Carotid sinus syncope: Principal | ICD-10-CM

## 2020-08-07 ENCOUNTER — Encounter: Admit: 2020-08-07 | Discharge: 2020-08-08 | Payer: MEDICARE | Attending: Internal Medicine | Primary: Internal Medicine

## 2020-08-07 DIAGNOSIS — I259 Chronic ischemic heart disease, unspecified: Principal | ICD-10-CM

## 2020-08-07 DIAGNOSIS — R55 Syncope and collapse: Principal | ICD-10-CM

## 2020-08-07 DIAGNOSIS — R0600 Dyspnea, unspecified: Principal | ICD-10-CM

## 2020-08-07 DIAGNOSIS — Z8616 History of COVID-19: Principal | ICD-10-CM

## 2020-08-07 DIAGNOSIS — I240 Acute coronary thrombosis not resulting in myocardial infarction: Principal | ICD-10-CM

## 2020-08-07 DIAGNOSIS — I4892 Unspecified atrial flutter: Principal | ICD-10-CM

## 2020-08-10 MED ORDER — ONETOUCH VERIO TEST STRIPS
ORAL_STRIP | 0 refills | 0 days | Status: CP
Start: 2020-08-10 — End: ?

## 2020-08-13 MED ORDER — GLIPIZIDE 5 MG TABLET
ORAL_TABLET | 0 refills | 0 days | Status: CP
Start: 2020-08-13 — End: ?

## 2020-08-17 ENCOUNTER — Encounter: Admit: 2020-08-17 | Discharge: 2020-08-18 | Payer: MEDICARE

## 2020-08-17 DIAGNOSIS — E119 Type 2 diabetes mellitus without complications: Principal | ICD-10-CM

## 2020-08-17 DIAGNOSIS — M898X2 Other specified disorders of bone, upper arm: Principal | ICD-10-CM

## 2020-08-17 DIAGNOSIS — R519 Generalized headaches: Principal | ICD-10-CM

## 2020-08-17 DIAGNOSIS — G4700 Insomnia, unspecified: Principal | ICD-10-CM

## 2020-08-17 DIAGNOSIS — U099 Post-COVID syndrome: Principal | ICD-10-CM

## 2020-08-17 DIAGNOSIS — M25521 Pain in right elbow: Principal | ICD-10-CM

## 2020-08-17 DIAGNOSIS — F332 Major depressive disorder, recurrent severe without psychotic features: Principal | ICD-10-CM

## 2020-08-17 MED ORDER — TRAZODONE 50 MG TABLET
ORAL_TABLET | 0 refills | 0 days | Status: CP
Start: 2020-08-17 — End: ?

## 2020-08-20 MED ORDER — GLIPIZIDE 5 MG TABLET
ORAL_TABLET | 0 refills | 0 days | Status: CP
Start: 2020-08-20 — End: ?

## 2020-08-22 DIAGNOSIS — M79602 Pain in left arm: Principal | ICD-10-CM

## 2020-09-04 MED ORDER — ATORVASTATIN 40 MG TABLET
ORAL_TABLET | 0 refills | 0 days | Status: CP
Start: 2020-09-04 — End: ?

## 2020-09-12 MED ORDER — ELIQUIS 5 MG TABLET
ORAL_TABLET | 2 refills | 0 days | Status: CP
Start: 2020-09-12 — End: ?

## 2020-09-17 ENCOUNTER — Encounter: Admit: 2020-09-17 | Discharge: 2020-09-18 | Payer: MEDICARE

## 2020-09-17 DIAGNOSIS — U099 Post-COVID syndrome: Principal | ICD-10-CM

## 2020-09-17 DIAGNOSIS — G8929 Other chronic pain: Principal | ICD-10-CM

## 2020-09-17 DIAGNOSIS — M898X2 Other specified disorders of bone, upper arm: Principal | ICD-10-CM

## 2020-09-17 DIAGNOSIS — R059 Cough: Principal | ICD-10-CM

## 2020-09-17 DIAGNOSIS — M79621 Pain in right upper arm: Principal | ICD-10-CM

## 2020-09-17 DIAGNOSIS — M79671 Pain in right foot: Principal | ICD-10-CM

## 2020-09-17 DIAGNOSIS — M79672 Pain in left foot: Principal | ICD-10-CM

## 2020-09-17 DIAGNOSIS — G4700 Insomnia, unspecified: Principal | ICD-10-CM

## 2020-09-17 DIAGNOSIS — H6593 Unspecified nonsuppurative otitis media, bilateral: Principal | ICD-10-CM

## 2020-09-17 MED ORDER — PREGABALIN 25 MG CAPSULE
ORAL_CAPSULE | Freq: Two times a day (BID) | ORAL | 2 refills | 30 days | Status: CP
Start: 2020-09-17 — End: 2021-09-17

## 2020-09-17 MED ORDER — FLUTICASONE PROPIONATE 50 MCG/ACTUATION NASAL SPRAY,SUSPENSION
Freq: Every day | NASAL | 5 refills | 0 days | Status: CP
Start: 2020-09-17 — End: ?

## 2020-09-17 MED ORDER — PREDNISONE 20 MG TABLET
ORAL_TABLET | 0 refills | 0 days | Status: CP
Start: 2020-09-17 — End: ?

## 2020-09-20 DIAGNOSIS — K219 Gastro-esophageal reflux disease without esophagitis: Principal | ICD-10-CM

## 2020-09-21 MED ORDER — OMEPRAZOLE 20 MG CAPSULE,DELAYED RELEASE
ORAL_CAPSULE | 1 refills | 0 days | Status: CP
Start: 2020-09-21 — End: ?

## 2020-10-01 MED ORDER — ONETOUCH VERIO TEST STRIPS
ORAL_STRIP | 0 refills | 0 days | Status: CP
Start: 2020-10-01 — End: ?

## 2020-10-02 MED ORDER — ATORVASTATIN 40 MG TABLET
ORAL_TABLET | 0 refills | 0 days | Status: CP
Start: 2020-10-02 — End: ?

## 2020-10-02 MED ORDER — GLIPIZIDE 5 MG TABLET
ORAL_TABLET | 0 refills | 0 days | Status: CP
Start: 2020-10-02 — End: ?

## 2020-10-08 DIAGNOSIS — G4700 Insomnia, unspecified: Principal | ICD-10-CM

## 2020-10-10 MED ORDER — TRAZODONE 50 MG TABLET
ORAL_TABLET | 1 refills | 0 days | Status: CP
Start: 2020-10-10 — End: ?

## 2020-10-16 ENCOUNTER — Encounter: Admit: 2020-10-16 | Discharge: 2020-10-17 | Payer: MEDICARE

## 2020-10-16 DIAGNOSIS — H2513 Age-related nuclear cataract, bilateral: Principal | ICD-10-CM

## 2020-10-19 MED ORDER — GLIPIZIDE 5 MG TABLET
ORAL_TABLET | 0 refills | 0 days | Status: CP
Start: 2020-10-19 — End: ?

## 2020-10-23 ENCOUNTER — Ambulatory Visit: Admit: 2020-10-23 | Payer: MEDICARE

## 2020-10-26 MED ORDER — ATORVASTATIN 40 MG TABLET
ORAL_TABLET | 0 refills | 0 days | Status: CP
Start: 2020-10-26 — End: ?

## 2020-11-12 MED ORDER — METOPROLOL SUCCINATE ER 100 MG TABLET,EXTENDED RELEASE 24 HR
ORAL_TABLET | 1 refills | 0 days | Status: CP
Start: 2020-11-12 — End: ?

## 2020-11-16 ENCOUNTER — Encounter: Admit: 2020-11-16 | Discharge: 2020-11-17 | Payer: MEDICARE

## 2020-11-16 DIAGNOSIS — H543 Unqualified visual loss, both eyes: Principal | ICD-10-CM

## 2020-11-16 MED ORDER — GLIPIZIDE 5 MG TABLET
ORAL_TABLET | 0 refills | 0 days | Status: CP
Start: 2020-11-16 — End: ?

## 2020-11-19 ENCOUNTER — Encounter: Admit: 2020-11-19 | Discharge: 2020-11-20 | Payer: MEDICARE

## 2020-11-19 DIAGNOSIS — M79602 Pain in left arm: Principal | ICD-10-CM

## 2020-11-19 DIAGNOSIS — G4700 Insomnia, unspecified: Principal | ICD-10-CM

## 2020-11-19 DIAGNOSIS — M79671 Pain in right foot: Principal | ICD-10-CM

## 2020-11-19 DIAGNOSIS — R0600 Dyspnea, unspecified: Principal | ICD-10-CM

## 2020-11-19 DIAGNOSIS — M79672 Pain in left foot: Principal | ICD-10-CM

## 2020-11-19 DIAGNOSIS — G8929 Other chronic pain: Principal | ICD-10-CM

## 2020-11-19 DIAGNOSIS — I259 Chronic ischemic heart disease, unspecified: Principal | ICD-10-CM

## 2020-11-19 DIAGNOSIS — M67819 Other specified disorders of synovium and tendon, unspecified shoulder: Principal | ICD-10-CM

## 2020-11-19 DIAGNOSIS — M19012 Primary osteoarthritis, left shoulder: Principal | ICD-10-CM

## 2020-11-19 DIAGNOSIS — M19011 Primary osteoarthritis, right shoulder: Principal | ICD-10-CM

## 2020-11-19 DIAGNOSIS — R0602 Shortness of breath: Principal | ICD-10-CM

## 2020-11-19 MED ORDER — NITROGLYCERIN 0.4 MG SUBLINGUAL TABLET
ORAL_TABLET | SUBLINGUAL | 3 refills | 1 days | Status: CP | PRN
Start: 2020-11-19 — End: 2021-11-19

## 2020-11-22 ENCOUNTER — Ambulatory Visit: Admit: 2020-11-22 | Discharge: 2020-11-23 | Payer: MEDICARE

## 2020-11-22 DIAGNOSIS — M7582 Other shoulder lesions, left shoulder: Principal | ICD-10-CM

## 2020-11-22 DIAGNOSIS — M5412 Radiculopathy, cervical region: Principal | ICD-10-CM

## 2020-11-22 DIAGNOSIS — M7522 Bicipital tendinitis, left shoulder: Principal | ICD-10-CM

## 2020-11-22 DIAGNOSIS — M19012 Primary osteoarthritis, left shoulder: Principal | ICD-10-CM

## 2020-11-26 MED ORDER — ATORVASTATIN 40 MG TABLET
ORAL_TABLET | 0 refills | 0 days | Status: CP
Start: 2020-11-26 — End: ?

## 2020-11-27 ENCOUNTER — Ambulatory Visit: Admit: 2020-11-27 | Discharge: 2020-11-28 | Payer: MEDICARE

## 2020-11-27 DIAGNOSIS — J9601 Acute respiratory failure with hypoxia: Principal | ICD-10-CM

## 2020-11-27 DIAGNOSIS — R0600 Dyspnea, unspecified: Principal | ICD-10-CM

## 2020-12-06 MED ORDER — ONETOUCH VERIO TEST STRIPS
ORAL_STRIP | 0 refills | 0 days | Status: CP
Start: 2020-12-06 — End: ?

## 2020-12-11 ENCOUNTER — Ambulatory Visit: Admit: 2020-12-11 | Discharge: 2020-12-12 | Payer: MEDICARE | Attending: Internal Medicine | Primary: Internal Medicine

## 2020-12-11 DIAGNOSIS — G4700 Insomnia, unspecified: Principal | ICD-10-CM

## 2020-12-11 DIAGNOSIS — I4892 Unspecified atrial flutter: Principal | ICD-10-CM

## 2020-12-11 DIAGNOSIS — R0600 Dyspnea, unspecified: Principal | ICD-10-CM

## 2020-12-11 DIAGNOSIS — R600 Localized edema: Principal | ICD-10-CM

## 2020-12-11 DIAGNOSIS — I259 Chronic ischemic heart disease, unspecified: Principal | ICD-10-CM

## 2020-12-11 DIAGNOSIS — R0602 Shortness of breath: Principal | ICD-10-CM

## 2020-12-11 DIAGNOSIS — I240 Acute coronary thrombosis not resulting in myocardial infarction: Principal | ICD-10-CM

## 2020-12-11 MED ORDER — TRAZODONE 50 MG TABLET
ORAL_TABLET | 0 refills | 0.00000 days | Status: CP
Start: 2020-12-11 — End: 2020-12-11

## 2020-12-13 ENCOUNTER — Ambulatory Visit: Admit: 2020-12-13 | Discharge: 2020-12-14 | Payer: MEDICARE | Attending: Retina Specialist | Primary: Retina Specialist

## 2020-12-13 DIAGNOSIS — H2513 Age-related nuclear cataract, bilateral: Principal | ICD-10-CM

## 2020-12-13 DIAGNOSIS — H43821 Vitreomacular adhesion, right eye: Principal | ICD-10-CM

## 2020-12-13 DIAGNOSIS — H543 Unqualified visual loss, both eyes: Principal | ICD-10-CM

## 2020-12-14 MED ORDER — GLIPIZIDE 5 MG TABLET
ORAL_TABLET | 0 refills | 0 days | Status: CP
Start: 2020-12-14 — End: ?

## 2020-12-14 MED ORDER — ELIQUIS 5 MG TABLET
ORAL_TABLET | 2 refills | 0 days | Status: CP
Start: 2020-12-14 — End: ?

## 2020-12-24 ENCOUNTER — Ambulatory Visit: Admit: 2020-12-24 | Discharge: 2020-12-25 | Payer: MEDICARE

## 2020-12-24 DIAGNOSIS — M79672 Pain in left foot: Principal | ICD-10-CM

## 2020-12-24 DIAGNOSIS — R0602 Shortness of breath: Principal | ICD-10-CM

## 2020-12-24 DIAGNOSIS — M79602 Pain in left arm: Principal | ICD-10-CM

## 2020-12-24 DIAGNOSIS — M79621 Pain in right upper arm: Principal | ICD-10-CM

## 2020-12-24 DIAGNOSIS — M898X2 Other specified disorders of bone, upper arm: Principal | ICD-10-CM

## 2020-12-24 DIAGNOSIS — G8929 Other chronic pain: Principal | ICD-10-CM

## 2020-12-24 DIAGNOSIS — R7303 Prediabetes: Principal | ICD-10-CM

## 2020-12-24 DIAGNOSIS — M79671 Pain in right foot: Principal | ICD-10-CM

## 2020-12-24 DIAGNOSIS — R0600 Dyspnea, unspecified: Principal | ICD-10-CM

## 2020-12-24 DIAGNOSIS — G4700 Insomnia, unspecified: Principal | ICD-10-CM

## 2020-12-24 DIAGNOSIS — R2681 Unsteadiness on feet: Principal | ICD-10-CM

## 2020-12-24 DIAGNOSIS — D649 Anemia, unspecified: Principal | ICD-10-CM

## 2020-12-24 DIAGNOSIS — R7989 Other specified abnormal findings of blood chemistry: Principal | ICD-10-CM

## 2020-12-24 MED ORDER — PREGABALIN 25 MG CAPSULE
ORAL_CAPSULE | Freq: Two times a day (BID) | ORAL | 0 refills | 90 days | Status: CP
Start: 2020-12-24 — End: 2021-12-24

## 2020-12-25 MED ORDER — PREGABALIN 25 MG CAPSULE
ORAL_CAPSULE | Freq: Two times a day (BID) | ORAL | 2 refills | 30 days | Status: CP
Start: 2020-12-25 — End: 2021-12-25

## 2020-12-26 ENCOUNTER — Ambulatory Visit: Admit: 2020-12-26 | Discharge: 2020-12-27 | Payer: MEDICARE

## 2020-12-26 DIAGNOSIS — J9601 Acute respiratory failure with hypoxia: Principal | ICD-10-CM

## 2020-12-26 DIAGNOSIS — R0600 Dyspnea, unspecified: Principal | ICD-10-CM

## 2020-12-26 DIAGNOSIS — J849 Interstitial pulmonary disease, unspecified: Principal | ICD-10-CM

## 2020-12-26 DIAGNOSIS — N289 Disorder of kidney and ureter, unspecified: Principal | ICD-10-CM

## 2020-12-26 DIAGNOSIS — R5381 Other malaise: Principal | ICD-10-CM

## 2020-12-28 ENCOUNTER — Ambulatory Visit: Admit: 2020-12-28 | Discharge: 2020-12-29 | Payer: MEDICARE

## 2020-12-28 DIAGNOSIS — R600 Localized edema: Principal | ICD-10-CM

## 2020-12-28 DIAGNOSIS — I259 Chronic ischemic heart disease, unspecified: Principal | ICD-10-CM

## 2020-12-28 DIAGNOSIS — I240 Acute coronary thrombosis not resulting in myocardial infarction: Principal | ICD-10-CM

## 2020-12-28 DIAGNOSIS — R0602 Shortness of breath: Principal | ICD-10-CM

## 2020-12-28 MED ORDER — ATORVASTATIN 40 MG TABLET
ORAL_TABLET | 0 refills | 0 days | Status: CP
Start: 2020-12-28 — End: ?

## 2020-12-30 ENCOUNTER — Emergency Department: Admit: 2020-12-30 | Discharge: 2020-12-31 | Disposition: A | Discharge status: Home / Self Care | Payer: MEDICARE

## 2020-12-30 ENCOUNTER — Ambulatory Visit: Admit: 2020-12-30 | Discharge: 2020-12-31 | Disposition: A | Discharge status: Home / Self Care | Payer: MEDICARE

## 2020-12-30 DIAGNOSIS — S2241XA Multiple fractures of ribs, right side, initial encounter for closed fracture: Principal | ICD-10-CM

## 2021-01-01 ENCOUNTER — Ambulatory Visit: Admit: 2021-01-01 | Discharge: 2021-01-02 | Payer: MEDICARE

## 2021-01-01 DIAGNOSIS — M8448XD Pathological fracture, other site, subsequent encounter for fracture with routine healing: Principal | ICD-10-CM

## 2021-01-01 DIAGNOSIS — Z7952 Long term (current) use of systemic steroids: Principal | ICD-10-CM

## 2021-01-01 MED ORDER — LIDOCAINE 5 % TOPICAL PATCH
MEDICATED_PATCH | 1 refills | 0 days | Status: CP
Start: 2021-01-01 — End: ?

## 2021-01-01 MED ORDER — OXYCODONE 5 MG TABLET
ORAL_TABLET | Freq: Every evening | ORAL | 0 refills | 21 days | Status: CP
Start: 2021-01-01 — End: ?

## 2021-01-01 MED ORDER — DICLOFENAC 1 % TOPICAL GEL
Freq: Four times a day (QID) | TOPICAL | 0 refills | 13 days | Status: CP
Start: 2021-01-01 — End: 2022-01-01

## 2021-01-18 ENCOUNTER — Ambulatory Visit: Admit: 2021-01-18 | Discharge: 2021-01-19 | Payer: MEDICARE

## 2021-01-18 DIAGNOSIS — G8929 Other chronic pain: Principal | ICD-10-CM

## 2021-01-18 DIAGNOSIS — M25512 Pain in left shoulder: Principal | ICD-10-CM

## 2021-01-18 DIAGNOSIS — R0609 Other forms of dyspnea: Principal | ICD-10-CM

## 2021-01-18 DIAGNOSIS — M898X2 Other specified disorders of bone, upper arm: Principal | ICD-10-CM

## 2021-01-18 DIAGNOSIS — M8448XD Pathological fracture, other site, subsequent encounter for fracture with routine healing: Principal | ICD-10-CM

## 2021-01-18 MED ORDER — OXYCODONE 5 MG TABLET
ORAL_TABLET | Freq: Every evening | ORAL | 0 refills | 21.00000 days | Status: CP
Start: 2021-01-18 — End: ?

## 2021-01-20 DIAGNOSIS — M79602 Pain in left arm: Principal | ICD-10-CM

## 2021-01-20 DIAGNOSIS — R5381 Other malaise: Principal | ICD-10-CM

## 2021-01-20 DIAGNOSIS — U099 Post-COVID syndrome: Principal | ICD-10-CM

## 2021-01-20 DIAGNOSIS — M19011 Primary osteoarthritis, right shoulder: Principal | ICD-10-CM

## 2021-01-20 DIAGNOSIS — G8929 Other chronic pain: Principal | ICD-10-CM

## 2021-01-20 DIAGNOSIS — R2681 Unsteadiness on feet: Principal | ICD-10-CM

## 2021-01-20 DIAGNOSIS — M25512 Pain in left shoulder: Principal | ICD-10-CM

## 2021-01-20 DIAGNOSIS — R0609 Other forms of dyspnea: Principal | ICD-10-CM

## 2021-01-20 DIAGNOSIS — M19012 Primary osteoarthritis, left shoulder: Principal | ICD-10-CM

## 2021-01-20 DIAGNOSIS — M898X2 Other specified disorders of bone, upper arm: Principal | ICD-10-CM

## 2021-01-22 MED ORDER — ONETOUCH VERIO TEST STRIPS
ORAL_STRIP | 0 refills | 0 days | Status: CP
Start: 2021-01-22 — End: ?

## 2021-01-24 MED ORDER — ATORVASTATIN 40 MG TABLET
ORAL_TABLET | 0 refills | 0 days | Status: CP
Start: 2021-01-24 — End: ?

## 2021-02-01 ENCOUNTER — Ambulatory Visit: Admit: 2021-02-01 | Discharge: 2021-02-02 | Payer: MEDICARE

## 2021-02-01 DIAGNOSIS — N1831 Stage 3a chronic kidney disease (CMS-HCC): Principal | ICD-10-CM

## 2021-02-04 MED ORDER — ONETOUCH VERIO TEST STRIPS
ORAL_STRIP | 0 refills | 0 days | Status: CP
Start: 2021-02-04 — End: ?

## 2021-02-09 ENCOUNTER — Ambulatory Visit: Admit: 2021-02-09 | Discharge: 2021-02-10 | Disposition: A | Discharge status: Home / Self Care | Payer: MEDICARE

## 2021-02-09 ENCOUNTER — Emergency Department: Admit: 2021-02-09 | Discharge: 2021-02-10 | Disposition: A | Discharge status: Home / Self Care | Payer: MEDICARE

## 2021-02-10 MED ORDER — OSELTAMIVIR 75 MG CAPSULE
ORAL_CAPSULE | Freq: Two times a day (BID) | ORAL | 0 refills | 10 days | Status: CP
Start: 2021-02-10 — End: 2021-02-20

## 2021-02-10 MED ORDER — AZITHROMYCIN 250 MG TABLET
ORAL_TABLET | Freq: Every day | ORAL | 0 refills | 4 days | Status: CP
Start: 2021-02-10 — End: 2021-02-14

## 2021-02-13 ENCOUNTER — Ambulatory Visit: Admit: 2021-02-13 | Discharge: 2021-02-14 | Payer: MEDICARE

## 2021-02-13 DIAGNOSIS — M8448XD Pathological fracture, other site, subsequent encounter for fracture with routine healing: Principal | ICD-10-CM

## 2021-02-13 DIAGNOSIS — J101 Influenza due to other identified influenza virus with other respiratory manifestations: Principal | ICD-10-CM

## 2021-02-13 DIAGNOSIS — M25512 Pain in left shoulder: Principal | ICD-10-CM

## 2021-02-13 DIAGNOSIS — G8929 Other chronic pain: Principal | ICD-10-CM

## 2021-02-13 DIAGNOSIS — E538 Deficiency of other specified B group vitamins: Principal | ICD-10-CM

## 2021-02-13 DIAGNOSIS — E559 Vitamin D deficiency, unspecified: Principal | ICD-10-CM

## 2021-02-13 MED ORDER — CYANOCOBALAMIN (VIT B-12) 1,000 MCG/ML INJECTION SOLUTION
SUBCUTANEOUS | 1 refills | 300 days | Status: CP
Start: 2021-02-13 — End: ?

## 2021-02-13 MED ORDER — ERGOCALCIFEROL (VITAMIN D2) 1,250 MCG (50,000 UNIT) CAPSULE
ORAL_CAPSULE | ORAL | 1 refills | 84 days | Status: CP
Start: 2021-02-13 — End: ?

## 2021-02-14 ENCOUNTER — Ambulatory Visit: Admit: 2021-02-14 | Discharge: 2021-02-15 | Payer: MEDICARE

## 2021-02-14 DIAGNOSIS — M7582 Other shoulder lesions, left shoulder: Principal | ICD-10-CM

## 2021-02-14 DIAGNOSIS — M5030 Other cervical disc degeneration, unspecified cervical region: Principal | ICD-10-CM

## 2021-02-14 DIAGNOSIS — M5412 Radiculopathy, cervical region: Principal | ICD-10-CM

## 2021-02-14 MED ORDER — HYDROCODONE 5 MG-ACETAMINOPHEN 325 MG TABLET
ORAL_TABLET | Freq: Four times a day (QID) | ORAL | 0 refills | 5 days | Status: CP | PRN
Start: 2021-02-14 — End: ?

## 2021-02-14 MED ORDER — TIZANIDINE 4 MG TABLET
ORAL_TABLET | Freq: Four times a day (QID) | ORAL | 0 refills | 8 days | Status: CP | PRN
Start: 2021-02-14 — End: ?

## 2021-02-22 ENCOUNTER — Ambulatory Visit: Admit: 2021-02-22 | Discharge: 2021-02-23 | Payer: MEDICARE

## 2021-02-22 DIAGNOSIS — I10 Essential (primary) hypertension: Principal | ICD-10-CM

## 2021-02-22 DIAGNOSIS — M8448XD Pathological fracture, other site, subsequent encounter for fracture with routine healing: Principal | ICD-10-CM

## 2021-02-22 DIAGNOSIS — Z125 Encounter for screening for malignant neoplasm of prostate: Principal | ICD-10-CM

## 2021-02-22 DIAGNOSIS — M25512 Pain in left shoulder: Principal | ICD-10-CM

## 2021-02-22 DIAGNOSIS — G8929 Other chronic pain: Principal | ICD-10-CM

## 2021-02-22 MED ORDER — ATORVASTATIN 40 MG TABLET
ORAL_TABLET | 0 refills | 0 days | Status: CP
Start: 2021-02-22 — End: ?

## 2021-02-25 ENCOUNTER — Ambulatory Visit
Admit: 2021-02-25 | Discharge: 2021-02-26 | Payer: MEDICARE | Attending: Physical Medicine & Rehabilitation | Primary: Physical Medicine & Rehabilitation

## 2021-02-25 DIAGNOSIS — M5412 Radiculopathy, cervical region: Principal | ICD-10-CM

## 2021-02-25 DIAGNOSIS — M25512 Pain in left shoulder: Principal | ICD-10-CM

## 2021-02-25 DIAGNOSIS — U099 Long COVID: Principal | ICD-10-CM

## 2021-02-25 DIAGNOSIS — G8929 Other chronic pain: Principal | ICD-10-CM

## 2021-02-26 ENCOUNTER — Ambulatory Visit: Admit: 2021-02-26 | Discharge: 2021-02-27 | Disposition: A | Discharge status: Home / Self Care | Payer: MEDICARE

## 2021-02-26 ENCOUNTER — Emergency Department: Admit: 2021-02-26 | Discharge: 2021-02-27 | Disposition: A | Discharge status: Home / Self Care | Payer: MEDICARE

## 2021-02-26 DIAGNOSIS — I4891 Unspecified atrial fibrillation: Principal | ICD-10-CM

## 2021-02-26 MED ORDER — DILTIAZEM 30 MG TABLET
ORAL_TABLET | 0 refills | 0 days | Status: CP
Start: 2021-02-26 — End: ?

## 2021-03-05 ENCOUNTER — Ambulatory Visit: Admit: 2021-03-05 | Discharge: 2021-03-06 | Payer: MEDICARE | Attending: Internal Medicine | Primary: Internal Medicine

## 2021-03-05 DIAGNOSIS — I480 Paroxysmal atrial fibrillation: Principal | ICD-10-CM

## 2021-03-05 DIAGNOSIS — R079 Chest pain, unspecified: Principal | ICD-10-CM

## 2021-03-05 DIAGNOSIS — I4892 Unspecified atrial flutter: Principal | ICD-10-CM

## 2021-03-05 DIAGNOSIS — R0602 Shortness of breath: Principal | ICD-10-CM

## 2021-03-05 DIAGNOSIS — E785 Hyperlipidemia, unspecified: Principal | ICD-10-CM

## 2021-03-05 DIAGNOSIS — I240 Acute coronary thrombosis not resulting in myocardial infarction: Principal | ICD-10-CM

## 2021-03-05 DIAGNOSIS — I10 Essential (primary) hypertension: Principal | ICD-10-CM

## 2021-03-05 DIAGNOSIS — I259 Chronic ischemic heart disease, unspecified: Principal | ICD-10-CM

## 2021-03-06 DIAGNOSIS — I259 Chronic ischemic heart disease, unspecified: Principal | ICD-10-CM

## 2021-03-06 MED ORDER — ATORVASTATIN 10 MG TABLET
ORAL_TABLET | Freq: Every day | ORAL | 3 refills | 30 days | Status: CP
Start: 2021-03-06 — End: 2022-03-06

## 2021-03-11 DIAGNOSIS — G4700 Insomnia, unspecified: Principal | ICD-10-CM

## 2021-03-11 MED ORDER — TRAZODONE 50 MG TABLET
ORAL_TABLET | 0 refills | 0 days | Status: CP
Start: 2021-03-11 — End: ?

## 2021-03-13 DIAGNOSIS — K219 Gastro-esophageal reflux disease without esophagitis: Principal | ICD-10-CM

## 2021-03-14 ENCOUNTER — Ambulatory Visit: Admit: 2021-03-14 | Discharge: 2021-03-15 | Payer: MEDICARE

## 2021-03-14 DIAGNOSIS — H53483 Generalized contraction of visual field, bilateral: Principal | ICD-10-CM

## 2021-03-14 DIAGNOSIS — H543 Unqualified visual loss, both eyes: Principal | ICD-10-CM

## 2021-03-14 MED ORDER — APIXABAN 5 MG TABLET
ORAL_TABLET | Freq: Two times a day (BID) | ORAL | 1 refills | 90 days | Status: CP
Start: 2021-03-14 — End: 2022-03-14

## 2021-03-14 MED ORDER — OMEPRAZOLE 20 MG CAPSULE,DELAYED RELEASE
ORAL_CAPSULE | Freq: Two times a day (BID) | ORAL | 1 refills | 90 days | Status: CP
Start: 2021-03-14 — End: 2022-03-14

## 2021-03-25 DIAGNOSIS — M79671 Pain in right foot: Principal | ICD-10-CM

## 2021-03-25 DIAGNOSIS — M79672 Pain in left foot: Principal | ICD-10-CM

## 2021-03-25 DIAGNOSIS — M25512 Pain in left shoulder: Principal | ICD-10-CM

## 2021-03-25 DIAGNOSIS — G8929 Other chronic pain: Principal | ICD-10-CM

## 2021-03-25 DIAGNOSIS — M79621 Pain in right upper arm: Principal | ICD-10-CM

## 2021-03-25 DIAGNOSIS — M898X2 Other specified disorders of bone, upper arm: Principal | ICD-10-CM

## 2021-03-25 MED ORDER — TIZANIDINE 4 MG TABLET
ORAL_TABLET | Freq: Four times a day (QID) | ORAL | 0 refills | 8 days | Status: CP | PRN
Start: 2021-03-25 — End: ?

## 2021-03-25 MED ORDER — PREGABALIN 50 MG CAPSULE
ORAL_CAPSULE | Freq: Two times a day (BID) | ORAL | 2 refills | 90 days | Status: CP
Start: 2021-03-25 — End: 2022-03-25

## 2021-04-03 ENCOUNTER — Other Ambulatory Visit: Admit: 2021-04-03 | Discharge: 2021-04-04 | Payer: MEDICARE

## 2021-04-04 ENCOUNTER — Ambulatory Visit: Admit: 2021-04-04 | Discharge: 2021-04-05 | Payer: MEDICARE | Attending: Internal Medicine | Primary: Internal Medicine

## 2021-04-04 DIAGNOSIS — R079 Chest pain, unspecified: Principal | ICD-10-CM

## 2021-04-04 DIAGNOSIS — I10 Essential (primary) hypertension: Principal | ICD-10-CM

## 2021-04-04 DIAGNOSIS — E785 Hyperlipidemia, unspecified: Principal | ICD-10-CM

## 2021-04-04 DIAGNOSIS — I480 Paroxysmal atrial fibrillation: Principal | ICD-10-CM

## 2021-04-04 DIAGNOSIS — I240 Acute coronary thrombosis not resulting in myocardial infarction: Principal | ICD-10-CM

## 2021-04-04 DIAGNOSIS — I259 Chronic ischemic heart disease, unspecified: Principal | ICD-10-CM

## 2021-04-04 DIAGNOSIS — R0602 Shortness of breath: Principal | ICD-10-CM

## 2021-04-04 MED ORDER — ISOSORBIDE MONONITRATE ER 30 MG TABLET,EXTENDED RELEASE 24 HR
ORAL_TABLET | Freq: Every day | ORAL | 11 refills | 30.00000 days | Status: CP
Start: 2021-04-04 — End: 2022-04-04

## 2021-04-05 ENCOUNTER — Ambulatory Visit: Admit: 2021-04-05 | Discharge: 2021-04-06 | Payer: MEDICARE

## 2021-04-05 DIAGNOSIS — G8929 Other chronic pain: Principal | ICD-10-CM

## 2021-04-05 DIAGNOSIS — I10 Essential (primary) hypertension: Principal | ICD-10-CM

## 2021-04-05 DIAGNOSIS — M8448XD Pathological fracture, other site, subsequent encounter for fracture with routine healing: Principal | ICD-10-CM

## 2021-04-05 DIAGNOSIS — E119 Type 2 diabetes mellitus without complications: Principal | ICD-10-CM

## 2021-04-05 DIAGNOSIS — I259 Chronic ischemic heart disease, unspecified: Principal | ICD-10-CM

## 2021-04-05 DIAGNOSIS — M50121 Cervical disc disorder at C4-C5 level with radiculopathy: Principal | ICD-10-CM

## 2021-04-05 DIAGNOSIS — M25512 Pain in left shoulder: Principal | ICD-10-CM

## 2021-04-05 DIAGNOSIS — I200 Unstable angina: Principal | ICD-10-CM

## 2021-04-06 ENCOUNTER — Ambulatory Visit: Admit: 2021-04-06 | Discharge: 2021-04-06 | Payer: MEDICARE

## 2021-04-09 DIAGNOSIS — N1831 Stage 3a chronic kidney disease (CMS-HCC): Principal | ICD-10-CM

## 2021-04-10 ENCOUNTER — Ambulatory Visit: Admit: 2021-04-10 | Discharge: 2021-04-11 | Payer: MEDICARE | Attending: Internal Medicine | Primary: Internal Medicine

## 2021-04-10 DIAGNOSIS — I480 Paroxysmal atrial fibrillation: Principal | ICD-10-CM

## 2021-04-10 DIAGNOSIS — I209 Angina pectoris, unspecified: Principal | ICD-10-CM

## 2021-04-10 DIAGNOSIS — I240 Acute coronary thrombosis not resulting in myocardial infarction: Principal | ICD-10-CM

## 2021-04-10 DIAGNOSIS — I10 Essential (primary) hypertension: Principal | ICD-10-CM

## 2021-04-10 DIAGNOSIS — R0602 Shortness of breath: Principal | ICD-10-CM

## 2021-04-10 DIAGNOSIS — I259 Chronic ischemic heart disease, unspecified: Principal | ICD-10-CM

## 2021-04-10 DIAGNOSIS — R1310 Dysphagia, unspecified: Principal | ICD-10-CM

## 2021-04-10 MED ORDER — OMEPRAZOLE 10 MG CAPSULE,DELAYED RELEASE
ORAL_CAPSULE | 3 refills | 0 days | Status: CP
Start: 2021-04-10 — End: ?

## 2021-04-16 ENCOUNTER — Ambulatory Visit: Admit: 2021-04-16 | Discharge: 2021-04-17 | Payer: MEDICARE

## 2021-04-18 ENCOUNTER — Ambulatory Visit: Admit: 2021-04-18 | Discharge: 2021-04-19 | Payer: MEDICARE | Attending: Internal Medicine | Primary: Internal Medicine

## 2021-04-18 DIAGNOSIS — I480 Paroxysmal atrial fibrillation: Principal | ICD-10-CM

## 2021-04-18 DIAGNOSIS — E785 Hyperlipidemia, unspecified: Principal | ICD-10-CM

## 2021-04-18 DIAGNOSIS — I10 Essential (primary) hypertension: Principal | ICD-10-CM

## 2021-04-18 DIAGNOSIS — I240 Acute coronary thrombosis not resulting in myocardial infarction: Principal | ICD-10-CM

## 2021-04-18 DIAGNOSIS — I259 Chronic ischemic heart disease, unspecified: Principal | ICD-10-CM

## 2021-04-18 DIAGNOSIS — R0602 Shortness of breath: Principal | ICD-10-CM

## 2021-04-18 DIAGNOSIS — I209 Angina pectoris, unspecified: Principal | ICD-10-CM

## 2021-04-19 DIAGNOSIS — I10 Essential (primary) hypertension: Principal | ICD-10-CM

## 2021-04-19 DIAGNOSIS — R0602 Shortness of breath: Principal | ICD-10-CM

## 2021-04-19 DIAGNOSIS — I209 Angina pectoris, unspecified: Principal | ICD-10-CM

## 2021-04-23 MED ORDER — ONETOUCH VERIO TEST STRIPS
ORAL_STRIP | 1 refills | 0 days | Status: CP
Start: 2021-04-23 — End: ?

## 2021-04-30 ENCOUNTER — Ambulatory Visit
Admit: 2021-04-30 | Discharge: 2021-05-01 | Payer: MEDICARE | Attending: Physical Medicine & Rehabilitation | Primary: Physical Medicine & Rehabilitation

## 2021-05-03 ENCOUNTER — Ambulatory Visit: Admit: 2021-05-03 | Discharge: 2021-05-03 | Payer: MEDICARE

## 2021-05-10 ENCOUNTER — Ambulatory Visit: Admit: 2021-05-10 | Discharge: 2021-05-11 | Payer: MEDICARE | Attending: Internal Medicine | Primary: Internal Medicine

## 2021-05-10 MED ORDER — ISOSORBIDE MONONITRATE ER 60 MG TABLET,EXTENDED RELEASE 24 HR
ORAL_TABLET | Freq: Every day | ORAL | 11 refills | 30.00000 days | Status: CP
Start: 2021-05-10 — End: 2022-05-10

## 2021-05-10 MED ORDER — ROSUVASTATIN 20 MG TABLET
ORAL_TABLET | Freq: Every day | ORAL | 11 refills | 30 days | Status: CP
Start: 2021-05-10 — End: 2022-05-10

## 2021-05-13 MED ORDER — METOPROLOL SUCCINATE ER 100 MG TABLET,EXTENDED RELEASE 24 HR
ORAL_TABLET | 1 refills | 0 days | Status: CP
Start: 2021-05-13 — End: ?

## 2021-05-14 DIAGNOSIS — G8929 Other chronic pain: Principal | ICD-10-CM

## 2021-05-14 DIAGNOSIS — E119 Type 2 diabetes mellitus without complications: Principal | ICD-10-CM

## 2021-05-14 DIAGNOSIS — M25512 Pain in left shoulder: Principal | ICD-10-CM

## 2021-05-14 MED ORDER — METFORMIN ER 500 MG TABLET,EXTENDED RELEASE 24 HR
ORAL_TABLET | Freq: Every day | ORAL | 5 refills | 30 days | Status: CP
Start: 2021-05-14 — End: 2022-05-14

## 2021-05-14 MED ORDER — TIZANIDINE 4 MG TABLET
ORAL_TABLET | Freq: Four times a day (QID) | ORAL | 0 refills | 8 days | Status: CP | PRN
Start: 2021-05-14 — End: ?

## 2021-05-17 ENCOUNTER — Ambulatory Visit
Admit: 2021-05-17 | Discharge: 2021-05-17 | Payer: MEDICARE | Attending: Physical Medicine & Rehabilitation | Primary: Physical Medicine & Rehabilitation

## 2021-05-17 DIAGNOSIS — U099 Long COVID: Principal | ICD-10-CM

## 2021-05-17 DIAGNOSIS — M79672 Pain in left foot: Principal | ICD-10-CM

## 2021-05-17 DIAGNOSIS — M898X2 Other specified disorders of bone, upper arm: Principal | ICD-10-CM

## 2021-05-17 DIAGNOSIS — M79621 Pain in right upper arm: Principal | ICD-10-CM

## 2021-05-17 DIAGNOSIS — M25512 Pain in left shoulder: Principal | ICD-10-CM

## 2021-05-17 DIAGNOSIS — G8929 Other chronic pain: Principal | ICD-10-CM

## 2021-05-17 DIAGNOSIS — M79671 Pain in right foot: Principal | ICD-10-CM

## 2021-05-17 DIAGNOSIS — M792 Neuralgia and neuritis, unspecified: Principal | ICD-10-CM

## 2021-05-17 MED ORDER — PREGABALIN 50 MG CAPSULE
ORAL_CAPSULE | Freq: Two times a day (BID) | ORAL | 5 refills | 30 days | Status: CP
Start: 2021-05-17 — End: 2021-11-13

## 2021-05-20 DIAGNOSIS — I701 Atherosclerosis of renal artery: Principal | ICD-10-CM

## 2021-05-28 ENCOUNTER — Ambulatory Visit
Admit: 2021-05-28 | Payer: MEDICARE | Attending: Rehabilitative and Restorative Service Providers" | Primary: Rehabilitative and Restorative Service Providers"

## 2021-06-03 ENCOUNTER — Ambulatory Visit: Admit: 2021-06-03 | Discharge: 2021-06-03 | Payer: MEDICARE

## 2021-06-03 MED ORDER — ISOSORBIDE MONONITRATE ER 60 MG TABLET,EXTENDED RELEASE 24 HR
ORAL_TABLET | Freq: Every day | ORAL | 11 refills | 30 days | Status: CP
Start: 2021-06-03 — End: 2022-06-03

## 2021-06-03 MED ORDER — CLOPIDOGREL 75 MG TABLET
ORAL_TABLET | Freq: Every day | ORAL | 11 refills | 30 days | Status: CP
Start: 2021-06-03 — End: ?

## 2021-07-04 ENCOUNTER — Ambulatory Visit: Admit: 2021-07-04 | Discharge: 2021-07-05 | Payer: MEDICARE | Attending: Family Medicine | Primary: Family Medicine

## 2021-07-04 DIAGNOSIS — I2510 Atherosclerotic heart disease of native coronary artery without angina pectoris: Principal | ICD-10-CM

## 2021-07-04 DIAGNOSIS — I259 Chronic ischemic heart disease, unspecified: Principal | ICD-10-CM

## 2021-07-04 DIAGNOSIS — R0602 Shortness of breath: Principal | ICD-10-CM

## 2021-07-04 DIAGNOSIS — R0609 Other forms of dyspnea: Principal | ICD-10-CM

## 2021-07-04 DIAGNOSIS — E119 Type 2 diabetes mellitus without complications: Principal | ICD-10-CM

## 2021-07-04 DIAGNOSIS — G4700 Insomnia, unspecified: Principal | ICD-10-CM

## 2021-07-04 DIAGNOSIS — I959 Hypotension, unspecified: Principal | ICD-10-CM

## 2021-07-04 DIAGNOSIS — Z8616 History of 2019 novel coronavirus disease (COVID-19): Principal | ICD-10-CM

## 2021-07-04 MED ORDER — ISOSORBIDE MONONITRATE ER 60 MG TABLET,EXTENDED RELEASE 24 HR
ORAL_TABLET | Freq: Every day | ORAL | 11 refills | 30 days | Status: CP
Start: 2021-07-04 — End: 2022-07-04

## 2021-07-04 MED ORDER — GLIPIZIDE 5 MG TABLET
ORAL_TABLET | Freq: Every day | ORAL | 1 refills | 30 days | Status: CP
Start: 2021-07-04 — End: ?

## 2021-07-04 MED ORDER — TRAZODONE 50 MG TABLET
ORAL_TABLET | Freq: Every evening | ORAL | 0 refills | 90 days | Status: CP
Start: 2021-07-04 — End: ?

## 2021-07-04 MED ORDER — NITROGLYCERIN 0.4 MG SUBLINGUAL TABLET
ORAL_TABLET | SUBLINGUAL | 3 refills | 1 days | Status: CP | PRN
Start: 2021-07-04 — End: 2022-07-04

## 2021-07-05 ENCOUNTER — Ambulatory Visit: Admit: 2021-07-05 | Discharge: 2021-07-06 | Payer: MEDICARE | Attending: Internal Medicine | Primary: Internal Medicine

## 2021-07-05 DIAGNOSIS — I240 Acute coronary thrombosis not resulting in myocardial infarction: Principal | ICD-10-CM

## 2021-07-05 DIAGNOSIS — I259 Chronic ischemic heart disease, unspecified: Principal | ICD-10-CM

## 2021-07-05 DIAGNOSIS — E1169 Type 2 diabetes mellitus with other specified complication: Principal | ICD-10-CM

## 2021-07-05 DIAGNOSIS — Z789 Other specified health status: Principal | ICD-10-CM

## 2021-07-05 DIAGNOSIS — E1159 Type 2 diabetes mellitus with other circulatory complications: Principal | ICD-10-CM

## 2021-07-05 DIAGNOSIS — E785 Hyperlipidemia, unspecified: Principal | ICD-10-CM

## 2021-07-05 DIAGNOSIS — I771 Stricture of artery: Principal | ICD-10-CM

## 2021-07-05 DIAGNOSIS — E119 Type 2 diabetes mellitus without complications: Principal | ICD-10-CM

## 2021-07-05 DIAGNOSIS — I152 Hypertension secondary to endocrine disorders: Principal | ICD-10-CM

## 2021-07-05 DIAGNOSIS — I208 Other forms of angina pectoris: Principal | ICD-10-CM

## 2021-07-05 DIAGNOSIS — I701 Atherosclerosis of renal artery: Principal | ICD-10-CM

## 2021-07-05 MED ORDER — REPATHA SURECLICK 140 MG/ML SUBCUTANEOUS PEN INJECTOR
SUBCUTANEOUS | 11 refills | 0 days | Status: CP
Start: 2021-07-05 — End: ?
  Filled 2021-07-16: qty 2, 28d supply, fill #0

## 2021-07-10 ENCOUNTER — Ambulatory Visit
Admit: 2021-07-10 | Discharge: 2021-07-11 | Payer: MEDICARE | Attending: Physical Medicine & Rehabilitation | Primary: Physical Medicine & Rehabilitation

## 2021-07-10 DIAGNOSIS — R1310 Dysphagia, unspecified: Principal | ICD-10-CM

## 2021-07-10 DIAGNOSIS — G5621 Lesion of ulnar nerve, right upper limb: Principal | ICD-10-CM

## 2021-07-10 MED ORDER — OMEPRAZOLE 10 MG CAPSULE,DELAYED RELEASE
ORAL_CAPSULE | 1 refills | 0 days | Status: CP
Start: 2021-07-10 — End: ?

## 2021-07-11 NOTE — Unmapped (Signed)
[facility] Specialty Medication Onboarding    Specialty Medication: REPATHA SURECLICK 140 mg/mL Pnij (evolocumab)  Prior Authorization: Approved   Financial Assistance: Yes - grant approved as secondary   Final Copay/Day Supply: $0 / 28    Insurance Restrictions: None     Notes to Pharmacist: n/a    The triage team has completed the benefits investigation and has determined that the patient is able to fill this medication at [facility]. Please contact the patient to complete the onboarding or follow up with the prescribing physician as needed.

## 2021-07-12 NOTE — Unmapped (Signed)
Labs are overall OK.  Continue current medications and recommendations.  Keep planned follow-up appointment.

## 2021-07-12 NOTE — Unmapped (Signed)
-----   Message from [name], MD sent at 07/12/2021 12:03 PM EDT -----  Labs are overall OK.  Continue current medications and recommendations.  Keep planned follow-up appointment.

## 2021-07-12 NOTE — Unmapped (Signed)
[facility]   Patient Onboarding/Medication Counseling    Mr.Blumenberg is a 76 y.o. male with CAD who I am counseling today on initiation of therapy.  I am speaking to the patient's family member, wife.    Was a Nurse, learning disability used for this call? No    Verified patient's date of birth / HIPAA.    Specialty medication(s) to be sent: General Specialty: Repatha      Non-specialty medications/supplies to be sent: Higher education careers adviser      Medications not needed at this time: None         Repatha (evolocumab)    The patient declined counseling on medication administration, missed dose instructions, goals of therapy, side effects and monitoring parameters, warnings and precautions, drug/food interactions and storage, handling precautions, and disposal because they were counseled in clinic. The information in the declined sections below are for informational purposes only and was not discussed with patient.       Medication & Administration     Dosage: Inject the contents of 1 pen (140mg ) under the skin every 2 weeks.    Administration: Administer under the skin of the abdomen, thigh or upper arm. Rotate sites with each injection.  ??? Injection instructions - Autoinjector:  o Remove 1 Repatha autoinjector from the refrigerator and let stand at room temperature for at least 30 minutes.  o Check the autoinjector for the following:  - Expiration date  - Absence of any cracks or damage  - The medicine is clear and colorless and does not contain any particles  - The orange cap is present and securely attached  o Choose your injection site and clean with an alcohol wipe. Allow to air dry completely.  o Pull the orange cap straight off and discard  o Pinch the skin (or stretch) with your thumb and fingers creating an area 2 inches wide  o Maintaining the pinch (or stretch) press the pen to your skin at a 90 degree angle. Firmly push the autoinjector down until the skin stops moving and the yellow safety guard is no longer visible.  o Do not touch the gray start button yet  o When you are ready to inject, press the gray start button. You will hear a click that signals the start of the injection  o Continue to press the pen to your skin and lift your thumb  o The injection may take up to 15 seconds. You will know the injection is complete when the medication window turns yellow. You may also hear a second click.  o Remove the pen from your skin and discard the pen in a sharps container.  o If there is blood at the injection site, press a cotton ball or gauze to the site. Do not rub the injection site.    Adherence/Missed dose instructions: Administer a missed dose within 7 days and resume your normal schedule.  If it has been more than 7 days and you inject every 2 weeks, skip the missed dose and resume your normal schedule..     Goals of Therapy     Lower cholesterol, prevention of cardiovascular events in patients with established cardiovascular disease    Side Effects & Monitoring Parameters   ??? Flu-like symptoms  ??? Signs of a common cold  ??? Back pain  ??? Injection site irritation  ??? Nose or throat irritation    The following side effects should be reported to the provider:  ??? Signs of an  allergic reaction  ??? Signs of high blood sugar (confusion, drowsiness, increase thirst/hunger/urination, fast breathing, flushing)      Contraindications, Warnings, & Precautions     ??? Latex (the packaging of Repatha may contain natural rubber)    Drug/Food Interactions     ??? Medication list reviewed in Epic. The patient was instructed to inform the care team before taking any new medications or supplements. No drug interactions identified.     Storage, Handling Precautions, & Disposal   ??? Repatha should be stored in the refrigerator. If necessary, Repatha may be kept at room temperature for no more than 30 days.  ??? Place used devices in a sharps container for disposal.      Current Medications (including OTC/herbals), Comorbidities and Allergies     Current Outpatient Medications   Medication Sig Dispense Refill   ??? acetaminophen (TYLENOL) 325 MG tablet Take 2 tablets (650 mg total) by mouth every six (6) hours as needed for pain.  0   ??? albuterol HFA 90 mcg/actuation inhaler Inhale 2 puffs every six (6) hours as needed for wheezing or shortness of breath. (Patient not taking: Reported on 05/17/2021) 18 g 0   ??? apixaban (ELIQUIS) 5 mg Tab Take 1 tablet (5 mg total) by mouth Two (2) times a day. 180 tablet 1   ??? blood-glucose meter kit Check BS every day 1 kit 0   ??? clopidogreL (PLAVIX) 75 mg tablet Take 1 tablet (75 mg total) by mouth daily. 30 tablet 11   ??? cyanocobalamin, vitamin B-12, 1,000 mcg/mL injection Inject 1 mL (1,000 mcg total) under the skin every thirty (30) days. 10 mL 1   ??? diclofenac sodium (VOLTAREN) 1 % gel Apply 2 g topically four (4) times a day. 100 g 0   ??? dilTIAZem (CARDIZEM) 30 MG tablet 1 tab Q3 hr PRN bad palpitations, HR>110.  Be sure top number blood pressure over 95. (Patient not taking: Reported on 07/10/2021) 30 tablet 0   ??? empty container (SHARPS-A-GATOR DISPOSAL SYSTEM) Misc Use as directed for sharps disposal 1 each 2   ??? ergocalciferol-1,250 mcg, 50,000 unit, (DRISDOL) 1,250 mcg (50,000 unit) capsule Take 1 capsule (1,250 mcg total) by mouth once a week. 12 capsule 1   ??? evolocumab (REPATHA SURECLICK) 140 mg/mL PnIj Inject the contents of one pen (140 mg) under the skin every fourteen (14) days. (Patient not taking: Reported on 07/10/2021) 2 mL 11   ??? fluticasone propionate (FLONASE) 50 mcg/actuation nasal spray 2 sprays into each nostril in the morning. 16 g 5   ??? glipiZIDE (GLUCOTROL) 5 MG tablet Take 1 tablet (5 mg total) by mouth in the morning. For diabetes.. 30 tablet 1   ??? isosorbide mononitrate (IMDUR) 60 MG 24 hr tablet Take 1 tablet (60 mg total) by mouth daily. 30 tablet 11   ??? metoprolol succinate (TOPROL-XL) 100 MG 24 hr tablet TAKE 2 TABLETS BY MOUTH EVERY DAY 180 tablet 1   ??? miscellaneous medical supply Misc Glucometer  Dx: E11.9 1 each 0   ??? miscellaneous medical supply Misc Glucometer test strips   Dx: E11.9 50 each 5   ??? miscellaneous medical supply Misc Lancets  Dx  E11.9 100 each 1   ??? nitroglycerin (NITROLINGUAL) 0.4 mg/dose spray Place 1 spray under the tongue every five (5) minutes as needed for chest pain. (Patient not taking: Reported on 07/10/2021)     ??? nitroglycerin (NITROSTAT) 0.4 MG SL tablet Place 1 tablet (0.4 mg total) under the  tongue every five (5) minutes as needed for chest pain. Maximum of 3 doses in 15 minutes. 100 tablet 3   ??? omeprazole (PRILOSEC) 10 MG capsule TAKE 1 CAPSULE BY MOUTH EVERY DAY PLUS 20 MG TO EQUAL 30 MG FOR TAPERING OFF. 90 capsule 1   ??? omeprazole (PRILOSEC) 20 MG capsule Take 1 capsule (20 mg total) by mouth Two (2) times a day. 180 capsule 1   ??? ONETOUCH VERIO TEST STRIPS Strp USE TO CHECK BLOOD SUGAR 1-2 TIMES PER DAY 100 strip 1   ??? pregabalin (LYRICA) 50 MG capsule Take 1 capsule (50 mg total) by mouth Two (2) times a day. 60 capsule 5   ??? tiZANidine (ZANAFLEX) 4 MG tablet Take 1 tablet (4 mg total) by mouth every six (6) hours as needed. 30 tablet 0   ??? traZODone (DESYREL) 50 MG tablet Take 1 tablet (50 mg total) by mouth nightly. 90 tablet 0     No current facility-administered medications for this visit.       Allergies   Allergen Reactions   ??? Escitalopram Oxalate Hives, Itching and Nausea And Vomiting   ??? Ace Inhibitors        Patient Active Problem List   Diagnosis   ??? HTN (hypertension)   ??? Hyperlipidemia with target LDL less than 70   ??? B12 deficiency   ??? Fatigue   ??? Depression   ??? 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 Chronic insomnia   ??? Hallucinations, visual   ??? Somnolence, daytime   ??? Knee pain   ??? Ischemic heart disease due to coronary artery obstruction (CMS-HCC)   ??? Irritable colon   ??? Dysphagia   ??? Weight loss   ??? GERD (gastroesophageal reflux disease)   ??? Refused influenza vaccine   ??? Xerostomia   ??? Raynaud phenomenon   ??? Influenza vaccine refused   ??? Prostate cancer screening   ??? Colon cancer screening   ??? TMJ tenderness   ??? Nocturia more than twice per night   ??? Peripheral neuropathic pain   ??? History of coronary artery bypass surgery   ??? Stage 3a chronic kidney disease (CMS-HCC)   ??? Chest pain   ??? H/O atrial flutter   ??? Generalized headaches   ??? Typical atrial flutter (CMS-HCC), s/p catheter ablation   ??? Atrial tachycardia (CMS-HCC)   ??? Elevated troponin   ??? Chronic kidney disease   ??? Acute diastolic heart failure (CMS-HCC)   ??? Tachycardia   ??? Chronic midline low back pain with left-sided sciatica   ??? Healthcare maintenance   ??? Tick bites   ??? Chronic neck pain   ??? Type 2 diabetes mellitus without complication, without long-term current use of insulin (CMS-HCC)   ??? Vitamin D deficiency   ??? Chronic pain of left upper extremity   ??? Acute hypoxemic respiratory failure (CMS-HCC)   ??? Multiple subsegmental pulmonary emboli without acute cor pulmonale (CMS-HCC)   ??? Splenic infarct   ??? Age-related physical debility   ??? Ischemic cerebrovascular accident (CVA) (CMS-HCC)   ??? Carotid sinus syncope   ??? CAD (coronary artery disease)   ??? Chronic post-operative pain   ??? Anxiety and depression   ??? S/P CABG x 3   ??? IBS (irritable bowel syndrome)   ??? Intractable neuropathic pain of knee   ??? Shortness of breath   ??? Renal artery stenosis (CMS-HCC)       Reviewed and up to date  in Epic.    Appropriateness of Therapy     Acute infections noted within Epic:  No active infections  Patient reported infection: None    Is medication and dose appropriate based on diagnosis and infection status? Yes    Prescription has been clinically reviewed: Yes      Baseline Quality of Life Assessment      How many days over the past month did your CAD  keep you from your normal activities? For example, brushing your teeth or getting up in the morning. Patient declined to answer    Financial Information     Medication Assistance provided: Prior Authorization and [employer] Assistance    Anticipated copay of $0/28 days reviewed with patient. Verified delivery address.    Delivery Information     Scheduled delivery date: 07/16/21    Expected start date: Pt is to have appointment with provider office for injection teaching    Medication will be delivered via Same Day Courier to the prescription address in [facility].  This shipment will not require a signature.      Explained the services we provide at [facility] and that each month we would call to set up refills.  Stressed importance of returning phone calls so that we could ensure they receive their medications in time each month.  Informed patient that we should be setting up refills 7-10 days prior to when they will run out of medication.  A pharmacist will reach out to perform a clinical assessment periodically.  Informed patient that a welcome packet, containing information about our pharmacy and other support services, a Notice of Privacy Practices, and a drug information handout will be sent.      The patient or caregiver noted above participated in the development of this care plan and knows that they can request review of or adjustments to the care plan at any time.      Patient or caregiver verbalized understanding of the above information as well as how to contact the pharmacy at [phone] option 4 with any questions/concerns.  The pharmacy is open Monday through Friday 8:30am-4:30pm.  A pharmacist is available 24/7 via pager to answer any clinical questions they may have.    Patient Specific Needs     - Does the patient have any physical, cognitive, or cultural barriers? No    - Does the patient have adequate living arrangements? (i.e. the ability to store and take their medication appropriately) Yes    - Did you identify any home environmental safety or security hazards? No    - Patient prefers to have medications discussed with  Patient     - Is the patient or caregiver able to read and understand education materials at a high school level or above? Yes    - Patient's primary language is  English     - Is the patient high risk? No    SOCIAL DETERMINANTS OF HEALTH     At the [facility], we have learned that life circumstances - like trouble affording food, housing, utilities, or transportation can affect the health of many of our patients.   That is why we wanted to ask: are you currently experiencing any life circumstances that are negatively impacting your health and/or quality of life? Patient declined to answer    Social Determinants of Health     Financial Resource Strain: Not on file   Internet Connectivity: Not on file   Food Insecurity: Not  on file   Tobacco Use: Medium Risk   ??? Smoking Tobacco Use: Former   ??? Smokeless Tobacco Use: Never   ??? Passive Exposure: Not on file   Housing/Utilities: Not on file   Alcohol Use: Not At Risk   ??? How often do you have a drink containing alcohol?: Never   ??? How many drinks containing alcohol do you have on a typical day when you are drinking?: 1 - 2   ??? How often do you have 5 or more drinks on one occasion?: Never   Transportation Needs: No Transportation Needs   ??? Lack of Transportation (Medical): No   ??? Lack of Transportation (Non-Medical): No   Substance Use: Not on file   Health Literacy: Not on file   Physical Activity: Not on file   Interpersonal Safety: Not on file   Stress: Not on file   Intimate Partner Violence: Not on file   Depression: Not on file   Social Connections: Not on file       Would you be willing to receive help with any of the needs that you have identified today? Not applicable       Camillo Flaming  [facility] Specialty Pharmacist

## 2021-07-15 MED ORDER — EMPTY CONTAINER
2 refills | 0 days
Start: 2021-07-15 — End: ?

## 2021-07-15 NOTE — Unmapped (Signed)
Spoke with patient about note. Patient verbalized understadning

## 2021-07-16 ENCOUNTER — Ambulatory Visit
Admit: 2021-07-16 | Payer: MEDICARE | Attending: Rehabilitative and Restorative Service Providers" | Primary: Rehabilitative and Restorative Service Providers"

## 2021-07-16 MED FILL — EMPTY CONTAINER: 120 days supply | Qty: 1 | Fill #0

## 2021-07-17 NOTE — Unmapped (Signed)
Spoke to wife will schedule patient to come in Friday morning for training of how to give Repatha.

## 2021-07-17 NOTE — Unmapped (Signed)
-----   Message from Anette Riedel sent at 07/17/2021  2:17 PM EDT -----  Repatha has been received. When do they need to come to be trained? Please call wife

## 2021-07-19 ENCOUNTER — Institutional Professional Consult (permissible substitution): Admit: 2021-07-19 | Discharge: 2021-07-20 | Payer: MEDICARE

## 2021-07-19 NOTE — Unmapped (Signed)
Presented today to initate Repatha injection. Wife and patient watched video and feel comfortable giving injection. Patient instructed to give in left thigh preformed procedure without any issues. To receive next injection at home and in 2 weeks.

## 2021-07-24 ENCOUNTER — Ambulatory Visit
Admit: 2021-07-24 | Discharge: 2021-07-25 | Payer: MEDICARE | Attending: Plastic and Reconstructive Surgery | Primary: Plastic and Reconstructive Surgery

## 2021-07-24 NOTE — Unmapped (Signed)
Attempt to schedule Pre-Procedure Appointment- patient is driving so I will call once he get home.

## 2021-07-24 NOTE — Unmapped (Signed)
PRE-OPERATIVE INFORMATION       Osborn Coho, MD  [facility]  Clinical Support: Arlyss Repress [phone]  (medical issues)  Surgery Scheduler: Vivien Rota [phone]  Financial Care Counselor: Dimas Aguas [phone]        Surgery Date    If a surgery date is not provided to you today, please contact our surgery scheduler Vivien Rota at [phone]. If you need to cancel your surgery for any reason please call the scheduling office asap to due so.      Surgical Time  Surgical times are given out from 2:00pm-5:00pm on the last business day before your scheduled surgery. A preoperative nurse will call you during this time to confirm when you should arrive for your surgery. If you have not been called by 5:00pm please contact [phone].      The Night Before Your Surgery     DO NOT EAT OR DRINK ANYTHING AFTER MIDNIGHT, unless instructed otherwise. If you are diabetic, check with your primary care physician for special instructions concerning your insulin dosage prior to surgery. Take your usual medications as prescribed, unless otherwise instructed.         The Morning of Your Surgery    You should wear loose fitting clothes that will not be restrictive to your surgical site. Please leave jewelry, credit cards, cash, and other valuables at home. Do not wear any piercings, nail polish, make-up, or metal hair clips the day before surgery. Contact lenses, glasses, and dentures must be removed before surgery. Please make sure to bring a case to store these items in during surgery.     You may take your usual medications as prescribed with a small sip of water, unless otherwise instructed. Do NOT drink a full glass of water.     A RESPONSIBLE ADULT (OVER 18) MUST ACCOMPANY YOU ON THE DAY OF SURGERY AND BE AVAILABLE THROUGHOUT YOUR PROCEDURE IN THE EVENT THERE ARE QUESTIONS OR COMPLICATION. THEY ALSO MUST BE AVAILABLE TO TAKE YOU HOME FOLLOWING YOUR PROCEDURE AS IT WILL NOT BE SAFE FOR YOU TO DRIVE OR TAKE PUBLIC TRANSPORTATION ALONE.     Medications    If you are instructed by Dr. Gus Puma to discontinue taking a medication, such as blood thinners, prior to surgery you will need to contact the prescribing provider of that medication to receive instructions on the safest way to do so.        FOLLOW-UP APPOINTMENTS    Your return appointment will be scheduled approximately 2-3 weeks after surgery, our scheduling office will call you to make a post operative follow up appointment after surgery.    If you need to reschedule this appointment or have questions as to the time of   your appointment please call.      OCCUPATIONAL THERAPY    You may have to attend Occupational Therapy following your surgery. A referral will be placed if it is necessary. If Occupational Therapy is determined to be necessary by your provider someone will be contacting you following your surgery to schedule. Many therapy appointments will be done same day after your first post-operative appointment with your provider.        IF YOU HAVE QUESTIONS or CONCERNS  During normal business hours, call Dr. Conard Novak clinical support staff member Jonise Weightman at [phone] for medical issues, or Dr. Conard Novak surgery scheduler, Sande Rives, at [phone] for scheduling concerns.      During evenings/nights/weekends, call [facility] at [phone] and  ask for the on-call orthopaedic surgery resident for urgent medical issues.

## 2021-07-24 NOTE — Unmapped (Signed)
ORTHOPAEDIC NEW CLINIC NOTE     ASSESSMENT:  Jesus Rubio is a 76 y.o. 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  It is possible that his symptoms are related to his cervical radiculopathy.  Because he does have signs of possible ulnar nerve compression at the elbow and definite subluxation, he may benefit from a cubital tunnel release with ulnar nerve translocation.  We discussed that prior to surgery he would need to be able to come off of one of his blood thinning medications.  He would also need cardiac clearance prior to surgery.  We could start with either extremity, which ever is more symptomatic to him.  At this time, he feels that it is the right side.  We discussed that we will be able to excise the soft tissue mass at the same time but we would prefer to have a formal ultrasound to better characterize the lesion.    PROCEDURES:  None    SUBJECTIVE:  Chief Complaint:  Bilateral upper extremity numbness.    History of Present Illness:   Jesus Rubio is a 76 y.o. 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  Patient states that he has had pain in his bilateral upper extremities for several years.  He experiences pain in his elbows that radiate into the ulnar proximal forearms.  He also endorses numbness of the small finger bilaterally (left greater than right).  He recently underwent electrodiagnostic studies that showed sonographic evidence of right ulnar nerve enlargement at the level of the elbow.  He also had sonographic evidence of left cervical radiculopathy and left ulnar mononeuropathy that is not localizable.  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  He reports that he sleeps with his arms around a pillow but with his elbows bent.  He is interested in undergoing possible surgical intervention.      Medical History   Past Medical History:   Diagnosis Date    Alcoholism (CMS-HCC)     Anxiety     Arthritis     At risk for falls     B12 deficiency     CHF (congestive heart failure) (CMS-HCC)     Coronary artery disease     Diabetes mellitus (CMS-HCC)     Financial difficulties     Hearing impairment     Heart disease     Hyperlipemia     Hypertension     Joint pain     Major depressive disorder     Peptic ulceration     PONV (postoperative nausea and vomiting)     Renal disorder         Surgical History   Past Surgical History:   Procedure Laterality Date    CARDIAC SURGERY  2006    Open heart surgery    CHOLECYSTECTOMY      FOOT ARTHRODESIS, TRIPLE      JOINT REPLACEMENT      KNEE ARTHROSCOPY  PR CATH PLACE/CORON ANGIO, IMG SUPER/INTERP,W LEFT HEART VENTRICULOGRAPHY N/A 05/03/2021    Procedure: Left Heart Catheterization;  Surgeon: Alvira Philips, MD;  Location: [facility] CATH;  Service: Cardiology    PR COLONOSCOPY FLX DX W/COLLJ SPEC WHEN PFRMD N/A 04/03/2020    Procedure: COLONOSCOPY, FLEXIBLE, PROXIMAL TO SPLENIC FLEXURE; DIAGNOSTIC, W/WO COLLECTION SPECIMEN BY BRUSH OR WASH;  Surgeon: Bronson Curb, MD;  [facility];  Service: Gastroenterology    PR COMPRE EP EVAL ABLTJ 3D MAPG TX SVT N/A 05/16/2015    Procedure: A-Flutter Ablation;  Surgeon: Dorcas Carrow, MD;  Location: [facility] EP;  Service: Cardiology    PR GI IMAG INTRALUMINAL ESOPHAGUS-ILEUM W/I&R N/A 04/05/2020    Procedure: GI VIDEO TRACT IMAGE INTRALUMINAL (EG, CAPSULE ENDOSCOPY), ESOPHAGUS VIA ILEUM, PHYSICIAN INTERPRETATION & REPORT;  Surgeon: Beverly Milch, MD;  Location: [facility];  Service: Gastrointestinal    PR REVASCULARIZE FEM/POP ARTERY,ANGIOPLASTY/STENT N/A 06/03/2021    Procedure: Peripheral Angiography W Intervetion;  Surgeon: Alvira Philips, MD;  Location: [facility] CATH;  Service: Cardiology    PR UPPER GI ENDOSCOPY,BIOPSY N/A 12/01/2013    Procedure: EGD;  Surgeon: Darletta Moll, MD;  Location: [facility];  Service: General Surgery    PROSTATE SURGERY      REPLACEMENT TOTAL KNEE      RIGHT    THYROIDECTOMY, PARTIAL        Medications   Current Outpatient Medications   Medication Sig Dispense Refill    acetaminophen (TYLENOL) 325 MG tablet Take 2 tablets (650 mg total) by mouth every six (6) hours as needed for pain.  0    albuterol HFA 90 mcg/actuation inhaler Inhale 2 puffs every six (6) hours as needed for wheezing or shortness of breath. (Patient not taking: Reported on 05/17/2021) 18 g 0    apixaban (ELIQUIS) 5 mg Tab Take 1 tablet (5 mg total) by mouth Two (2) times a day. 180 tablet 1    blood-glucose meter kit Check BS every day 1 kit 0    clopidogreL (PLAVIX) 75 mg tablet Take 1 tablet (75 mg total) by mouth daily. 30 tablet 11    cyanocobalamin, vitamin B-12, 1,000 mcg/mL injection Inject 1 mL (1,000 mcg total) under the skin every thirty (30) days. 10 mL 1    diclofenac sodium (VOLTAREN) 1 % gel Apply 2 g topically four (4) times a day. 100 g 0    dilTIAZem (CARDIZEM) 30 MG tablet 1 tab Q3 hr PRN bad palpitations, HR>110.  Be sure top number blood pressure over 95. (Patient not taking: Reported on 07/10/2021) 30 tablet 0    empty container (SHARPS-A-GATOR DISPOSAL SYSTEM) Misc Use as directed for sharps disposal 1 each 2    ergocalciferol-1,250 mcg, 50,000 unit, (DRISDOL) 1,250 mcg (50,000 unit) capsule Take 1 capsule (1,250 mcg total) by mouth once a week. 12 capsule 1    evolocumab (REPATHA SURECLICK) 140 mg/mL PnIj Inject the contents of one pen (140 mg) under the skin every fourteen (14) days. (Patient not taking: Reported on 07/10/2021) 2 mL 11    fluticasone propionate (FLONASE) 50 mcg/actuation nasal spray 2 sprays into each nostril in the morning. 16 g 5    glipiZIDE (GLUCOTROL) 5 MG tablet Take 1 tablet (5 mg total) by mouth in the morning. For diabetes.. 30 tablet 1    isosorbide mononitrate (IMDUR) 60 MG 24 hr tablet Take 1 tablet (60 mg total) by mouth daily. 30 tablet 11    metoprolol  succinate (TOPROL-XL) 100 MG 24 hr tablet TAKE 2 TABLETS BY MOUTH EVERY DAY 180 tablet 1    miscellaneous medical supply Misc Glucometer  Dx: E11.9 1 each 0    miscellaneous medical supply Misc Glucometer test strips   Dx: E11.9 50 each 5    miscellaneous medical supply Misc Lancets  Dx  E11.9 100 each 1    nitroglycerin (NITROLINGUAL) 0.4 mg/dose spray Place 1 spray under the tongue every five (5) minutes as needed for chest pain. (Patient not taking: Reported on 07/10/2021)      nitroglycerin (NITROSTAT) 0.4 MG SL tablet Place 1 tablet (0.4 mg total) under the tongue every five (5) minutes as needed for chest pain. Maximum of 3 doses in 15 minutes. 100 tablet 3    omeprazole (PRILOSEC) 10 MG capsule TAKE 1 CAPSULE BY MOUTH EVERY DAY PLUS 20 MG TO EQUAL 30 MG FOR TAPERING OFF. 90 capsule 1    omeprazole (PRILOSEC) 20 MG capsule Take 1 capsule (20 mg total) by mouth Two (2) times a day. 180 capsule 1    ONETOUCH VERIO TEST STRIPS Strp USE TO CHECK BLOOD SUGAR 1-2 TIMES PER DAY 100 strip 1    pregabalin (LYRICA) 50 MG capsule Take 1 capsule (50 mg total) by mouth Two (2) times a day. 60 capsule 5    tiZANidine (ZANAFLEX) 4 MG tablet Take 1 tablet (4 mg total) by mouth every six (6) hours as needed. 30 tablet 0    traZODone (DESYREL) 50 MG tablet Take 1 tablet (50 mg total) by mouth nightly. 90 tablet 0     No current facility-administered medications for this visit.      Allergies   Escitalopram oxalate and Ace inhibitors     Social History Social History     Socioeconomic History    Marital status: Married   Occupational History    Occupation: retired   Tobacco Use    Smoking status: Former     Packs/day: 1.00     Years: 15.00     Pack years: 15.00     Types: Cigarettes     Quit date: 1974     Years since quitting: 49.3    Smokeless tobacco: Never   Vaping Use    Vaping Use: Never used   Substance and Sexual Activity    Alcohol use: No     Comment: QUIT IN 1974    Drug use: No     Comment: QUIT IN 1974    Sexual activity: Yes     Partners: Female     Birth control/protection: None   Other Topics Concern    Exercise Yes     Comment: 3-4x's a wk    Living Situation Yes     Comment: wife   Social History Narrative        06/04/2017        [facility] Components:        Family, social, cultural characteristics: Patient reports that he lost a brother in December and now has another brother that is actively sick, reports increase stress .      Patient has the following communication needs: none, per patient     Health Literacy: How confident are you that you understand your health issues/concerns, can participate in your care, and manage your care along with your physician: confident.    Behaviors Affecting Health: none, per patient    Family history of mental health illness and/or substance abuse: asked patient/parent and none disclosed.  Have you been seen by any medical provider that we have not referred you to since your last visit ? No    Discussed a Living Will with the patient and ZOX:WRUE decline information about Living Will today.        12/03/16    08/02/16    01/18/2016        [facility] Components:        Family, social, cultural characteristics: none disclosed .      Patient has the following communication needs: none disclosed     Health Literacy: How confident are you that you understand your health issues/concerns, can participate in your care, and manage your care along with your physician: somewhat confident.    Behaviors Affecting Health: none, per patient    Family history of mental health illness and/or substance abuse: asked patient/parent and none disclosed.    Have you been seen by any medical provider that we have not referred you to since your last visit ? No        Past Psychiatric History as of 07/05/13:    Prior psychiatric diagnoses: reports I have never been diagnosed, per notes MDD    Psychiatric hospitalizations: in his 30s had hosp at [place] for panic/anixety    Inpatient substance abuse treatment: None    Outpatient treatment: last saw psychiatrist about 20 years ago, last had biofeedback for anxiety 35 years ago    Suicide attempts: None    Non-suicidal self-injury: None    Medication trials/compliance: States he has tried several antidepressants but can only recall Lexapro and Cymbalta; medications only helpful for a few weeks then lose effectiveness    Current psychiatrist: Seen as new pt in [facility] on 07/05/13 by Dr. Consuela Mimes    Current therapist: None        Social History as of 07/05/13:    Living situation: the patient lives with their spouse.    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 Informant: the patient     Domestic Violence: No. Informant: the patient     Exposure/Witness to Violence: None    Protective Services Involvement: None    Current/Prior Legal: None    Physical Aggression/Violence: None          Substance Abuse History as of 07/05/13:        Access to Firearms: Yes, has two antique firearms, have not been fired in over 20 yrs, not loaded     Gang Involvement: None        Family Psychiatric History:    Sister with depression, Mother with depression; no bipolar disorder, no psychotic disorder; no family history of suicide         Social Determinants of Health     Transportation Needs: No Transportation Needs    Lack of Transportation (Medical): No    Lack of Transportation (Non-Medical): No          Family History Patient denies significant family history.         Review of Systems A 10 system review of systems in addition to  the musculoskeletal system was performed by intake questionnaire and was negative except as noted in HPI.     OBJECTIVE:  Physical Examination:    General  Well nourished, appearing stated age   Not in acute distress   Alert and oriented x3  Appropriate affect and mood  Appropriate to conversation   No increased work of breathing on room air    Musculoskeletal  Right Upper Extremity:  Skin  -appears pink and well perfused with no evidence of trauma.   ROM  -pain with elbow flexion but full range of motion at the elbow, wrist and fingers  Inspection/Palpation  -tender to palpation over the medial elbow and proximal forearm   - 5 x 4 cm mobile soft tissue mass just proximal to the antecubital fossa  Motor/Strength  -Wrist extension, wrist flexion, IO, grip strength 5/5  Sensory  -ulnar nerve distribution with diminished sensation to light touch   Stability  - No gross instability noted at the elbow, wrist, or fingers  Vascular  -fingers are warm, with brisk capillary refill  Provocative testing  - Equivocal elbow flexion test  - Ulnar nerve subluxation noted  - Tinel at the elbow negative    Left Upper Extremity:  Skin  -appears pink and well perfused with no evidence of trauma.   ROM  -active ROM fingers, wrist is painless.   -Mild pain with extremes of elbow flexion and extension  Inspection/Palpation  -tender to palpation along the medial elbow soft tissue  - Mild interosseus wasting noted  Motor/Strength  -Wrist extension, wrist flexion, IO, grip strength 5/5  Sensory  -ulnar nerve distribution with diminished sensation to light touch   Stability  - No gross instability noted at the elbow, wrist, or fingers  Vascular  -fingers are warm, with brisk capillary refill  Provocative testing  -Carpal tunnel Tinel's positive   Durkin's compressive testing positive at the carpal tunnel   Cubital tunnel Tinel's negative   - Negative elbow flexion test   - Ulnar nerve subluxation noted      Test Results  Imaging  EMG 05/02/2021: Evidence of a chronic left lower cervical radiculopathy.  Right ulnar nerve enlarged at the epicondyle and sonographic evidence of a lipoma. Non-localizable left ulnar mononeuropathy      Problem List  Active Problems:    * No active hospital problems. *

## 2021-07-25 ENCOUNTER — Ambulatory Visit: Admit: 2021-07-25 | Discharge: 2021-07-26 | Payer: MEDICARE | Attending: Family Medicine | Primary: Family Medicine

## 2021-07-25 DIAGNOSIS — R42 Dizziness and giddiness: Principal | ICD-10-CM

## 2021-07-25 DIAGNOSIS — I959 Hypotension, unspecified: Principal | ICD-10-CM

## 2021-07-25 DIAGNOSIS — E119 Type 2 diabetes mellitus without complications: Principal | ICD-10-CM

## 2021-07-25 NOTE — Unmapped (Signed)
BUMP - LVM/MC/Txt to r/s appt. with Dr. Fabian November

## 2021-07-25 NOTE — Unmapped (Signed)
Health Maintenance Due   Topic Date Due    COVID-19 Vaccine (1) Never done    Zoster Vaccines (1 of 2) Never done    DTaP/Tdap/Td Vaccines (1 - Tdap) 04/08/2002           Goals & Recommendations:    Goals and Recommendations related to your health are outlined below.    If discussed and provided at your visit today: Please, keep and record your blood sugar, blood pressure, weight, fluid intake, dietary intake and exercise amount and frequency.     Blood Pressure Goals (unless otherwise instructed):  Check 2-3 times a week.  Typical average out of office measurement: <135 mmHg systolic and < 85 mmHg diastolic (less than 135/85).  More strict recommendations for out of office measurement: < 130 mmHg systolic and < 80 mmHg diastolic (less than 130/80).  Typical in office measurement <140 mmHg systolic and <90 mmHg diastolic (less than 140/90).  Most people will not feel well if blood pressure drops to less than 110/70, unless they typically have blood pressures in this range.   If you are noticing sudden increases or decreases in blood pressure, then please contact the office.    Diabetes: Goal Hemoglobin A1c 7.9 or lower.  Goal Blood Sugars Fasting: 60-140 (may check fasting and two hours after a meal later in the day).    For Monitoring your Mood your PHQ-9 Goal is less than 10.    Drink 48 ounces of water per day.  Avoid sweetened beverages.  You will generally feel better and experience improved health when you limit, and if possible avoid, processed foods.  Diets high in plant based foods, vegetables and fruits are associated with many health benefits.    Exercise for a minimum of 20 minutes daily. Minimum of four days a week.   If possible try to obtain 60 minutes of exercise daily for five or more days per week.  Regular movement throughout the day is best.   Walking is a good form of movement.   Squats, lunges, step-ups, push-ups, planks, and arm exercises with or without weights or resistance bands are good forms of exercise.    If your BMI (body mass index) is above 25 then your body is carrying more weight than is typically healthy.  If your BMI (body mass index) is above 25 and you are not achieving the healthy weight goals that you desire, then consider the following:  Keep calories below 2500 calories per day unless otherwise instructed. Remember that all calories are not equal.  Keep a food journal and record accurately.  Read labels and ingredient lists on the products that you consume.  Sometimes we need to adjust your level of physical activity, and or tailor an exercise program more specifically for your needs, just ask and we will come up with a plan.  If you have questions about any of these recommendations, please ask.    If you smoke, stop.  If you have heart failure or severe kidney disease: weigh yourself daily and maintain fluid intake to less than 2 Liters daily unless otherwise instructed.  If you have diabetes you need to see the eye doctor at least every one to two years and have a yearly foot exam.  Set aside time daily for relaxation.  Plan a pleasant activity that you can look forward to and enjoy.  Medication changes are outlined on your After Visit Summary.  Any referrals placed today will be outlined on your After  Visit Summary.    If you have questions, please call the office or use MyChart.   If you need a refill on a medication, please contact your pharmacy.  Please, allow at least two business days for responses to refill requests.    Please, bring all medications and when possible original prescription containers to each and every office visit.    If you have been provided and / or requested to record your blood sugars, blood pressures, weight, fluid intake, dietary intake, or exercise, then please bring records to each and every appointment.    When you see other providers for health care, please request that they send information and records related to your visit to our office. This includes vaccinations, mammograms, radiology, procedures, diagnostic testing, lab testing and physical exams or wellness exams that you have outside of the [facility].      [facility]  [address], [address]  [place], [place] [address]  Office: [phone]  Fax: [phone]             Learning About Mindfulness for Stress  What are mindfulness and stress?    Stress is what you feel when you have to handle more than you are used to. A lot of things can cause stress. You may feel stress when you go on a job interview, take a test, or run a race. This kind of short-term stress is normal and even useful. It can help you if you need to work hard or react quickly.  Stress also can last a long time. Long-term stress is caused by stressful situations or events. Examples of long-term stress include long-term health problems, ongoing problems at work, and conflicts in your family. Long-term stress can harm your health.  Mindfulness is a focus only on things happening in the present moment. It's a process of purposefully paying attention to and being aware of your surroundings, your emotions, your thoughts, and how your body feels. You are aware of these things, but you aren't judging these experiences as good or bad. Mindfulness can help you learn to calm your mind and body to help you cope with illness, pain, and stress.  How does mindfulness help to relieve stress?  Mindfulness can help quiet your mind and relax your body. Studies show that it can help some people sleep better, feel less anxious, and bring their blood pressure down. And it's been shown to help some people live and cope better with certain health problems like heart disease, depression, chronic pain, and cancer.  How do you practice mindfulness?  To be mindful is to pay attention, to be present, and to be accepting.  When you're mindful, you do just one thing and you pay close attention to that one thing. For example, you may sit quietly and notice your emotions or how your food tastes and smells.  When you're present, you focus on the things that are happening right now. You let go of your thoughts about the past and the future. When you dwell on the past or the future, you miss moments that can heal and strengthen you. You may miss moments like hearing a child laugh or seeing a friendly face when you think you're all alone.  When you're accepting, you don't judge the present moment. Instead you accept your thoughts and feelings as they come.  You can practice anytime, anywhere, and in any way you choose. You can practice in  many ways. Here are a few ideas:  While doing your chores, like washing the dishes, let your mind focus on what's in your hand. What does the dish feel like? Is the water warm or cold?  Go outside and take a few deep breaths. What is the air like? Is it warm or cold?  When you can, take some time at the start of your day to sit alone and think.  Take a slow walk by yourself. Count your steps while you breathe in and out.  Try yoga breathing exercises, stretches, and poses to strengthen and relax your muscles.  At work, if you can, try to stop for a few moments each hour. Note how your body feels. Let yourself regroup and let your mind settle before you return to what you were doing.  If you struggle with anxiety or worry thoughts, imagine your mind as a blue sky and your worry thoughts as clouds. Now imagine those worry thoughts floating across your mind's sky. Just let them pass by as you watch.  Follow-up care is a key part of your treatment and safety. Be sure to make and go to all appointments, and call your doctor if you are having problems. It's also a good idea to know your test results and keep a list of the medicines you take.  Where can you learn more?  Go to [facility] at [email or web address]  Select Preferences in the upper right hand corner, then select Health Library under Resources. Enter [phone] in the search box to learn more about Learning About Mindfulness for Stress.  Current as of: January 15, 2016  Content Version: 11.6  ?? 2006-2018 [employer]. Care instructions adapted under license by [facility]. If you have questions about a medical condition or this instruction, always ask your healthcare professional. [employer], Inc[employer] disclaims any warranty or liability for your use of this information.         Learning About Guided Imagery for Stress  What are guided imagery and stress?    Stress is what you feel when you have to handle more than you are used to. A lot of things can cause stress. You may feel stress when you go on a job interview, take a test, or run a race. This kind of short-term stress is normal and even useful. It can help you if you need to work hard or react quickly.  Stress also can last a long time. Long-term stress is caused by stressful situations or events. Examples of long-term stress include long-term health problems, ongoing problems at work, and conflicts in your family. Long-term stress can harm your health.  Guided imagery is a technique of directed thoughts and suggestions that guide your mind toward a relaxed, focused state. This technique helps you use your mind to take you to a calm, peaceful place. You can use it to relax, which can lower blood pressure and reduce other problems related to stress.  How does guided imagery help to relieve stress?  Because of the way the mind and body are connected, guided imagery can make you feel like you are experiencing something just by imagining it. You can achieve a relaxed state when you imagine all the details of a safe, comfortable place, such as a beach or a garden. This relaxed state may help you feel more in control of your emotions and thought processes. Feeling in control may improve your attitude, health, and sense of well-being.  How do you do guided imagery?  You can use a smartphone app or a video to lead you through the process. You use all of your senses in guided imagery. For example, if you want a tropical setting, you can imagine the warm breeze on your skin, the bright blue of the water, the sound of the surf, the sweet scent of tropical flowers, and the taste of coconut. Imagining those things can make you actually feel like you're there.  But you don't have to imagine the tropics to feel peace. Guided imagery can take you to your own restful place. To give guided imagery a try, follow these steps:  Lean back comfortably in your chair. If you can, close your eyes. Put your arms on the armrests, or fold your hands in your lap.  Take a deep breath through your nose. Breathe in slowly, and then let the air out completely through your mouth.  Do it again slowly. Keep breathing like this. Gather up any tension in your body, and send it out with every breath.  Let a feeling of warmth spread from your lungs to your neck and head, down your arms to your fingertips, through your body and into your legs, all the way down to your toes. Stay this way for a moment.  Now imagine a pleasant day. You're at a park or at a place you've visited in the past where you felt at peace.  In your mind's eye, look at what lies in front of you. Maybe you see the sun, filtered through trees. Maybe clouds are drifting by.  Look to one side, and then the other. Notice the feel of the air around you. Notice how it feels on your face and on your arms.  Stay here for a while. Let it become real for you.  Follow-up care is a key part of your treatment and safety. Be sure to make and go to all appointments, and call your doctor if you are having problems. It's also a good idea to know your test results and keep a list of the medicines you take.  Where can you learn more?  Go to [facility] at [email or web address]  Select Preferences in the upper right hand corner, then select Health Library under Resources. Enter N291 in the search box to learn more about Learning About Guided Imagery for Stress.  Current as of: January 15, 2016  Content Version: 11.6  ?? 2006-2018 [employer]. Care instructions adapted under license by [facility]. If you have questions about a medical condition or this instruction, always ask your healthcare professional. [employer], Incorporated disclaims any warranty or liability for your use of this information.        Learning About the Mediterranean Diet  What is the Mediterranean diet?    The Mediterranean diet is a style of eating rather than a diet plan. It features foods eaten in [place], [place], southern [place], and other countries along the [employer]. It emphasizes eating foods like fish, fruits, vegetables, beans, high-fiber breads and whole grains, nuts, and olive oil. This style of eating includes limited red meat, cheese, and sweets.  Why choose the Mediterranean diet?  A Mediterranean-style diet may improve heart health. It contains more fat than other heart-healthy diets. But the fats are mainly from nuts, unsaturated oils (such as fish oils and olive oil), and certain nut or seed oils (such as canola, soybean, or flaxseed oil). These fats may help protect the heart and blood vessels.  How can you get started  on the Mediterranean diet?  Here are some things you can do to switch to a more Mediterranean way of eating.  What to eat  Eat a variety of fruits and vegetables each day, such as grapes, blueberries, tomatoes, broccoli, peppers, figs, olives, spinach, eggplant, beans, lentils, and chickpeas.  Eat a variety of whole-grain foods each day, such as oats, brown rice, and whole wheat bread, pasta, and couscous.  Eat fish at least 2 times a week. Try tuna, salmon, mackerel, lake trout, herring, or sardines.  Eat moderate amounts of low-fat dairy products, such as milk, cheese, or yogurt.  Eat moderate amounts of poultry and eggs.  Choose healthy (unsaturated) fats, such as nuts, olive oil, and certain nut or seed oils like canola, soybean, and flaxseed.  Limit unhealthy (saturated) fats, such as butter, palm oil, and coconut oil. And limit fats found in animal products, such as meat and dairy products made with whole milk. Try to eat red meat only a few times a month in very small amounts.  Limit sweets and desserts to only a few times a week. This includes sugar-sweetened drinks like soda.  The Mediterranean diet may also include red wine with your meal--1 glass each day for women and up to 2 glasses a day for men.  Tips for eating at home  Use herbs, spices, garlic, lemon zest, and citrus juice instead of salt to add flavor to foods.  Add avocado slices to your sandwich instead of bacon.  Have fish for lunch or dinner instead of red meat. Brush the fish with olive oil, and broil or grill it.  Sprinkle your salad with seeds or nuts instead of cheese.  Cook with olive or canola oil instead of butter or oils that are high in saturated fat.  Switch from 2% milk or whole milk to 1% or fat-free milk.  Dip raw vegetables in a vinaigrette dressing or hummus instead of dips made from mayonnaise or sour cream.  Have a piece of fruit for dessert instead of a piece of cake. Try baked apples, or have some dried fruit.  Tips for eating out  Try broiled, grilled, baked, or poached fish instead of having it fried or breaded.  Ask your server to have your meals prepared with olive oil instead of butter.  Order dishes made with marinara sauce or sauces made from olive oil. Avoid sauces made from cream or mayonnaise.  Choose whole-grain breads, whole wheat pasta and pizza crust, brown rice, beans, and lentils.  Cut back on butter or margarine on bread. Instead, you can dip your bread in a small amount of olive oil.  Ask for a side salad or grilled vegetables instead of french fries or chips.  Where can you learn more?  Go to [facility] at [email or web address]  Select Health Library under the Resources menu. Enter O407 in the search box to learn more about Learning About the Mediterranean Diet.  Current as of: February 11, 2017  Content Version: 12.2  ?? 2006-2019 [employer], [employer]. Care instructions adapted under license by [facility]. If you have questions about a medical condition or this instruction, always ask your healthcare professional. [employer], [employer] disclaims any warranty or liability for your use of this information.            Stretching: Exercises  Your Care Instructions  Here are some examples of exercises for stretching. Start each exercise slowly. Ease off the exercise if you start to have pain.  Your doctor or  physical therapist will tell you when you can start these exercises and which ones will work best for you.  How to do the exercises  Latissimus stretch    Stand with your back straight and your feet shoulder-width apart. You can do this stretch sitting down if you are not steady on your feet.  Hold your arms above your head, and hold one hand with the other.  Pull upward while leaning straight over toward your right side. Keep your lower body straight. You should feel the stretch along your left side.  Hold 15 to 30 seconds, and then switch sides.  Repeat 2 to 4 times for each side.  Triceps stretch    Stand with your back straight and your feet shoulder-width apart. You can do this stretch sitting down if you are not steady on your feet.  Bring your left elbow straight up while bending your arm.  Grab your left elbow with your right hand, and pull your left elbow toward your head with light pressure. If you are more flexible, you may pull your arm slightly behind your head. You will feel the stretch along the back of your arm.  Hold 15 to 30 seconds, and then switch elbows.  Repeat 2 to 4 times for each arm.  Calf stretch    Place your hands on a wall for balance. You can also do this with your hands on the back of a chair, a countertop, or a tree.  Step back with your left leg. Keep the leg straight, and press your left heel into the floor.  Press your hips forward, bending your right leg slightly. You will feel the stretch in your left calf.  Hold the stretch 15 to 30 seconds.  Repeat 2 to 4 times for each leg.  Quadriceps stretch    Lie on your side with one hand supporting your head.  Bend your upper leg back and grab your ankle with your other hand.  Stretch your leg back by pulling your foot toward your buttocks. You will feel the stretch in the front of your thigh. If this causes stress on your knees, do not do this stretch.  Hold the stretch 15 to 30 seconds.  Repeat 2 to 4 times for each leg.  Groin stretch    Sit on the floor and put the soles of your feet together. Do not slump your back.  Grab your ankles and gently pull your legs toward you.  Press your knees toward the floor. You will feel the stretch in your inner thighs.  Hold 15 to 30 seconds.  Repeat 2 to 4 times.  Hamstring stretch in doorway    Lie on the floor near a doorway, with your buttocks close to the wall.  Let the leg you are not stretching extend through the doorway.  Put the leg you want to stretch up on the wall, and straighten your knee to feel a gentle stretch at the back of your leg.  Hold the stretch for at least 15 to 30 seconds. Repeat 2 to 4 times.  Follow-up care is a key part of your treatment and safety. Be sure to make and go to all appointments, and call your doctor if you are having problems. It's also a good idea to know your test results and keep a list of the medicines you take.  Where can you learn more?  Go to [facility] at [email or web address]  Select Preferences in the upper right hand corner, then  select Health Library under Resources. Enter P733 in the search box to learn more about Stretching: Exercises.  Current as of: March 13, 2016  Content Version: 11.6  ?? 2006-2018 [employer], [employer]. Care instructions adapted under license by [facility]. If you have questions about a medical condition or this instruction, always ask your healthcare professional. [employer], [employer] disclaims any warranty or liability for your use of this information.       Patient Education        Preventing Falls: Care Instructions  Overview     Getting around your home safely can be a challenge if you have injuries or health problems that make it easy for you to fall. Loose rugs and furniture in walkways are among the dangers for many older people who have problems walking or who have poor eyesight. People who have conditions such as arthritis, osteoporosis, or dementia also have to be careful not to fall.  You can make your home safer with a few simple measures.  Follow-up care is a key part of your treatment and safety. Be sure to make and go to all appointments, and call your doctor if you are having problems. It's also a good idea to know your test results and keep a list of the medicines you take.  How can you care for yourself at home?  Taking care of yourself  Exercise regularly to improve your strength, muscle tone, and balance. Walk if you can. Swimming may be a good choice if you cannot walk easily.  Have your vision and hearing checked each year or any time you notice a change. If you have trouble seeing and hearing, you might not be able to avoid objects and could lose your balance.  Know the side effects of the medicines you take. Ask your doctor or pharmacist whether the medicines you take can affect your balance. Sleeping pills or sedatives can affect your balance.  Limit the amount of alcohol you drink. Alcohol can impair your balance and other senses.  Ask your doctor whether calluses or corns on your feet need to be removed. If you wear loose-fitting shoes because of calluses or corns, you can lose your balance and fall.  Talk to your doctor if you have numbness in your feet.  You may get dizzy if you do not drink enough water. To prevent dehydration, drink plenty of fluids. Choose water and other clear liquids. If you have kidney, heart, or liver disease and have to limit fluids, talk with your doctor before you increase the amount of fluids you drink.  Preventing falls at home  Remove raised doorway thresholds, throw rugs, and clutter. Repair loose carpet or raised areas in the floor.  Move furniture and electrical cords to keep them out of walking paths.  Use nonskid floor wax, and wipe up spills right away, especially on ceramic tile floors.  If you use a walker or cane, put rubber tips on it. If you use crutches, clean the bottoms of them regularly with an abrasive pad, such as steel wool.  Keep your house well lit, especially stairways, porches, and outside walkways. Use night-lights in areas such as hallways and bathrooms. Add extra light switches or use remote switches (such as switches that go on or off when you clap your hands) to make it easier to turn lights on if you have to get up during the night.  Install sturdy handrails on stairways.  Move items in your cabinets so that the things you  use a lot are on the lower shelves (about waist level).  Keep a cordless phone and a flashlight with new batteries by your bed. If possible, put a phone in each of the main rooms of your house, or carry a cell phone in case you fall and cannot reach a phone. Or, you can wear a device around your neck or wrist. You push a button that sends a signal for help.  Wear low-heeled shoes that fit well and give your feet good support. Use footwear with nonskid soles. Check the heels and soles of your shoes for wear. Repair or replace worn heels or soles.  Do not wear socks without shoes on smooth floors, such as wood.  Walk on the grass when the sidewalks are slippery. If you live in an area that gets snow and ice in the winter, sprinkle salt on slippery steps and sidewalks. Or ask a family member or friend to do this for you.  Preventing falls in the bath  Install grab bars and nonskid mats inside and outside your shower or tub and near the toilet and sinks.  Use shower chairs and bath benches.  Use a hand-held shower head that will allow you to sit while showering.  Get into a tub or shower by putting the weaker leg in first. Get out of a tub or shower with your strong side first.  Repair loose toilet seats and consider installing a raised toilet seat to make getting on and off the toilet easier.  Keep your bathroom door unlocked while you are in the shower.  Where can you learn more?  Go to [facility] at [email or web address] forces logistics/support/administrative officer in the Menu. Enter G117 in the search box to learn more about Preventing Falls: Care Instructions.  Current as of: February 13, 2021               Content Version: 13.6  ?? 2006-2023 [employer].   Care instructions adapted under license by [facility]. If you have questions about a medical condition or this instruction, always ask your healthcare professional. [employer] disclaims any warranty or liability for your use of this information.

## 2021-07-27 DIAGNOSIS — E119 Type 2 diabetes mellitus without complications: Principal | ICD-10-CM

## 2021-07-27 MED ORDER — GLIPIZIDE 5 MG TABLET
ORAL_TABLET | Freq: Every day | ORAL | 1 refills | 0 days
Start: 2021-07-27 — End: ?

## 2021-07-29 MED ORDER — GLIPIZIDE 5 MG TABLET
ORAL_TABLET | Freq: Every day | ORAL | 1 refills | 30 days | Status: CP
Start: 2021-07-29 — End: ?

## 2021-07-29 NOTE — Unmapped (Signed)
4/24 LVM to r/s appt 7/25-Can be scheduled in HB with Dr. Fabian November

## 2021-07-29 NOTE — Unmapped (Signed)
Assessment/Plan     Diagnoses and all orders for this visit:    Type 2 diabetes mellitus without complication, without long-term current use of insulin (CMS-HCC)  -     POCT glucose  Stable at present.  Continue current plan of care.    Dizziness with Hypotension, unspecified hypotension type  -     Orthostatic blood pressure  Given orthostasis recommend decreasing isosorbide to 30 mg daily and monitoring.  Will reach out to cardiologist regarding other possibilities and adjustment of medication in the setting of history of coronary artery disease.    Coronary artery disease   Clinically asymptomatic.   Standard education and anticipatory guidance regarding goals, objectives, and sequelae.  Otherwise continue current management and plan of care.  Continue aggressive risk factor reduction.    Diagnoses and plan along with any newly prescribed medication(s), recommendations, orders and therapies were discussed in detail with patient today.   Standard education and anticipatory guidance regarding goals, objectives and potential sequelae were reviewed. Patient voiced appropriate understanding of assessment and plan of care.    Pertinent handouts were given today and reviewed with the patient as indicated.  The Care Plan and Self-Management goals have been included on the AVS and the AVS has been printed.  Where helpful for monitoring and management I have encouraged the patient to keep regular logs for me to review at their next visit. Any outside resources or referrals needed at this time are noted above. No additional referrals necessary at this time. Patient voiced understanding and all questions have been answered to satisfaction.     To complete this documentation in a timely manner, portions of this encounter may have been completed utilizing Dragon voice recognition software. Please take this into consideration in noting nuances of documentation, grammar, punctuation, and wording. If and when a question exists, or for clarification, please contact the author.    Barron Alvine, MD  [facility]  [address], [address]  [place], [name] [address]  Phone [phone]    Subjective:     NWG:NFAOZ [name], MD    Kaidan Harpster, is a 76 y.o. male    Chief Complaint   Patient presents with    Diabetes     Patient states that he does check blood sugar. Patient states last night his reading was 198    Diarrhea     Patient has been having diarrhea since starting Glipizide again.     Bleeding/Bruising     Patient has spot on his arm that is concerning     Dizziness     Patient states that coming in he felt dizzy. Patient states he felt a little of balanced.       HPI:    Patient presents: With wife.    Nursing notes and history reviewed with patient and wife.    Diabetes mellitus type 2.  Goal A1c less than 8.  No hypo or hyperglycemic symptoms.  Compliant with medication regimen.  Blood sugar last night was 198.  Denies any diabetic related complications.  Feels like he has been having loose stools with glipizide, though this recently abated.    Dizziness with hypotension.  Symptoms most significant with changing positions going from sitting to standing or standing to walking.  Not really vertiginous as he does not really get symptoms with turning over in bed or looking side to side.  No clear strokelike symptoms.  No presyncope  or syncope.  No acute changes to medications overall.    Coronary artery disease.  No chest pain palpitations shortness of breath orthopnea or PND.  Most recent relevant notes data imaging heart catheterization reviewed.    No other complaints.  No additional recent health care visits elsewhere. No urgent care or ED visits.  No additional palliative, provocative, modifying, quantifying, qualifying, or other related factors.  Unless otherwise outlined above patient is pleased with current medication regimen, admits compliance and denies side effects of medication.    The following portions of the patient's history were reviewed and updated as appropriate: allergies, current medications, past family history, past medical history, past social history, past surgical history and problem list.    ROS: Pertinent positives and negatives are as outlined in the HPI above. The remaining balance of the 12 point review of systems is otherwise benign.      PCMH Components:      Goals         <130/80       Lack of exercise or physical activity    Increase exercise or physical activity          Get heart rate stabilized (pt-stated)         Things to think about to help me reach my goal:     What are you going to do? Take medications as prescribed, Keep positive thinking, Pace activities, Stay hydrated   How and how much? Daily   How frequent? Daily   Barriers to success?    Solutions to barriers?      06/04/17: Patient continues to work on this goal   09/29/17 Patient continues to work on this goal   12-09-2017: Reports he continues to work on this and is staying positive.        Take actions to prevent falling       12-03-2016: New goal: Will change positions slowly and pay attention to pathways. Uses cane or walker at times.   09/29/17 Patient continues to work on this goal   12-09-2017: Reports 1 fall 6-8 months ago when he missed a step and fell. Denies injury. Uses cane occasionally.                Family characteristics, patient's social characteristics, patient's cultural characteristics, patient's communication needs, patient's health literacy and behaviors affecting patient's health are outlined under the patient's History Section, Longitudinal Care Plan or Problem List in Epic or as pertinent in History of Present Illness.    Barriers to goals identified and addressed.   See Assessment and Plan for additional pertinent details.    Objective:     Physical Exam   BP 108/66  - Pulse 73  - Temp 37.2 ??C (98.9 ??F) (Oral)  - Resp 18  - Ht 177.8 cm (5' 10)  - Wt (!) 111.4 kg (245 lb 8 oz)  - SpO2 97%  - BMI 35.23 kg/m??   Constitutional: Oriented to person, place, and time. Appears well-developed and well-nourished. No distress.   Head: Normocephalic and atraumatic.   Mouth/Throat: Oropharynx is without mass or concerning lesions. Moist mucous membranes.    Eyes: Conjunctivae are normal. No scleral icterus.   Neck: Neck supple. Normal range of motion. No JVD present. No thyromegaly present. No concerning neck mass.   Cardiovascular: Normal rate. Regular rhythm. Normal heart sounds. No murmur heard.  Pulmonary/Chest: Effort normal. No respiratory distress. Breath sounds normal. No wheezes. No rhonchi. No rales. No focal breath sound  changes. No chest tenderness.  Abdominal: Soft. Non-distended. No abdominal mass. No tenderness. No rebound. No guarding.   Musculoskeletal: No active synovitis. No concerning joint erythema, warmth or swelling.   Range of motion is without evidence of significant impairment.   Strength is without evidence of significant impairment.   No edema of the extremities.   Lymphadenopathy: There is no lymphadenopathy about the head or neck.  Neurological: Alert and oriented to person, place, and time. No cranial nerve deficit. Normal muscle tone. Normal coordination.  Skin: Skin is warm and dry. No pallor. No rash noted.   Psychiatric: Normal mood, affect and behavior. Judgment and thought content normal.

## 2021-07-31 ENCOUNTER — Ambulatory Visit: Admit: 2021-07-31 | Discharge: 2021-08-01 | Payer: MEDICARE

## 2021-07-31 DIAGNOSIS — I240 Acute coronary thrombosis not resulting in myocardial infarction: Principal | ICD-10-CM

## 2021-07-31 DIAGNOSIS — R2231 Localized swelling, mass and lump, right upper limb: Principal | ICD-10-CM

## 2021-07-31 DIAGNOSIS — I259 Chronic ischemic heart disease, unspecified: Principal | ICD-10-CM

## 2021-07-31 DIAGNOSIS — E785 Hyperlipidemia, unspecified: Principal | ICD-10-CM

## 2021-07-31 DIAGNOSIS — I5031 Acute diastolic (congestive) heart failure: Principal | ICD-10-CM

## 2021-07-31 DIAGNOSIS — E119 Type 2 diabetes mellitus without complications: Principal | ICD-10-CM

## 2021-07-31 DIAGNOSIS — G5621 Lesion of ulnar nerve, right upper limb: Principal | ICD-10-CM

## 2021-07-31 DIAGNOSIS — I10 Essential (primary) hypertension: Principal | ICD-10-CM

## 2021-07-31 DIAGNOSIS — K21 Gastroesophageal reflux disease with esophagitis, unspecified whether hemorrhage: Principal | ICD-10-CM

## 2021-07-31 DIAGNOSIS — I2510 Atherosclerotic heart disease of native coronary artery without angina pectoris: Principal | ICD-10-CM

## 2021-07-31 NOTE — Unmapped (Signed)
Addendum  created 07/31/21 1542 by Lendell Caprice, MD    Cosign clinical note with attestation

## 2021-07-31 NOTE — Unmapped (Signed)
Per Anesthesia's guidelines:    Please take the following medications the morning of your procedure with a sip of water:    Isosorbide mononitrate, metoprolol.     Patient will review medicaitons,including plan for Plavix and Eliquis, with anesthesia provider following RN interview. Patient and  caregiver will contact prescribing provider for guidance.

## 2021-08-02 NOTE — Unmapped (Signed)
[facility] Clinical Assessment & Refill Coordination Note    Jesus Rubio, [place]: [birth year]  Phone: [phone] (home)     All above HIPAA information was verified with patient's family member, Spoke to Mrs Cathey today..     Was a translator used for this call? No    Specialty Medication(s):   General Specialty: Repatha     Current Outpatient Medications   Medication Sig Dispense Refill    acetaminophen (TYLENOL) 325 MG tablet Take 2 tablets (650 mg total) by mouth every six (6) hours as needed for pain.  0    albuterol HFA 90 mcg/actuation inhaler Inhale 2 puffs every six (6) hours as needed for wheezing or shortness of breath. (Patient not taking: Reported on 05/17/2021) 18 g 0    apixaban (ELIQUIS) 5 mg Tab Take 1 tablet (5 mg total) by mouth Two (2) times a day. 180 tablet 1    blood-glucose meter kit Check BS every day 1 kit 0    clopidogreL (PLAVIX) 75 mg tablet Take 1 tablet (75 mg total) by mouth daily. 30 tablet 11    cyanocobalamin, vitamin B-12, 1,000 mcg/mL injection Inject 1 mL (1,000 mcg total) under the skin every thirty (30) days. 10 mL 1    diclofenac sodium (VOLTAREN) 1 % gel Apply 2 g topically four (4) times a day. 100 g 0    dilTIAZem (CARDIZEM) 30 MG tablet 1 tab Q3 hr PRN bad palpitations, HR>110.  Be sure top number blood pressure over 95. (Patient not taking: Reported on 07/10/2021) 30 tablet 0    empty container (SHARPS-A-GATOR DISPOSAL SYSTEM) Misc Use as directed for sharps disposal 1 each 2    ergocalciferol-1,250 mcg, 50,000 unit, (DRISDOL) 1,250 mcg (50,000 unit) capsule Take 1 capsule (1,250 mcg total) by mouth once a week. 12 capsule 1    evolocumab (REPATHA SURECLICK) 140 mg/mL PnIj Inject the contents of one pen (140 mg) under the skin every fourteen (14) days. 2 mL 11    fluticasone propionate (FLONASE) 50 mcg/actuation nasal spray 2 sprays into each nostril in the morning. 16 g 5    glipiZIDE (GLUCOTROL) 5 MG tablet TAKE 1 TABLET (5 MG TOTAL) BY MOUTH IN THE MORNING. FOR DIABETES.. 30 tablet 1    isosorbide mononitrate (IMDUR) 60 MG 24 hr tablet Take 1 tablet (60 mg total) by mouth daily. (Patient taking differently: Take 30 mg by mouth daily.) 30 tablet 11    metoprolol succinate (TOPROL-XL) 100 MG 24 hr tablet TAKE 2 TABLETS BY MOUTH EVERY DAY 180 tablet 1    miscellaneous medical supply Misc Glucometer  Dx: E11.9 1 each 0    miscellaneous medical supply Misc Glucometer test strips   Dx: E11.9 50 each 5    miscellaneous medical supply Misc Lancets  Dx  E11.9 100 each 1    nitroglycerin (NITROLINGUAL) 0.4 mg/dose spray Place 1 spray under the tongue every five (5) minutes as needed for chest pain. (Patient not taking: Reported on 07/31/2021)      nitroglycerin (NITROSTAT) 0.4 MG SL tablet Place 1 tablet (0.4 mg total) under the tongue every five (5) minutes as needed for chest pain. Maximum of 3 doses in 15 minutes. 100 tablet 3    omeprazole (PRILOSEC) 10 MG capsule TAKE 1 CAPSULE BY MOUTH EVERY DAY PLUS 20 MG TO EQUAL 30 MG FOR TAPERING OFF. 90 capsule 1    omeprazole (PRILOSEC) 20 MG capsule Take 1 capsule (20 mg total) by  mouth Two (2) times a day. 180 capsule 1    ONETOUCH VERIO TEST STRIPS Strp USE TO CHECK BLOOD SUGAR 1-2 TIMES PER DAY 100 strip 1    pregabalin (LYRICA) 50 MG capsule Take 1 capsule (50 mg total) by mouth Two (2) times a day. (Patient taking differently: Take 1 capsule (50 mg total) by mouth Two (2) times a day. Once daily; needs refill) 60 capsule 5    tiZANidine (ZANAFLEX) 4 MG tablet Take 1 tablet (4 mg total) by mouth every six (6) hours as needed. (Patient not taking: Reported on 07/31/2021) 30 tablet 0    traZODone (DESYREL) 50 MG tablet Take 1 tablet (50 mg total) by mouth nightly. 90 tablet 0     No current facility-administered medications for this visit.        Changes to medications: Sparsh reports no changes at this time.    Allergies   Allergen Reactions    Escitalopram Oxalate Hives, Itching and Nausea And Vomiting    Ace Inhibitors     Statins-Hmg-Coa Reductase Inhibitors Muscle Pain       Changes to allergies: No    SPECIALTY MEDICATION ADHERENCE     Repatha 140 mg/ml: 0 days of medicine on hand     Medication Adherence    Patient reported X missed doses in the last month: 0  Specialty Medication: Repatha 140mg /ml  Patient is on additional specialty medications: No          Specialty medication(s) dose(s) confirmed: Regimen is correct and unchanged.     Are there any concerns with adherence? No    Adherence counseling provided? Not needed    CLINICAL MANAGEMENT AND INTERVENTION      Clinical Benefit Assessment:    Do you feel the medicine is effective or helping your condition? Yes    Clinical Benefit counseling provided? Not needed    Adverse Effects Assessment:    Are you experiencing any side effects? No    Are you experiencing difficulty administering your medicine? No    Quality of Life Assessment:           How many days over the past month did your hyperlipidemia  keep you from your normal activities? For example, brushing your teeth or getting up in the morning. 0    Have you discussed this with your provider? Not needed    Acute Infection Status:    Acute infections noted within Epic:  No active infections  Patient reported infection: None    Therapy Appropriateness:    Is therapy appropriate and patient progressing towards therapeutic goals? Yes, therapy is appropriate and should be continued    DISEASE/MEDICATION-SPECIFIC INFORMATION      For patients on injectable medications: Patient currently has 0 doses left.  Next injection is scheduled for 08/16/21.    PATIENT SPECIFIC NEEDS     Does the patient have any physical, cognitive, or cultural barriers? No    Is the patient high risk? No    Does the patient require a Care Management Plan? No     SOCIAL DETERMINANTS OF HEALTH     At the [facility], we have learned that life circumstances - like trouble affording food, housing, utilities, or transportation can affect the health of many of our patients.   That is why we wanted to ask: are you currently experiencing any life circumstances that are negatively impacting your health and/or quality of life? Patient declined to answer    Social  Determinants of Health     Financial Resource Strain: Not on file   Internet Connectivity: Not on file   Food Insecurity: Not on file   Tobacco Use: Medium Risk    Smoking Tobacco Use: Former    Smokeless Tobacco Use: Never    Passive Exposure: Not on file   Housing/Utilities: Not on file   Alcohol Use: Not At Risk    How often do you have a drink containing alcohol?: Never    How many drinks containing alcohol do you have on a typical day when you are drinking?: 1 - 2    How often do you have 5 or more drinks on one occasion?: Never   Transportation Needs: No Transportation Needs    Lack of Transportation (Medical): No    Lack of Transportation (Non-Medical): No   Substance Use: Not on file   Health Literacy: Not on file   Physical Activity: Not on file   Interpersonal Safety: Not on file   Stress: Not on file   Intimate Partner Violence: Not on file   Depression: Not on file   Social Connections: Not on file       Would you be willing to receive help with any of the needs that you have identified today? Not applicable       SHIPPING     Specialty Medication(s) to be Shipped:   General Specialty: Repatha    Other medication(s) to be shipped: No additional medications requested for fill at this time     Changes to insurance: No    Delivery Scheduled: Yes, Expected medication delivery date: 08/08/21.     Medication will be delivered via Same Day Courier to the confirmed prescription address in [facility].    The patient will receive a drug information handout for each medication shipped and additional FDA Medication Guides as required.  Verified that patient has previously received a Conservation officer, historic buildings and a Surveyor, mining.    The patient or caregiver noted above participated in the development of this care plan and knows that they can request review of or adjustments to the care plan at any time.      All of the patient's questions and concerns have been addressed.    Tera Helper   [facility] Specialty Pharmacist

## 2021-08-05 ENCOUNTER — Ambulatory Visit: Admit: 2021-08-05 | Discharge: 2021-08-06 | Payer: MEDICARE

## 2021-08-06 ENCOUNTER — Institutional Professional Consult (permissible substitution): Admit: 2021-08-06 | Discharge: 2021-08-07 | Payer: MEDICARE

## 2021-08-06 NOTE — Unmapped (Signed)
Abstraction Result Flowsheet Data    This patient's last AWV date: [facility] Last Medicare Wellness Visit Date: 12/09/2017  This patients last WCC/CPE date: : Not Found      Reason for Encounter  Reason for Encounter: Outreach  Primary Reason for Outreach: AWV  Text Message: No  MyChart Message: Yes  Outreach Call Outcome: Left message

## 2021-08-06 NOTE — Unmapped (Unsigned)
[facility]  [email or web address]    ASSESSMENT:     Jesus Rubio is a 76 y.o. male who is seen for evaluation of post-COVID-19 recovery needs.  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    At this time, there are no definitive treatments for Post-COVID Conditions.  However, with time and appropriate symptom management approaches, most patients experience substantial improvements.    Recommendations are provided below, which can be implemented by the patient's primary care provider and/or existing care team as they find to be appropriate.    PLAN RECOMMENDATIONS:     ***Multi D clinic recommendation.  Remove if not recommending Multi D  Due to the complexity of this patient's case, he has been asked to return for further evaluation in the Multidisciplinary Clinic.  During this panel visit, he will be seen by PM&R, Internal Medicine, Psychiatry, Neuropsychology, Physical Therapy, & Occupational Therapy.  A team meeting will be held to develop a coordinated plan of care recommendations to be communicated back to the primary care provider.  Key symptoms, medical issues, and functional concerns to be addressed by the multidisciplinary team include: ***.    Fatigue with post-exertional malaise:  Persistent fatigue is the most common post-COVID condition symptom.  Jesus Rubio was provided education and reassurance, including that many people with post-COVID fatigue experience improvement with time and appropriate management.  Educational handouts regarding post-COVID fatigue will be provided.  {CRC-WORKUP-FATIGUE:92685}  {CRC-TREATMENT-FATIGUEPEM:92715::The patient has characteristics of post-exertional malaise (PEM).  PEM can complicate recovery, as patients can experience worsening of symptoms following even minor physical, cognition, or emotional exertion, with symptoms typically worsening 12 to 48 hours after activity and lasting for days or even weeks.  Patients often experience a push and crash phenomenon.,Patients with PEM respond poorly to a traditional graded exercise program.  Instead, fatigue management strategies are strongly recommended, including: (1) activity Planing, Prioritizing, Pacing, and Positioning (The 4 Ps), (2) monitoring their energy budget, (3) and performing activity and symptom tracking.,If the fatigue does not improve sufficiently with the recommendations listed above, the patient may benefit from prescribed medication to improve energy.  Potential options to be considered by the patient's care team include low-dose naltrexone, activating antidepressants (bupropion, atomoxetine), or stimulants (methylphenidate, amphetamine-dextroamphetamine, modafanil).}    Fatigue:  Persistent fatigue is the most common post-COVID condition symptom.  Jesus Rubio was provided education and reassurance, including that many people with post-COVID fatigue experience improvement with time and appropriate management.  Educational handouts regarding post-COVID fatigue will be provided.  {CRC-WORKUP-FATIGUE:92685}  {CRC-TREATMENT-FATIGUE:92716::I strongly recommend energy conservation strategies, including (1) activity Planing, Prioritizing, Pacing, and Positioning (The 4 Ps), (2) monitoring the energy budget, (3) and performing activity and symptom tracking.,The patient was encouraged to slowly increase their activity level.  One approach is called the Rule of 10s in which the exercise time and the intensity is increased by 10% every 10 days.  Activity monitoring, such as with a heart rate/step tracker and/or the Borg Rating of Perceived Exertion Scale (0 - 10 scale) is recommended.,If the fatigue does not improve sufficiently with the recommendations listed above, the patient may benefit from prescribed medication to improve energy.  Potential options to be considered by the patient's care team include low-dose naltrexone, activating antidepressants (bupropion, atomoxetine), or stimulants (methylphenidate, amphetamine-dextroamphetamine, modafanil).}    Brain fog:  Many patients recovering from COVID report transient or lasting cognitive dysfunction. Reported deficits in attention, executive functioning, language, processing speed, and memory are commonly  collectively referred to as brain fog.  Jesus Rubio was provided a handout with strategies to improve cognitive efficiency, including managing mental fatigue, writing things down, supporting their focus, and organizing the day.  {CRC-TREATMENT-BRAINFOG:92714::I recommend the patient be referred for outpatient speech therapy for cognitive rehabilitation due to ongoing memory and attention issues.,If cognitive symptoms persist, I would recommend a neuropsychology evaluation.  This assessment and testing would provide valuable, objective information on impaired cognitive domains as well as recommendations for recovery.,If the brain fog does not improve sufficiently with the recommendations listed above, the patient may benefit from prescribed medication to improve attention and focus.  Potential options to be considered by the patient's care team include low-dose naltrexone, activating antidepressants (bupropion, atomoxetine), alpha-2 agonists (guanfacine) or stimulants (methylphenidate, amphetamine-dextroamphetamine, modafanil).}    Dyspnea:  Patient education was provided both during the visit and through handouts.  {CRC-WORKUP-DYSPNEA:92711}  {CRC-TREATMENT-DYSPNEA:92713}    Dysautonomia:  Dysautonomia is an term used to describe several different medical conditions that cause an improper functioning of the Autonomic Nervous System.  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  {CRC-DYSAUTONOMIA:93494::I recommend the patient perform lifestyle Anxiety, Arthritis, At risk for falls, B12 deficiency, CHF (congestive heart failure) (CMS-HCC), Coronary artery disease, Diabetes mellitus (CMS-HCC), Financial difficulties, Hearing impairment, Heart disease, Hyperlipemia, Hypertension, Joint pain, Major depressive disorder, Peptic ulceration, PONV (postoperative nausea and vomiting), and Renal disorder.    Past Surgical History:  He  has a past surgical history that includes Replacement total knee; Cholecystectomy; Thyroidectomy, partial; Prostate surgery; Foot arthrodesis, triple; Cardiac surgery (2006); Joint replacement; pr upper gi endoscopy,biopsy (N/A, 12/01/2013); pr compre ep eval abltj 3d mapg tx svt (N/A, 05/16/2015); Knee arthroscopy; pr colonoscopy flx dx w/collj spec when pfrmd (N/A, 04/03/2020); pr gi imag intraluminal esophagus-ileum w/i&r (N/A, 04/05/2020); pr cath place/coron angio, img super/interp,w left heart ventriculography (N/A, 05/03/2021); and pr revascularize fem/pop artery,angioplasty/stent (N/A, 06/03/2021).    Family History:  His family history includes Cancer in his brother, brother, brother, father, mother, sister, and sister; Depression in his mother; Heart attack in his brother, father, and mother; Lung cancer in his brother.    Review of systems:   All systems reviewed and negative other than noted elsewhere in this document.    Post-COVID-19 Symptom Review and Effects (Current Symptoms)  Fatigue-Related    Fatigue?   Yes.   Sleep disturbance? Having issues despite medication.       Cardiopulmonary    Shortness of breath? Comes with the territory.   Cough? No.   Palpitations? No.   Chest pain? No.       Neurologic    Cognitive impairment (brain fog)? Yes.  Worsened since COVID.   Headaches? Yes.  Intermittent.   Numbness or tingling?    Loss of smell or taste? Yes.   Ringing in the ears?    Dizziness?        Other concerns? Feeling afraid frequently.        PHQ-9 Score:  PHQ-9 PHQ-9 TOTAL SCORE   11/19/2020   8:00 AM 3 considering the following approaches trialed by the referring provider to achieve symptom relief.  }    Pain disorder:  The patient's significant pain is a limiting factor in his recovery.  Pain disorders frequently occur after COVID illness.  {CRC-PAIN:92781::I recommend considering the following approaches trialed by the primary provider to achieve symptom relief.  }    Headache disorder:  Persistent headaches are a  common occurrence after COVID-19 illness.  Headaches may represent a new syndrome or exacerbation of an existing headache disorder.  {CRC-HEADACHE:92780}    Mental health:  Mental health concerns, such as anxiety, depression, and stress disorder, are often seen in people recovering from COVID.  Sometimes these symptoms can be interconnected with physical ones.    It is important to note that the patient's symptom constellation is not secondary to a mental health diagnosis.  However, optimal overall recovery is dependent on managing any mental health concerns in conjunction with physical and cognitive ones.  {CRC-MENTALHEALTH:92778}     Sleep Disturbance:   Impaired sleep is common after COVID illness.  Sleep issues can contribute to the symptom post-COVID constellation, particularly fatigue and brain fog.  {CRC-SLEEP:93468::We reviewed the importance of sleep for recovery.  Patient was provided with sleep hygiene strategies.}    Nutrition:  {CRC-NUTRITION:93452::I recommend a Mediterranean diet with healthy foods like whole grains, fruits, vegetables, seafood, beans, and nuts.  The patient should minimize use of alcohol and avoid tobacco.  A good resource on healthy, anti-inflammatory diet and lifestyle is provided by the [employer]: fammed.PromotionalReview.nl.pdf}    Tinnitus:  Provided recommendations to use a sound masking device such as tabletop white noise machine, sound pillow, or headphones.  Referral provided to has agreed to be treated using telemedicine. The patient's/patient's family's questions regarding telemedicine have been answered.     If the visit was completed in an ambulatory setting, the patient and/or parent/guardian has also been advised to contact their provider???s office for worsening conditions, and seek emergency medical treatment and/or call 911 if the patient deems either necessary. communicated to the primary care provider who will coordinate the patient's further care.    SUBJECTIVE:     Chief complaint: Evaluation of post-COVID-19 recovery needs.    History of present Illness:  Jesus Rubio is a 76 y.o. male seen in consultation at the request of Kristopher Erlene Senters* for evaluation of post-COVID-19 recovery needs.    Medications:  Jesus Rubio has a current medication list which includes the following prescription(s): acetaminophen, albuterol, apixaban, blood-glucose meter, clopidogrel, cyanocobalamin (vitamin b-12), diclofenac sodium, diltiazem, empty container, ergocalciferol-1,250 mcg (50,000 unit), repatha sureclick, fluticasone propionate, glipizide, isosorbide mononitrate, metoprolol succinate, miscellaneous medical supply, miscellaneous medical supply, miscellaneous medical supply, nitroglycerin, nitroglycerin, omeprazole, omeprazole, onetouch verio test strips, pregabalin, tizanidine, and trazodone.    Allergies:  He is allergic to escitalopram oxalate, ace inhibitors, and statins-hmg-coa reductase inhibitors.    Past Medical History:  He  has a past medical history of Alcoholism (CMS-HCC), Anxiety, Arthritis, At risk for falls, B12 deficiency, CHF (congestive heart failure) (CMS-HCC), Coronary artery disease, Diabetes mellitus (CMS-HCC), Financial difficulties, Hearing impairment, Heart disease, Hyperlipemia, Hypertension, Joint pain, Major depressive disorder, Peptic ulceration, PONV (postoperative nausea and vomiting), and Renal disorder.    Past Surgical History:  He  has a past surgical history that includes Replacement total knee; Cholecystectomy; Thyroidectomy, partial; Prostate surgery; Foot arthrodesis, triple; Cardiac surgery (2006); Joint replacement; pr upper gi endoscopy,biopsy (N/A, 12/01/2013); pr compre ep eval abltj 3d mapg tx svt (N/A, 05/16/2015); Knee arthroscopy; pr colonoscopy flx dx w/collj spec when pfrmd (N/A, 04/03/2020); pr gi imag intraluminal esophagus-ileum w/i&r (N/A, 04/05/2020); pr cath place/coron angio, img super/interp,w left heart ventriculography (N/A, 05/03/2021); and pr revascularize fem/pop artery,angioplasty/stent (N/A, 06/03/2021).    Family History:  His family history includes Cancer in his brother, brother, brother, father, mother, sister, and sister; Depression in his mother; Heart attack in his brother,  father, and mother; Lung cancer in his brother.    Review of systems:   All systems reviewed and negative other than noted elsewhere in this document.    COVID-19 Case History  Confirmed COVID-19 infection?   {YES/NO:21013}   Date of onset? ***   Hospitalized? {YES/NO:21013}   Comments?           Post-COVID-19 Symptom Review and Effects (Current Symptoms)  Fatigue-Related    Fatigue?   Yes.   Sleep disturbance? Having issues despite medication.       Cardiopulmonary    Shortness of breath? Comes with the territory.   Cough? No.   Palpitations? No.   Chest pain? No.       Neurologic    Cognitive impairment (brain fog)? Yes.  Worsened since COVID.   Headaches? Yes.  Intermittent.   Numbness or tingling? {YES/NO:21013}   Loss of smell or taste? Yes.   Ringing in the ears? {YES/NO:21013}   Dizziness? {YES/NO:21013}       Other concerns? Feeling afraid frequently.        PHQ-9 Score:  PHQ-9 PHQ-9 TOTAL SCORE   11/19/2020   8:00 AM 3   08/17/2020   4:00 PM 21   05/23/2020   3:00 PM 9   03/07/2020   9:00 AM 20   11/28/2019  10:00 AM 16         Mini [employer]  Memory/Attention:    Read list of words, subject must repeat them.  2 trials even if first trial is successful     Coat      Notebook      Juice      Violin      Square    1:   []     2:   []     No points   Language Fluency:    Name a maximum number of words in one minute that begin with the letter T []  0-2 words:  0 points  []  3-5 words:  1 point  []  6-9 words:  2 points  []  10-13 words:  3 points  []  14 words or more:  4 points     ***/4   Orientation   []  Date    []  Month     []  Year    []  Day []  Location     []  City     ***/6   Recall  (MUST RECALL WITH NO CLUES) []  Coat    []  Notebook     []  Juice     []  Violin   []  Square   ***/5     Total Score: ***/15  (Normal ?11)       OBJECTIVE:     {CRC-EXAM-TYPE:93469}     Diagnostic Studies:     Recent results reviewed in {CRCDIAGNOSTICLOCATION:95322}    Sed Rate   Date Value Ref Range Status   06/10/2018 15 0 - 20 mm/h Final   02/15/2014 30 (H) 0 - 20 Final     CRP   Date Value Ref Range Status   03/12/2020 57.0 (H) <=10.0 mg/L Final     Creatine Kinase, Total   Date Value Ref Range Status   07/05/2021 48.0 45.0 - 250.0 U/L Final   07/13/2014 71 70 - 185 U/L Final     Folate   Date Value Ref Range Status   12/24/2020 21.3 >=5.4 ng/mL Final     Iron   Date Value Ref Range Status   12/24/2020 78  37 - 170 ug/dL Final     TIBC   Date Value Ref Range Status   12/24/2020 435 252 - 479 ug/dL Final     D-Dimer   Date Value Ref Range Status   07/29/2020 622 (H) <=500 ng/mL FEU Final     Ferritin   Date Value Ref Range Status   12/24/2020 12.9 10.5 - 307.3 ng/mL Final     Vitamin D Total (25OH)   Date Value Ref Range Status   02/13/2021 53.2 20.0 - 80.0 ng/mL Final     Vitamin B-12   Date Value Ref Range Status   12/24/2020 536 211 - 911 pg/ml Final   07/06/2014 549 211 - 946 PG/ML Final       ***Disclaimer for televisit.  Remove if in-person visit  {    Coding tips - Do not edit this text, it will delete upon signing of note!    Telephone visits [phone] for Physicians and APP???s and [phone] for Non- Physician Clinicians)- Only use minutes on the phone to determine level of service.    Video visits [phone]) - Use both minutes on video and pre/post minutes to determine level of service.       :75688}  The patient reports they are currently: {patient location:81390}. I spent *** minutes on the {phone audio video visit:67489} with the patient on the date of service. I spent an additional *** minutes on pre- and post-visit activities on the date of service. The patient was physically located in [place] or a state in which I am permitted to provide care. The patient and/or parent/guardian understood that s/he may incur co-pays and cost sharing, and agreed to the telemedicine visit. The visit was reasonable and appropriate under the circumstances given the patient's presentation at the time.    The patient and/or parent/guardian has been advised of the potential risks and limitations of this mode of treatment (including, but not limited to, the absence of in-person examination) and has agreed to be treated using telemedicine. The patient's/patient's family's questions regarding telemedicine have been answered.     If the visit was completed in an ambulatory setting, the patient and/or parent/guardian has also been advised to contact their provider???s office for worsening conditions, and seek emergency medical treatment and/or call 911 if the patient deems either necessary.

## 2021-08-08 ENCOUNTER — Ambulatory Visit: Admit: 2021-08-08 | Discharge: 2021-08-09 | Payer: MEDICARE | Attending: Family Medicine | Primary: Family Medicine

## 2021-08-08 DIAGNOSIS — I2510 Atherosclerotic heart disease of native coronary artery without angina pectoris: Principal | ICD-10-CM

## 2021-08-08 DIAGNOSIS — I259 Chronic ischemic heart disease, unspecified: Principal | ICD-10-CM

## 2021-08-08 DIAGNOSIS — I959 Hypotension, unspecified: Principal | ICD-10-CM

## 2021-08-08 DIAGNOSIS — K219 Gastro-esophageal reflux disease without esophagitis: Principal | ICD-10-CM

## 2021-08-08 DIAGNOSIS — G4700 Insomnia, unspecified: Principal | ICD-10-CM

## 2021-08-08 MED ORDER — ISOSORBIDE MONONITRATE ER 30 MG TABLET,EXTENDED RELEASE 24 HR
ORAL_TABLET | Freq: Every day | ORAL | 1 refills | 90 days | Status: CP
Start: 2021-08-08 — End: 2022-08-08

## 2021-08-08 MED ORDER — TRAZODONE 50 MG TABLET
ORAL_TABLET | Freq: Every evening | ORAL | 1 refills | 45 days | Status: CP
Start: 2021-08-08 — End: ?

## 2021-08-08 MED FILL — REPATHA SURECLICK 140 MG/ML SUBCUTANEOUS PEN INJECTOR: SUBCUTANEOUS | 28 days supply | Qty: 2 | Fill #1

## 2021-08-08 NOTE — Unmapped (Signed)
Health Maintenance Due   Topic Date Due    COVID-19 Vaccine (1) Never done    Zoster Vaccines (1 of 2) Never done    DTaP/Tdap/Td Vaccines (1 - Tdap) 04/08/2002           Goals & Recommendations:    Goals and Recommendations related to your health are outlined below.    If discussed and provided at your visit today: Please, keep and record your blood sugar, blood pressure, weight, fluid intake, dietary intake and exercise amount and frequency.     Blood Pressure Goals (unless otherwise instructed):  Check 2-3 times a week.  Typical average out of office measurement: <135 mmHg systolic and < 85 mmHg diastolic (less than 135/85).  More strict recommendations for out of office measurement: < 130 mmHg systolic and < 80 mmHg diastolic (less than 130/80).  Typical in office measurement <140 mmHg systolic and <90 mmHg diastolic (less than 140/90).  Most people will not feel well if blood pressure drops to less than 110/70, unless they typically have blood pressures in this range.   If you are noticing sudden increases or decreases in blood pressure, then please contact the office.    Diabetes: Goal Hemoglobin A1c 7.9 or lower.  Goal Blood Sugars Fasting: 60-140 (may check fasting and two hours after a meal later in the day).    For Monitoring your Mood your PHQ-9 Goal is less than 10.    Drink 48 ounces of water per day.  Avoid sweetened beverages.  You will generally feel better and experience improved health when you limit, and if possible avoid, processed foods.  Diets high in plant based foods, vegetables and fruits are associated with many health benefits.    Exercise for a minimum of 20 minutes daily. Minimum of four days a week.   If possible try to obtain 60 minutes of exercise daily for five or more days per week.  Regular movement throughout the day is best.   Walking is a good form of movement.   Squats, lunges, step-ups, push-ups, planks, and arm exercises with or without weights or resistance bands are good forms of exercise.    If your BMI (body mass index) is above 25 then your body is carrying more weight than is typically healthy.  If your BMI (body mass index) is above 25 and you are not achieving the healthy weight goals that you desire, then consider the following:  Keep calories below 2500 calories per day unless otherwise instructed. Remember that all calories are not equal.  Keep a food journal and record accurately.  Read labels and ingredient lists on the products that you consume.  Sometimes we need to adjust your level of physical activity, and or tailor an exercise program more specifically for your needs, just ask and we will come up with a plan.  If you have questions about any of these recommendations, please ask.    If you smoke, stop.  If you have heart failure or severe kidney disease: weigh yourself daily and maintain fluid intake to less than 2 Liters daily unless otherwise instructed.  If you have diabetes you need to see the eye doctor at least every one to two years and have a yearly foot exam.  Set aside time daily for relaxation.  Plan a pleasant activity that you can look forward to and enjoy.  Medication changes are outlined on your After Visit Summary.  Any referrals placed today will be outlined on your After  Visit Summary.    If you have questions, please call the office or use MyChart.   If you need a refill on a medication, please contact your pharmacy.  Please, allow at least two business days for responses to refill requests.    Please, bring all medications and when possible original prescription containers to each and every office visit.    If you have been provided and / or requested to record your blood sugars, blood pressures, weight, fluid intake, dietary intake, or exercise, then please bring records to each and every appointment.    When you see other providers for health care, please request that they send information and records related to your visit to our office. This includes vaccinations, mammograms, radiology, procedures, diagnostic testing, lab testing and physical exams or wellness exams that you have outside of the [facility].      [facility]  [address], [address]  [place], [place] [address]  Office: [phone]  Fax: [phone]             Learning About Mindfulness for Stress  What are mindfulness and stress?    Stress is what you feel when you have to handle more than you are used to. A lot of things can cause stress. You may feel stress when you go on a job interview, take a test, or run a race. This kind of short-term stress is normal and even useful. It can help you if you need to work hard or react quickly.  Stress also can last a long time. Long-term stress is caused by stressful situations or events. Examples of long-term stress include long-term health problems, ongoing problems at work, and conflicts in your family. Long-term stress can harm your health.  Mindfulness is a focus only on things happening in the present moment. It's a process of purposefully paying attention to and being aware of your surroundings, your emotions, your thoughts, and how your body feels. You are aware of these things, but you aren't judging these experiences as good or bad. Mindfulness can help you learn to calm your mind and body to help you cope with illness, pain, and stress.  How does mindfulness help to relieve stress?  Mindfulness can help quiet your mind and relax your body. Studies show that it can help some people sleep better, feel less anxious, and bring their blood pressure down. And it's been shown to help some people live and cope better with certain health problems like heart disease, depression, chronic pain, and cancer.  How do you practice mindfulness?  To be mindful is to pay attention, to be present, and to be accepting.  When you're mindful, you do just one thing and you pay close attention to that one thing. For example, you may sit quietly and notice your emotions or how your food tastes and smells.  When you're present, you focus on the things that are happening right now. You let go of your thoughts about the past and the future. When you dwell on the past or the future, you miss moments that can heal and strengthen you. You may miss moments like hearing a child laugh or seeing a friendly face when you think you're all alone.  When you're accepting, you don't judge the present moment. Instead you accept your thoughts and feelings as they come.  You can practice anytime, anywhere, and in any way you choose. You can practice in  many ways. Here are a few ideas:  While doing your chores, like washing the dishes, let your mind focus on what's in your hand. What does the dish feel like? Is the water warm or cold?  Go outside and take a few deep breaths. What is the air like? Is it warm or cold?  When you can, take some time at the start of your day to sit alone and think.  Take a slow walk by yourself. Count your steps while you breathe in and out.  Try yoga breathing exercises, stretches, and poses to strengthen and relax your muscles.  At work, if you can, try to stop for a few moments each hour. Note how your body feels. Let yourself regroup and let your mind settle before you return to what you were doing.  If you struggle with anxiety or worry thoughts, imagine your mind as a blue sky and your worry thoughts as clouds. Now imagine those worry thoughts floating across your mind's sky. Just let them pass by as you watch.  Follow-up care is a key part of your treatment and safety. Be sure to make and go to all appointments, and call your doctor if you are having problems. It's also a good idea to know your test results and keep a list of the medicines you take.  Where can you learn more?  Go to [facility] at [email or web address]  Select Preferences in the upper right hand corner, then select Health Library under Resources. Enter [phone] in the search box to learn more about Learning About Mindfulness for Stress.  Current as of: January 15, 2016  Content Version: 11.6  ?? 2006-2018 [employer]. Care instructions adapted under license by [facility]. If you have questions about a medical condition or this instruction, always ask your healthcare professional. [employer], Inc[employer] disclaims any warranty or liability for your use of this information.         Learning About Guided Imagery for Stress  What are guided imagery and stress?    Stress is what you feel when you have to handle more than you are used to. A lot of things can cause stress. You may feel stress when you go on a job interview, take a test, or run a race. This kind of short-term stress is normal and even useful. It can help you if you need to work hard or react quickly.  Stress also can last a long time. Long-term stress is caused by stressful situations or events. Examples of long-term stress include long-term health problems, ongoing problems at work, and conflicts in your family. Long-term stress can harm your health.  Guided imagery is a technique of directed thoughts and suggestions that guide your mind toward a relaxed, focused state. This technique helps you use your mind to take you to a calm, peaceful place. You can use it to relax, which can lower blood pressure and reduce other problems related to stress.  How does guided imagery help to relieve stress?  Because of the way the mind and body are connected, guided imagery can make you feel like you are experiencing something just by imagining it. You can achieve a relaxed state when you imagine all the details of a safe, comfortable place, such as a beach or a garden. This relaxed state may help you feel more in control of your emotions and thought processes. Feeling in control may improve your attitude, health, and sense of well-being.  How do you do guided imagery?  You can use a smartphone app or a video to lead you through the process. You use all of your senses in guided imagery. For example, if you want a tropical setting, you can imagine the warm breeze on your skin, the bright blue of the water, the sound of the surf, the sweet scent of tropical flowers, and the taste of coconut. Imagining those things can make you actually feel like you're there.  But you don't have to imagine the tropics to feel peace. Guided imagery can take you to your own restful place. To give guided imagery a try, follow these steps:  Lean back comfortably in your chair. If you can, close your eyes. Put your arms on the armrests, or fold your hands in your lap.  Take a deep breath through your nose. Breathe in slowly, and then let the air out completely through your mouth.  Do it again slowly. Keep breathing like this. Gather up any tension in your body, and send it out with every breath.  Let a feeling of warmth spread from your lungs to your neck and head, down your arms to your fingertips, through your body and into your legs, all the way down to your toes. Stay this way for a moment.  Now imagine a pleasant day. You're at a park or at a place you've visited in the past where you felt at peace.  In your mind's eye, look at what lies in front of you. Maybe you see the sun, filtered through trees. Maybe clouds are drifting by.  Look to one side, and then the other. Notice the feel of the air around you. Notice how it feels on your face and on your arms.  Stay here for a while. Let it become real for you.  Follow-up care is a key part of your treatment and safety. Be sure to make and go to all appointments, and call your doctor if you are having problems. It's also a good idea to know your test results and keep a list of the medicines you take.  Where can you learn more?  Go to [facility] at [email or web address]  Select Preferences in the upper right hand corner, then select Health Library under Resources. Enter N291 in the search box to learn more about Learning About Guided Imagery for Stress.  Current as of: January 15, 2016  Content Version: 11.6  ?? 2006-2018 [employer]. Care instructions adapted under license by [facility]. If you have questions about a medical condition or this instruction, always ask your healthcare professional. [employer], Incorporated disclaims any warranty or liability for your use of this information.        Learning About the Mediterranean Diet  What is the Mediterranean diet?    The Mediterranean diet is a style of eating rather than a diet plan. It features foods eaten in [place], [place], southern [place], and other countries along the [employer]. It emphasizes eating foods like fish, fruits, vegetables, beans, high-fiber breads and whole grains, nuts, and olive oil. This style of eating includes limited red meat, cheese, and sweets.  Why choose the Mediterranean diet?  A Mediterranean-style diet may improve heart health. It contains more fat than other heart-healthy diets. But the fats are mainly from nuts, unsaturated oils (such as fish oils and olive oil), and certain nut or seed oils (such as canola, soybean, or flaxseed oil). These fats may help protect the heart and blood vessels.  How can you get started  on the Mediterranean diet?  Here are some things you can do to switch to a more Mediterranean way of eating.  What to eat  Eat a variety of fruits and vegetables each day, such as grapes, blueberries, tomatoes, broccoli, peppers, figs, olives, spinach, eggplant, beans, lentils, and chickpeas.  Eat a variety of whole-grain foods each day, such as oats, brown rice, and whole wheat bread, pasta, and couscous.  Eat fish at least 2 times a week. Try tuna, salmon, mackerel, lake trout, herring, or sardines.  Eat moderate amounts of low-fat dairy products, such as milk, cheese, or yogurt.  Eat moderate amounts of poultry and eggs.  Choose healthy (unsaturated) fats, such as nuts, olive oil, and certain nut or seed oils like canola, soybean, and flaxseed.  Limit unhealthy (saturated) fats, such as butter, palm oil, and coconut oil. And limit fats found in animal products, such as meat and dairy products made with whole milk. Try to eat red meat only a few times a month in very small amounts.  Limit sweets and desserts to only a few times a week. This includes sugar-sweetened drinks like soda.  The Mediterranean diet may also include red wine with your meal--1 glass each day for women and up to 2 glasses a day for men.  Tips for eating at home  Use herbs, spices, garlic, lemon zest, and citrus juice instead of salt to add flavor to foods.  Add avocado slices to your sandwich instead of bacon.  Have fish for lunch or dinner instead of red meat. Brush the fish with olive oil, and broil or grill it.  Sprinkle your salad with seeds or nuts instead of cheese.  Cook with olive or canola oil instead of butter or oils that are high in saturated fat.  Switch from 2% milk or whole milk to 1% or fat-free milk.  Dip raw vegetables in a vinaigrette dressing or hummus instead of dips made from mayonnaise or sour cream.  Have a piece of fruit for dessert instead of a piece of cake. Try baked apples, or have some dried fruit.  Tips for eating out  Try broiled, grilled, baked, or poached fish instead of having it fried or breaded.  Ask your server to have your meals prepared with olive oil instead of butter.  Order dishes made with marinara sauce or sauces made from olive oil. Avoid sauces made from cream or mayonnaise.  Choose whole-grain breads, whole wheat pasta and pizza crust, brown rice, beans, and lentils.  Cut back on butter or margarine on bread. Instead, you can dip your bread in a small amount of olive oil.  Ask for a side salad or grilled vegetables instead of french fries or chips.  Where can you learn more?  Go to [facility] at [email or web address]  Select Health Library under the Resources menu. Enter O407 in the search box to learn more about Learning About the Mediterranean Diet.  Current as of: February 11, 2017  Content Version: 12.2  ?? 2006-2019 [employer], [employer]. Care instructions adapted under license by [facility]. If you have questions about a medical condition or this instruction, always ask your healthcare professional. [employer], [employer] disclaims any warranty or liability for your use of this information.            Stretching: Exercises  Your Care Instructions  Here are some examples of exercises for stretching. Start each exercise slowly. Ease off the exercise if you start to have pain.  Your doctor or  physical therapist will tell you when you can start these exercises and which ones will work best for you.  How to do the exercises  Latissimus stretch    Stand with your back straight and your feet shoulder-width apart. You can do this stretch sitting down if you are not steady on your feet.  Hold your arms above your head, and hold one hand with the other.  Pull upward while leaning straight over toward your right side. Keep your lower body straight. You should feel the stretch along your left side.  Hold 15 to 30 seconds, and then switch sides.  Repeat 2 to 4 times for each side.  Triceps stretch    Stand with your back straight and your feet shoulder-width apart. You can do this stretch sitting down if you are not steady on your feet.  Bring your left elbow straight up while bending your arm.  Grab your left elbow with your right hand, and pull your left elbow toward your head with light pressure. If you are more flexible, you may pull your arm slightly behind your head. You will feel the stretch along the back of your arm.  Hold 15 to 30 seconds, and then switch elbows.  Repeat 2 to 4 times for each arm.  Calf stretch    Place your hands on a wall for balance. You can also do this with your hands on the back of a chair, a countertop, or a tree.  Step back with your left leg. Keep the leg straight, and press your left heel into the floor.  Press your hips forward, bending your right leg slightly. You will feel the stretch in your left calf.  Hold the stretch 15 to 30 seconds.  Repeat 2 to 4 times for each leg.  Quadriceps stretch    Lie on your side with one hand supporting your head.  Bend your upper leg back and grab your ankle with your other hand.  Stretch your leg back by pulling your foot toward your buttocks. You will feel the stretch in the front of your thigh. If this causes stress on your knees, do not do this stretch.  Hold the stretch 15 to 30 seconds.  Repeat 2 to 4 times for each leg.  Groin stretch    Sit on the floor and put the soles of your feet together. Do not slump your back.  Grab your ankles and gently pull your legs toward you.  Press your knees toward the floor. You will feel the stretch in your inner thighs.  Hold 15 to 30 seconds.  Repeat 2 to 4 times.  Hamstring stretch in doorway    Lie on the floor near a doorway, with your buttocks close to the wall.  Let the leg you are not stretching extend through the doorway.  Put the leg you want to stretch up on the wall, and straighten your knee to feel a gentle stretch at the back of your leg.  Hold the stretch for at least 15 to 30 seconds. Repeat 2 to 4 times.  Follow-up care is a key part of your treatment and safety. Be sure to make and go to all appointments, and call your doctor if you are having problems. It's also a good idea to know your test results and keep a list of the medicines you take.  Where can you learn more?  Go to [facility] at [email or web address]  Select Preferences in the upper right hand corner, then  select Health Library under Resources. Enter P733 in the search box to learn more about Stretching: Exercises.  Current as of: March 13, 2016  Content Version: 11.6  ?? 2006-2018 [employer], [employer]. Care instructions adapted under license by [facility]. If you have questions about a medical condition or this instruction, always ask your healthcare professional. [employer], [employer] disclaims any warranty or liability for your use of this information.       Patient Education        Preventing Falls: Care Instructions  Overview     Getting around your home safely can be a challenge if you have injuries or health problems that make it easy for you to fall. Loose rugs and furniture in walkways are among the dangers for many older people who have problems walking or who have poor eyesight. People who have conditions such as arthritis, osteoporosis, or dementia also have to be careful not to fall.  You can make your home safer with a few simple measures.  Follow-up care is a key part of your treatment and safety. Be sure to make and go to all appointments, and call your doctor if you are having problems. It's also a good idea to know your test results and keep a list of the medicines you take.  How can you care for yourself at home?  Taking care of yourself  Exercise regularly to improve your strength, muscle tone, and balance. Walk if you can. Swimming may be a good choice if you cannot walk easily.  Have your vision and hearing checked each year or any time you notice a change. If you have trouble seeing and hearing, you might not be able to avoid objects and could lose your balance.  Know the side effects of the medicines you take. Ask your doctor or pharmacist whether the medicines you take can affect your balance. Sleeping pills or sedatives can affect your balance.  Limit the amount of alcohol you drink. Alcohol can impair your balance and other senses.  Ask your doctor whether calluses or corns on your feet need to be removed. If you wear loose-fitting shoes because of calluses or corns, you can lose your balance and fall.  Talk to your doctor if you have numbness in your feet.  You may get dizzy if you do not drink enough water. To prevent dehydration, drink plenty of fluids. Choose water and other clear liquids. If you have kidney, heart, or liver disease and have to limit fluids, talk with your doctor before you increase the amount of fluids you drink.  Preventing falls at home  Remove raised doorway thresholds, throw rugs, and clutter. Repair loose carpet or raised areas in the floor.  Move furniture and electrical cords to keep them out of walking paths.  Use nonskid floor wax, and wipe up spills right away, especially on ceramic tile floors.  If you use a walker or cane, put rubber tips on it. If you use crutches, clean the bottoms of them regularly with an abrasive pad, such as steel wool.  Keep your house well lit, especially stairways, porches, and outside walkways. Use night-lights in areas such as hallways and bathrooms. Add extra light switches or use remote switches (such as switches that go on or off when you clap your hands) to make it easier to turn lights on if you have to get up during the night.  Install sturdy handrails on stairways.  Move items in your cabinets so that the things you  use a lot are on the lower shelves (about waist level).  Keep a cordless phone and a flashlight with new batteries by your bed. If possible, put a phone in each of the main rooms of your house, or carry a cell phone in case you fall and cannot reach a phone. Or, you can wear a device around your neck or wrist. You push a button that sends a signal for help.  Wear low-heeled shoes that fit well and give your feet good support. Use footwear with nonskid soles. Check the heels and soles of your shoes for wear. Repair or replace worn heels or soles.  Do not wear socks without shoes on smooth floors, such as wood.  Walk on the grass when the sidewalks are slippery. If you live in an area that gets snow and ice in the winter, sprinkle salt on slippery steps and sidewalks. Or ask a family member or friend to do this for you.  Preventing falls in the bath  Install grab bars and nonskid mats inside and outside your shower or tub and near the toilet and sinks.  Use shower chairs and bath benches.  Use a hand-held shower head that will allow you to sit while showering.  Get into a tub or shower by putting the weaker leg in first. Get out of a tub or shower with your strong side first.  Repair loose toilet seats and consider installing a raised toilet seat to make getting on and off the toilet easier.  Keep your bathroom door unlocked while you are in the shower.  Where can you learn more?  Go to [facility] at [email or web address] forces logistics/support/administrative officer in the Menu. Enter G117 in the search box to learn more about Preventing Falls: Care Instructions.  Current as of: February 13, 2021               Content Version: 13.6  ?? 2006-2023 [employer].   Care instructions adapted under license by [facility]. If you have questions about a medical condition or this instruction, always ask your healthcare professional. [employer] disclaims any warranty or liability for your use of this information.

## 2021-08-10 MED ORDER — ONETOUCH VERIO TEST STRIPS
ORAL_STRIP | 1 refills | 0 days
Start: 2021-08-10 — End: ?

## 2021-08-12 MED ORDER — ONETOUCH VERIO TEST STRIPS
ORAL_STRIP | 1 refills | 0 days | Status: CP
Start: 2021-08-12 — End: ?

## 2021-08-12 NOTE — Unmapped (Signed)
Assessment/Plan     Diagnoses and all orders for this visit:    Hypotension, unspecified hypotension type  Improved with reduction in isosorbide.  Standard education guidance.  Continue current plan of care.  Precautions reviewed.    Insomnia, unspecified type  Not at goal.  Amenable to increasing trazodone.  Precautions.  -     traZODone (DESYREL) 50 MG tablet; Take 2 tablets (100 mg total) by mouth nightly.    Coronary artery disease involving native coronary artery of native heart, Ischemic heart disease  Clinically asymptomatic.   Standard education and anticipatory guidance regarding goals, objectives, and sequelae.  Continue current management and plan of care.  Continue aggressive risk factor reduction.  -     isosorbide mononitrate (IMDUR) 30 MG 24 hr tablet; Take 1 tablet (30 mg total) by mouth daily.    Gastroesophageal reflux disease, unspecified whether esophagitis present  Standard education and anticipatory guidance regarding goals, objectives, and sequelae.  Omeprazole use reviewed.  Continue current plan of care.    Diagnoses and plan along with any newly prescribed medication(s), recommendations, orders and therapies were discussed in detail with patient today.   Standard education and anticipatory guidance regarding goals, objectives and potential sequelae were reviewed. Patient voiced appropriate understanding of assessment and plan of care.    Pertinent handouts were given today and reviewed with the patient as indicated.  The Care Plan and Self-Management goals have been included on the AVS and the AVS has been printed.  Where helpful for monitoring and management I have encouraged the patient to keep regular logs for me to review at their next visit. Any outside resources or referrals needed at this time are noted above. No additional referrals necessary at this time. Patient voiced understanding and all questions have been answered to satisfaction.     To complete this documentation in a timely manner, portions of this encounter may have been completed utilizing Dragon voice recognition software. Please take this into consideration in noting nuances of documentation, grammar, punctuation, and wording. If and when a question exists, or for clarification, please contact the author.    Barron Alvine, MD  [facility]  [address], [address]  [place], [name] [address]  Phone [phone]    Subjective:     NWG:NFAOZ [name], MD    Jesus Rubio, is a 76 y.o. male    Chief Complaint   Patient presents with    Hypotension     Patient states that his blood pressure is still up and down. Patient denies having any headaches, dizziness, chest pain or shortness of breath.     Medication Problem     Patient wants to discuss the pregabalin states they cant afford it and would like to discuss another option. Patient has been out for a week.     Pain with Swallowing     Patient states that the back of his throat has been hurting.       HPI:    Patient presents: With wife.    Nursing notes and history reviewed with patient.    Hypotension.  Noted previously.  No recent falls.  No dizziness presyncope or syncope.  Doing okay with reduction in isosorbide to 30 mg daily.  No increase in chest pain.  No change in exercise tolerance.  No recent falls.    Insomnia.  Noted previously.  Taking trazodone 50 mg which has been helpful.  Does  not get to sleep as well as he has before.  Denies additional concerning related symptoms.    Coronary artery disease. Compliant with therapy. No chest pain, shortness of breath, orthopnea, PND.  No increase nitroglycerin use.  No bleeding complications from aspirin therapy noted.    Gastroesophageal reflux disease.  Has used omeprazole in the past.  Not using currently.  Back of his throat feels scratchy and burning at times.  Denies concerning postnasal drainage.    Was on pregabalin but has been off now.  Denies recent related complications.    No other complaints.  No additional recent health care visits elsewhere. No urgent care or ED visits.  No additional palliative, provocative, modifying, quantifying, qualifying, or other related factors.  Unless otherwise outlined above patient is pleased with current medication regimen, admits compliance and denies side effects of medication.    The following portions of the patient's history were reviewed and updated as appropriate: allergies, current medications, past family history, past medical history, past social history, past surgical history and problem list.    ROS: Pertinent positives and negatives are as outlined in the HPI above. The remaining balance of the 12 point review of systems is otherwise benign.      PCMH Components:      Goals         <130/80       Lack of exercise or physical activity    Increase exercise or physical activity          Get heart rate stabilized (pt-stated)         Things to think about to help me reach my goal:     What are you going to do? Take medications as prescribed, Keep positive thinking, Pace activities, Stay hydrated   How and how much? Daily   How frequent? Daily   Barriers to success?    Solutions to barriers?      06/04/17: Patient continues to work on this goal   09/29/17 Patient continues to work on this goal   12-09-2017: Reports he continues to work on this and is staying positive.        Take actions to prevent falling       12-03-2016: New goal: Will change positions slowly and pay attention to pathways. Uses cane or walker at times.   09/29/17 Patient continues to work on this goal   12-09-2017: Reports 1 fall 6-8 months ago when he missed a step and fell. Denies injury. Uses cane occasionally.                Family characteristics, patient's social characteristics, patient's cultural characteristics, patient's communication needs, patient's health literacy and behaviors affecting patient's health are outlined under the patient's History Section, Longitudinal Care Plan or Problem List in Epic or as pertinent in History of Present Illness.    Barriers to goals identified and addressed.   See Assessment and Plan for additional pertinent details.    Objective:     Physical Exam   BP 128/72  - Pulse 96  - Temp 36.9 ??C (98.4 ??F) (Oral)  - Resp 16  - Ht 182.9 cm (6' 0.01)  - Wt (!) 110.7 kg (244 lb)  - SpO2 96%  - BMI 33.08 kg/m??   Constitutional: Oriented to person, place, and time. Appears well-developed and well-nourished. No distress.   Head: Normocephalic and atraumatic.   Ears/Nose/Mouth/Throat: EACs and TMs are benign. Nares is patent without noted abnormality. Oropharynx is without mass  or concerning lesions. Moist mucous membranes.  Eyes: Conjunctivae are normal. No scleral icterus.   Neck: Neck supple. Normal range of motion. No JVD present. No thyromegaly present. No concerning neck mass.   Cardiovascular: Normal rate. Regular rhythm. Normal heart sounds. No murmur heard.  Pulmonary/Chest: Effort normal. No respiratory distress. Breath sounds normal. No wheezes. No rhonchi. No rales. No focal breath sound changes. No chest tenderness.  Abdominal: Soft. Non-distended. No abdominal mass. No tenderness. No rebound. No guarding.   Musculoskeletal: No active synovitis. No concerning joint erythema, warmth or swelling.   Range of motion is without evidence of significant impairment.   Strength is without evidence of significant impairment.   No edema of the extremities.   Lymphadenopathy: There is no lymphadenopathy about the head or neck.  Neurological: Alert and oriented to person, place, and time. No cranial nerve deficit. Normal muscle tone. Normal coordination.  Skin: Skin is warm and dry. No pallor. No rash noted.   Psychiatric: Normal mood, affect and behavior. Judgment and thought content normal.

## 2021-08-27 DIAGNOSIS — E119 Type 2 diabetes mellitus without complications: Principal | ICD-10-CM

## 2021-08-27 MED ORDER — GLIPIZIDE 5 MG TABLET
ORAL_TABLET | Freq: Every day | ORAL | 2 refills | 30 days | Status: CP
Start: 2021-08-27 — End: ?

## 2021-08-27 NOTE — Unmapped (Signed)
Patient is requesting the following refill  Requested Prescriptions     Pending Prescriptions Disp Refills    glipiZIDE (GLUCOTROL) 5 MG tablet [Pharmacy Med Name: GLIPIZIDE 5 MG TABLET] 30 tablet 1     Sig: TAKE 1 TABLET (5 MG TOTAL) BY MOUTH IN THE MORNING. FOR DIABETES..       Recent Visits  Date Type Provider Dept   08/08/21 Office Visit [name], MD [place] [facility]   07/25/21 Office Visit [name], MD [place] [facility]   07/04/21 Office Visit [name], MD [place] [facility]   04/05/21 Office Visit [name], [place] [facility]   02/22/21 Office Visit [name], [place] [facility]   02/13/21 Office Visit [name], [place] [facility]   01/18/21 Office Visit [name], [place] [facility]   01/01/21 Office Visit [name], [place] [facility]   12/24/20 Office Visit [name], [place] [facility]   11/19/20 Office Visit [name], [place]  [facility]   Showing recent visits within past 365 days with a meds authorizing provider and meeting all other requirements  Future Appointments  Date Type Provider Dept   10/22/21 Appointment [name], MD  [facility] At [facility]   Showing future appointments within next 365 days with a meds authorizing provider and meeting all other requirements       Labs: A1c:   Hemoglobin A1C (%)   Date Value   07/04/2021 8.3 (A)

## 2021-08-29 NOTE — Unmapped (Signed)
Abstraction Result Flowsheet Data    This patient's last AWV date: [place] Last Medicare Wellness Visit Date: 12/09/2017  This patients last WCC/CPE date: : Not Found      Reason for Encounter  Reason for Encounter: Outreach  Primary Reason for Outreach: AWV  Text Message: No  MyChart Message: Yes  Outreach Call Outcome: Left message

## 2021-08-30 NOTE — Unmapped (Signed)
[facility] Refill Coordination Note    Specialty Medication(s) to be Shipped:   General Specialty: Repatha    Other medication(s) to be shipped: No additional medications requested for fill at this time     Jesus Rubio, DOB: [birth year]  Phone: [phone] (home)       All above HIPAA information was verified with patient's family member, wife.     Was a Nurse, learning disability used for this call? No    Completed refill call assessment today to schedule patient's medication shipment from the [facility] [phone]).  All relevant notes have been reviewed.     Specialty medication(s) and dose(s) confirmed: Regimen is correct and unchanged.   Changes to medications: Jesus Rubio reports no changes at this time.  Changes to insurance: No  New side effects reported not previously addressed with a pharmacist or physician: None reported  Questions for the pharmacist: No    Confirmed patient received a Conservation officer, historic buildings and a Surveyor, mining with first shipment. The patient will receive a drug information handout for each medication shipped and additional FDA Medication Guides as required.       DISEASE/MEDICATION-SPECIFIC INFORMATION        For patients on injectable medications: Patient currently has 0 doses left.  Next injection is scheduled for 09/13/2021.    SPECIALTY MEDICATION ADHERENCE     Medication Adherence    Patient reported X missed doses in the last month: 0  Specialty Medication: Repatha 140mg /ml  Patient is on additional specialty medications: No  Patient is on more than two specialty medications: No  Any gaps in refill history greater than 2 weeks in the last 3 months: no  Demonstrates understanding of importance of adherence: yes  Informant: spouse  Reliability of informant: reliable  Confirmed plan for next specialty medication refill: delivery by pharmacy  Refills needed for supportive medications: not needed              Were doses missed due to medication being on hold? No        REFERRAL TO PHARMACIST     Referral to the pharmacist: Not needed      [facility]     Shipping address confirmed in Epic.     Delivery Scheduled: Yes, Expected medication delivery date: 09/06/2021.     Medication will be delivered via Same Day Courier to the prescription address in Epic WAM.    Jesus Rubio D Marri Mcneff   [facility] Specialty Technician

## 2021-09-03 NOTE — Unmapped (Signed)
[facility] CARDIOLOGY AT [facility]  [address] - Phone: [phone]  [place], [name] [address] - Fax: [phone]     Date of Service: 09/04/2021    PCP: Stacie Hamburger, MD  Referring Provider: [name], PA    ASSESSMENT and PLAN:     Jesus Rubio is a 76 y.o. male presenting at the request of [name], PA for evaluation and management of:  No diagnosis found.    Mr. Angelino presents in follow up. He has a complex history of coronary disease and new diagnoses of renal artery stenosis s/p PCI and R subclavian stenosis. He continues to have chest pain consistent with stable angina but episodes are less intense. His BP's have improved and he has had some low BP's with symptoms at home. His PCP reduced imdur from 120mg  to 60mg  yesterday. We reviewed his coronary disease gain with most recent LHC showing CTO of the left main new since 2016. He also has CTO of the mid-LAD. The proximal LAD is now supplied only by collaterals from PDA. I've spoken to both Dr's Stouffer and Andrey Farmer and the risk of left main CTO pci is high and benefit may be low. Chest pain is not the only symptom limiting Mr Rubio. He also has generalized body aches especially in his upper arms and legs. Statin intolerance has been considered before and he felt better on a trial off statin last year. I will add on a CK to his most recent labs (BMP, CBC pending) but prior CK have been normal. We will stop crestor and instead switch to Repatha.    1. 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 NSTEMI while hospitalized for COVID, managed medically with plavix. Plavix was later discontinued in favor of eliquis due to PE  - anti-platelets: none currently while on eliquis  - anti-anginal: metoprolol succinate 100mg  BID, imdur 60mg  daily  - statin: atorvastatin 40mg  daily => 10mg  ?muscle aches on higher dose => crestor 20mg  daily => Repatha, stop crestor  - last echo: 05/12/20 normal EF  - last EF  - last stress/angiography:04/2021 see below. New since 2016 is CTO left main    2. Afib and Atrial flutter s/p ablation x2. Recurrence of paroxysmal Afib during hospitalization for COVID19 03/2020-04/2020. ER visit 02/2021 for symptomatic afib. No clear trigger.  - CHADS2-Vasc score: 6 (HTN, age+2, PE and CVA+2, IHD)  - Anti-coagulation: Eliquis  - Rate control: metoprolol as above, additional 50mg  prn dose if needed for sustained palpitations  - Rhythm control: not indicated    3. COVID19 with lengthy, complicated hospitalization 12/21-1/22. ARDS, NSTEMI, GI bleed, ARF requiring dialysis, occipital lobe CVA, PE  - has SOB with exertion, waiting for home O2    4. History of PE  - on eliquis    5. R subclavian stenosis  - BP in left arm    6. Renal artery stenosis s/p R renal artery DES 05/2021  - plavix for 6 months    Follow-up: Return in about 2 months (around 11/04/2021).     [name], MD, PhD  [facility]      ORDERS THIS VISIT:     Orders Placed This Encounter   Procedures    Ambulatory referral to Cardiac Rehab    Echocardiogram  W Colorflow Spectral Doppler      New Prescriptions    No medications on file      Modified Medications  No medications on file      Discontinued Medications    No medications on file        SUBJECTIVE:     ID: Jesus Rubio is a 76 y.o. male with a history of CAD s/p CABG, aflutter s/p ablation, COVID19 with numerous complications during hospitalization 03/2020-04/2020    Reason for Visit: Coronary Artery Disease (2 month checkup)     Cardiovascular History:    Previously evaluated by [name] and last saw him 05/2019. He has a history of CABG and aflutter s/p ablation. Ziopatch 06/2019 did not show recurrence of aflutter. Prolonged hospitalization at [facility] from 03/17/20 to 04/17/20 for COVID19 with hypoxic respiratory failure due to ARDS, bilateral subsegmental PE, GI bleed requiring transfusion, NSTEMI, afib, L occipital lobe CVA.    Presented to [facility] ER fractures    02/26/21 ER visit for afib with RVR and HTN. Prescribed prn diltiazem by ER in addition to BID metoprolol.    04/2021 PET stress for evaluation of CP, showing large reversible apical/anterior/septal defect. Referred for [facility]): interval proximal occlusion of the left main, LIMA is patent but mid LAD occlusion prevents backfilling of the proximal LAD. Unsuccessful attempt to pass a wire through CTO left main. All grafts patent. RAS 70% on the R. Also found to have differential of >18mmHg SBP/DBP between L and R arm.     05/2021 reviewed case with [name]. Plan for treatment of RAS. Do not think coronary intervention will be worth the risk. S/p DES to right renal artery. Angiography also showed right subclavian stenosis.    Interval History:    Chest pain episodes 2-3 times a week, lasting about 1-2 minutes. Not as intense as before. Not as sharp now. The more intense episodes are less frequent.    DOE is about the same.    PCP reduced imdur to 60mg  daily yesterday due to low BP.    Has some palpitations, but not necessarily related to chest discomfort.    Has a lot of pain the legs/arms, feels like that's the primary limitation.    Muscle pain was better off statins.    Review of Systems  10 systems were reviewed and negative except as noted in HPI.    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    Prior Cardiovascular Interventions / Surgery:  CABG 2006 LIMA-LAD,  SVG-OM, SVG-PDA  Aflutter ablation 2016, and 2017  R renal artery PCI 05/2021 Stouffer    Prior Cardiovascular Diagnostics:    ECG 06/28/20 sinus rhythm 77bpm, RBBB with non-specific ST/TW abnormalities  ECG 02/26/21 afib 120bpm, RBBB  ECG 03/05/21 sinus rhythm with PACs, RBBB  ECG 04/04/21 sinus rhythm with PACs, RBBB    Echo 04/11/20 EF normal  Echo 12/2020 normal EF, normal RV    Ziopatch 07/2020  - 16 short SVT episodes were recorded, the longest lasting 7.7 seconds. These appear to be atrial tachycardia.   - Otherwise unremarkable 14 day  ambulatory cardiac monitor without patient triggered events.     PET Stress 04/2021  - Abnormal myocardial perfusion study  - There is a large in size, moderately severe, completely reversible perfusion defect involving the apical, apical anterior, mid anterior, basal anterior, apical septal, mid anteroseptal, basal anteroseptal, mid inferoseptal and basal inferoseptal segments. This is consistent with probable ischemia.  - Left ventricular systolic function is normal. Post stress the ejection fraction is > 60%.  - Coronary and aortic calcifications are noted  - Aortic valve calcifications are noted  - Attenuation CT scan shows post CABG findings  - Status post cholecystectomy  - Nonspecific bilateral reticular changes in the lungs, including areas of atelectasis    LHC 05/04/21  Small area of lateral wall hypokinesis with preserved LV systolic function.  Coronary artery disease including proximal occlusion of the left main and long 70% lesion in the mid-RCA.  Prior coronary bypass surgery with patent LIMA to distal LAD, patent SVG to OM1 and OM2 and patent SVG to PDA.   70% right renal artery stenosis. Widely patent left renal artery.  Interval change since angiography on 07/14/14 was total occlusion of left main. Proximal LAD was now supplied by collaterals from PDA since occlusion of mid-LAD (present in 2016) prevented backfilling of the proximal LAD from LIMA to LAD graft.  Unsuccessful attempt to pass a wire through totally occluded left main.      Other Surgical History:   has a past surgical history that includes Replacement total knee; Cholecystectomy; Thyroidectomy, partial; Prostate surgery; Foot arthrodesis, triple; Cardiac surgery (2006); Joint replacement; pr upper gi endoscopy,biopsy (N/A, 12/01/2013); pr compre ep eval abltj 3d mapg tx svt (N/A, 05/16/2015); Knee arthroscopy; pr colonoscopy flx dx w/collj spec when pfrmd (N/A, 04/03/2020); pr gi imag intraluminal esophagus-ileum w/i&r (N/A, 04/05/2020); pr cath place/coron angio, img super/interp,w left heart ventriculography (N/A, 05/03/2021); and pr revascularize fem/pop artery,angioplasty/stent (N/A, 06/03/2021).    Current Outpatient Medications   Medication Instructions    acetaminophen (TYLENOL) 650 mg, Oral, Every 6 hours PRN    albuterol HFA 90 mcg/actuation inhaler 2 puffs, Inhalation, Every 6 hours PRN    apixaban (ELIQUIS) 5 mg, Oral, 2 times a day (standard)    blood-glucose meter kit Check BS every day    clopidogreL (PLAVIX) 75 mg, Oral, Daily (standard)    cyanocobalamin (vitamin B-12) 1,000 mcg, Subcutaneous, Every 30 days    diclofenac sodium (VOLTAREN) 2 g, Topical, 4 times a day    dilTIAZem (CARDIZEM) 30 MG tablet 1 tab Q3 hr PRN bad palpitations, HR>110.  Be sure top number blood pressure over 95.    empty container (SHARPS-A-GATOR DISPOSAL SYSTEM) Misc Use as directed for sharps disposal    ergocalciferol-1,250 mcg (50,000 unit) (DRISDOL) 1,250 mcg, Oral, Weekly    evolocumab (REPATHA SURECLICK) 140 mg/mL PnIj Inject the contents of one pen (140 mg) under the skin every fourteen (14) days.    fluticasone propionate (FLONASE) 50 mcg/actuation nasal spray 2 sprays, Each Nare, Daily (standard)    glipiZIDE (GLUCOTROL) 5 mg, Oral, Daily, For diabetes.    isosorbide mononitrate (IMDUR) 30 mg, Oral, Daily (standard)    metoprolol succinate (TOPROL-XL) 100 MG 24 hr tablet TAKE 2 TABLETS BY MOUTH EVERY DAY    miscellaneous medical supply Misc Glucometer  Dx: E11.9    miscellaneous medical supply Misc Glucometer test strips   Dx: E11.9    miscellaneous medical supply Misc Lancets  Dx  E11.9    nitroglycerin (NITROLINGUAL) 0.4 mg/dose spray  1 spray, Every 5 min PRN    nitroglycerin (NITROSTAT) 0.4 mg, Sublingual, Every 5 min PRN, Maximum of 3 doses in 15 minutes.    omeprazole (PRILOSEC) 10 MG capsule TAKE 1 CAPSULE BY MOUTH EVERY DAY PLUS 20 MG TO EQUAL 30 MG FOR TAPERING OFF.    omeprazole (PRILOSEC) 20 mg, Oral, 2 times a day (standard)    ONETOUCH VERIO TEST STRIPS Strp USE TO CHECK BLOOD SUGAR 1-2 TIMES PER DAY    traZODone (DESYREL) 100 mg, Oral, Nightly      Allergies:  is allergic to escitalopram oxalate, ace inhibitors, and statins-hmg-coa reductase inhibitors.    Social History:  He  reports that he quit smoking about 49 years ago. His smoking use included cigarettes. He has a 15.00 pack-year smoking history. He has never used smokeless tobacco. He reports that he does not drink alcohol and does not use drugs.    Family History:  His family history includes Cancer in his brother, brother, brother, father, mother, sister, and sister; Depression in his mother; Heart attack in his brother, father, and mother; Lung cancer in his brother.       OBJECTIVE:      There were no vitals taken for this visit.     There were no vitals filed for this visit.      Wt Readings from Last 12 Encounters:   08/08/21 (!) 110.7 kg (244 lb)   07/31/21 (!) 112 kg (247 lb)   07/25/21 (!) 111.4 kg (245 lb 8 oz)   07/10/21 (!) 108.9 kg (240 lb)   07/05/21 (!) 109.3 kg (240 lb 14.4 oz)   07/04/21 (!) 108.2 kg (238 lb 8 oz)   06/03/21 (!) 109.3 kg (241 lb)   05/17/21 (!) 110.8 kg (244 lb 3.2 oz)   05/10/21 (!) 110.5 kg (243 lb 11.2 oz)   05/03/21 (!) 109.3 kg (241 lb)   04/18/21 (!) 109.5 kg (241 lb 6.4 oz)   04/10/21 (!) 110.2 kg (243 lb)     BP Readings from Last 5 Encounters:   08/08/21 128/72   07/31/21 143/67   07/25/21 108/66   07/05/21 110/70   07/04/21 110/70     Pulse Readings from Last 3 Encounters:   08/08/21 96   07/31/21 90   07/25/21 73       General:  Alert, no distress.   Eyes:  EOMI, sclerae anicteric   Neck: No carotid bruit. JVP is not visible sitting upright   Resp:   CTAB bilaterally with normal WOB.   CV:  RRR with frequent ectopy, normal s1 and s2, no murmurs, normal radial pulses b/l, mild LE edema   GI:   Abdomen soft, non-tender   MSK: Normal muscle tone, no joint swelling/effusion   Skin: No lesions/rashes.   Neuro: No focal deficits.   Psych: Normal mood, normal affect     Lab Results   Component Value Date    HGB 11.3 (L) 07/05/2021    HGB 11.8 (L) 06/03/2021    HGB 11.7 (L) 05/03/2021    PLT 321 07/05/2021    PLT 299 06/03/2021    PLT 299 05/03/2021     Lab Results   Component Value Date    CREATININE 1.30 07/05/2021    CREATININE 1.23 (H) 06/03/2021    CREATININE 1.26 (H) 05/03/2021    K 4.2 07/05/2021    K 4.3 06/03/2021    K 4.3 05/03/2021      Lab Results  Component Value Date    BNP 408 (H) 03/18/2020     Lab Results   Component Value Date    PROBNP 281.0 04/05/2021    PROBNP 3,890.0 (H) 02/26/2021    PROBNP 1,620.0 (H) 02/09/2021     Lab Results   Component Value Date    CHOL 129 07/05/2021    CHOL 159 05/03/2021    LDL 48 (L) 07/05/2021    LDL 97 05/03/2021    HDL 50 07/05/2021    HDL 40 05/03/2021    TRIG 086 (H) 07/05/2021    TRIG 112 05/03/2021     Lab Results   Component Value Date    A1C 8.3 (A) 07/04/2021 07/04/2021

## 2021-09-04 ENCOUNTER — Ambulatory Visit: Admit: 2021-09-04 | Discharge: 2021-09-05 | Payer: MEDICARE | Attending: Internal Medicine | Primary: Internal Medicine

## 2021-09-04 DIAGNOSIS — Z789 Other specified health status: Principal | ICD-10-CM

## 2021-09-04 DIAGNOSIS — I152 Hypertension secondary to endocrine disorders: Principal | ICD-10-CM

## 2021-09-04 DIAGNOSIS — E119 Type 2 diabetes mellitus without complications: Principal | ICD-10-CM

## 2021-09-04 DIAGNOSIS — I259 Chronic ischemic heart disease, unspecified: Principal | ICD-10-CM

## 2021-09-04 DIAGNOSIS — E785 Hyperlipidemia, unspecified: Principal | ICD-10-CM

## 2021-09-04 DIAGNOSIS — E1159 Type 2 diabetes mellitus with other circulatory complications: Principal | ICD-10-CM

## 2021-09-04 DIAGNOSIS — I701 Atherosclerosis of renal artery: Principal | ICD-10-CM

## 2021-09-04 DIAGNOSIS — I771 Stricture of artery: Principal | ICD-10-CM

## 2021-09-04 DIAGNOSIS — I208 Other forms of angina pectoris: Principal | ICD-10-CM

## 2021-09-04 DIAGNOSIS — E1169 Type 2 diabetes mellitus with other specified complication: Principal | ICD-10-CM

## 2021-09-04 DIAGNOSIS — I240 Acute coronary thrombosis not resulting in myocardial infarction: Principal | ICD-10-CM

## 2021-09-04 NOTE — Unmapped (Addendum)
I think you should try cardiac rehab to get stronger.    No medication changes today.    I've also ordered echocardiogram to evaluate your heart function to be done at [facility].    I've referred you for cardiac rehab.    [name], MD, PhD  [facility]

## 2021-09-06 MED FILL — REPATHA SURECLICK 140 MG/ML SUBCUTANEOUS PEN INJECTOR: SUBCUTANEOUS | 28 days supply | Qty: 2 | Fill #2

## 2021-09-16 DIAGNOSIS — G4700 Insomnia, unspecified: Principal | ICD-10-CM

## 2021-09-16 MED ORDER — TRAZODONE 50 MG TABLET
ORAL_TABLET | 1 refills | 0 days
Start: 2021-09-16 — End: ?

## 2021-09-17 MED ORDER — TRAZODONE 50 MG TABLET
ORAL_TABLET | 1 refills | 0 days | Status: CP
Start: 2021-09-17 — End: ?

## 2021-09-18 NOTE — Unmapped (Signed)
Abstraction Result Flowsheet Data    This patient's last AWV date: [place] Last Medicare Wellness Visit Date: 12/09/2017  This patients last WCC/CPE date: : Not Found      Reason for Encounter  Reason for Encounter: Outreach  Primary Reason for Outreach: AWV  Text Message: No  MyChart Message: Yes  Outreach Call Outcome: Left message

## 2021-09-24 ENCOUNTER — Institutional Professional Consult (permissible substitution): Admit: 2021-09-24 | Payer: MEDICARE

## 2021-09-24 NOTE — Unmapped (Signed)
Orientation to Cardiac/Pulmonary rehab program    COVID Protocol  reviewed with patient including symptoms to stay home,  Personal Protective Equipment  (use of mask), social distancing, visitor restrictions, hand hygiene, traffic flow, and cleaning requirements reviewed.     The patient has been oriented to the following:  Waiting room, Bathrooms, Towels, Appropriate clothing, Availability of water, staff offices, education classes daily and materials, Fire exit, Emergency procedures, Telemetry, daily weights, Cancellation policy-such as inclement weather-or patient planned absences, activity guidelines and precautions, Tour of the facility, Patient informed to report to staff if they have symptoms- such as chest pain, pain anywhere, and or dizziness, Patient has been informed of RPE scale, and THRR, Patient has been informed when BP, HR and or (SA02 done for PR) .  Above has been reviewed with patient and allowed patient to ask questions.     Patient has signed and witnessed consents to participate in the program, privacy waiver, attendance policy,vocational rehab form for CR only.  Patient  has been provided with an orientation folder that includes a patient rights, welcome letter, sanitize handout, and phone numbers for home reference.    ORIENTATION TO GYM EQUIPMENT:  Treadmill: How to get on and off, safety clip, etc.  Arm Egometer: Adjust height for comfort, Avoid excess speed, increase and decrease tension.  Stationary Bike/Recumbent Bike: Adjust seat height, proper settings indicated by staff, exercising arm and legs or rest. Dumbbells: Use only prescribed pounds, use in open area with sufficient space, perform exercises as instructed by staff, replace securely in track when finished.  Bands: Use slow moderate pace, keep back straight. Elliplitical/seated Ellipitical:How to get onto machine, position seat properly, hold onto handle as you step up onto machine, proper settings indicated by staff.

## 2021-09-24 NOTE — Unmapped (Signed)
CR assessment scheduled for Thursday, October 03, 2021 at 1100.

## 2021-09-27 NOTE — Unmapped (Signed)
[facility] Refill Coordination Note    Specialty Medication(s) to be Shipped:   General Specialty: Repatha    Other medication(s) to be shipped: No additional medications requested for fill at this time     Jesus Rubio, DOB: 1946-01-22  Phone: [phone] (home)       All above HIPAA information was verified with patient.     Was a Nurse, learning disability used for this call? No    Completed refill call assessment today to schedule patient's medication shipment from the [facility] [phone]).  All relevant notes have been reviewed.     Specialty medication(s) and dose(s) confirmed: Regimen is correct and unchanged.   Changes to medications: [name] reports no changes at this time.  Changes to insurance: No  New side effects reported not previously addressed with a pharmacist or physician: None reported  Questions for the pharmacist: No    Confirmed patient received a Conservation officer, historic buildings and a Surveyor, mining with first shipment. The patient will receive a drug information handout for each medication shipped and additional FDA Medication Guides as required.       DISEASE/MEDICATION-SPECIFIC INFORMATION        For patients on injectable medications: Patient currently has 1 doses left.  Next injection is scheduled for 09/27/21.    SPECIALTY MEDICATION ADHERENCE     Medication Adherence    Specialty Medication: Repatha 140mg /ml  Patient is on additional specialty medications: No  Patient is on more than two specialty medications: No              Were doses missed due to medication being on hold? No    Repatha 140 mg/ml: 1 days of medicine on hand       REFERRAL TO PHARMACIST     Referral to the pharmacist: Not needed      [facility]     Shipping address confirmed in Epic.     Delivery Scheduled: Yes, Expected medication delivery date: 10/04/21.     Medication will be delivered via Same Day Courier to the prescription address in Epic WAM.    Nancy Nordmann [facility] Specialty Technician

## 2021-10-02 NOTE — Unmapped (Signed)
Called to confirm cardiac rehab assessment for 10/03/21 at 1100. No answer; LVM.

## 2021-10-03 NOTE — Unmapped (Signed)
CARDIAC REHABILITATION INITIAL ASSESSMENT, EXERCISE PRESCRIPTION &  INDIVIDUALIZED TREATMENT PLAN  ITP Phase: Initial   Date: 10/03/2021    Patient Name: Jesus Rubio [facility]                                              Angina Scale: 4+  Diagnosis:    Diagnosis ICD-10-CM Associated Orders   1. Chronic stable angina (CMS-HCC)  I20.8       2. 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  Additional medical conditions and/ or symptoms that will affect their exercise Rx and treatment plan include chronic pain r/t neuropathy, arthritis and mobility issues. Patient will be placed on chronic angina protocol, diabetes and fall precautions protocol.  BP 168/84 Comment: left arm BP, right arm BP 180/78 (subclavian stenosis right arm) - Pulse 92  - Resp 22  - Ht 182.9 cm (6')  - Wt (!) 111.1 kg (245 lb)  - SpO2 96%  - BMI 33.23 kg/m?? .   Did not complete exercise assessment at today's visit.  Entry depression screen was completed.  PHQ-9 Score: 9. The patient will meet with registered dietician for 1:1 consult. Will contact PCP related to possible diabetes education referral.  See exercise assessment and prescriptions below for further details including program goals. The patient reports mulitple anginal symptoms since discharge from the hospital.   Educated patient on CAD risk factors and its relationship to the disease process. Reviewed recent intervention, medication management and lifestyle modification.      CARDIOVASCULAR HISTORY AND PROCEDURES     Cardiovascular Studies Date Comments      ECG 06/07/21    Echo  10-07-21  Summary  1. The left ventricle is normal in size with normal wall thickness.  2. The left ventricular systolic function is normal, LVEF is visually estimated at > 55%.  3. The right ventricle is normal in size, with normal systolic function.        Stress test       Cardiac catheterization 06/03/21    Date of Procedure: 06/03/2021      ____________________________________________________________________________  Findings:  1. 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 70% right renal artery stenosis   3. Successful PTI of right renal artery with placement of an Onyx drug eluting stent     Recommendations:  ??? PTI performed (see details below)  ??? Aggressive secondary prevention  ??? ASA 81 mg daily indefinitely  ??? Clopidogrel 75 mg daily for at least 6 months.  ??? CYP2C19 Genotyping     Complications:  ??? None     Date of Procedure: 05/03/21     ____________________________________________________________________________  Findings:  4. Small area of lateral wall hypokinesis with preserved LV systolic function.  5. Coronary artery disease including proximal occlusion of the left main and long 70% lesion in the mid-RCA.  6. Prior coronary bypass surgery with patent LIMA to distal LAD, patent SVG to OM1 and OM2 and patent  SVG to PDA.   7. 70% right renal artery stenosis. Widely patent left renal artery.  8. Interval change since angiography on 07/14/14 was total occlusion of left main. Proximal LAD was now supplied by collaterals from PDA since occlusion of mid-LAD (present in 2016) prevented backfilling of the proximal LAD from LIMA to LAD graft.  9. Unsuccessful attempt to pass a wire through totally occluded left main.        Recommendations:  ??? Per Referring Physician  ??? Aggressive secondary prevention  ??? Of note is that there was a discrepancy in non-invasive blood pressures between his two arms when measured after the procedure: right arm was 147/70 mm Hg and left arm was 182/102 mm Hg.     Complications:  ??? None         Electrophysiology  2017?? ??The patient was sedated by the anesthesia staff with deep sedation. Once he was adequately sedated, a single biphasic 200J shock was delivered through anterior-posterior patches. This successfully converted him to normal sinus rhythm   CONCLUSIONS   Findings: Uncomplicated electrophysiology study Inducible typical atrial flutter Successful catheter ablation with creation of a cavotricuspid isthmus line of block   Plan: Oral anticoagulation     Signed electronically by: Dimple Nanas May 16, 2015 11:26 AM    ??  AACVPR Risk Stratification:             HIGH  ??? Left ventricular EF <40% at rest no  ??? MI or  cardiac surgery complicated by cardiogenic shock, CHF, and /or signs/symptoms of post -procedure ischemia  no  ??? Functional capacity < 5 METS yes  ??? Signs/symptoms including angina,pectoris , dizziness, lightheadedness or dyspnea at low levels of exercise (<5 .0 METS ) or in recoveryl no  ??? Significant silent ischemia (ST  Depression 2 mm or greater without symptoms) with exercise or in recoveryno ??? Survivor of sudden cardiac death no  ??? Complex ventricular dysrhythmias (ventricular tachycardia, frequent (>6 min) multiform PVCs) ar rest or with exercise  no  ??? Extremely severe depression  no  ??? Abnormal Hemodynamics with exercise , especially flat or decreasing systolic blood pressure or chronotropic incompetence with inccreaseing worklogadFall in exercise SBP or failure of SBP to rise more than 10 mmHg on GXTno                       Risk Factor  Defining Criteria  Yes /No    Age  Men >_ 45 yrs: Women >_55 yrs yes   Family History  Heart attck, ???bypass surger, or sudden death before the age of 88 yrs for fatehr/brother; or before 92 yrs for mother/sister yes   Cigarette smoking  Current smoker, or have quit <6 months , or is exposed to environmental smoke no   Sedentary lifestyle  Not participating in moderate (that makes you sweat) physical activity at least 3 days /week for 3 months  yes   Obesity  Body Mass Index >- 30 kg/m2 or waist girth >102 cm (40 in) for mena dn >88 cm (35 in) for wormen.  yes   Hypertension SBP> and or Diastolic >85 or taking BP medication  yes   Dyslipidemia  LDL > 70 mg/dl or HDL <29 mg /dl, or taking medication or TC >200 mg/dl  yes   Diabetes and or Pre diabetes  IFG >100 mg/dl or OGTT > 562  on 2 separate days. AIC > 6.0 yes    * Stress  level           Subject reports high stress; PHQ >10, lack of                                       support system, lack of faith,  and or reports                                   anger issues             no      Allergies:       Allergies   Allergen Reactions   ??? Escitalopram Oxalate Hives, Itching and Nausea And Vomiting   ??? Ace Inhibitors     ??? Statins-Hmg-Coa Reductase Inhibitors Muscle Pain         Medications:     Current Outpatient Medications:   ???  acetaminophen (TYLENOL) 325 MG tablet, Take 2 tablets (650 mg total) by mouth every six (6) hours as needed for pain., Disp: , Rfl: 0  ???  albuterol HFA 90 mcg/actuation inhaler, Inhale 2 puffs every six (6) hours as needed for wheezing or shortness of breath. (Patient not taking: Reported on 05/17/2021), Disp: 18 g, Rfl: 0  ???  apixaban (ELIQUIS) 5 mg Tab, Take 1 tablet (5 mg total) by mouth Two (2) times a day., Disp: 180 tablet, Rfl: 3  ???  blood-glucose meter kit, Check BS every day, Disp: 1 kit, Rfl: 0  ???  clopidogreL (PLAVIX) 75 mg tablet, Take 1 tablet (75 mg total) by mouth daily., Disp: 30 tablet, Rfl: 11  ???  cyanocobalamin, vitamin B-12, 1,000 mcg/mL injection, Inject 1 mL (1,000 mcg total) under the skin every thirty (30) days., Disp: 10 mL, Rfl: 1  ???  diclofenac sodium (VOLTAREN) 1 % gel, Apply 2 g topically four (4) times a day., Disp: 100 g, Rfl: 0  ???  dilTIAZem (CARDIZEM) 30 MG tablet, 1 tab Q3 hr PRN bad palpitations, HR>110.  Be sure top number blood pressure over 95., Disp: 30 tablet, Rfl: 0  ???  empty container (SHARPS-A-GATOR DISPOSAL SYSTEM) Misc, Use as directed for sharps disposal, Disp: 1 each, Rfl: 2  ??? ergocalciferol-1,250 mcg, 50,000 unit, (DRISDOL) 1,250 mcg (50,000 unit) capsule, Take 1 capsule (1,250 mcg total) by mouth once a week., Disp: 12 capsule, Rfl: 1  ???  evolocumab (REPATHA SURECLICK) 140 mg/mL PnIj, Inject the contents of one pen (140 mg) under the skin every fourteen (14) days., Disp: 2 mL, Rfl: 11  ???  fluticasone propionate (FLONASE) 50 mcg/actuation nasal spray, 2 sprays into each nostril in the morning., Disp: 16 g, Rfl: 5  ???  glipiZIDE (GLUCOTROL) 5 MG tablet, TAKE 1 TABLET (5 MG TOTAL) BY MOUTH IN THE MORNING. FOR DIABETES.., Disp: 30 tablet, Rfl: 2  ???  isosorbide mononitrate (IMDUR) 30 MG 24 hr tablet, Take 1 tablet (30 mg total) by mouth daily., Disp: 90 tablet, Rfl: 1  ???  metoprolol succinate (TOPROL-XL) 100 MG 24 hr tablet, TAKE 2 TABLETS BY MOUTH EVERY DAY, Disp: 180 tablet, Rfl: 1  ???  miscellaneous medical supply Misc, Glucometer  Dx: E11.9, Disp: 1 each, Rfl: 0  ???  miscellaneous medical supply Misc, Glucometer test strips   Dx: E11.9, Disp: 50 each, Rfl: 5  ???  miscellaneous medical supply Misc, Lancets  Dx  E11.9, Disp: 100 each, Rfl: 1  ???  nitroglycerin (NITROLINGUAL) 0.4 mg/dose spray, Place 1 spray under the tongue every five (5) minutes as needed for chest pain., Disp: , Rfl:   ???  nitroglycerin (NITROSTAT) 0.4 MG SL tablet, Place 1 tablet (0.4 mg total) under the tongue every five (5) minutes as needed for chest pain. Maximum of 3 doses in 15 minutes., Disp: 100 tablet, Rfl: 3  ???  omeprazole (PRILOSEC) 10 MG capsule, TAKE 1 CAPSULE BY MOUTH EVERY DAY PLUS 20 MG TO EQUAL 30 MG FOR TAPERING OFF., Disp: 90 capsule, Rfl: 1  ???  omeprazole (PRILOSEC) 20 MG capsule, Take 1 capsule (20 mg total) by mouth Two (2) times a day., Disp: 180 capsule, Rfl: 1  ???  ONETOUCH VERIO TEST STRIPS Strp, USE TO CHECK BLOOD SUGAR 1-2 TIMES PER DAY, Disp: 100 strip, Rfl: 1  ???  traZODone (DESYREL) 50 MG tablet, TAKE 2 TABLETS BY MOUTH NIGHTLY, Disp: 180 tablet, Rfl: 1     Medical History:  Past Medical History Past Medical History:   Diagnosis Date   ??? Alcoholism (CMS-HCC)     ??? Anxiety     ??? Arthritis     ??? At risk for falls     ??? B12 deficiency     ??? CHF (congestive heart failure) (CMS-HCC)     ??? Coronary artery disease     ??? Diabetes mellitus (CMS-HCC)     ??? Financial difficulties     ??? Hearing impairment     ??? Heart disease     ??? Hyperlipemia     ??? Hypertension     ??? Joint pain     ??? Major depressive disorder     ??? Peptic ulceration     ??? PONV (postoperative nausea and vomiting)     ??? Renal disorder              Surgical History:  Past Surgical History         Past Surgical History:   Procedure Laterality Date   ??? CARDIAC SURGERY   2006     Open heart surgery   ??? CHOLECYSTECTOMY       ??? FOOT ARTHRODESIS, TRIPLE       ??? JOINT REPLACEMENT       ??? KNEE ARTHROSCOPY       ??? PR CATH PLACE/CORON ANGIO, IMG SUPER/INTERP,W LEFT HEART VENTRICULOGRAPHY N/A 05/03/2021     Procedure: Left Heart Catheterization;  Surgeon: Alvira Philips, MD;  Location: [facility] CATH;  Service: Cardiology   ??? PR COLONOSCOPY FLX DX W/COLLJ SPEC WHEN PFRMD N/A 04/03/2020     Procedure: COLONOSCOPY, FLEXIBLE, PROXIMAL TO SPLENIC FLEXURE; DIAGNOSTIC, W/WO COLLECTION SPECIMEN BY BRUSH OR WASH;  Surgeon: Bronson Curb, MD;  [facility]: [facility];  Service: Gastroenterology   ??? PR COMPRE EP EVAL ABLTJ 3D MAPG TX SVT N/A 05/16/2015     Procedure: A-Flutter Ablation;  Surgeon: Dorcas Carrow, MD;  Location: [facility] EP;  Service: Cardiology   ??? PR GI IMAG INTRALUMINAL ESOPHAGUS-ILEUM W/I&R N/A 04/05/2020     Procedure: GI VIDEO TRACT IMAGE INTRALUMINAL (EG, CAPSULE ENDOSCOPY), ESOPHAGUS VIA ILEUM, PHYSICIAN INTERPRETATION & REPORT;  Surgeon: Beverly Milch, MD;  Location: [facility];  Service: Gastrointestinal   ??? PR REVASCULARIZE FEM/POP ARTERY,ANGIOPLASTY/STENT N/A 06/03/2021     Procedure: Peripheral Angiography W Intervetion;  Surgeon: Alvira Philips, MD;  Location: [facility] CATH;  Service: Cardiology   ??? PR UPPER  GI ENDOSCOPY,BIOPSY N/A 12/01/2013     Procedure: EGD;  Surgeon: Darletta Moll, MD;  Location: [facility] [place];  Service: General Surgery   ??? PROSTATE SURGERY       ??? REPLACEMENT TOTAL KNEE         RIGHT   ??? THYROIDECTOMY, PARTIAL                          Cardiac Rehabilitation Initial Assessment  MEDICAL ASSESSMENT     Labs:   Lab Results   Component Value Date    Hemoglobin A1C 8.3 (A) 07/04/2021    Cholesterol 129 07/05/2021    Cholesterol, Total 235 (H) 07/06/2014       Labs:   Lab Results   Component Value Date    Hemoglobin A1C 8.3 (A) 07/04/2021    Cholesterol 129 07/05/2021    Cholesterol, Total 235 (H) 07/06/2014         CLINICAL ASSESSMENT    Physical Assessment:  BP:  168/84 (left arm BP, right arm BP 180/78 (subclavian stenosis right arm))  Location:     SpO2:  96 %  HR:  92    Anthropometrics:  Weight: (!) 111.1 kg (245 lb)  Height: 182.9 cm (6')  BMI (Calculated): (!) 33.22       Pain Assessment:    Did the patient report any pain?: Yes  0-10 Scale: 7    Cardiovascular Assessment:  Edema: Yes  Left Side: 1+ Pitting Lower extremity  Right Side: 1+ Pitting Lower extremity    Skin Integrity: Skin Integrity: Intact  Chest Incision:    Leg/Arm Assessment:               Advance directives assessment :   Advance directives reviewed with patient on initial assessment? yes    BARRIERS TO LEARNING:    Interpreter Needed:  no / Type:        Hearing Impairment:  yes / Type:          Visual Impairment:  yes / Type:       Cognitive Impairment:  no / Type:         Communication Deficit:  no / Type:        Learning Preference:   Demonstration, Explanation and Written / Type:          CARDIOVASCULAR ASSESSMENT        10/03/2021   CARDIOVASCULAR ASSESSMENT   Cardiac Rhythm Other   Heart Sounds S1;S2;Distant   Skin Color Normal   Capillary Refill Less than/equal to 3 seconds   Edema  Yes   Edema Severity Left 1+ Pitting   Left Side Location  Lower extremity   Edema Severity Right 1+ Pitting   Right Side Location Lower extremity   Other comments hx of RBBB, hx of A-fib/flutter, however last EKG showed NSR with RBBB         EXERCISE PRESCRIPTION          10/10/2021     7:00 AM   EXERCISE GOALS   Patient goal will complete exercise assessment at next visit     Program Exercise  Quality baseline metrics were evaluated today with feedback and interpretation of findings explained to the patent and mutual goals set as listed above.         EXERCISE EDUCATION CLASSES:      09/24/2021     7:00 AM   EXERCISE EDUCATION   Exercise Education Safe  Exercise Parameters   Attend date 09/24/2021   Other comments contraindications to exerise     .  Chronic Chest Pain      Time  Initiated Procedure Performed RN initial    Discontinue exercise      Assist patient to seated or flat lying position     Assess the patient???s pulse, respirations, blood pressure and monitor pattern.  Assess and document type of chest pain, intensity, time of onset, and associated symptoms,  , If clinically unstable call 911 immediately.     Resolved   with rest If pain resolves in 1 to 3 minutes of rest, obtain vital signs  and if stable and pain completely resolves, the patient can continue with exercise.      Unresolved after 3 minutes of rest  Assess vital signs, and if SBP>100, administer 0.4 mg of sublingual nitroglycerin.  If pain completely resolve, and vital signs stable , notife the supervising MD. If this angina episode is consistent with his/her usual angina pattern, the patient may be discharged home at the discretion of the on site physician    Unresolved after 5 minutes from initial NTG Start oxygen at 4 liters by nasal cannula; administer a second sublingual nitroglycerin 0.4 mg if  SBP > 100.      Monitor ECG and obtain a 12 lead ECG-Assess for ischemic changes      Start IV access    Unresolved 5 minutes after second NTG Reassess pain  5 minutes after second NTG - reassess vital signs ,  repeat a third dose of NTG if pain persist and SBP > 90       If patient is transferred -call 911and prepare to transfer the patient to the ED-      Push red button to alert other MOB department staff of EMS      When [facility] staff respond-Delegate:  ??? calling 911 to scheduling staff   ??? removing patients from EMS route into the department  ??? Await EMS arrival at the back door  ??? Remove any patients from gym if needed  ??? Copy face sheet , ECG, and any other documentation for EMS       RN give report to EMS on arrival      RN call report to the receiving ED      RN complete AMB CARD/PULM incident report in progress not in EPIC- need completed even if charted on paper document and scanned to allow it to be copied and pasted into the PORS - non planned transfer report     RN Notify referring MD and PCP via routing the encounter to them in EPIC     RN scan in any ECG  and standing orders     RN complete PORS - non planned transfer report     RN document on untoward event sheet on CHAT board     RN - update patient???s treatment plan and place on medical leave until  reapproved     RN -route protocol to supervising MD and Medical Director for electronic signature. Print copy for the PI manual.      Purpose: The following standing orders will by initiated by the Cardiac / Pulmonary Rehabilitation Staff for patients presenting to the facility with a diagnosis of Diabetes who are taking any type of medications that lower blood glucose.  This protocol is for asymptomatic patients- please refer to hypoglycemia protocol if patient reports symptoms  Scope:  Registered Nurses &  Supervising Physicians must be current in ACLS:  Clinical Exercise Physiologist, Certified Nursing Assistances, and Registered Dietician must be current in BLS   Patients this applies to:  Active, established, Cardiac/Pulmonary Rehab who are taking medication for diabetes.  Excluded: individuals who have completed the entry assessment that attend a free educational orientation or wellness participants.   Basic CPR and 911 is called for these individuals if they report symptoms.     Diabetic Patient    .   Date   Time Initiated Procedure Performed RN Initial    Obtain pre and post exercise blood glucose for the first week to establish the patient's response to exercise      IF pre exercise BG is less than 100 mg dl but greater than 90 and the patient reports they have eaten with in the previous hour (consumed at least 15 grams of carbohydrate, no action needed except  to monitor for signs and symptoms of low BG during exercise and ensure post exercise BG is taken..       If pre or post  exercise BG  is less than 80 mg .dl , the patient should consume 15 grams  of carbohydrate before exercise      If pre or post exercise BG is less than 70 mg dl , the patient should consume 30 grams of carbohydrate.      Document in patient's EMR             Patients presenting to outpatient cardiac and pulmonary who meet the following criteria are at increase risk for falling:   History of falling in the past year  Self report poor balance  Score less than 30 sec on the Single Leg Stand test  Score less than 8  on Sit to Stand test  Use an assistive device (cane, walker)  Use supplemental O2 or have other medical devices with tubing that could easily be a trip hazard      Cardiac and Pulmonary Rehab staff will assist patient with the following:  Track walking,  Stepping on and off the scale,  Getting on and off the cardio machines in the gym,  Provide balance training exercises     NUTRITION ASSESSMENT          10/03/2021    11:00 AM   NUTRITION GOALS   Patient goal to meet with RD for 1:1 consult   Other Goals Patient will lose 1-2 lbs. per week if BMI is greater than 25;Patient will decrease waist circumference   Progress towards goal In progress;Verbalizes understanding;Other   Goal progress comment will meet with RD for 1:1 consult           10/03/2021    11:00 AM   NUTRITION INTERVENTIONS   Interventions Attend individual consultation with the RD 07/05/2021    10:26 AM 07/10/2021     9:24 AM 07/25/2021     3:09 PM 07/31/2021    11:13 AM 08/08/2021     1:26 PM 09/04/2021     3:10 PM 10/03/2021    11:00 AM   NUTRITION ASSESSMENT   Weight 240 lb 14.4 oz 240 lb 245 lb 8 oz 247 lb 244 lb 245 lb 6.4 oz 245 lb   Height 5' 10  5' 10 6' 0 6' 0.008 6' 0.008 6' 0   BMI (Calculated) 34.57  35.23 33.49 33.09 33.28 33.22   Method/Tool       PYP   Oz of water daily  48   Other Nutrition Comments       wife reports that she will need to meet with RD for consult       Lipids:  Total Cholesterol:   Cholesterol   Date Value Ref Range Status   07/05/2021 129 100 - 199 mg/dL Final     Cholesterol, Total   Date Value Ref Range Status   07/06/2014 235 (H) 100 - 199 MG/DL Final     Trig:   Triglycerides   Date Value Ref Range Status   07/05/2021 153 (H) <150 mg/dL Final   57/84/6962 952 0 - 149 MG/DL Final     Hdl:   HDL   Date Value Ref Range Status   07/05/2021 50 >=40 mg/dL Final   84/13/2440 41 >10 MG/DL Final     Comment:     ACCORDING TO ATP-III GUIDELINES, HDL-C >59 MG/DL IS CONSIDERED A  NEGATIVE RISK FACTOR FOR CHD.       LDL:   LDL Calculated   Date Value Ref Range Status   07/05/2021 48 (L) 60 - 99 mg/dL Final     Comment:     NHLBI Recommended Ranges, LDL Cholesterol, for Adults (20+yrs) (ATPIII), mg/dL  Optimal              <272  Near Optimal        100-129  Borderline High     130-159  High                160-189  Very High            >=190  NHLBI Recommended Ranges, LDL Cholesterol, for Children (2-19 yrs), mg/dL  Desirable            <536  Borderline High     110-129  High                 >=130     07/06/2014 170 (H) 0 - 99 MG/DL Final     UYQ0H:  No components found for: HBA1C      NUTRITION EDUCATION CLASSES:      10/10/2021     7:00 AM   NUTRITION EDUCATION   Nutrition Education Low Sodium;One to One Consult   Attend date 10/03/2021   Other comments referral to RD for 1:1 consult     Program Nutrition  Patient will be scheduled with 1:1 nutrition assessment  with program RD within the first 30 days of the program and will attend nutrition group education classes.  Picture your plate survey given to patient to complete before the 1:1 consult.      PSYCHOSOCIAL ASSESSMENT        10/03/2021    11:00 AM   PSYCHOSOCIAL ASSESSEMENT   Psychosocial Method/Tool PHQ-9   Psychosocial Score 9   Quality of Life Method/Tool Ferrans and powers   Quality of Life Score 16.11   Depression Method/Tool PHQ-9   Depression Score 9   Self-Reported Stress medium   Current State On meds   Other Psychosocial Comments takes trazadone for sleep, initial assessment completed, reports no s/s of depression/anxiety     PSYCHOSOCIAL AND DEPRESSION SCREENING FOR CARDIAC/PULMONARY REHABILITATION.      Patient currently undergoing treatment or counseling?  no  Patient currently taking medications for depression or anxiety? no  Patient reports experincing the the  following :   Social  isolation no    Family support? yes    Social support ?yes  Do you feel safe in your home? yes  Drug use/Alcohol abuse? No, hx of ETOH abuse  Signs of abuse or neglect? no  Spiritual belief system? yes            10/03/2021    11:00 AM   PSYCHOSOCIAL INTERVENTIONS   Interventions Attend stress management class;Instruct in relaxation techniques and coping skills       Quality baseline metrics were evaluated today with feedback and interpretation of findings explained to the patent and mutual goals set as listed above.           10/10/2021     7:00 AM   PSYCHOSOCIAL EDUCATION   Psychosocial Education Stress management;Relaxation Techniques   Attend date 10/10/2021   Other comments initial assessment 1:1         OTHER CORE COMPONENTS          10/03/2021    11:00 AM   BLOOD PRESSURE   Patient Goal to get to goal of 130/80   Other Goals Patient will maintain appropriate blood pressure readings less than 130/80   Interventions Monitor blood pressure at each session;Educate on importance of taking prescribed blood pressure medication correctly;Educate on importance of regular exercise program;Educate on importance of maintaining appropriate diet and weight   Progress towards goal In progress;Verbalizes understanding     Most recent BP: 168/84 (left arm BP, right arm BP 180/78 (subclavian stenosis right arm))  BP  Quality baseline metrics were evaluated today with feedback and interpretation of findings explained to the patent and mutual goals set as listed above.           10/03/2021    11:00 AM   DIABETES   Patient goal to lower HgbA1C   Other Goals Patient will maintain an acceptable blood sugar range   Interventions Educate patient on compliance with home blood sugar monitoring as prescribed by physician;Educate patient on signs and symptoms of hypo- and hyperglycemia;Instruct patient to monitor blood sugar before and after exercise   HbA1c 8.3   Blood sugar 144   Check BS at home? Yes   Progress towards goal In progress;Verbalizes understanding;Notify MD   Progress towards goal - Comments message to Dr. Hollice Espy regarding diabetes education referral     Diabetes Quality baseline metrics were evaluated today with feedback and interpretation of findings explained to the patent and mutual goals set as listed above.             10/03/2021    11:00 AM   HEART FAILURE   Patient goal at goal on entry   EF % 55   Progress towards goal Yes     Heart Failure Quality baseline metrics were evaluated today with feedback and interpretation of findings explained to the patent and mutual goals set as listed above.             10/03/2021    11:00 AM   MEDICATION COMPREHENSION   Patient goal to decrease need for nitroglycerin   Other Goals Patient will demonstrate knowledge of importance of taking meds as evidenced by verbalizing understanding of class, dosage, frequency and side effects.   Interventions Educate the patient on prescription and importance of time of day taken;Educate the patient on the class of medication, action and side effects of medication;Review dosage and frequency of medication with the patient   Medications Understood Yes   Meds taken % of time 100   Progress towards goal In progress;Verbalizes understanding     Medication Quality  baseline metrics were evaluated today with feedback and interpretation of findings explained to the patent and mutual goals set as listed above.           10/03/2021    11:00 AM   TOBACCO CESSATION   Patient goal at goal on entry   Progress towards goal Yes       Social History     Tobacco Use   Smoking Status Former   ??? Packs/day: 1.00   ??? Years: 15.00   ??? Pack years: 15.00   ??? Types: Cigarettes   ??? Quit date: 9   ??? Years since quitting: 49.5   Smokeless Tobacco Never     Tobacco  Quality baseline metrics were evaluated today with feedback and interpretation of findings explained to the patent and mutual goals set as listed above.   OTHER CORE COMPONENTS COMMENTS:      10/03/2021    11:00 AM   OTHER COMMENTS   Other comments CTO left main       OTHER CORE COMPONENTS EDUCATION CLASSES:      09/24/2021     7:00 AM 10/10/2021     7:00 AM   OTHER CORE COMPONENT EDUCATION   Core Component Ed Other Diabetes;Heart Anatomy;Medications;MI and Stoke;Risk Factors   Attend Date 09/24/2021 10/03/2021   Other Comments orientation to CR 1:1           Signed:  Pilar Plate, RN  10/10/2021 4:00 PM

## 2021-10-03 NOTE — Unmapped (Signed)
[facility]-- Medication Adherence  NO OUTREACH    Jesus Rubio is a 76 y.o. male identified as having an adherence of <80%  for a hyperlipidemia medication, based on payer reports and chart review.    [place]  [employer]    After chart and pharmacy claims review regarding the medication(s) below, patient outreach was not needed due to the following reason(s):     Rosuvastatin (Crestor) 20mg   (High Intensity) :  Medication discontinued per chart note dated 07/05/2021 by [name], MD              Aldona Bar , CPhT  Certified Pharmacy Technician  [facility]

## 2021-10-04 MED FILL — REPATHA SURECLICK 140 MG/ML SUBCUTANEOUS PEN INJECTOR: SUBCUTANEOUS | 28 days supply | Qty: 2 | Fill #3

## 2021-10-07 ENCOUNTER — Ambulatory Visit: Admit: 2021-10-07 | Discharge: 2021-10-08 | Payer: MEDICARE

## 2021-10-07 NOTE — Unmapped (Signed)
Abstraction Result Flowsheet Data    This patient's last AWV date: Jesus Rubio Last Medicare Wellness Visit Date: 12/09/2017  This patients last WCC/CPE date: : Not Found      Reason for Encounter  Reason for Encounter: Outreach  Primary Reason for Outreach: AWV  Text Message: No  MyChart Message: Yes  Outreach Call Outcome: Left message

## 2021-10-10 NOTE — Unmapped (Signed)
Dr. Hollice Espy,  Mr. Jesus Rubio DOB: [birth year], will be starting the cardiac rehab program with a to be determined date.  Upon his initial assessment, it was noted that his last HgbA1C is 8.3.  Would you like to place a referral for diabetes education?    Thanks so much  Pilar Plate, Charity fundraiser, BSN  [phone]

## 2021-10-10 NOTE — Unmapped (Signed)
CARDIAC REHABILITATION INITIAL ASSESSMENT, EXERCISE PRESCRIPTION &  INDIVIDUALIZED TREATMENT PLAN  ITP Phase: Initial   Date: 10/03/2021    Patient Name: Jesus Rubio [facility]                                              Angina Scale: 4+  Diagnosis:    Diagnosis ICD-10-CM Associated Orders   1. Chronic stable angina (CMS-HCC)  I20.8       2. 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  Additional medical conditions and/ or symptoms that will affect their exercise Rx and treatment plan include chronic pain r/t neuropathy, arthritis and mobility issues. Patient will be placed on chronic angina protocol, diabetes and fall precautions protocol.  BP 168/84 Comment: left arm BP, right arm BP 180/78 (subclavian stenosis right arm) - Pulse 92  - Resp 22  - Ht 182.9 cm (6')  - Wt (!) 111.1 kg (245 lb)  - SpO2 96%  - BMI 33.23 kg/m?? .   Did not complete exercise assessment at today's visit.  Entry depression screen was completed.  PHQ-9 Score: 9. The patient will meet with registered dietician for 1:1 consult. Will contact PCP related to possible diabetes education referral.  See exercise assessment and prescriptions below for further details including program goals. The patient reports mulitple anginal symptoms since discharge from the hospital.   Educated patient on CAD risk factors and its relationship to the disease process. Reviewed recent intervention, medication management and lifestyle modification.  **no scheduled start date at this time    CARDIOVASCULAR HISTORY AND PROCEDURES     Cardiovascular Studies Date Comments      ECG 06/07/21    Echo  10-07-21  Summary  1. The left ventricle is normal in size with normal wall thickness.  2. The left ventricular systolic function is normal, LVEF is visually estimated at > 55%.  3. The right ventricle is normal in size, with normal systolic function.        Stress test       Cardiac catheterization 06/03/21    Date of Procedure: 06/03/2021      ____________________________________________________________________________  Findings:  1. 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 70% right renal artery stenosis   3. Successful PTI of right renal artery with placement of an Onyx drug eluting stent     Recommendations:  ??? PTI performed (see details below)  ??? Aggressive secondary prevention  ??? ASA 81 mg daily indefinitely  ??? Clopidogrel 75 mg daily for at least 6 months.  ??? CYP2C19 Genotyping     Complications:  ??? None     Date of Procedure: 05/03/21     ____________________________________________________________________________  Findings:  4. Small area of lateral wall hypokinesis with preserved LV systolic function.  5. Coronary artery disease including proximal occlusion of the left main and long 70% lesion in the mid-RCA.  6. Prior coronary bypass surgery with patent LIMA to distal LAD, patent SVG  to OM1 and OM2 and patent SVG to PDA.   7. 70% right renal artery stenosis. Widely patent left renal artery.  8. Interval change since angiography on 07/14/14 was total occlusion of left main. Proximal LAD was now supplied by collaterals from PDA since occlusion of mid-LAD (present in 2016) prevented backfilling of the proximal LAD from LIMA to LAD graft.  9. Unsuccessful attempt to pass a wire through totally occluded left main.        Recommendations:  ??? Per Referring Physician  ??? Aggressive secondary prevention  ??? Of note is that there was a discrepancy in non-invasive blood pressures between his two arms when measured after the procedure: right arm was 147/70 mm Hg and left arm was 182/102 mm Hg.     Complications:  ??? None         Electrophysiology  2017?? ??The patient was sedated by the anesthesia staff with deep sedation. Once he was adequately sedated, a single biphasic 200J shock was delivered through anterior-posterior patches. This successfully converted him to normal sinus rhythm   CONCLUSIONS   Findings: Uncomplicated electrophysiology study Inducible typical atrial flutter Successful catheter ablation with creation of a cavotricuspid isthmus line of block   Plan: Oral anticoagulation     Signed electronically by: Dimple Nanas May 16, 2015 11:26 AM    ??  AACVPR Risk Stratification:             HIGH  ??? Left ventricular EF <40% at rest no  ??? MI or  cardiac surgery complicated by cardiogenic shock, CHF, and /or signs/symptoms of post -procedure ischemia  no  ??? Functional capacity < 5 METS yes  ??? Signs/symptoms including angina,pectoris , dizziness, lightheadedness or dyspnea at low levels of exercise (<5 .0 METS ) or in recoveryl no  ??? Significant silent ischemia (ST  Depression 2 mm or greater without symptoms) with exercise or in recoveryno ??? Survivor of sudden cardiac death no  ??? Complex ventricular dysrhythmias (ventricular tachycardia, frequent (>6 min) multiform PVCs) ar rest or with exercise  no  ??? Extremely severe depression  no  ??? Abnormal Hemodynamics with exercise , especially flat or decreasing systolic blood pressure or chronotropic incompetence with inccreaseing worklogadFall in exercise SBP or failure of SBP to rise more than 10 mmHg on GXTno                       Risk Factor  Defining Criteria  Yes /No    Age  Men >_ 45 yrs: Women >_55 yrs yes   Family History  Heart attck, ???bypass surger, or sudden death before the age of 20 yrs for fatehr/brother; or before 58 yrs for mother/sister yes   Cigarette smoking  Current smoker, or have quit <6 months , or is exposed to environmental smoke no   Sedentary lifestyle  Not participating in moderate (that makes you sweat) physical activity at least 3 days /week for 3 months  yes   Obesity  Body Mass Index >- 30 kg/m2 or waist girth >102 cm (40 in) for mena dn >88 cm (35 in) for wormen.  yes   Hypertension SBP> and or Diastolic >85 or taking BP medication  yes   Dyslipidemia  LDL > 70 mg/dl or HDL <29 mg /dl, or taking medication or TC >200 mg/dl  yes   Diabetes and or Pre diabetes  IFG >100 mg/dl or OGTT > 562  on 2 separate days. AIC > 6.0  yes    * Stress level           Subject reports high stress; PHQ >10, lack of                                       support system, lack of faith,  and or reports                                   anger issues             no      Allergies:       Allergies   Allergen Reactions   ??? Escitalopram Oxalate Hives, Itching and Nausea And Vomiting   ??? Ace Inhibitors     ??? Statins-Hmg-Coa Reductase Inhibitors Muscle Pain         Medications:     Current Outpatient Medications:   ???  acetaminophen (TYLENOL) 325 MG tablet, Take 2 tablets (650 mg total) by mouth every six (6) hours as needed for pain., Disp: , Rfl: 0  ???  albuterol HFA 90 mcg/actuation inhaler, Inhale 2 puffs every six (6) hours as needed for wheezing or shortness of breath. (Patient not taking: Reported on 05/17/2021), Disp: 18 g, Rfl: 0  ???  apixaban (ELIQUIS) 5 mg Tab, Take 1 tablet (5 mg total) by mouth Two (2) times a day., Disp: 180 tablet, Rfl: 3  ???  blood-glucose meter kit, Check BS every day, Disp: 1 kit, Rfl: 0  ???  clopidogreL (PLAVIX) 75 mg tablet, Take 1 tablet (75 mg total) by mouth daily., Disp: 30 tablet, Rfl: 11  ???  cyanocobalamin, vitamin B-12, 1,000 mcg/mL injection, Inject 1 mL (1,000 mcg total) under the skin every thirty (30) days., Disp: 10 mL, Rfl: 1  ???  diclofenac sodium (VOLTAREN) 1 % gel, Apply 2 g topically four (4) times a day., Disp: 100 g, Rfl: 0  ???  dilTIAZem (CARDIZEM) 30 MG tablet, 1 tab Q3 hr PRN bad palpitations, HR>110.  Be sure top number blood pressure over 95., Disp: 30 tablet, Rfl: 0  ???  empty container (SHARPS-A-GATOR DISPOSAL SYSTEM) Misc, Use as directed for sharps disposal, Disp: 1 each, Rfl: 2  ???  ergocalciferol-1,250 mcg, 50,000 unit, (DRISDOL) 1,250 mcg (50,000 unit) capsule, Take 1 capsule (1,250 mcg total) by mouth once a week., Disp: 12 capsule, Rfl: 1  ???  evolocumab (REPATHA SURECLICK) 140 mg/mL PnIj, Inject the contents of one pen (140 mg) under the skin every fourteen (14) days., Disp: 2 mL, Rfl: 11  ???  fluticasone propionate (FLONASE) 50 mcg/actuation nasal spray, 2 sprays into each nostril in the morning., Disp: 16 g, Rfl: 5  ???  glipiZIDE (GLUCOTROL) 5 MG tablet, TAKE 1 TABLET (5 MG TOTAL) BY MOUTH IN THE MORNING. FOR DIABETES.., Disp: 30 tablet, Rfl: 2  ???  isosorbide mononitrate (IMDUR) 30 MG 24 hr tablet, Take 1 tablet (30 mg total) by mouth daily., Disp: 90 tablet, Rfl: 1  ???  metoprolol succinate (TOPROL-XL) 100 MG 24 hr tablet, TAKE 2 TABLETS BY MOUTH EVERY DAY, Disp: 180 tablet, Rfl: 1  ???  miscellaneous medical supply Misc, Glucometer  Dx: E11.9, Disp: 1 each, Rfl: 0  ???  miscellaneous medical supply Misc, Glucometer test strips   Dx: E11.9, Disp: 50 each, Rfl: 5  ???  miscellaneous  medical supply Misc, Lancets  Dx  E11.9, Disp: 100 each, Rfl: 1  ???  nitroglycerin (NITROLINGUAL) 0.4 mg/dose spray, Place 1 spray under the tongue every five (5) minutes as needed for chest pain., Disp: , Rfl:   ???  nitroglycerin (NITROSTAT) 0.4 MG SL tablet, Place 1 tablet (0.4 mg total) under the tongue every five (5) minutes as needed for chest pain. Maximum of 3 doses in 15 minutes., Disp: 100 tablet, Rfl: 3  ???  omeprazole (PRILOSEC) 10 MG capsule, TAKE 1 CAPSULE BY MOUTH EVERY DAY PLUS 20 MG TO EQUAL 30 MG FOR TAPERING OFF., Disp: 90 capsule, Rfl: 1  ???  omeprazole (PRILOSEC) 20 MG capsule, Take 1 capsule (20 mg total) by mouth Two (2) times a day., Disp: 180 capsule, Rfl: 1  ???  ONETOUCH VERIO TEST STRIPS Strp, USE TO CHECK BLOOD SUGAR 1-2 TIMES PER DAY, Disp: 100 strip, Rfl: 1  ???  traZODone (DESYREL) 50 MG tablet, TAKE 2 TABLETS BY MOUTH NIGHTLY, Disp: 180 tablet, Rfl: 1     Medical History:  Past Medical History        Past Medical History:   Diagnosis Date   ??? Alcoholism (CMS-HCC)     ??? Anxiety     ??? Arthritis     ??? At risk for falls     ??? B12 deficiency     ??? CHF (congestive heart failure) (CMS-HCC)     ??? Coronary artery disease     ??? Diabetes mellitus (CMS-HCC)     ??? Financial difficulties     ??? Hearing impairment     ??? Heart disease     ??? Hyperlipemia     ??? Hypertension     ??? Joint pain     ??? Major depressive disorder     ??? Peptic ulceration     ??? PONV (postoperative nausea and vomiting)     ??? Renal disorder              Surgical History:  Past Surgical History         Past Surgical History:   Procedure Laterality Date   ??? CARDIAC SURGERY   2006     Open heart surgery   ??? CHOLECYSTECTOMY       ??? FOOT ARTHRODESIS, TRIPLE       ??? JOINT REPLACEMENT       ??? KNEE ARTHROSCOPY       ??? PR CATH PLACE/CORON ANGIO, IMG SUPER/INTERP,W LEFT HEART VENTRICULOGRAPHY N/A 05/03/2021     Procedure: Left Heart Catheterization;  Surgeon: Alvira Philips, MD;  Location: [facility] CATH;  Service: Cardiology   ??? PR COLONOSCOPY FLX DX W/COLLJ SPEC WHEN PFRMD N/A 04/03/2020     Procedure: COLONOSCOPY, FLEXIBLE, PROXIMAL TO SPLENIC FLEXURE; DIAGNOSTIC, W/WO COLLECTION SPECIMEN BY BRUSH OR WASH;  Surgeon: Bronson Curb, MD;  [facility];  Service: Gastroenterology   ??? PR COMPRE EP EVAL ABLTJ 3D MAPG TX SVT N/A 05/16/2015     Procedure: A-Flutter Ablation;  Surgeon: Dorcas Carrow, MD;  Location: [facility] EP;  Service: Cardiology   ??? PR GI IMAG INTRALUMINAL ESOPHAGUS-ILEUM W/I&R N/A 04/05/2020     Procedure: GI VIDEO TRACT IMAGE INTRALUMINAL (EG, CAPSULE ENDOSCOPY), ESOPHAGUS VIA ILEUM, PHYSICIAN INTERPRETATION & REPORT;  Surgeon: Beverly Milch, MD;  [facility]: [facility];  Service: Gastrointestinal   ??? PR REVASCULARIZE FEM/POP ARTERY,ANGIOPLASTY/STENT N/A 06/03/2021     Procedure: Peripheral Angiography W Intervetion;  Surgeon: Ernestine Mcmurray  Jaymes Graff, MD;  Location: [facility] CATH;  Service: Cardiology   ??? PR UPPER GI ENDOSCOPY,BIOPSY N/A 12/01/2013     Procedure: EGD;  Surgeon: Darletta Moll, MD;  Location: [facility];  Service: General Surgery   ??? PROSTATE SURGERY       ??? REPLACEMENT TOTAL KNEE         RIGHT   ??? THYROIDECTOMY, PARTIAL                          Cardiac Rehabilitation Initial Assessment  MEDICAL ASSESSMENT     Labs:   Lab Results   Component Value Date    Hemoglobin A1C 8.3 (A) 07/04/2021    Cholesterol 129 07/05/2021    Cholesterol, Total 235 (H) 07/06/2014       Labs:   Lab Results   Component Value Date    Hemoglobin A1C 8.3 (A) 07/04/2021    Cholesterol 129 07/05/2021    Cholesterol, Total 235 (H) 07/06/2014         CLINICAL ASSESSMENT    Physical Assessment:  BP:  168/84 (left arm BP, right arm BP 180/78 (subclavian stenosis right arm))  Location:     SpO2:  96 %  HR:  92    Anthropometrics:  Weight: (!) 111.1 kg (245 lb)  Height: 182.9 cm (6')  BMI (Calculated): (!) 33.22       Pain Assessment:    Did the patient report any pain?: Yes  0-10 Scale: 7    Cardiovascular Assessment:  Edema: Yes  Left Side: 1+ Pitting Lower extremity  Right Side: 1+ Pitting Lower extremity    Skin Integrity: Skin Integrity: Intact  Chest Incision:    Leg/Arm Assessment:               Advance directives assessment :   Advance directives reviewed with patient on initial assessment? yes    BARRIERS TO LEARNING:    Interpreter Needed:  no / Type:        Hearing Impairment:  yes / Type:          Visual Impairment:  yes / Type:       Cognitive Impairment:  no / Type:         Communication Deficit:  no / Type:        Learning Preference:   Demonstration, Explanation and Written / Type:          CARDIOVASCULAR ASSESSMENT        10/03/2021   CARDIOVASCULAR ASSESSMENT   Cardiac Rhythm Other   Heart Sounds S1;S2;Distant   Skin Color Normal   Capillary Refill Less than/equal to 3 seconds   Edema  Yes   Edema Severity Left 1+ Pitting   Left Side Location  Lower extremity   Edema Severity Right 1+ Pitting Right Side Location  Lower extremity   Other comments hx of RBBB, hx of A-fib/flutter, however last EKG showed NSR with RBBB         EXERCISE PRESCRIPTION          10/10/2021     7:00 AM   EXERCISE GOALS   Patient goal will complete exercise assessment at next visit     Program Exercise  Quality baseline metrics were evaluated today with feedback and interpretation of findings explained to the patent and mutual goals set as listed above.         EXERCISE EDUCATION CLASSES:      09/24/2021  7:00 AM   EXERCISE EDUCATION   Exercise Education Safe Exercise Parameters   Attend date 09/24/2021   Other comments contraindications to exerise     .  Chronic Chest Pain      Time  Initiated Procedure Performed RN initial    Discontinue exercise      Assist patient to seated or flat lying position     Assess the patient???s pulse, respirations, blood pressure and monitor pattern.  Assess and document type of chest pain, intensity, time of onset, and associated symptoms,  , If clinically unstable call 911 immediately.     Resolved   with rest If pain resolves in 1 to 3 minutes of rest, obtain vital signs  and if stable and pain completely resolves, the patient can continue with exercise.      Unresolved after 3 minutes of rest  Assess vital signs, and if SBP>100, administer 0.4 mg of sublingual nitroglycerin.  If pain completely resolve, and vital signs stable , notife the supervising MD. If this angina episode is consistent with his/her usual angina pattern, the patient may be discharged home at the discretion of the on site physician    Unresolved after 5 minutes from initial NTG Start oxygen at 4 liters by nasal cannula; administer a second sublingual nitroglycerin 0.4 mg if  SBP > 100.      Monitor ECG and obtain a 12 lead ECG-Assess for ischemic changes      Start IV access    Unresolved 5 minutes after second NTG Reassess pain  5 minutes after second NTG - reassess vital signs ,  repeat a third dose of NTG if pain persist and SBP > 90       If patient is transferred -call 911and prepare to transfer the patient to the ED-      Push red button to alert other MOB department staff of EMS      When [facility] staff respond-Delegate:  ??? calling 911 to scheduling staff   ??? removing patients from EMS route into the department  ??? Await EMS arrival at the back door  ??? Remove any patients from gym if needed  ??? Copy face sheet , ECG, and any other documentation for EMS       RN give report to EMS on arrival      RN call report to the receiving ED      RN complete AMB CARD/PULM incident report in progress not in EPIC- need completed even if charted on paper document and scanned to allow it to be copied and pasted into the PORS - non planned transfer report     RN Notify referring MD and PCP via routing the encounter to them in EPIC     RN scan in any ECG  and standing orders     RN complete PORS - non planned transfer report     RN document on untoward event sheet on CHAT board     RN - update patient???s treatment plan and place on medical leave until  reapproved     RN -route protocol to supervising MD and Medical Director for electronic signature. Print copy for the PI manual.      Purpose: The following standing orders will by initiated by the Cardiac / Pulmonary Rehabilitation Staff for patients presenting to the facility with a diagnosis of Diabetes who are taking any type of medications that lower blood glucose.  This protocol is for asymptomatic patients- please refer to hypoglycemia  protocol if patient reports symptoms  Scope:  Registered Nurses & Supervising Physicians must be current in ACLS:  Clinical Exercise Physiologist, Certified Nursing Assistances, and Registered Dietician must be current in BLS   Patients this applies to:  Active, established, Cardiac/Pulmonary Rehab who are taking medication for diabetes.  Excluded: individuals who have completed the entry assessment that attend a free educational orientation or wellness participants. Basic CPR and 911 is called for these individuals if they report symptoms.     Diabetic Patient    .   Date   Time Initiated Procedure Performed RN Initial    Obtain pre and post exercise blood glucose for the first week to establish the patient's response to exercise      IF pre exercise BG is less than 100 mg dl but greater than 90 and the patient reports they have eaten with in the previous hour (consumed at least 15 grams of carbohydrate, no action needed except  to monitor for signs and symptoms of low BG during exercise and ensure post exercise BG is taken..       If pre or post  exercise BG  is less than 80 mg .dl , the patient should consume 15 grams  of carbohydrate before exercise      If pre or post exercise BG is less than 70 mg dl , the patient should consume 30 grams of carbohydrate.      Document in patient's EMR             Patients presenting to outpatient cardiac and pulmonary who meet the following criteria are at increase risk for falling:   History of falling in the past year  Self report poor balance  Score less than 30 sec on the Single Leg Stand test  Score less than 8  on Sit to Stand test  Use an assistive device (cane, walker)  Use supplemental O2 or have other medical devices with tubing that could easily be a trip hazard      Cardiac and Pulmonary Rehab staff will assist patient with the following:  Track walking,  Stepping on and off the scale,  Getting on and off the cardio machines in the gym,  Provide balance training exercises     NUTRITION ASSESSMENT          10/03/2021    11:00 AM   NUTRITION GOALS   Patient goal to meet with RD for 1:1 consult   Other Goals Patient will lose 1-2 lbs. per week if BMI is greater than 25;Patient will decrease waist circumference   Progress towards goal In progress;Verbalizes understanding;Other   Goal progress comment will meet with RD for 1:1 consult           10/03/2021    11:00 AM   NUTRITION INTERVENTIONS   Interventions Attend individual consultation with the RD             07/05/2021    10:26 AM 07/10/2021     9:24 AM 07/25/2021     3:09 PM 07/31/2021    11:13 AM 08/08/2021     1:26 PM 09/04/2021     3:10 PM 10/03/2021    11:00 AM   NUTRITION ASSESSMENT   Weight 240 lb 14.4 oz 240 lb 245 lb 8 oz 247 lb 244 lb 245 lb 6.4 oz 245 lb   Height 5' 10  5' 10 6' 0 6' 0.008 6' 0.008 6' 0   BMI (Calculated) 34.57  35.23 33.49  33.09 33.28 33.22   Method/Tool       PYP   Oz of water daily       48   Other Nutrition Comments       wife reports that she will need to meet with RD for consult       Lipids:  Total Cholesterol:   Cholesterol   Date Value Ref Range Status   07/05/2021 129 100 - 199 mg/dL Final     Cholesterol, Total   Date Value Ref Range Status   07/06/2014 235 (H) 100 - 199 MG/DL Final     Trig:   Triglycerides   Date Value Ref Range Status   07/05/2021 153 (H) <150 mg/dL Final   16/01/9603 540 0 - 149 MG/DL Final     Hdl:   HDL   Date Value Ref Range Status   07/05/2021 50 >=40 mg/dL Final   98/02/9146 41 >82 MG/DL Final     Comment:     ACCORDING TO ATP-III GUIDELINES, HDL-C >59 MG/DL IS CONSIDERED A  NEGATIVE RISK FACTOR FOR CHD.       LDL:   LDL Calculated   Date Value Ref Range Status   07/05/2021 48 (L) 60 - 99 mg/dL Final     Comment:     NHLBI Recommended Ranges, LDL Cholesterol, for Adults (20+yrs) (ATPIII), mg/dL  Optimal              <956  Near Optimal        100-129  Borderline High     130-159  High                160-189  Very High            >=190  NHLBI Recommended Ranges, LDL Cholesterol, for Children (2-19 yrs), mg/dL  Desirable            <213  Borderline High     110-129  High                 >=130     07/06/2014 170 (H) 0 - 99 MG/DL Final     YQM5H:  No components found for: HBA1C      NUTRITION EDUCATION CLASSES:      10/10/2021     7:00 AM   NUTRITION EDUCATION   Nutrition Education Low Sodium;One to One Consult   Attend date 10/03/2021   Other comments referral to RD for 1:1 consult     Program Nutrition  Patient will be scheduled with 1:1 nutrition assessment  with program RD within the first 30 days of the program and will attend nutrition group education classes.  Picture your plate survey given to patient to complete before the 1:1 consult.      PSYCHOSOCIAL ASSESSMENT        10/03/2021    11:00 AM   PSYCHOSOCIAL ASSESSEMENT   Psychosocial Method/Tool PHQ-9   Psychosocial Score 9   Quality of Life Method/Tool Ferrans and powers   Quality of Life Score 16.11   Depression Method/Tool PHQ-9   Depression Score 9   Self-Reported Stress medium   Current State On meds   Other Psychosocial Comments takes trazadone for sleep, initial assessment completed, reports no s/s of depression/anxiety     PSYCHOSOCIAL AND DEPRESSION SCREENING FOR CARDIAC/PULMONARY REHABILITATION.      Patient currently undergoing treatment or counseling?  no  Patient currently taking medications for depression or anxiety? no  Patient reports experincing the  the  following :   Social  isolation no    Family support? yes    Social support ?yes      Do you feel safe in your home? yes  Drug use/Alcohol abuse? No, hx of ETOH abuse  Signs of abuse or neglect? no  Spiritual belief system? yes            10/03/2021    11:00 AM   PSYCHOSOCIAL INTERVENTIONS   Interventions Attend stress management class;Instruct in relaxation techniques and coping skills       Quality baseline metrics were evaluated today with feedback and interpretation of findings explained to the patent and mutual goals set as listed above.           10/10/2021     7:00 AM   PSYCHOSOCIAL EDUCATION   Psychosocial Education Stress management;Relaxation Techniques   Attend date 10/10/2021   Other comments initial assessment 1:1         OTHER CORE COMPONENTS          10/03/2021    11:00 AM   BLOOD PRESSURE   Patient Goal to get to goal of 130/80   Other Goals Patient will maintain appropriate blood pressure readings less than 130/80   Interventions Monitor blood pressure at each session;Educate on importance of taking prescribed blood pressure medication correctly;Educate on importance of regular exercise program;Educate on importance of maintaining appropriate diet and weight   Progress towards goal In progress;Verbalizes understanding     Most recent BP: 168/84 (left arm BP, right arm BP 180/78 (subclavian stenosis right arm))  BP  Quality baseline metrics were evaluated today with feedback and interpretation of findings explained to the patent and mutual goals set as listed above.           10/03/2021    11:00 AM   DIABETES   Patient goal to lower HgbA1C   Other Goals Patient will maintain an acceptable blood sugar range   Interventions Educate patient on compliance with home blood sugar monitoring as prescribed by physician;Educate patient on signs and symptoms of hypo- and hyperglycemia;Instruct patient to monitor blood sugar before and after exercise   HbA1c 8.3   Blood sugar 144   Check BS at home? Yes   Progress towards goal In progress;Verbalizes understanding;Notify MD   Progress towards goal - Comments message to Dr. Hollice Espy regarding diabetes education referral     Diabetes Quality baseline metrics were evaluated today with feedback and interpretation of findings explained to the patent and mutual goals set as listed above.             10/03/2021    11:00 AM   HEART FAILURE   Patient goal at goal on entry   EF % 55   Progress towards goal Yes     Heart Failure Quality baseline metrics were evaluated today with feedback and interpretation of findings explained to the patent and mutual goals set as listed above.             10/03/2021    11:00 AM   MEDICATION COMPREHENSION   Patient goal to decrease need for nitroglycerin   Other Goals Patient will demonstrate knowledge of importance of taking meds as evidenced by verbalizing understanding of class, dosage, frequency and side effects.   Interventions Educate the patient on prescription and importance of time of day taken;Educate the patient on the class of medication, action and side effects of medication;Review dosage and frequency of medication with the patient  Medications Understood Yes   Meds taken % of time 100   Progress towards goal In progress;Verbalizes understanding     Medication Quality baseline metrics were evaluated today with feedback and interpretation of findings explained to the patent and mutual goals set as listed above.           10/03/2021    11:00 AM   TOBACCO CESSATION   Patient goal at goal on entry   Progress towards goal Yes       Social History     Tobacco Use   Smoking Status Former   ??? Packs/day: 1.00   ??? Years: 15.00   ??? Pack years: 15.00   ??? Types: Cigarettes   ??? Quit date: 60   ??? Years since quitting: 49.5   Smokeless Tobacco Never     Tobacco  Quality baseline metrics were evaluated today with feedback and interpretation of findings explained to the patent and mutual goals set as listed above.   OTHER CORE COMPONENTS COMMENTS:      10/03/2021    11:00 AM   OTHER COMMENTS   Other comments CTO left main       OTHER CORE COMPONENTS EDUCATION CLASSES:      09/24/2021     7:00 AM 10/10/2021     7:00 AM   OTHER CORE COMPONENT EDUCATION   Core Component Ed Other Diabetes;Heart Anatomy;Medications;MI and Stoke;Risk Factors   Attend Date 09/24/2021 10/03/2021   Other Comments orientation to CR 1:1           Signed:  Pilar Plate, RN  10/10/2021 4:00 PM

## 2021-10-12 NOTE — Unmapped (Signed)
Addended by: Barron Alvine on: 10/11/2021 07:51 PM     Modules accepted: Orders

## 2021-10-13 NOTE — Unmapped (Signed)
I have reviewed and approved patient's initial ITP for the cardiac rehab program with noted exercise prescription and therapy goals.     Jesus Aman, MD, PhD  [facility]

## 2021-10-15 ENCOUNTER — Institutional Professional Consult (permissible substitution): Admit: 2021-10-15 | Payer: MEDICARE

## 2021-10-15 ENCOUNTER — Institutional Professional Consult (permissible substitution): Admit: 2021-10-15 | Discharge: 2021-11-13 | Payer: MEDICARE

## 2021-10-15 NOTE — Unmapped (Signed)
Patient in for initial diabetes education visit. He reports 8-10 year hx of type 2 DM.  Recent A1C was 8.3%.  He is currently taking Glipizide 5 mg in the am for his diabetes management.  He reports increased issues with neuropathy over the past year.  Patient has a One Touch Verio BGM but admits that he only checks his blood sugar 1-2 times a month.  I instructed him to check 2 times a day, fasting and 2 hours after one of his meals, and bring his log to our next visit.  He verbalized understanding and I also explained this to his wife after our visit was finished.  The patient reports that since he had Covid over a year ago, his memory has declined.  I encouraged him to invite his wife to any of our future visits.  We reviewed basic diabetes information and general nutrition guidelines. He is currently drinking mostly water and diet pepsi, but he is eating foods high in carbohydrates.  He usually has a big bowl of raisin brand cereal for breakfast and stated that he thought this was good for him.  We reviewed the different food group and spent most of time discussing carbohydrates.  Again I encouraged him to invite his wife to our next visit since she buys the groceries and cooks the meals.    The patient is scheduled to return in 4 weeks.  Will send update to his PCP regarding today's visit.  Traevion Poehler RN,  CDCES

## 2021-10-16 NOTE — Unmapped (Signed)
TC to patient regarding to starting rehab.  Scheduled to start 10/28/21 at 10:45.  Patients wife reports at this time he is going to forego right arm sx.

## 2021-10-22 ENCOUNTER — Ambulatory Visit: Admit: 2021-10-22 | Discharge: 2021-10-23 | Payer: MEDICARE | Attending: Family Medicine | Primary: Family Medicine

## 2021-10-22 DIAGNOSIS — I959 Hypotension, unspecified: Principal | ICD-10-CM

## 2021-10-22 DIAGNOSIS — G4700 Insomnia, unspecified: Principal | ICD-10-CM

## 2021-10-22 DIAGNOSIS — E1142 Type 2 diabetes mellitus with diabetic polyneuropathy: Principal | ICD-10-CM

## 2021-10-22 DIAGNOSIS — I2510 Atherosclerotic heart disease of native coronary artery without angina pectoris: Principal | ICD-10-CM

## 2021-10-22 DIAGNOSIS — G629 Polyneuropathy, unspecified: Principal | ICD-10-CM

## 2021-10-22 MED ORDER — GABAPENTIN 100 MG CAPSULE
ORAL_CAPSULE | Freq: Three times a day (TID) | ORAL | 1 refills | 30 days | Status: CP
Start: 2021-10-22 — End: 2022-10-22

## 2021-10-22 NOTE — Unmapped (Signed)
Health Maintenance Due   Topic Date Due    COVID-19 Vaccine (1) Never done    Zoster Vaccines (1 of 2) Never done    DTaP/Tdap/Td Vaccines (1 - Tdap) 04/08/2002    Hemoglobin A1c  10/04/2021           Goals & Recommendations:    Goals and Recommendations related to your health are outlined below.    If discussed and provided at your visit today: Please, keep and record your blood sugar, blood pressure, weight, fluid intake, dietary intake and exercise amount and frequency.     Blood Pressure Goals (unless otherwise instructed):  Check 2-3 times a week.  Typical average out of office measurement: <135 mmHg systolic and < 85 mmHg diastolic (less than 135/85).  More strict recommendations for out of office measurement: < 130 mmHg systolic and < 80 mmHg diastolic (less than 130/80).  Typical in office measurement <140 mmHg systolic and <90 mmHg diastolic (less than 140/90).  Most people will not feel well if blood pressure drops to less than 110/70, unless they typically have blood pressures in this range.   If you are noticing sudden increases or decreases in blood pressure, then please contact the office.    Diabetes: Goal Hemoglobin A1c 7.9 or lower.  Goal Blood Sugars Fasting: 60-140 (may check fasting and two hours after a meal later in the day).    For Monitoring your Mood your PHQ-9 Goal is less than 10.    Drink 48 ounces of water per day.  Avoid sweetened beverages.  You will generally feel better and experience improved health when you limit, and if possible avoid, processed foods.  Diets high in plant based foods, vegetables and fruits are associated with many health benefits.    Exercise for a minimum of 20 minutes daily. Minimum of four days a week.   If possible try to obtain 60 minutes of exercise daily for five or more days per week.  Regular movement throughout the day is best.   Walking is a good form of movement.   Squats, lunges, step-ups, push-ups, planks, and arm exercises with or without weights or resistance bands are good forms of exercise.    If your BMI (body mass index) is above 25 then your body is carrying more weight than is typically healthy.  If your BMI (body mass index) is above 25 and you are not achieving the healthy weight goals that you desire, then consider the following:  Keep calories below 2500 calories per day unless otherwise instructed. Remember that all calories are not equal.  Keep a food journal and record accurately.  Read labels and ingredient lists on the products that you consume.  Sometimes we need to adjust your level of physical activity, and or tailor an exercise program more specifically for your needs, just ask and we will come up with a plan.  If you have questions about any of these recommendations, please ask.    If you smoke, stop.  If you have heart failure or severe kidney disease: weigh yourself daily and maintain fluid intake to less than 2 Liters daily unless otherwise instructed.  If you have diabetes you need to see the eye doctor at least every one to two years and have a yearly foot exam.  Set aside time daily for relaxation.  Plan a pleasant activity that you can look forward to and enjoy.  Medication changes are outlined on your After Visit Summary.  Any referrals placed  today will be outlined on your After Visit Summary.    If you have questions, please call the office or use MyChart.   If you need a refill on a medication, please contact your pharmacy.  Please, allow at least two business days for responses to refill requests.    Please, bring all medications and when possible original prescription containers to each and every office visit.    If you have been provided and / or requested to record your blood sugars, blood pressures, weight, fluid intake, dietary intake, or exercise, then please bring records to each and every appointment.    When you see other providers for health care, please request that they send information and records related to your visit to our office. This includes vaccinations, mammograms, radiology, procedures, diagnostic testing, lab testing and physical exams or wellness exams that you have outside of the [facility].      [facility]  [address]  [place], [place] [address]  Office: [phone]  Fax: [phone]             Learning About Mindfulness for Stress  What are mindfulness and stress?    Stress is what you feel when you have to handle more than you are used to. A lot of things can cause stress. You may feel stress when you go on a job interview, take a test, or run a race. This kind of short-term stress is normal and even useful. It can help you if you need to work hard or react quickly.  Stress also can last a long time. Long-term stress is caused by stressful situations or events. Examples of long-term stress include long-term health problems, ongoing problems at work, and conflicts in your family. Long-term stress can harm your health.  Mindfulness is a focus only on things happening in the present moment. It's a process of purposefully paying attention to and being aware of your surroundings, your emotions, your thoughts, and how your body feels. You are aware of these things, but you aren't judging these experiences as good or bad. Mindfulness can help you learn to calm your mind and body to help you cope with illness, pain, and stress.  How does mindfulness help to relieve stress?  Mindfulness can help quiet your mind and relax your body. Studies show that it can help some people sleep better, feel less anxious, and bring their blood pressure down. And it's been shown to help some people live and cope better with certain health problems like heart disease, depression, chronic pain, and cancer.  How do you practice mindfulness?  To be mindful is to pay attention, to be present, and to be accepting.  When you're mindful, you do just one thing and you pay close attention to that one thing. For example, you may sit quietly and notice your emotions or how your food tastes and smells.  When you're present, you focus on the things that are happening right now. You let go of your thoughts about the past and the future. When you dwell on the past or the future, you miss moments that can heal and strengthen you. You may miss moments like hearing a child laugh or seeing a friendly face when you think you're all alone.  When you're accepting, you don't judge the present moment. Instead you accept your thoughts and feelings as they come.  You can practice anytime, anywhere, and in any  way you choose. You can practice in many ways. Here are a few ideas:  While doing your chores, like washing the dishes, let your mind focus on what's in your hand. What does the dish feel like? Is the water warm or cold?  Go outside and take a few deep breaths. What is the air like? Is it warm or cold?  When you can, take some time at the start of your day to sit alone and think.  Take a slow walk by yourself. Count your steps while you breathe in and out.  Try yoga breathing exercises, stretches, and poses to strengthen and relax your muscles.  At work, if you can, try to stop for a few moments each hour. Note how your body feels. Let yourself regroup and let your mind settle before you return to what you were doing.  If you struggle with anxiety or worry thoughts, imagine your mind as a blue sky and your worry thoughts as clouds. Now imagine those worry thoughts floating across your mind's sky. Just let them pass by as you watch.  Follow-up care is a key part of your treatment and safety. Be sure to make and go to all appointments, and call your doctor if you are having problems. It's also a good idea to know your test results and keep a list of the medicines you take.  Where can you learn more?  Go to [facility] at [email or web address]  Select Preferences in the upper right hand corner, then select Health Library under Resources. Enter [phone] in the search box to learn more about Learning About Mindfulness for Stress.  Current as of: January 15, 2016  Content Version: 11.6  ?? 2006-2018 [employer]. Care instructions adapted under license by [facility]. If you have questions about a medical condition or this instruction, always ask your healthcare professional. [employer], Inc[employer] disclaims any warranty or liability for your use of this information.         Learning About Guided Imagery for Stress  What are guided imagery and stress?    Stress is what you feel when you have to handle more than you are used to. A lot of things can cause stress. You may feel stress when you go on a job interview, take a test, or run a race. This kind of short-term stress is normal and even useful. It can help you if you need to work hard or react quickly.  Stress also can last a long time. Long-term stress is caused by stressful situations or events. Examples of long-term stress include long-term health problems, ongoing problems at work, and conflicts in your family. Long-term stress can harm your health.  Guided imagery is a technique of directed thoughts and suggestions that guide your mind toward a relaxed, focused state. This technique helps you use your mind to take you to a calm, peaceful place. You can use it to relax, which can lower blood pressure and reduce other problems related to stress.  How does guided imagery help to relieve stress?  Because of the way the mind and body are connected, guided imagery can make you feel like you are experiencing something just by imagining it. You can achieve a relaxed state when you imagine all the details of a safe, comfortable place, such as a beach or a garden. This relaxed state may help you feel more in control of your emotions and thought processes. Feeling in control may improve your attitude, health, and sense of well-being.  How do you do guided imagery?  You can use a smartphone app or a video to lead you through the process. You use all of your senses in guided imagery. For example, if you want a tropical setting, you can imagine the warm breeze on your skin, the bright blue of the water, the sound of the surf, the sweet scent of tropical flowers, and the taste of coconut. Imagining those things can make you actually feel like you're there.  But you don't have to imagine the tropics to feel peace. Guided imagery can take you to your own restful place. To give guided imagery a try, follow these steps:  Lean back comfortably in your chair. If you can, close your eyes. Put your arms on the armrests, or fold your hands in your lap.  Take a deep breath through your nose. Breathe in slowly, and then let the air out completely through your mouth.  Do it again slowly. Keep breathing like this. Gather up any tension in your body, and send it out with every breath.  Let a feeling of warmth spread from your lungs to your neck and head, down your arms to your fingertips, through your body and into your legs, all the way down to your toes. Stay this way for a moment.  Now imagine a pleasant day. You're at a park or at a place you've visited in the past where you felt at peace.  In your mind's eye, look at what lies in front of you. Maybe you see the sun, filtered through trees. Maybe clouds are drifting by.  Look to one side, and then the other. Notice the feel of the air around you. Notice how it feels on your face and on your arms.  Stay here for a while. Let it become real for you.  Follow-up care is a key part of your treatment and safety. Be sure to make and go to all appointments, and call your doctor if you are having problems. It's also a good idea to know your test results and keep a list of the medicines you take.  Where can you learn more?  Go to [facility] at [email or web address]  Select Preferences in the upper right hand corner, then select Health Library under Resources. Enter N291 in the search box to learn more about Learning About Guided Imagery for Stress.  Current as of: January 15, 2016  Content Version: 11.6  ?? 2006-2018 [employer]. Care instructions adapted under license by [facility]. If you have questions about a medical condition or this instruction, always ask your healthcare professional. [employer] disclaims any warranty or liability for your use of this information.        Learning About the Mediterranean Diet  What is the Mediterranean diet?    The Mediterranean diet is a style of eating rather than a diet plan. It features foods eaten in [place], [place], southern [place], and other countries along the [employer]. It emphasizes eating foods like fish, fruits, vegetables, beans, high-fiber breads and whole grains, nuts, and olive oil. This style of eating includes limited red meat, cheese, and sweets.  Why choose the Mediterranean diet?  A Mediterranean-style diet may improve heart health. It contains more fat than other heart-healthy diets. But the fats are mainly from nuts, unsaturated oils (such as fish oils and olive oil), and certain nut or seed oils (such as canola, soybean, or flaxseed oil). These fats may help protect the heart and blood  vessels.  How can you get started on the Mediterranean diet?  Here are some things you can do to switch to a more Mediterranean way of eating.  What to eat  Eat a variety of fruits and vegetables each day, such as grapes, blueberries, tomatoes, broccoli, peppers, figs, olives, spinach, eggplant, beans, lentils, and chickpeas.  Eat a variety of whole-grain foods each day, such as oats, brown rice, and whole wheat bread, pasta, and couscous.  Eat fish at least 2 times a week. Try tuna, salmon, mackerel, lake trout, herring, or sardines.  Eat moderate amounts of low-fat dairy products, such as milk, cheese, or yogurt.  Eat moderate amounts of poultry and eggs.  Choose healthy (unsaturated) fats, such as nuts, olive oil, and certain nut or seed oils like canola, soybean, and flaxseed.  Limit unhealthy (saturated) fats, such as butter, palm oil, and coconut oil. And limit fats found in animal products, such as meat and dairy products made with whole milk. Try to eat red meat only a few times a month in very small amounts.  Limit sweets and desserts to only a few times a week. This includes sugar-sweetened drinks like soda.  The Mediterranean diet may also include red wine with your meal--1 glass each day for women and up to 2 glasses a day for men.  Tips for eating at home  Use herbs, spices, garlic, lemon zest, and citrus juice instead of salt to add flavor to foods.  Add avocado slices to your sandwich instead of bacon.  Have fish for lunch or dinner instead of red meat. Brush the fish with olive oil, and broil or grill it.  Sprinkle your salad with seeds or nuts instead of cheese.  Cook with olive or canola oil instead of butter or oils that are high in saturated fat.  Switch from 2% milk or whole milk to 1% or fat-free milk.  Dip raw vegetables in a vinaigrette dressing or hummus instead of dips made from mayonnaise or sour cream.  Have a piece of fruit for dessert instead of a piece of cake. Try baked apples, or have some dried fruit.  Tips for eating out  Try broiled, grilled, baked, or poached fish instead of having it fried or breaded.  Ask your server to have your meals prepared with olive oil instead of butter.  Order dishes made with marinara sauce or sauces made from olive oil. Avoid sauces made from cream or mayonnaise.  Choose whole-grain breads, whole wheat pasta and pizza crust, brown rice, beans, and lentils.  Cut back on butter or margarine on bread. Instead, you can dip your bread in a small amount of olive oil.  Ask for a side salad or grilled vegetables instead of french fries or chips.  Where can you learn more?  Go to [facility] at [email or web address]  Select Health Library under the Resources menu. Enter O407 in the search box to learn more about Learning About the Mediterranean Diet.  Current as of: February 11, 2017  Content Version: 12.2  ?? 2006-2019 [employer]. Care instructions adapted under license by [facility]. If you have questions about a medical condition or this instruction, always ask your healthcare professional. [employer] disclaims any warranty or liability for your use of this information.            Stretching: Exercises  Your Care Instructions  Here are some examples of exercises for stretching. Start each exercise slowly. Ease off the exercise if you start  to have pain.  Your doctor or physical therapist will tell you when you can start these exercises and which ones will work best for you.  How to do the exercises  Latissimus stretch    Stand with your back straight and your feet shoulder-width apart. You can do this stretch sitting down if you are not steady on your feet.  Hold your arms above your head, and hold one hand with the other.  Pull upward while leaning straight over toward your right side. Keep your lower body straight. You should feel the stretch along your left side.  Hold 15 to 30 seconds, and then switch sides.  Repeat 2 to 4 times for each side.  Triceps stretch    Stand with your back straight and your feet shoulder-width apart. You can do this stretch sitting down if you are not steady on your feet.  Bring your left elbow straight up while bending your arm.  Grab your left elbow with your right hand, and pull your left elbow toward your head with light pressure. If you are more flexible, you may pull your arm slightly behind your head. You will feel the stretch along the back of your arm.  Hold 15 to 30 seconds, and then switch elbows.  Repeat 2 to 4 times for each arm.  Calf stretch    Place your hands on a wall for balance. You can also do this with your hands on the back of a chair, a countertop, or a tree.  Step back with your left leg. Keep the leg straight, and press your left heel into the floor.  Press your hips forward, bending your right leg slightly. You will feel the stretch in your left calf.  Hold the stretch 15 to 30 seconds.  Repeat 2 to 4 times for each leg.  Quadriceps stretch    Lie on your side with one hand supporting your head.  Bend your upper leg back and grab your ankle with your other hand.  Stretch your leg back by pulling your foot toward your buttocks. You will feel the stretch in the front of your thigh. If this causes stress on your knees, do not do this stretch.  Hold the stretch 15 to 30 seconds.  Repeat 2 to 4 times for each leg.  Groin stretch    Sit on the floor and put the soles of your feet together. Do not slump your back.  Grab your ankles and gently pull your legs toward you.  Press your knees toward the floor. You will feel the stretch in your inner thighs.  Hold 15 to 30 seconds.  Repeat 2 to 4 times.  Hamstring stretch in doorway    Lie on the floor near a doorway, with your buttocks close to the wall.  Let the leg you are not stretching extend through the doorway.  Put the leg you want to stretch up on the wall, and straighten your knee to feel a gentle stretch at the back of your leg.  Hold the stretch for at least 15 to 30 seconds. Repeat 2 to 4 times.  Follow-up care is a key part of your treatment and safety. Be sure to make and go to all appointments, and call your doctor if you are having problems. It's also a good idea to know your test results and keep a list of the medicines you take.  Where can you learn more?  Go to [facility] at [email or web address]  Select Preferences  in the upper right hand corner, then select Health Library under Resources. Enter P733 in the search box to learn more about Stretching: Exercises.  Current as of: March 13, 2016  Content Version: 11.6  ?? 2006-2018 [employer]. Care instructions adapted under license by [facility]. If you have questions about a medical condition or this instruction, always ask your healthcare professional. [employer] disclaims any warranty or liability for your use of this information.

## 2021-10-26 NOTE — Unmapped (Signed)
Assessment/Plan     Diagnoses and all orders for this visit:    Peripheral polyneuropathy  Persistent symptoms.  Willing to trial gabapentin.  Standard education guidance.  Begin gabapentin 100 mg 3 times a day.  Precautions reviewed.  -     gabapentin (NEURONTIN) 100 MG capsule; Take 1 capsule (100 mg total) by mouth Three (3) times a day.    Type 2 diabetes mellitus with diabetic polyneuropathy, without long-term current use of insulin (CMS-HCC)  Stable at present.  Continue current plan of care.    Insomnia, unspecified type  Improved compared to previous.  Continue current plan of care.    Coronary artery disease involving native coronary artery of native heart, unspecified whether angina present  Clinically asymptomatic.   Standard education and anticipatory guidance regarding goals, objectives, and sequelae.  Continue current management and plan of care.  Continue aggressive risk factor reduction.    Hypotension, unspecified hypotension type  Noted previously.  Currently asymptomatic.  Standard education guidance.  Precautions reviewed.    Disposition: Return in about 3 weeks (around 11/12/2021) for Recheck Peripheral Neuropathy.  11/13/2021      Diagnoses and plan along with any newly prescribed medication(s), recommendations, orders and therapies were discussed in detail with patient today.   Standard education and anticipatory guidance regarding goals, objectives and potential sequelae were reviewed. Patient voiced appropriate understanding of assessment and plan of care.    Pertinent handouts were given today and reviewed with the patient as indicated.  The Care Plan and Self-Management goals have been included on the AVS and the AVS has been printed.  Where helpful for monitoring and management I have encouraged the patient to keep regular logs for me to review at their next visit. Any outside resources or referrals needed at this time are noted above. No additional referrals necessary at this time. Patient voiced understanding and all questions have been answered to satisfaction.     To complete this documentation in a timely manner, portions of this encounter may have been completed utilizing Dragon voice recognition software. Please take this into consideration in noting nuances of documentation, grammar, punctuation, and wording. If and when a question exists, or for clarification, please contact the author.    Barron Alvine, MD  [facility]  [address]  [place], [name] [address]  Phone [phone]    Subjective:     WUX:[id number] [name], MD    Jesus Rubio, is a 76 y.o. male    Chief Complaint   Patient presents with    Neuropathy     Patient states that he has numbness and tingling in both feet. Patient also states that he has sore on both feet that he would like you to look at.     Insomnia     Patient states that his sleep has improved  states he is sleeping a little better     Medication Problem     Patient would like to discuss his medications with you states on of them is to much for him to purchase        HPI:    Patient presents: with wife.    Nursing notes and history reviewed with patient and wife.    Peripheral neuropathy.  Patient reports ongoing numbness tingling bilateral feet.  Symmetric.  Feet are sore.  No new skin breakdown.  Lyrica is been recommended previously cost prohibitive.  He is taking gabapentin in the distant  past and thought may be too sedating.    Diabetes mellitus type 2.  Goal A1c less than 8.  No hypo or hyperglycemic symptoms.  Compliant medication regimen denies side effects.    Insomnia.  Improved compared with previous.  Denies new or additional concern related symptoms.    Coronary artery disease. Compliant with therapy. No chest pain, shortness of breath, orthopnea, PND.  No increase nitroglycerin use.  No bleeding complications from aspirin therapy noted.    Hypotension.  Noted previously.  No recent increase in fatigue dizziness chest pain shortness of breath presyncope or syncope.      Answers submitted by the patient for this visit:  Neurological Problem Questionnaire (Submitted on 10/18/2021)  Chief Complaint: Neurologic complaint  altered mental status: Yes  clumsiness: Yes  focal sensory loss: Yes  focal weakness: Yes  loss of balance: Yes  memory loss: Yes  Chronicity: chronic  Onset: more than 1 year ago  Onset quality: gradually  Progression since onset: unchanged  Focality: right-sided, lower extremity    No other complaints.  No additional recent health care visits elsewhere. No urgent care or ED visits.  No additional palliative, provocative, modifying, quantifying, qualifying, or other related factors.  Unless otherwise outlined above patient is pleased with current medication regimen, admits compliance and denies side effects of medication.    The following portions of the patient's history were reviewed and updated as appropriate: allergies, current medications, past family history, past medical history, past social history, past surgical history and problem list.    ROS: Pertinent positives and negatives are as outlined in the HPI above. The remaining balance of the 12 point review of systems is otherwise benign.      PCMH Components:      Goals         <130/80       Lack of exercise or physical activity    Increase exercise or physical activity          Get heart rate stabilized (pt-stated)         Things to think about to help me reach my goal:     What are you going to do? Take medications as prescribed, Keep positive thinking, Pace activities, Stay hydrated   How and how much? Daily   How frequent? Daily   Barriers to success?    Solutions to barriers?      06/04/17: Patient continues to work on this goal   09/29/17 Patient continues to work on this goal   12-09-2017: Reports he continues to work on this and is staying positive.        Take actions to prevent falling       12-03-2016: New goal: Will change positions slowly and pay attention to pathways. Uses cane or walker at times.   09/29/17 Patient continues to work on this goal   12-09-2017: Reports 1 fall 6-8 months ago when he missed a step and fell. Denies injury. Uses cane occasionally.                Family characteristics, patient's social characteristics, patient's cultural characteristics, patient's communication needs, patient's health literacy and behaviors affecting patient's health are outlined under the patient's History Section, Longitudinal Care Plan or Problem List in Epic or as pertinent in History of Present Illness.    Barriers to goals identified and addressed.   See Assessment and Plan for additional pertinent details.    Objective:     Physical Exam  BP 116/64  - Pulse 77  - Temp 36.9 ??C (98.5 ??F) (Oral)  - Resp 18  - Ht 182.9 cm (6' 0.01)  - Wt (!) 111.6 kg (246 lb)  - SpO2 95%  - BMI 33.36 kg/m??   Constitutional: Oriented to person, place, and time. Appears well-developed and well-nourished. No distress.   Head: Normocephalic and atraumatic.   Mouth/Throat: Oropharynx is without mass or concerning lesions. Moist mucous membranes.    Eyes: Conjunctivae are normal. No scleral icterus.   Neck: Neck supple. Normal range of motion. No JVD present. No thyromegaly present. No concerning neck mass.   Cardiovascular: Normal rate. Regular rhythm. Normal heart sounds. No murmur heard.  Pulmonary/Chest: Effort normal. No respiratory distress. Breath sounds normal. No wheezes. No rhonchi. No rales. No focal breath sound changes. No chest tenderness.  Abdominal: Soft. Non-distended. No abdominal mass. No tenderness. No rebound. No guarding.   Musculoskeletal: No active synovitis. No concerning joint erythema, warmth or swelling.   Range of motion is without evidence of significant impairment.   Strength is without evidence of significant impairment.   No edema of the extremities.   Lymphadenopathy: There is no lymphadenopathy about the head or neck.  Neurological: Alert and oriented to person, place, and time. No cranial nerve deficit. Normal muscle tone. Normal coordination.  Skin: Skin is warm and dry. No pallor. No rash noted.   Psychiatric: Normal mood, affect and behavior. Judgment and thought content normal.

## 2021-10-28 ENCOUNTER — Institutional Professional Consult (permissible substitution): Admit: 2021-10-28 | Payer: MEDICARE

## 2021-10-28 ENCOUNTER — Institutional Professional Consult (permissible substitution): Admit: 2021-10-28 | Discharge: 2021-11-22 | Payer: MEDICARE

## 2021-10-28 ENCOUNTER — Ambulatory Visit: Admit: 2021-10-28 | Discharge: 2021-11-22 | Payer: MEDICARE

## 2021-10-28 ENCOUNTER — Ambulatory Visit: Admit: 2021-10-28 | Payer: MEDICARE

## 2021-10-29 NOTE — Unmapped (Signed)
CARDIAC REHABILITATION  INDIVIDUALIZED TREATMENT PLAN  ITP Phase: 30 day   Date: 10/28/2021    Patient Name: Jesus Rubio                                           AACVPR Risk: High Risk  Angina Scale: 4+  Diagnosis:    Diagnosis ICD-10-CM Associated Orders   1. Chronic stable angina (CMS-HCC)  I20.8       2. 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  His BP was at goal 128/75 with adequate resting HR.  He reports that his angina has been better over last 2-3 weeks.  He had no c/o angina with exercise today.  Patient is notably deconditioned and will work towards improving his overall strength and endurance.  He was able to complete 15 minutes on seated elliptical with MET level of 1.7.  Will progress slowly.  Several co-morbidities with high risk CAD.  THR set at rest+20-30.            CARDIOVASCULAR ASSESSMENT        10/03/2021 10/28/2021   CARDIOVASCULAR ASSESSMENT   Cardiac Rhythm Other Regular   Heart Sounds S1;S2;Distant    Skin Color Normal    Capillary Refill Less than/equal to 3 seconds    Edema  Yes    Edema Severity Left 1+ Pitting    Left Side Location  Lower extremity    Edema Severity Right 1+ Pitting    Right Side Location  Lower extremity    Other comments hx of RBBB, hx of A-fib/flutter, however last EKG showed NSR with RBBB        EXERCISE PRESCRIPTION          Cardiac Rehabilitation                                   Exercise Prescription Form    Recommendations:  THR= resting+20-30, Chronic angina protocol, diabetic precautions, orthopedic precautions, continuous telemetry, fall precautions        Exercise and functional assessment:    ?? symptoms with exercise:  no;    ?? Exercise HR and BP response: appropriate  ?? SpO2 desaturation with exercise: yes; If yes, describe: 93% on RA, did not require oxygen at low workloads  ?? Recovery HR and BP -return to baseline within 5 minutes or was prolonged? returned to baseline    ?? Functional capacity goals for program:    ?? - 40% increase in MET level via Sub Max WL on 3rd visit OR   ?? - 15% GXT OR   ?? - increase footage by 10%.     Fall risk:   Fallen in the last year?  yes     Baseline SLS: below average  Use of assistive device? yes  Baseline hamstring and lower back flexibility: below average      Current activity:   Formal exercise program in place on entry? no  Past history of formal exercise program? no  Orthopedic limitations? Yes, bilateral arm pain with BLE neuropathy             Signed: Pilar Plate, RN  10/28/2021 5:05 PM    EXERCISE GOALS:      10/10/2021     7:00 AM 10/28/2021    10:42 AM   EXERCISE GOALS   Patient goal will complete exercise assessment at next visit to increase stamina and strength   Other Goals  Patient will demonstrate an ability to take their own pulse;Patient will demonstrate an understanding of their exercise prescription;Patient will demonstrate knowledge of safe exercise parameters;Patient will increase the duration and/or intensity of exercise;Increase MET level as appropriate   Goal progress comment  AEB starting cardiac rehab program   Progress towards goal  In Progress;Verbalizes understanding         EXERCISE INTERVENTIONS:      10/28/2021    10:42 AM   EXERCISE INTERVENTIONS   Interventions Educate patient on exercise prescription, THRR, RPE scale, and MET level;Educate patient on normal/abnormal response to exercise;Educate patient on pulse-taking techniques;Orient patient to equipment safety guidelines;Participate in warm-up and cool-down       EXERCISE ASSSESSMENT:      10/03/2021    11:00 AM 10/28/2021    10:42 AM   EXERCISE PRESCRIPTION   ITP Phase Initial 30 day   Special Precautions / Protocols Indicated  Continuous telemetry;Diabetic precautions;Fall precautions;Orthopedic precautions;Other   Supplemental O2 Required?  No   Type of exercise test  GXT   GXT (MET Level)  1.7       AEROBIC CONDITIONING / EXERCISE PLAN:      10/28/2021    10:42 AM   AEROBIC CONDITIONING / EXERCISE PLAN   Frequency per day 1x per day   Frequency per week 2xs weekly   Aerobic minutes per session 31 - 45   RPE/BORG 3 - 6   THRR Low 95   THRR High 105   Determined Predicted HR Rest + 20-30 bpm   Rehab Progression Decrease rest interval duration;Gradually increase frequency and duration;Other   Progression Comments establish aerobic base of 30 minutes then progress workloads by 2% every 2 weeks       AEROBIC CONDITIONING / MODES OF EXERCISE:      10/28/2021    10:42 AM   AEROBIC CONDITIONING / MODES OF EXERCISE:   Seated Elliptical Yes   Level 1   Mets 1.7   Watts 15       STRENGTH TRAINING / EXERCISE PLAN:      10/28/2021    10:42 AM   RESISTANCE TRAINING   Resistance Training Yes   Modes theraband;free weights;body weight   Frequency 2 x week   Intensity / RPE 3 - 6   Repetitions 8 - 10   Sets 2   Limitations or precautions orthopedics   Progression Increase resistance amount when patient successfully completes the prescribed number of  sets and reps with RPE below 3 (1-10 scale)       OTHER TRAINING / EXERCISE PLAN:      10/28/2021    10:42 AM   OTHER TRAINING   Balance Training Yes - Daily dynamic balance exercises in warm up and cool down phase of aerobic exercise session.   Balance Score 3   Flexibility training  type static stretching post cardio or resistance training for all major muscle groups x 16 - 30sec duration.   Flexibility R/L Right;Left   Right score -6   Left score -5.5   Lower Body Strength 4   Home Exercise No   Other Exercise Comments no formal HEP       EXERCISE EDUCATION CLASSES:      09/24/2021     7:00 AM 10/28/2021     7:00 AM   EXERCISE EDUCATION   Exercise Education Safe Exercise Parameters Exercise Prescription;Safe Exercise Parameters;Strength Training   Attend date 09/24/2021 10/28/2021   Other comments contraindications to exerise          NUTRITION ASSESSMENT    NUTRITION GOALS:      10/03/2021    11:00 AM 10/28/2021    10:42 AM   NUTRITION GOALS   Patient goal to meet with RD for 1:1 consult to meet with RD for 1:1 consult   Other Goals Patient will lose 1-2 lbs. per week if BMI is greater than 25;Patient will decrease waist circumference Patient will lose 1-2 lbs. per week if BMI is greater than 25;Patient will decrease waist circumference   Progress towards goal In progress;Verbalizes understanding;Other In progress;Verbalizes understanding;Other   Goal progress comment will meet with RD for 1:1 consult will meet with RD for 1:1 consult       NUTRITION INTERVENTIONS:      10/03/2021    11:00 AM 10/28/2021    10:42 AM   NUTRITION INTERVENTIONS   Interventions Attend individual consultation with the RD Attend individual consultation with the RD       NUTRITION ASSESSMENT:      07/25/2021     3:09 PM 07/31/2021    11:13 AM 08/08/2021     1:26 PM 09/04/2021     3:10 PM 10/03/2021    11:00 AM 10/22/2021     2:25 PM 10/28/2021    10:42 AM   NUTRITION ASSESSMENT   Weight 245 lb 8 oz 247 lb 244 lb 245 lb 6.4 oz 245 lb 246 lb 245 lb   Height 5' 10 6' 0 6' 0.008 6' 0.008 6' 0 6' 0.008    BMI (Calculated) 35.23 33.49 33.09 33.28 33.22 33.36    Waist Circumference       46   Method/Tool     PYP     Oz of water daily     48  48   Other Nutrition Comments     wife reports that she will need to meet with RD for consult  wife reports that she will need to meet with RD for consult       Lipids:  Total Cholesterol:   Cholesterol   Date Value Ref Range Status   07/05/2021 129 100 - 199 mg/dL Final     Cholesterol, Total   Date Value Ref Range Status   07/06/2014 235 (H) 100 - 199 MG/DL Final     Trig:   Triglycerides   Date Value Ref Range Status   07/05/2021 153 (H) <150 mg/dL Final   82/95/6213 086 0 - 149 MG/DL Final     Hdl:   HDL   Date Value Ref Range Status   07/05/2021 50 >=40 mg/dL Final   57/84/6962 41 >95 MG/DL Final     Comment:     ACCORDING TO ATP-III GUIDELINES, HDL-C >59 MG/DL IS CONSIDERED A  NEGATIVE RISK FACTOR FOR CHD.       LDL:  LDL Calculated   Date Value Ref Range Status   07/05/2021 48 (L) 60 - 99 mg/dL Final     Comment:     NHLBI Recommended Ranges, LDL Cholesterol, for Adults (20+yrs) (ATPIII), mg/dL  Optimal              <161  Near Optimal        100-129  Borderline High     130-159  High                160-189  Very High            >=190  NHLBI Recommended Ranges, LDL Cholesterol, for Children (2-19 yrs), mg/dL  Desirable            <096  Borderline High     110-129  High                 >=130     07/06/2014 170 (H) 0 - 99 MG/DL Final       NUTRITION EDUCATION CLASSES:      10/10/2021     7:00 AM   NUTRITION EDUCATION   Nutrition Education Low Sodium;One to One Consult   Attend date 10/03/2021   Other comments referral to RD for 1:1 consult         PSYCHOSOCIAL ASSESSMENT    PSYCHOSOCIAL GOALS:      10/03/2021    11:00 AM 10/28/2021    10:42 AM   PSYCHOSOCIAL GOALS   Other Goals Patient will improve psychosocial assessment scores;Patient will maximize coping skills;Patient will demonstrate relaxation techniques;Patient will identify support systems;Patient will report decreased stress levels Patient will improve psychosocial assessment scores;Patient will maximize coping skills;Patient will demonstrate relaxation techniques;Patient will identify support systems;Patient will report decreased stress levels   Patient Goal to have more stamina and feel better, improve quality of life to have more stamina and feel better, improve quality of life   Progress towards goal In progress;Verbalizes understanding In progress;Verbalizes understanding   Goal progress comment initial goals set and interventions initiated AEB PHQ-9=9       PSYCHOSOCIAL INTERVENTIONS:      10/03/2021    11:00 AM 10/28/2021    10:42 AM   PSYCHOSOCIAL INTERVENTIONS   Interventions Attend stress management class;Instruct in relaxation techniques and coping skills Attend stress management class;Instruct in relaxation techniques and coping skills       PSYCHOSOCIAL ASSESSMENT:      10/03/2021    11:00 AM 10/28/2021    10:42 AM   PSYCHOSOCIAL ASSESSEMENT   Psychosocial Method/Tool PHQ-9 PHQ-9   Psychosocial Score 9 9   Quality of Life Method/Tool Ferrans and powers    Quality of Life Score 16.11    Depression Method/Tool PHQ-9    Depression Score 9    Self-Reported Stress medium medium   Current State On meds On meds   Other Psychosocial Comments takes trazadone for sleep, initial assessment completed, reports no s/s of depression/anxiety takes trazadone for sleep,  reports no s/s of depression/anxiety       PSYCHOSOCIAL EDUCATION CLASSES:      10/10/2021     7:00 AM   PSYCHOSOCIAL EDUCATION   Psychosocial Education Stress management;Relaxation Techniques   Attend date 10/10/2021   Other comments initial assessment 1:1         OTHER CORE COMPONENTS    BLOOD PRESSURE:      10/03/2021    11:00 AM 10/28/2021    10:42 AM   BLOOD PRESSURE  Patient Goal to get to goal of 130/80 to get to goal of 130/80   Other Goals Patient will maintain appropriate blood pressure readings less than 130/80 Patient will maintain appropriate blood pressure readings less than 130/80   Interventions Monitor blood pressure at each session;Educate on importance of taking prescribed blood pressure medication correctly;Educate on importance of regular exercise program;Educate on importance of maintaining appropriate diet and weight Monitor blood pressure at each session;Educate on importance of taking prescribed blood pressure medication correctly;Educate on importance of regular exercise program;Educate on importance of maintaining appropriate diet and weight   Progress towards goal In progress;Verbalizes understanding In progress;Verbalizes understanding   Goal progress comment initial goals set and interventions initiated AEB BP at goal at today's visit     Most recent BP: 128/75    DIABETES:      10/03/2021    11:00 AM 10/28/2021    10:42 AM   DIABETES   Patient goal to lower HgbA1C to lower HgbA1C   Other Goals Patient will maintain an acceptable blood sugar range Patient will maintain an acceptable blood sugar range   Interventions Educate patient on compliance with home blood sugar monitoring as prescribed by physician;Educate patient on signs and symptoms of hypo- and hyperglycemia;Instruct patient to monitor blood sugar before and after exercise Educate patient on compliance with home blood sugar monitoring as prescribed by physician;Educate patient on signs and symptoms of hypo- and hyperglycemia;Instruct patient to monitor blood sugar before and after exercise   HbA1c 8.3 8.3   Blood sugar 144 161   Check BS at home? Yes Yes   Progress towards goal In progress;Verbalizes understanding;Notify MD In progress;Verbalizes understanding;Notify MD   Progress towards goal - Comments message to Dr. Hollice Espy regarding diabetes education referral    Goal progress comment initial goals set and interventions initiated AEB patient will meet with RD for 1:1 consult       HEART FAILURE:      10/03/2021    11:00 AM 10/28/2021    10:42 AM   HEART FAILURE   Patient goal at goal on entry at goal on entry   EF % 55 55   Progress towards goal Yes Yes   Goal progress comment no dx of HF no dx of HF       MEDICATION COMPREHENSION:      10/03/2021    11:00 AM 10/28/2021    10:42 AM   MEDICATION COMPREHENSION   Patient goal to decrease need for nitroglycerin to decrease need for nitroglycerin   Other Goals Patient will demonstrate knowledge of importance of taking meds as evidenced by verbalizing understanding of class, dosage, frequency and side effects. Patient will demonstrate knowledge of importance of taking meds as evidenced by verbalizing understanding of class, dosage, frequency and side effects.   Interventions Educate the patient on prescription and importance of time of day taken;Educate the patient on the class of medication, action and side effects of medication;Review dosage and frequency of medication with the patient Educate the patient on prescription and importance of time of day taken;Educate the patient on the class of medication, action and side effects of medication;Review dosage and frequency of medication with the patient   Medications Understood Yes Yes   Meds taken % of time 100 100   Progress towards goal In progress;Verbalizes understanding In progress;Verbalizes understanding   Goal progress comment AEB initial goals set and interventions initiated AEB patient starting cardiac rehab       TOBACCO CESSATION:  10/03/2021    11:00 AM 10/28/2021    10:42 AM   TOBACCO CESSATION   Patient goal at goal on entry at goal on entry   Progress towards goal Yes Yes   Goal progress comment AEB does not use tobacco products AEB does not use tobacco products       Social History     Tobacco Use   Smoking Status Former   ??? Packs/day: 1.00   ??? Years: 15.00   ??? Pack years: 15.00   ??? Types: Cigarettes   ??? Quit date: 66   ??? Years since quitting: 49.5   Smokeless Tobacco Never       OTHER CORE COMPONENTS COMMENTS:      10/03/2021    11:00 AM 10/28/2021    10:42 AM   OTHER COMMENTS   Other comments CTO left main CTO left main   Goal progress comment initial goals set and interventions intiated AEB initiated cardiac rehab       OTHER CORE COMPONENTS EDUCATION CLASSES:      09/24/2021     7:00 AM 10/10/2021     7:00 AM   OTHER CORE COMPONENT EDUCATION   Core Component Ed Other Diabetes;Heart Anatomy;Medications;MI and [place];Risk Factors   Attend Date 09/24/2021 10/03/2021   Other Comments orientation to CR 1:1           Signed:  Pilar Plate, RN  10/28/2021 5:01 PM

## 2021-10-29 NOTE — Unmapped (Signed)
I have reviewed and approved patient's 30 day ITP for continuation in the cardiac rehab program with noted changes to their exercise prescription and therapy goals.     [name], MD, PhD  [facility]

## 2021-10-30 NOTE — Unmapped (Signed)
Patients exercise Rx and individual treatment plan, including nutrition and psychosocial components, were reviewed at Multidisciplinary Cardiac/Pulmonary Rehab Staff meeting.    Recommendations?  Yes, patient will probably have chest pain.  Keep on CAP.

## 2021-10-30 NOTE — Unmapped (Signed)
Abstraction Result Flowsheet Data    This patient's last AWV date: [facility] Last Medicare Wellness Visit Date: 12/09/2017  This patients last WCC/CPE date: : Not Found      Reason for Encounter  Reason for Encounter: Outreach  Primary Reason for Outreach: AWV  Text Message: No  MyChart Message: Yes  Outreach Call Outcome: Left message

## 2021-10-31 NOTE — Unmapped (Signed)
Rehabilitation Nutrition Assessment      Jesus Rubio is a 76 y.o. 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 His active problem list, medication list, allergies, family history, social history, notes from last encounter, and lab results were reviewed.     Anthropometrics  Height:         10/22/21 182.9 cm (6' 0.01)     Weight:          10/31/21 (!) 111.4 kg (245 lb 8 oz)       BMI Readings from Last 3 Encounters:   10/31/21 33.29 kg/m??   10/28/21 33.22 kg/m??   10/22/21 33.36 kg/m??       Wt Readings from Last 6 Encounters:   10/31/21 (!) 111.4 kg (245 lb 8 oz)   10/28/21 (!) 111.1 kg (245 lb)   10/22/21 (!) 111.6 kg (246 lb)   10/03/21 (!) 111.1 kg (245 lb)   09/04/21 (!) 111.3 kg (245 lb 6.4 oz)   08/08/21 (!) 110.7 kg (244 lb)       Usual Body Weight: 245 lb per patient report; desire to lose 20#   Ideal Body Weight: Ideal body weight: 77.6 kg (171 lb 1.9 oz)  Adjusted ideal body weight: 91.1 kg (200 lb 13.9 oz)  Weight Status Categories: 30-34.9 kg/m2 Obesity (Class 1  Waist Circumference: He has BMI >/= 25. Current waist circumference: 47 inches .  Waist circumference is not at goal of 40 inches or less (Male)       Biochemical Data:   Lab Results   Component Value Date    CHOL 129 07/05/2021    HDL 50 07/05/2021    LDL 48 (L) 07/05/2021    NONHDL 79 07/05/2021    TRIG 315 (H) 07/05/2021     BP Readings from Last 1 Encounters:   10/31/21 150/70     Hemoglobin A1C (%)   Date Value   07/04/2021 8.3 (A)   04/05/2021 7.7 (A)   12/24/2020 6.2 (H)   08/17/2020 5.8        Medications and Vitamin/Mineral Supplementation:  All nutritionally pertinent medications reviewed on 10/31/2021.   Nutrition Supplements: Not taking   Current Outpatient Medications   Medication Sig Dispense Refill    acetaminophen (TYLENOL) 325 MG tablet Take 2 tablets (650 mg total) by mouth every six (6) hours as needed for pain.  0    albuterol HFA 90 mcg/actuation inhaler Inhale 2 puffs every six (6) hours as needed for wheezing or shortness of breath. (Patient not taking: Reported on 05/17/2021) 18 g 0    apixaban (ELIQUIS) 5 mg Tab Take 1 tablet (5 mg total) by mouth Two (2) times a day. 180 tablet 3    blood-glucose meter kit Check BS every day 1 kit 0    clopidogreL (PLAVIX) 75 mg tablet Take 1 tablet (75 mg total) by mouth daily. 30 tablet 11    cyanocobalamin, vitamin B-12, 1,000 mcg/mL injection Inject 1 mL (1,000 mcg total) under the skin every thirty (30) days. 10 mL 1    diclofenac sodium (VOLTAREN) 1 % gel Apply 2 g topically four (4) times a day. 100 g 0    dilTIAZem (CARDIZEM) 30 MG tablet 1 tab Q3 hr PRN bad palpitations, HR>110.  Be sure top number blood pressure over 95. 30 tablet 0    empty container (SHARPS-A-GATOR DISPOSAL SYSTEM) Misc Use as directed for sharps  disposal 1 each 2    ergocalciferol-1,250 mcg, 50,000 unit, (DRISDOL) 1,250 mcg (50,000 unit) capsule Take 1 capsule (1,250 mcg total) by mouth once a week. 12 capsule 1    evolocumab (REPATHA SURECLICK) 140 mg/mL PnIj Inject the contents of one pen (140 mg) under the skin every fourteen (14) days. 2 mL 11    fluticasone propionate (FLONASE) 50 mcg/actuation nasal spray 2 sprays into each nostril in the morning. 16 g 5    gabapentin (NEURONTIN) 100 MG capsule Take 1 capsule (100 mg total) by mouth Three (3) times a day. 90 capsule 1    glipiZIDE (GLUCOTROL) 5 MG tablet TAKE 1 TABLET (5 MG TOTAL) BY MOUTH IN THE MORNING. FOR DIABETES.. 30 tablet 2    isosorbide mononitrate (IMDUR) 30 MG 24 hr tablet Take 1 tablet (30 mg total) by mouth daily. 90 tablet 1    metoprolol succinate (TOPROL-XL) 100 MG 24 hr tablet TAKE 2 TABLETS BY MOUTH EVERY DAY 180 tablet 1    miscellaneous medical supply Misc Glucometer  Dx: E11.9 1 each 0    miscellaneous medical supply Misc Glucometer test strips   Dx: E11.9 50 each 5    miscellaneous medical supply Misc Lancets  Dx  E11.9 100 each 1    nitroglycerin (NITROLINGUAL) 0.4 mg/dose spray Place 1 spray under the tongue every five (5) minutes as needed for chest pain.      nitroglycerin (NITROSTAT) 0.4 MG SL tablet Place 1 tablet (0.4 mg total) under the tongue every five (5) minutes as needed for chest pain. Maximum of 3 doses in 15 minutes. 100 tablet 3    omeprazole (PRILOSEC) 10 MG capsule TAKE 1 CAPSULE BY MOUTH EVERY DAY PLUS 20 MG TO EQUAL 30 MG FOR TAPERING OFF. (Patient not taking: Reported on 10/22/2021) 90 capsule 1    omeprazole (PRILOSEC) 20 MG capsule Take 1 capsule (20 mg total) by mouth Two (2) times a day. (Patient not taking: Reported on 10/22/2021) 180 capsule 1    ONETOUCH VERIO TEST STRIPS Strp USE TO CHECK BLOOD SUGAR 1-2 TIMES PER DAY 100 strip 1    traZODone (DESYREL) 50 MG tablet TAKE 2 TABLETS BY MOUTH NIGHTLY 180 tablet 1     No current facility-administered medications for this visit.       Nutrition History     Dietary Restrictions: No known food allergies or food intolerances.   Hunger and Satiety:  Pt stated doesn't eat very much, eats one meal per day and snacks   Food Safety and Access: No to little issues noted.     Meal Schedule: 2 meals per day with snacks    Breakfast: cereal (11-noon) - Rasin Bran with whole milk  Lunch: skips    Dinner: pork roast, potatoes, gravy, green beans  Snacks: nabs, cheese doodles, Twinkies  Beverages: 2 -16oz bottles flavored water, 2 - 16oz diet caffeine free pepsi    Food preferences: no broccoli, rice, salmon, cheese; love chicken tenders from [employer] A with BBQ sauce.   Cooking Methods: stove, oven     Eating Habits:  Picture Your Plate: He presents with score: Less than 42: indicates much room for improvement as current diet is not heart healthy     Behavioral Eating Patterns:  Overeating: No.  Emotional Eating: No.   Grazing: Yes.   Fast Eating: No.  Nighttime Eating: Yes.     Physical Activity  Type: n/a     Estimated Daily Nutritional Needs  Energy:  25-30 kcal/kg  Protein: 1-1.2 g/kg  Fat: 20-35 % of total calories   Carbohydrate: 45-60 % of total calories   Fluid: 67mL/kcal or per MD team. No restriction noted at this time.   Fiber: 25-30 g  Sodium:  <2300 mg  Cholesterol: <200 mg  Added Sugars: < 24 g (AHA recommendations)      Nutrition Goals & Evaluation     Goal #1: Achieve a healthy body weight (New)    Goal #2: Attain optimal blood chemistries: non-fasting LDL, fasting lipid profile, fasting glucose and A1C (New)    Goal #3: Establish healthy eating patterns to meet nutrient needs (New)      Nutrition Assessment       Met with Mr. Antczak and his wife [name]) to discuss diet and nutrition. Diet history reviewed (above). Pt stated he would like to eat foods that will help him lose weight (~20#) because he feels he has gained most of the weight via medications. Mentioned that healthy eating is better than eating junk food, but asked if it will make him feel better. Reviewed PYP (score of 34) and educated that small changes towards the left side of the page will help increase his score. Pt chose to focus on fruits/vegetables, grains/breads/cereals, and beans/nuts/seeds. Offered recipe of peanut butter and banana sandwich with cinnamon on WG bread - agreed to try it. Asked how aggressive he would like to be with his goals, endorsed he would like to be aggressive, but within reason so he can still enjoy eating. Suggested to start with the categories mentioned above and RD will follow up weekly to see where things are going, then schedule another visit - both agreeable. Handouts provided after visit, see below. Educated that RD is available for questions Monday through Thursday at rehab and via MyChart.         Nutrition Interventions     Provided interpretation and feedback on:   Eating patterns    Nutrition education and counseling provided on healthy/balanced diet plan. He was provided recommendation on:   Increasing fresh vegetables and fruits   Choosing whole wheat breads, grains and cereals   Decreasing red and processed meat   Increasing lean poultry, remove skin and bake / air fry instead of frying   Increasing fish and shellfish   Choosing low fat or no fat dairy products   Reducing sweets   Reducing fast food     Handouts provided: How to Build a Healthy Eating Pattern (DGA), Vegetables, Fruit, Whole Grains, and Beans ([facility]), Sodium Free Flavoring Tips (AND), Setting Goals for Weight Management (NCM), DASH Eating Plan [facility])    Completed nutrition assessment flowsheet: Yes.    Completed nutrition education to flowsheet: Yes.      Time spent: 50 minutes    Monitoring and Follow-up: Will attend interdisciplinary monthly meetings for follow-up evaluation of patients??? progress. Program staff members will follow-up on weight status and available labs and notify RD of any concerns. Will address any nutrition-related needs throughout the patient???s involvement in the course of the program.    ITPGOALPROGRESS: The patient is progressing toward program goal as  evidence by   willingness to sit down with RD to discuss diet and nutrition changes.        Patient would benefit from referral to additional nutrition services. Recommended referral order: No.

## 2021-10-31 NOTE — Unmapped (Signed)
Appropriate response to exercise session: yes   Exercise session goals :  Duration goal  of 30 minutes continuous exercise achieved : no; meeting with RD  Achieved THHR : no  Recommendation to change exercise prescription: no       Post exercise orthopedic pain unchanged : yes

## 2021-10-31 NOTE — Unmapped (Signed)
Does the patient report any symptoms since last visit ?  no  Does the patient report any medication changes since last visit ? no   Does the patient report any pain? yes  Intervention: exercise modified to limit exacerbation of symptoms  Pain managed of orthopedic pain through patients PCP.        Patient tolerated 15 minutes of group functional conditioning: balance training and/or strength training exercises with no complaints;  15 minutes of 1:1 individualized education in regard to disease management- patient verbalizes and demonstrates understanding.        Patient attended group or individual education  yes    Topic: benefits of cardiac rehab  Family present:no  Learning tools   Written material : yes  Audio / Visual :yes  Hands on / models no  Discussion / lecture: yes    Patient verbalizes understanding and teaching goal met  yes

## 2021-10-31 NOTE — Unmapped (Signed)
[facility] Refill Coordination Note    Specialty Medication(s) to be Shipped:   General Specialty: Repatha    Other medication(s) to be shipped: No additional medications requested for fill at this time     Jesus Rubio, DOB: June 22, 1945  Phone: [phone] (home)       All above HIPAA information was verified with patient's family member, wife.     Was a Nurse, learning disability used for this call? No    Completed refill call assessment today to schedule patient's medication shipment from the [facility] [phone]).  All relevant notes have been reviewed.     Specialty medication(s) and dose(s) confirmed: Regimen is correct and unchanged.   Changes to medications: Ferguson reports no changes at this time.  Changes to insurance: No  New side effects reported not previously addressed with a pharmacist or physician: None reported  Questions for the pharmacist: No    Confirmed patient received a Conservation officer, historic buildings and a Surveyor, mining with first shipment. The patient will receive a drug information handout for each medication shipped and additional FDA Medication Guides as required.       DISEASE/MEDICATION-SPECIFIC INFORMATION        For patients on injectable medications: Patient currently has 0 doses left.  Next injection is scheduled for 11/08/2021.    SPECIALTY MEDICATION ADHERENCE     Medication Adherence    Patient reported X missed doses in the last month: 0  Specialty Medication: Repatha 140mg /ml  Patient is on additional specialty medications: No  Patient is on more than two specialty medications: No  Any gaps in refill history greater than 2 weeks in the last 3 months: no  Demonstrates understanding of importance of adherence: yes  Informant: spouse  Reliability of informant: reliable  Confirmed plan for next specialty medication refill: delivery by pharmacy  Refills needed for supportive medications: not needed              Were doses missed due to medication being on hold? No    Repatha 140 mg/ml: 0 days of medicine on hand       REFERRAL TO PHARMACIST     Referral to the pharmacist: Not needed      [facility]     Shipping address confirmed in Epic.     Delivery Scheduled: Yes, Expected medication delivery date: 11/01/2021.     Medication will be delivered via Same Day Courier to the prescription address in Epic WAM.    Valere Dross   [facility] Specialty Technician

## 2021-11-01 MED FILL — REPATHA SURECLICK 140 MG/ML SUBCUTANEOUS PEN INJECTOR: SUBCUTANEOUS | 28 days supply | Qty: 2 | Fill #4

## 2021-11-04 NOTE — Unmapped (Signed)
Does the patient report any symptoms since last visit ?  no  Does the patient report any medication changes since last visit ? no   Does the patient report any pain? Yes; BLE pain  Intervention: exercise modified to limit exacerbation of symptoms  Pain managed of orthopedic pain through patients PCP.        Patient tolerated 15 minutes of group functional conditioning: balance training and/or strength training exercises with no complaints;  15 minutes of 1:1 individualized education in regard to disease management- patient verbalizes and demonstrates understanding.

## 2021-11-04 NOTE — Unmapped (Signed)
Appropriate response to exercise session: yes   Exercise session goals :  Duration goal  of 30 minutes continuous exercise achieved : yes  Achieved THHR : no  Recommendation to change exercise prescription: no       Post exercise orthopedic pain unchanged : yes

## 2021-11-06 MED ORDER — METOPROLOL SUCCINATE ER 100 MG TABLET,EXTENDED RELEASE 24 HR
ORAL_TABLET | 1 refills | 0 days | Status: CP
Start: 2021-11-06 — End: ?

## 2021-11-06 NOTE — Unmapped (Signed)
Does the patient report any symptoms since last visit ?????no  Does the patient report any medication changes since last visit ???no??  Does the patient report any pain???no new - cont chronic pain????Intervention: seated mode????Pain managed of orthopedic pain through patients PCP. ??  ??  ??  Patient tolerated 15 minutes of group functional conditioning: balance training and/or strength training exercises with no complaints;  15 minutes of 1:1 individualized education in regard to disease management- patient verbalizes and demonstrates understanding.  ??  ??  Appropriate response to exercise session:??yes??  Exercise session goals :  Duration goal ??of 30 minutes continuous exercise achieved : yes  Achieved [facility] :??yes  Recommendation to change exercise prescription:??no??  ??  ??  Post exercise orthopedic pain unchanged :??yes

## 2021-11-07 NOTE — Unmapped (Signed)
Does the patient report any symptoms since last visit ?  no  Does the patient report any medication changes since last visit ? no   Does the patient report any pain? no new pain - cont with chronic pain  Intervention: seated ex   Pain managed of orthopedic pain through patients PCP.        Patient tolerated 15 minutes of group functional conditioning: balance training and/or strength training exercises with no complaints;  15 minutes of 1:1 individualized education in regard to disease management- patient verbalizes and demonstrates understanding.        Patient attended group or individual education  yes    Topic: Risk Factors  Family present:no  Learning tools   Written material : no  Audio / Visual :yes  Hands on / models yes  Discussion / lecture: yes    Patient verbalizes understanding and teaching goal met  Yes      Appropriate response to exercise session: yes   Exercise session goals :  Duration goal  of 30 minutes continuous exercise achieved : yes  Achieved THHR : yes  Recommendation to change exercise prescription: yes - cont to progress      Post exercise orthopedic pain unchanged : yes

## 2021-11-11 NOTE — Unmapped (Signed)
Appropriate response to exercise session: yes   Exercise session goals :  Duration goal  of 30 minutes continuous exercise achieved : yes  Achieved THHR : yes  Recommendation to change exercise prescription: no       Post exercise orthopedic pain unchanged : yes

## 2021-11-11 NOTE — Unmapped (Signed)
Does the patient report any symptoms since last visit ?  Yes, chest pain yesterday relieved with 1 nitroglycerin, no pain today  Does the patient report any medication changes since last visit ? no   Does the patient report any pain? no  Intervention: none needed  Pain managed of orthopedic pain through patients PCP.        Patient tolerated 15 minutes of group functional conditioning: balance training and/or strength training exercises with no complaints;  15 minutes of 1:1 individualized education in regard to disease management- patient verbalizes and demonstrates understanding.

## 2021-11-13 ENCOUNTER — Ambulatory Visit: Admit: 2021-11-13 | Discharge: 2021-11-14 | Payer: MEDICARE | Attending: Family Medicine | Primary: Family Medicine

## 2021-11-13 DIAGNOSIS — R000 Tachycardia, unspecified: Principal | ICD-10-CM

## 2021-11-13 DIAGNOSIS — D649 Anemia, unspecified: Principal | ICD-10-CM

## 2021-11-13 DIAGNOSIS — E559 Vitamin D deficiency, unspecified: Principal | ICD-10-CM

## 2021-11-13 DIAGNOSIS — E1142 Type 2 diabetes mellitus with diabetic polyneuropathy: Principal | ICD-10-CM

## 2021-11-13 DIAGNOSIS — G629 Polyneuropathy, unspecified: Principal | ICD-10-CM

## 2021-11-13 DIAGNOSIS — I2510 Atherosclerotic heart disease of native coronary artery without angina pectoris: Principal | ICD-10-CM

## 2021-11-13 LAB — CBC W/ AUTO DIFF
BASOPHILS ABSOLUTE COUNT: 0.1 10*9/L (ref 0.0–0.1)
BASOPHILS RELATIVE PERCENT: 1.1 %
EOSINOPHILS ABSOLUTE COUNT: 0.3 10*9/L (ref 0.0–0.5)
EOSINOPHILS RELATIVE PERCENT: 2.8 %
HEMATOCRIT: 33.1 % — ABNORMAL LOW (ref 39.0–48.0)
HEMOGLOBIN: 10.8 g/dL — ABNORMAL LOW (ref 12.9–16.5)
LYMPHOCYTES ABSOLUTE COUNT: 1.5 10*9/L (ref 1.1–3.6)
LYMPHOCYTES RELATIVE PERCENT: 15.2 %
MEAN CORPUSCULAR HEMOGLOBIN CONC: 32.5 g/dL (ref 32.0–36.0)
MEAN CORPUSCULAR HEMOGLOBIN: 25.5 pg — ABNORMAL LOW (ref 25.9–32.4)
MEAN CORPUSCULAR VOLUME: 78.4 fL (ref 77.6–95.7)
MEAN PLATELET VOLUME: 8.7 fL (ref 6.8–10.7)
MONOCYTES ABSOLUTE COUNT: 0.8 10*9/L (ref 0.3–0.8)
MONOCYTES RELATIVE PERCENT: 8.2 %
NEUTROPHILS ABSOLUTE COUNT: 7.1 10*9/L (ref 1.8–7.8)
NEUTROPHILS RELATIVE PERCENT: 72.7 %
NUCLEATED RED BLOOD CELLS: 0 /100{WBCs} (ref <=4)
PLATELET COUNT: 343 10*9/L (ref 150–450)
RED BLOOD CELL COUNT: 4.22 10*12/L — ABNORMAL LOW (ref 4.26–5.60)
RED CELL DISTRIBUTION WIDTH: 16.7 % — ABNORMAL HIGH (ref 12.2–15.2)
WBC ADJUSTED: 9.8 10*9/L (ref 3.6–11.2)

## 2021-11-13 LAB — BASIC METABOLIC PANEL
ANION GAP: 7 mmol/L (ref 7–15)
BLOOD UREA NITROGEN: 15 mg/dL (ref 7–21)
BUN / CREAT RATIO: 11
CALCIUM: 9.8 mg/dL (ref 8.5–10.2)
CHLORIDE: 102 mmol/L (ref 98–107)
CO2: 26 mmol/L (ref 22.0–32.0)
CREATININE: 1.4 mg/dL — ABNORMAL HIGH (ref 0.70–1.30)
EGFR CKD-EPI (2021) MALE: 52 mL/min/{1.73_m2} — ABNORMAL LOW (ref >=60)
GLUCOSE RANDOM: 139 mg/dL — ABNORMAL HIGH (ref 74–106)
POTASSIUM: 4.9 mmol/L (ref 3.5–5.0)
SODIUM: 135 mmol/L (ref 135–145)

## 2021-11-13 LAB — HEMOGLOBIN A1C
ESTIMATED AVERAGE GLUCOSE: 148 mg/dL
HEMOGLOBIN A1C: 6.8 % — ABNORMAL HIGH (ref 4.8–5.6)

## 2021-11-13 LAB — IRON & TIBC
IRON SATURATION: 7 %
IRON: 36 ug/dL — ABNORMAL LOW (ref 37–170)
TOTAL IRON BINDING CAPACITY: 481 ug/dL — ABNORMAL HIGH (ref 252–479)

## 2021-11-13 LAB — HEPATIC FUNCTION PANEL
ALBUMIN: 4.2 g/dL (ref 3.4–5.0)
ALKALINE PHOSPHATASE: 58 U/L (ref 38–126)
ALT (SGPT): 22 U/L (ref <50)
AST (SGOT): 28 U/L (ref 19–55)
BILIRUBIN DIRECT: 0.2 mg/dL (ref 0.00–0.40)
BILIRUBIN TOTAL: 0.4 mg/dL (ref 0.1–1.2)
PROTEIN TOTAL: 7.1 g/dL (ref 6.5–8.3)

## 2021-11-13 LAB — VITAMIN B12: VITAMIN B-12: 568 pg/mL (ref 211–911)

## 2021-11-13 LAB — FOLATE: FOLATE: 10.7 ng/mL (ref >=5.4)

## 2021-11-13 MED ORDER — ERGOCALCIFEROL (VITAMIN D2) 1,250 MCG (50,000 UNIT) CAPSULE
ORAL_CAPSULE | ORAL | 1 refills | 84.00000 days | Status: CP
Start: 2021-11-13 — End: 2021-11-13

## 2021-11-13 NOTE — Unmapped (Signed)
Appropriate response to exercise session: yes   Exercise session goals :  Duration goal  of 30 minutes continuous exercise achieved : yes  Achieved THHR : no  Recommendation to change exercise prescription: no       Post exercise orthopedic pain unchanged : yes

## 2021-11-13 NOTE — Unmapped (Signed)
Patient and his wife in for diabetes education follow up visit.  I did not see his most updated A1C that was just done today until after the patient had left but it shows improvement from 8.3% to now 6.8%.  The patient is currently taking Glipizide 5mg  daily before his breakfast meal.  He brought his blood sugar log for the past 4 weeks and I have scanned these results in the epic media files.  His FBS has ranged from 129-174 and 2 hours post supper levels 120-319 (most are under 200).   The patient reports that him and his wife have been trying to make positive changes to their nutrition and meal planning to improve diabetes management.  His wife has diabetes also so they are doing this together.  He still struggles with overeating at supper with certain foods that he really likes and he was able to see how this affected his blood sugar levels.  We reviewed more in depth meal planning education and used the food models to help with appropriate portion sizes.    The patient was concerned with some of his higher blood sugar levels post supper. We discussed the possibility of adding a second Glipizide 5mg  before his supper meal and I told him I would mention this to his PCP.  His wife also asked about Metformin.  She reported that it has really helped her and wondered if her husband should try it again.  He states that he has taken Metformin in the past but it was discontinued due to stomach upset.  He stated he would be willing to try Metformin again and I encouraged him to discuss this with his PCP at his next visit.    I will send his PCP an update note regarding today's visit.  The patient and his wife requested to follow up again in 3 months.    Jerrod Damiano RN,CDCES

## 2021-11-13 NOTE — Unmapped (Signed)
Abstraction Result Flowsheet Data    This patient's last AWV date: [place] Last Medicare Wellness Visit Date: 12/09/2017  This patients last WCC/CPE date: : Not Found      Reason for Encounter  Reason for Encounter: Outreach  Primary Reason for Outreach: AWV  Text Message: No  MyChart Message: Yes  Outreach Call Outcome: Left message

## 2021-11-13 NOTE — Unmapped (Signed)
Health Maintenance Due   Topic Date Due    COVID-19 Vaccine (1) Never done    Zoster Vaccines (1 of 2) Never done    DTaP/Tdap/Td Vaccines (1 - Tdap) 04/08/2002    Hemoglobin A1c  10/04/2021    Retinal Eye Exam  12/13/2021           Goals & Recommendations:    Goals and Recommendations related to your health are outlined below.    If discussed and provided at your visit today: Please, keep and record your blood sugar, blood pressure, weight, fluid intake, dietary intake and exercise amount and frequency.     Blood Pressure Goals (unless otherwise instructed):  Check 2-3 times a week.  Typical average out of office measurement: <135 mmHg systolic and < 85 mmHg diastolic (less than 135/85).  More strict recommendations for out of office measurement: < 130 mmHg systolic and < 80 mmHg diastolic (less than 130/80).  Typical in office measurement <140 mmHg systolic and <90 mmHg diastolic (less than 140/90).  Most people will not feel well if blood pressure drops to less than 110/70, unless they typically have blood pressures in this range.   If you are noticing sudden increases or decreases in blood pressure, then please contact the office.    Diabetes: Goal Hemoglobin A1c 7.9 or lower.  Goal Blood Sugars Fasting: 60-140 (may check fasting and two hours after a meal later in the day).    For Monitoring your Mood your PHQ-9 Goal is less than 10.    Drink 48 ounces of water per day.  Avoid sweetened beverages.  You will generally feel better and experience improved health when you limit, and if possible avoid, processed foods.  Diets high in plant based foods, vegetables and fruits are associated with many health benefits.    Exercise for a minimum of 20 minutes daily. Minimum of four days a week.   If possible try to obtain 60 minutes of exercise daily for five or more days per week.  Regular movement throughout the day is best.   Walking is a good form of movement.   Squats, lunges, step-ups, push-ups, planks, and arm exercises with or without weights or resistance bands are good forms of exercise.    If your BMI (body mass index) is above 25 then your body is carrying more weight than is typically healthy.  If your BMI (body mass index) is above 25 and you are not achieving the healthy weight goals that you desire, then consider the following:  Keep calories below 2500 calories per day unless otherwise instructed. Remember that all calories are not equal.  Keep a food journal and record accurately.  Read labels and ingredient lists on the products that you consume.  Sometimes we need to adjust your level of physical activity, and or tailor an exercise program more specifically for your needs, just ask and we will come up with a plan.  If you have questions about any of these recommendations, please ask.    If you smoke, stop.  If you have heart failure or severe kidney disease: weigh yourself daily and maintain fluid intake to less than 2 Liters daily unless otherwise instructed.  If you have diabetes you need to see the eye doctor at least every one to two years and have a yearly foot exam.  Set aside time daily for relaxation.  Plan a pleasant activity that you can look forward to and enjoy.  Medication changes are outlined on  your After Visit Summary.  Any referrals placed today will be outlined on your After Visit Summary.    If you have questions, please call the office or use MyChart.   If you need a refill on a medication, please contact your pharmacy.  Please, allow at least two business days for responses to refill requests.    Please, bring all medications and when possible original prescription containers to each and every office visit.    If you have been provided and / or requested to record your blood sugars, blood pressures, weight, fluid intake, dietary intake, or exercise, then please bring records to each and every appointment.    When you see other providers for health care, please request that they send information and records related to your visit to our office. This includes vaccinations, mammograms, radiology, procedures, diagnostic testing, lab testing and physical exams or wellness exams that you have outside of the [facility].      [facility]  [address], [address]  [place], [place] [address]  Office: [phone]  Fax: [phone]             Learning About Mindfulness for Stress  What are mindfulness and stress?    Stress is what you feel when you have to handle more than you are used to. A lot of things can cause stress. You may feel stress when you go on a job interview, take a test, or run a race. This kind of short-term stress is normal and even useful. It can help you if you need to work hard or react quickly.  Stress also can last a long time. Long-term stress is caused by stressful situations or events. Examples of long-term stress include long-term health problems, ongoing problems at work, and conflicts in your family. Long-term stress can harm your health.  Mindfulness is a focus only on things happening in the present moment. It's a process of purposefully paying attention to and being aware of your surroundings, your emotions, your thoughts, and how your body feels. You are aware of these things, but you aren't judging these experiences as good or bad. Mindfulness can help you learn to calm your mind and body to help you cope with illness, pain, and stress.  How does mindfulness help to relieve stress?  Mindfulness can help quiet your mind and relax your body. Studies show that it can help some people sleep better, feel less anxious, and bring their blood pressure down. And it's been shown to help some people live and cope better with certain health problems like heart disease, depression, chronic pain, and cancer.  How do you practice mindfulness?  To be mindful is to pay attention, to be present, and to be accepting.  When you're mindful, you do just one thing and you pay close attention to that one thing. For example, you may sit quietly and notice your emotions or how your food tastes and smells.  When you're present, you focus on the things that are happening right now. You let go of your thoughts about the past and the future. When you dwell on the past or the future, you miss moments that can heal and strengthen you. You may miss moments like hearing a child laugh or seeing a friendly face when you think you're all alone.  When you're accepting, you don't judge the present moment. Instead you accept your thoughts and feelings as they come.  You can practice anytime, anywhere, and in any way you choose. You can practice in many ways. Here are a few ideas:  While doing your chores, like washing the dishes, let your mind focus on what's in your hand. What does the dish feel like? Is the water warm or cold?  Go outside and take a few deep breaths. What is the air like? Is it warm or cold?  When you can, take some time at the start of your day to sit alone and think.  Take a slow walk by yourself. Count your steps while you breathe in and out.  Try yoga breathing exercises, stretches, and poses to strengthen and relax your muscles.  At work, if you can, try to stop for a few moments each hour. Note how your body feels. Let yourself regroup and let your mind settle before you return to what you were doing.  If you struggle with anxiety or worry thoughts, imagine your mind as a blue sky and your worry thoughts as clouds. Now imagine those worry thoughts floating across your mind's sky. Just let them pass by as you watch.  Follow-up care is a key part of your treatment and safety. Be sure to make and go to all appointments, and call your doctor if you are having problems. It's also a good idea to know your test results and keep a list of the medicines you take.  Where can you learn more?  Go to [facility] at [email or web address]  Select Preferences in the upper right hand corner, then select Health Library under Resources. Enter [id number] in the search box to learn more about Learning About Mindfulness for Stress.  Current as of: January 15, 2016  Content Version: 11.6  ?? 2006-2018 [employer]. Care instructions adapted under license by [facility]. If you have questions about a medical condition or this instruction, always ask your healthcare professional. [employer], Incorporated disclaims any warranty or liability for your use of this information.         Learning About Guided Imagery for Stress  What are guided imagery and stress?    Stress is what you feel when you have to handle more than you are used to. A lot of things can cause stress. You may feel stress when you go on a job interview, take a test, or run a race. This kind of short-term stress is normal and even useful. It can help you if you need to work hard or react quickly.  Stress also can last a long time. Long-term stress is caused by stressful situations or events. Examples of long-term stress include long-term health problems, ongoing problems at work, and conflicts in your family. Long-term stress can harm your health.  Guided imagery is a technique of directed thoughts and suggestions that guide your mind toward a relaxed, focused state. This technique helps you use your mind to take you to a calm, peaceful place. You can use it to relax, which can lower blood pressure and reduce other problems related to stress.  How does guided imagery help to relieve stress?  Because of the way the mind and body are connected, guided imagery can make you feel like you are experiencing something just by imagining it. You can achieve a relaxed state when you imagine all the details of a safe, comfortable place, such as a beach or a garden. This relaxed state may help you feel more in control of your emotions and thought processes. Feeling in control may  improve your attitude, health, and sense of well-being.  How do you do guided imagery?  You can use a smartphone app or a video to lead you through the process. You use all of your senses in guided imagery. For example, if you want a tropical setting, you can imagine the warm breeze on your skin, the bright blue of the water, the sound of the surf, the sweet scent of tropical flowers, and the taste of coconut. Imagining those things can make you actually feel like you're there.  But you don't have to imagine the tropics to feel peace. Guided imagery can take you to your own restful place. To give guided imagery a try, follow these steps:  Lean back comfortably in your chair. If you can, close your eyes. Put your arms on the armrests, or fold your hands in your lap.  Take a deep breath through your nose. Breathe in slowly, and then let the air out completely through your mouth.  Do it again slowly. Keep breathing like this. Gather up any tension in your body, and send it out with every breath.  Let a feeling of warmth spread from your lungs to your neck and head, down your arms to your fingertips, through your body and into your legs, all the way down to your toes. Stay this way for a moment.  Now imagine a pleasant day. You're at a park or at a place you've visited in the past where you felt at peace.  In your mind's eye, look at what lies in front of you. Maybe you see the sun, filtered through trees. Maybe clouds are drifting by.  Look to one side, and then the other. Notice the feel of the air around you. Notice how it feels on your face and on your arms.  Stay here for a while. Let it become real for you.  Follow-up care is a key part of your treatment and safety. Be sure to make and go to all appointments, and call your doctor if you are having problems. It's also a good idea to know your test results and keep a list of the medicines you take.  Where can you learn more?  Go to [facility] at [email or web address]  Select Preferences in the upper right hand corner, then select Health Library under Resources. Enter N291 in the search box to learn more about Learning About Guided Imagery for Stress.  Current as of: January 15, 2016  Content Version: 11.6  ?? 2006-2018 [employer], Inc[employer]. Care instructions adapted under license by [facility]. If you have questions about a medical condition or this instruction, always ask your healthcare professional. Healthwise, Incorporated disclaims any warranty or liability for your use of this information.        Learning About the Mediterranean Diet  What is the Mediterranean diet?    The Mediterranean diet is a style of eating rather than a diet plan. It features foods eaten in [place], [place], southern [place], and other countries along the [employer]. It emphasizes eating foods like fish, fruits, vegetables, beans, high-fiber breads and whole grains, nuts, and olive oil. This style of eating includes limited red meat, cheese, and sweets.  Why choose the Mediterranean diet?  A Mediterranean-style diet may improve heart health. It contains more fat than other heart-healthy diets. But the fats are mainly from nuts, unsaturated oils (such as fish oils and olive oil), and certain nut or seed oils (such as canola, soybean, or flaxseed oil).  These fats may help protect the heart and blood vessels.  How can you get started on the Mediterranean diet?  Here are some things you can do to switch to a more Mediterranean way of eating.  What to eat  Eat a variety of fruits and vegetables each day, such as grapes, blueberries, tomatoes, broccoli, peppers, figs, olives, spinach, eggplant, beans, lentils, and chickpeas.  Eat a variety of whole-grain foods each day, such as oats, brown rice, and whole wheat bread, pasta, and couscous.  Eat fish at least 2 times a week. Try tuna, salmon, mackerel, lake trout, herring, or sardines.  Eat moderate amounts of low-fat dairy products, such as milk, cheese, or yogurt.  Eat moderate amounts of poultry and eggs.  Choose healthy (unsaturated) fats, such as nuts, olive oil, and certain nut or seed oils like canola, soybean, and flaxseed.  Limit unhealthy (saturated) fats, such as butter, palm oil, and coconut oil. And limit fats found in animal products, such as meat and dairy products made with whole milk. Try to eat red meat only a few times a month in very small amounts.  Limit sweets and desserts to only a few times a week. This includes sugar-sweetened drinks like soda.  The Mediterranean diet may also include red wine with your meal--1 glass each day for women and up to 2 glasses a day for men.  Tips for eating at home  Use herbs, spices, garlic, lemon zest, and citrus juice instead of salt to add flavor to foods.  Add avocado slices to your sandwich instead of bacon.  Have fish for lunch or dinner instead of red meat. Brush the fish with olive oil, and broil or grill it.  Sprinkle your salad with seeds or nuts instead of cheese.  Cook with olive or canola oil instead of butter or oils that are high in saturated fat.  Switch from 2% milk or whole milk to 1% or fat-free milk.  Dip raw vegetables in a vinaigrette dressing or hummus instead of dips made from mayonnaise or sour cream.  Have a piece of fruit for dessert instead of a piece of cake. Try baked apples, or have some dried fruit.  Tips for eating out  Try broiled, grilled, baked, or poached fish instead of having it fried or breaded.  Ask your server to have your meals prepared with olive oil instead of butter.  Order dishes made with marinara sauce or sauces made from olive oil. Avoid sauces made from cream or mayonnaise.  Choose whole-grain breads, whole wheat pasta and pizza crust, brown rice, beans, and lentils.  Cut back on butter or margarine on bread. Instead, you can dip your bread in a small amount of olive oil.  Ask for a side salad or grilled vegetables instead of french fries or chips.  Where can you learn more?  Go to [facility] at [email or web address]  Select Health Library under the Resources menu. Enter O407 in the search box to learn more about Learning About the Mediterranean Diet.  Current as of: February 11, 2017  Content Version: 12.2  ?? 2006-2019 Healthwise, Inc[employer]. Care instructions adapted under license by [facility]. If you have questions about a medical condition or this instruction, always ask your healthcare professional. Health[employer], Inc[employer] disclaims any warranty or liability for your use of this information.            Stretching: Exercises  Your Care Instructions  Here are some examples of exercises for stretching. Start each  exercise slowly. Ease off the exercise if you start to have pain.  Your doctor or physical therapist will tell you when you can start these exercises and which ones will work best for you.  How to do the exercises  Latissimus stretch    Stand with your back straight and your feet shoulder-width apart. You can do this stretch sitting down if you are not steady on your feet.  Hold your arms above your head, and hold one hand with the other.  Pull upward while leaning straight over toward your right side. Keep your lower body straight. You should feel the stretch along your left side.  Hold 15 to 30 seconds, and then switch sides.  Repeat 2 to 4 times for each side.  Triceps stretch    Stand with your back straight and your feet shoulder-width apart. You can do this stretch sitting down if you are not steady on your feet.  Bring your left elbow straight up while bending your arm.  Grab your left elbow with your right hand, and pull your left elbow toward your head with light pressure. If you are more flexible, you may pull your arm slightly behind your head. You will feel the stretch along the back of your arm.  Hold 15 to 30 seconds, and then switch elbows.  Repeat 2 to 4 times for each arm.  Calf stretch    Place your hands on a wall for balance. You can also do this with your hands on the back of a chair, a countertop, or a tree.  Step back with your left leg. Keep the leg straight, and press your left heel into the floor.  Press your hips forward, bending your right leg slightly. You will feel the stretch in your left calf.  Hold the stretch 15 to 30 seconds.  Repeat 2 to 4 times for each leg.  Quadriceps stretch    Lie on your side with one hand supporting your head.  Bend your upper leg back and grab your ankle with your other hand.  Stretch your leg back by pulling your foot toward your buttocks. You will feel the stretch in the front of your thigh. If this causes stress on your knees, do not do this stretch.  Hold the stretch 15 to 30 seconds.  Repeat 2 to 4 times for each leg.  Groin stretch    Sit on the floor and put the soles of your feet together. Do not slump your back.  Grab your ankles and gently pull your legs toward you.  Press your knees toward the floor. You will feel the stretch in your inner thighs.  Hold 15 to 30 seconds.  Repeat 2 to 4 times.  Hamstring stretch in doorway    Lie on the floor near a doorway, with your buttocks close to the wall.  Let the leg you are not stretching extend through the doorway.  Put the leg you want to stretch up on the wall, and straighten your knee to feel a gentle stretch at the back of your leg.  Hold the stretch for at least 15 to 30 seconds. Repeat 2 to 4 times.  Follow-up care is a key part of your treatment and safety. Be sure to make and go to all appointments, and call your doctor if you are having problems. It's also a good idea to know your test results and keep a list of the medicines you take.  Where can you learn more?  Go to [facility] at [email or web address]  Select Preferences in the upper right hand corner, then select Health Library under Resources. Enter P733 in the search box to learn more about Stretching: Exercises.  Current as of: March 13, 2016  Content Version: 11.6  ?? 2006-2018 Healthwise, [employer]. Care instructions adapted under license by [facility]. If you have questions about a medical condition or this instruction, always ask your healthcare professional. Healthwise, Incorporated disclaims any warranty or liability for your use of this information.       Patient Education        Preventing Falls: Care Instructions    Talk to your doctor about the medicines you take. Ask if any of them increase the risk of falls and whether they can be changed or stopped.   Try to exercise regularly. It can help improve your strength and balance. This can help lower your risk of falling.     Practice fall safety and prevention.    Wear low-heeled shoes that fit well and give your feet good support. Talk to your doctor if you have foot problems that make this hard.  Carry a cellphone or wear a medical alert device that you can use to call for help.  Use stepladders instead of chairs to reach high objects. Don't climb if you're at risk for falls. Ask for help, if needed.  Wear the correct eyeglasses, if you need them.    Make your home safer.    Remove rugs, cords, clutter, and furniture from walkways.  Keep your house well lit. Use night-lights in hallways and bathrooms.  Install and use sturdy handrails on stairways.  Wear nonskid footwear, even inside. Don't walk barefoot or in socks without shoes.    Be safe outside.    Use handrails, curb cuts, and ramps whenever possible.  Keep your hands free by using a shoulder bag or backpack.  Try to walk in well-lit areas. Watch out for uneven ground, changes in pavement, and debris.  Be careful in the winter. Walk on the grass or gravel when sidewalks are slippery. Use de-icer on steps and walkways. Add non-slip devices to shoes.    Put grab bars and nonskid mats in your shower or tub and near the toilet. Try to use a shower chair or bath bench when bathing.   Get into a tub or shower by putting in your weaker leg first. Get out with your strong side first. Have a phone or medical alert device in the bathroom with you.   Where can you learn more?  Go to [facility] at [email or web address] forces logistics/support/administrative officer in the Menu. Enter G117 in the search box to learn more about Preventing Falls: Care Instructions.  Current as of: February 13, 2021               Content Version: 13.7  ?? 2006-2023 Healthwise, Inc[employer].   Care instructions adapted under license by [facility]. If you have questions about a medical condition or this instruction, always ask your healthcare professional. [employer], [employer] disclaims any warranty or liability for your use of this information.

## 2021-11-13 NOTE — Unmapped (Signed)
Does the patient report any symptoms since last visit ?  no  Does the patient report any medication changes since last visit ? no   Does the patient report any pain? no  Intervention: none needed  Pain managed of orthopedic pain through patients PCP.        Patient tolerated 15 minutes of group functional conditioning: balance training and/or strength training exercises with no complaints;  15 minutes of 1:1 individualized education in regard to disease management- patient verbalizes and demonstrates understanding.

## 2021-11-14 LAB — FERRITIN: FERRITIN: 9.4 ng/mL — ABNORMAL LOW

## 2021-11-14 NOTE — Unmapped (Signed)
Does the patient report any symptoms since last visit ?  no  Does the patient report any medication changes since last visit ? no   Does the patient report any pain? yes  Intervention: exercise modified  Pain managed of orthopedic pain through patients PCP.        Patient tolerated 15 minutes of group functional conditioning: balance training and/or strength training exercises with no complaints;  15 minutes of 1:1 individualized education in regard to disease management- patient verbalizes and demonstrates understanding.          Appropriate response to exercise session: yes   Exercise session goals :  Duration goal  of 30 minutes continuous exercise achieved : yes  Achieved THHR : yes  Recommendation to change exercise prescription: no       Post exercise orthopedic pain unchanged : yes

## 2021-11-17 NOTE — Unmapped (Signed)
Assessment/Plan     Diagnoses and all orders for this visit:    Peripheral polyneuropathy  Continue current plan of care.  -     Vitamin B12 Level; Future  -     Folate Level; Future    Type 2 diabetes mellitus with diabetic polyneuropathy, without long-term current use of insulin (CMS-HCC)  Stable at present.  Continue current plan of care.  -     CBC w/ Differential; Future  -     Basic Metabolic Panel; Future  -     Hepatic Function Panel; Future  -     Hemoglobin A1c; Future    Coronary artery disease involving native coronary artery of native heart, unspecified whether angina present  Clinically asymptomatic.   Standard education and anticipatory guidance regarding goals, objectives, and sequelae.  Continue current management and plan of care.  Continue aggressive risk factor reduction.    Tachycardia  Noted previously.  Currently resolved.  Standard education guidance.  Continue to monitor.    Vitamin D deficiency  -     Vitamin D 25 Hydroxy (25OH D2 + D3); Future  -     ergocalciferol-1,250 mcg, 50,000 unit, (DRISDOL) 1,250 mcg (50,000 unit) capsule; Take 1 capsule (1,250 mcg total) by mouth once a week.    Anemia, unspecified type  Clinically asymptomatic. No evidence of ongoing blood loss.  -     Iron & TIBC; Future  -     Ferritin; Future      Disposition: Return in about 2 months (around 01/13/2022) for Recheck Peripheral Neuropathy, DM, CAD.  01/07/2022      Diagnoses and plan along with any newly prescribed medication(s), recommendations, orders and therapies were discussed in detail with patient today.   Standard education and anticipatory guidance regarding goals, objectives and potential sequelae were reviewed. Patient voiced appropriate understanding of assessment and plan of care.    Pertinent handouts were given today and reviewed with the patient as indicated.  The Care Plan and Self-Management goals have been included on the AVS and the AVS has been printed.  Where helpful for monitoring and management I have encouraged the patient to keep regular logs for me to review at their next visit. Any outside resources or referrals needed at this time are noted above. No additional referrals necessary at this time. Patient voiced understanding and all questions have been answered to satisfaction.     To complete this documentation in a timely manner, portions of this encounter may have been completed utilizing Dragon voice recognition software. Please take this into consideration in noting nuances of documentation, grammar, punctuation, and wording. If and when a question exists, or for clarification, please contact the author.    Jesus Alvine, MD  [facility] at [facility]  [address], [address]  [place], [name] [address]  Phone [phone]    Subjective:     Jesus [name], MD    Jesus Rubio, is a 76 y.o. male    Chief Complaint   Patient presents with    Peripheral polyneuropathy     Patient presents today for a 3 week follow up. Reports doing well. No changes noted. No new concerns.        HPI:    Patient presents: With wife.    Nursing notes and history reviewed with patient and wife.    Peripheral polyneuropathy.  Doing well on gabapentin.  Denies complications or side effects.  Would like to continue gabapentin.  Diabetes mellitus type 2.  Goal A1c less than 8.  No hypo or hyperglycemic symptoms.  Compliant medication regimen denies side effects.    Coronary artery disease. Compliant with therapy. No chest pain, shortness of breath, orthopnea, PND.  No increase nitroglycerin use.  No bleeding complications from aspirin therapy noted.    Tachycardia.  Noted this weekend.  Occurred when is hot and outside and very busy and had not had anything to eat recently.  Subsequently resolved without significant sequela.  No current concern related symptoms.  No chest pain shortness of breath orthopnea PND.    Vitamin D deficiency.  Denies recent concern related symptoms.  Compliant with prescription vitamin D replacement once weekly.    Anemia.  Noted previously.  Denies evidence of ongoing blood loss.      No other complaints.  No additional recent health care visits elsewhere. No urgent care or ED visits.  No additional palliative, provocative, modifying, quantifying, qualifying, or other related factors.  Unless otherwise outlined above patient is pleased with current medication regimen, admits compliance and denies side effects of medication.    The following portions of the patient's history were reviewed and updated as appropriate: allergies, current medications, past family history, past medical history, past social history, past surgical history and problem list.    ROS: Pertinent positives and negatives are as outlined in the HPI above. The remaining balance of the 12 point review of systems is otherwise benign.      PCMH Components:      Goals         <130/80       Lack of exercise or physical activity    Increase exercise or physical activity        Get heart rate stabilized (pt-stated)         Things to think about to help me reach my goal:     What are you going to do? Take medications as prescribed, Keep positive thinking, Pace activities, Stay hydrated   How and how much? Daily   How frequent? Daily   Barriers to success?    Solutions to barriers?      06/04/17: Patient continues to work on this goal   09/29/17 Patient continues to work on this goal   12-09-2017: Reports he continues to work on this and is staying positive.        Take actions to prevent falling       12-03-2016: New goal: Will change positions slowly and pay attention to pathways. Uses cane or walker at times.   09/29/17 Patient continues to work on this goal   12-09-2017: Reports 1 fall 6-8 months ago when he missed a step and fell. Denies injury. Uses cane occasionally.              Family characteristics, patient's social characteristics, patient's cultural characteristics, patient's communication needs, patient's health literacy and behaviors affecting patient's health are outlined under the patient's History Section, Longitudinal Care Plan or Problem List in Epic or as pertinent in History of Present Illness.    Barriers to goals identified and addressed.   See Assessment and Plan for additional pertinent details.    Objective:     Physical Exam   BP 126/72  - Pulse 76  - Temp 37.4 ??C (99.3 ??F) (Oral)  - Resp 16  - Ht 182.9 cm (6' 0.01)  - Wt (!) 112.3 kg (247 lb 8 oz)  - SpO2 94%  - BMI 33.56  kg/m??   Constitutional: Oriented to person, place, and time. Appears well-developed and well-nourished. No distress.   Head: Normocephalic and atraumatic.   Mouth/Throat: Oropharynx is without mass or concerning lesions. Moist mucous membranes.    Eyes: Conjunctivae are normal. No scleral icterus.   Neck: Neck supple. Normal range of motion. No JVD present. No thyromegaly present. No concerning neck mass.   Cardiovascular: Normal rate. Regular rhythm. Normal heart sounds. No murmur heard.  Pulmonary/Chest: Effort normal. No respiratory distress. Breath sounds normal. No wheezes. No rhonchi. No rales. No focal breath sound changes. No chest tenderness.  Abdominal: Soft. Non-distended. No abdominal mass. No tenderness. No rebound. No guarding.   Musculoskeletal: No active synovitis. No concerning joint erythema, warmth or swelling.   Range of motion is without evidence of significant impairment.   Strength is without evidence of significant impairment.   No edema of the extremities.   Lymphadenopathy: There is no lymphadenopathy about the head or neck.  Neurological: Alert and oriented to person, place, and time. No cranial nerve deficit. Normal muscle tone. Normal coordination.  Skin: Skin is warm and dry. No pallor. No rash noted.   Psychiatric: Normal mood, affect and behavior. Judgment and thought content normal.

## 2021-11-18 ENCOUNTER — Ambulatory Visit: Admit: 2021-11-18 | Payer: MEDICARE | Attending: Internal Medicine | Primary: Internal Medicine

## 2021-11-18 LAB — VITAMIN D 25 HYDROXY: VITAMIN D, TOTAL (25OH): 34 ng/mL (ref 20.0–80.0)

## 2021-11-18 NOTE — Unmapped (Unsigned)
[facility] CARDIOLOGY AT [facility]  [address] - Phone: [phone]  [place], [name] [address] - Fax: [phone]     Date of Service: 11/18/2021    PCP: Dondrell Hamburger, MD  Referring Provider: [name], PA    ASSESSMENT and PLAN:     Jesus Rubio is a 76 y.o. male presenting at the request of [name], PA for evaluation and management of:  No diagnosis found.    Mr. Colavito presents in follow up. He has had improvement in CP episodes, using ntg only twice in the last 2 months. His biggest complaint is fatigue which limits him from doing most activities. This can definitely be due to ischemic HD but he is also quite deconditioned. We talked about this at length and I have recommended cardiac rehab. He is not sure if he is willing to go. I have previously reviewed his case with Jerre Simon and Hoy Finlay in [facility]. He has CTO of the left main and mid-LAD. The risk of left main CTO intervention may outweigh the benefits. I will order an echo to monitor his LV function. He is tolerating repatha.     1. 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 NSTEMI while hospitalized for COVID, managed medically with plavix. Plavix was later discontinued in favor of eliquis due to PE  - anti-platelets: plavix 6 months post PCI renal artery stenosis  - anti-anginal: metoprolol succinate 100mg  BID, imdur 60mg  daily  - statin: atorvastatin 40mg  daily => 10mg  ?muscle aches on higher dose => crestor 20mg  daily => Repatha, stop crestor  - last echo: 05/12/20 normal EF  - last stress/angiography:04/2021 see below. New since 2016 is CTO left main    2. Afib and Atrial flutter s/p ablation x2. Recurrence of paroxysmal Afib during hospitalization for COVID19 03/2020-04/2020. ER visit 02/2021 for symptomatic afib. No clear trigger.  - CHADS2-Vasc score: 6 (HTN, age+2, PE and CVA+2, IHD)  - Anti-coagulation: Eliquis  - Rate control: metoprolol as above, additional 50mg  prn dose if needed for sustained palpitations  - Rhythm control: not indicated    3. COVID19 with lengthy, complicated hospitalization 12/21-1/22. ARDS, NSTEMI, GI bleed, ARF requiring dialysis, occipital lobe CVA, PE  - has SOB with exertion, waiting for home O2    4. History of PE  - on eliquis    5. R subclavian stenosis  - BP in left arm    6. 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     [name], MD, PhD  [facility] Cardiology      ORDERS THIS VISIT:     No orders of the defined types were placed in this encounter.     New Prescriptions    No medications on file      Modified Medications    No medications on file      Discontinued Medications    No medications on file        SUBJECTIVE:     ID: Jesus Rubio is a 76 y.o. male with a history of CAD s/p CABG, aflutter s/p ablation, COVID19 with numerous complications during hospitalization 03/2020-04/2020    Reason for Visit: No chief complaint on file.     Cardiovascular History:    Previously evaluated by [name] and last saw him 05/2019. He has a history of CABG and aflutter  s/p ablation. Ziopatch 06/2019 did not show recurrence of aflutter. Prolonged hospitalization at [facility] from 03/17/20 to 04/17/20 for COVID19 with hypoxic respiratory failure due to ARDS, bilateral subsegmental PE, GI bleed requiring transfusion, NSTEMI, afib, L occipital lobe CVA.    Presented to [facility] ER 07/29/20 after an episode of syncope. ECG showed sinus rhythm with RBBB. A ziopatch was ordered. Ziopatch showed 16 short SVT episodes, appear to be atrial tach.    12/2020 right sided rib fractures    02/26/21 ER visit for afib with RVR and HTN. Prescribed prn diltiazem by ER in addition to BID metoprolol.    04/2021 PET stress for evaluation of CP, showing large reversible apical/anterior/septal defect. Referred for [facility]): interval proximal occlusion of the left main, LIMA is patent but mid LAD occlusion prevents backfilling of the proximal LAD. Unsuccessful attempt to pass a wire through CTO left main. All grafts patent. RAS 70% on the R. Also found to have differential of >65mmHg SBP/DBP between L and R arm.     05/2021 reviewed case with [name]. Plan for treatment of RAS. Do not think coronary intervention will be worth the risk. S/p DES to right renal artery. Angiography also showed right subclavian stenosis.    Interval History:    BP at home 110s-130s/70s.    One ntg last week. One ntg in April. CP came out of nowhere. Lasted 2-3 minutes.    Walks a little for exercise daily. Gets tired very easily. This is his biggest concern.    Review of Systems  10 systems were reviewed and negative except as noted in HPI.    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    Prior Cardiovascular Interventions / Surgery:  CABG 2006 LIMA-LAD,  SVG-OM, SVG-PDA  Aflutter ablation 2016, and 2017  R renal artery PCI 05/2021 [facility]    Prior Cardiovascular Diagnostics:    ECG 06/28/20 sinus rhythm 77bpm, RBBB with non-specific ST/TW abnormalities  ECG 02/26/21 afib 120bpm, RBBB  ECG 03/05/21 sinus rhythm with PACs, RBBB  ECG 04/04/21 sinus rhythm with PACs, RBBB  ECG 07/04/21 sinus rhythm, RBBB    Echo 04/11/20 EF normal  Echo 12/2020 normal EF, normal RV    Ziopatch 07/2020  - 16 short SVT episodes were recorded, the longest lasting 7.7 seconds. These appear to be atrial tachycardia.   - Otherwise unremarkable 14 day ambulatory cardiac monitor without patient triggered events.     PET Stress 04/2021  - Abnormal myocardial perfusion study  - There is a large in size, moderately severe, completely reversible perfusion defect involving the apical, apical anterior, mid anterior, basal anterior, apical septal, mid anteroseptal, basal anteroseptal, mid inferoseptal and basal inferoseptal segments. This is consistent with probable ischemia.  - Left ventricular systolic function is normal. Post stress the ejection fraction is > 60%.  - Coronary and aortic calcifications are noted  - Aortic valve calcifications are noted  - Attenuation CT scan shows post CABG  findings  - Status post cholecystectomy  - Nonspecific bilateral reticular changes in the lungs, including areas of atelectasis    LHC 05/04/21  Small area of lateral wall hypokinesis with preserved LV systolic function.  Coronary artery disease including proximal occlusion of the left main and long 70% lesion in the mid-RCA.  Prior coronary bypass surgery with patent LIMA to distal LAD, patent SVG to OM1 and OM2 and patent SVG to PDA.   70% right renal artery stenosis. Widely patent left renal artery.  Interval change since angiography on 07/14/14 was total occlusion of left main. Proximal LAD was now supplied by collaterals from PDA since occlusion of mid-LAD (present in 2016) prevented backfilling of the proximal LAD from LIMA to LAD graft.  Unsuccessful attempt to pass a wire through totally occluded left main.      Other Surgical History:   has a past surgical history that includes Replacement total knee; Cholecystectomy; Thyroidectomy, partial; Prostate surgery; Foot arthrodesis, triple; Cardiac surgery (2006); Joint replacement; pr upper gi endoscopy,biopsy (N/A, 12/01/2013); pr compre ep eval abltj 3d mapg tx svt (N/A, 05/16/2015); Knee arthroscopy; pr colonoscopy flx dx w/collj spec when pfrmd (N/A, 04/03/2020); pr gi imag intraluminal esophagus-ileum w/i&r (N/A, 04/05/2020); pr cath place/coron angio, img super/interp,w left heart ventriculography (N/A, 05/03/2021); and pr revascularize fem/pop artery,angioplasty/stent (N/A, 06/03/2021).    Current Outpatient Medications   Medication Instructions    acetaminophen (TYLENOL) 650 mg, Oral, Every 6 hours PRN    albuterol HFA 90 mcg/actuation inhaler 2 puffs, Inhalation, Every 6 hours PRN    apixaban (ELIQUIS) 5 mg, Oral, 2 times a day (standard)    blood-glucose meter kit Check BS every day    clopidogreL (PLAVIX) 75 mg, Oral, Daily (standard)    cyanocobalamin (vitamin B-12) 1,000 mcg, Subcutaneous, Every 30 days    diclofenac sodium (VOLTAREN) 2 g, Topical, 4 times a day    dilTIAZem (CARDIZEM) 30 MG tablet 1 tab Q3 hr PRN bad palpitations, HR>110.  Be sure top number blood pressure over 95.    empty container (SHARPS-A-GATOR DISPOSAL SYSTEM) Misc Use as directed for sharps disposal    ergocalciferol-1,250 mcg (50,000 unit) (DRISDOL) 1,250 mcg, Oral, Weekly    evolocumab (REPATHA SURECLICK) 140 mg/mL PnIj Inject the contents of one pen (140 mg) under the skin every fourteen (14) days.    fluticasone propionate (FLONASE) 50 mcg/actuation nasal spray 2 sprays, Each Nare, Daily (standard)    gabapentin (NEURONTIN) 100 mg, Oral, 3 times a day (standard)    glipiZIDE (GLUCOTROL) 5 mg, Oral, Daily, For diabetes.    isosorbide mononitrate (IMDUR) 30 mg, Oral, Daily (standard)    metoprolol succinate (TOPROL-XL) 100 MG 24 hr tablet TAKE 2 TABLETS BY MOUTH EVERY DAY    miscellaneous medical supply Misc Glucometer  Dx: E11.9    miscellaneous medical supply Misc Glucometer test strips   Dx: E11.9    miscellaneous medical supply Misc Lancets  Dx  E11.9    nitroglycerin (NITROLINGUAL) 0.4 mg/dose spray 1 spray, Sublingual, Every 5 min PRN    nitroglycerin (NITROSTAT) 0.4 mg, Sublingual, Every 5 min PRN, Maximum of 3 doses in 15 minutes.    omeprazole (PRILOSEC) 10 MG capsule TAKE 1 CAPSULE BY MOUTH EVERY DAY PLUS 20 MG TO EQUAL 30 MG FOR TAPERING OFF.    omeprazole (PRILOSEC) 20 mg, Oral, 2 times a day (standard)    ONETOUCH VERIO TEST STRIPS Strp USE TO CHECK BLOOD SUGAR 1-2 TIMES PER DAY    traZODone (DESYREL) 50  MG tablet TAKE 2 TABLETS BY MOUTH NIGHTLY      Allergies:  is allergic to escitalopram oxalate, ace inhibitors, and statins-hmg-coa reductase inhibitors.    Social History:  He  reports that he quit smoking about 49 years ago. His smoking use included cigarettes. He has a 15.00 pack-year smoking history. He has never used smokeless tobacco. He reports that he does not drink alcohol and does not use drugs.    Family History:  His family history includes Cancer in his brother, brother, brother, father, mother, sister, and sister; Depression in his mother; Heart attack in his brother, father, and mother; Lung cancer in his brother.       OBJECTIVE:      There were no vitals taken for this visit.     There were no vitals filed for this visit.    Wt Readings from Last 12 Encounters:   11/14/21 (!) 112 kg (247 lb)   11/13/21 (!) 112.3 kg (247 lb 8 oz)   11/13/21 (!) 112 kg (247 lb)   11/11/21 (!) 112 kg (247 lb)   11/07/21 (!) 112.5 kg (248 lb)   11/06/21 (!) 108.9 kg (240 lb)   11/04/21 (!) 111.1 kg (245 lb)   10/31/21 (!) 111.4 kg (245 lb 8 oz)   10/28/21 (!) 111.1 kg (245 lb)   10/22/21 (!) 111.6 kg (246 lb)   10/03/21 (!) 111.1 kg (245 lb)   09/04/21 (!) 111.3 kg (245 lb 6.4 oz)     BP Readings from Last 5 Encounters:   11/14/21 158/80   11/13/21 126/72   11/13/21 128/67   11/11/21 151/63   11/07/21 147/80     Pulse Readings from Last 3 Encounters:   11/14/21 73   11/13/21 76   11/13/21 78       General:  Alert, no distress.   Eyes:  EOMI, sclerae anicteric   Neck: No carotid bruit. JVP is not visible sitting upright   Resp:   CTAB bilaterally with normal WOB.   CV:  RRR with frequent ectopy, normal s1 and s2, no murmurs, normal radial pulses b/l, mild LE edema   GI: Abdomen soft, non-tender   MSK: Normal muscle tone, no joint swelling/effusion   Skin: No lesions/rashes.   Neuro: No focal deficits.   Psych: Normal mood, normal affect     Lab Results   Component Value Date    HGB 10.8 (L) 11/13/2021    HGB 11.3 (L) 07/05/2021    HGB 11.8 (L) 06/03/2021    PLT 343 11/13/2021    PLT 321 07/05/2021    PLT 299 06/03/2021     Lab Results   Component Value Date    CREATININE 1.40 (H) 11/13/2021    CREATININE 1.30 07/05/2021    CREATININE 1.23 (H) 06/03/2021    K 4.9 11/13/2021    K 4.2 07/05/2021    K 4.3 06/03/2021      Lab Results   Component Value Date    BNP 408 (H) 03/18/2020     Lab Results   Component Value Date    PROBNP 281.0 04/05/2021    PROBNP 3,890.0 (H) 02/26/2021    PROBNP 1,620.0 (H) 02/09/2021     Lab Results   Component Value Date    CHOL 129 07/05/2021    CHOL 159 05/03/2021    LDL 48 (L) 07/05/2021    LDL 97 05/03/2021    HDL 50 07/05/2021    HDL 40 05/03/2021  TRIG 153 (H) 07/05/2021    TRIG 112 05/03/2021     Lab Results   Component Value Date    A1C 6.8 (H) 11/13/2021

## 2021-11-18 NOTE — Unmapped (Signed)
Appropriate response to exercise session: yes   Exercise session goals :  Duration goal  of 30 minutes continuous exercise achieved : no  Achieved THHR : no  Recommendation to change exercise prescription: no       Post exercise orthopedic pain unchanged : yes    Does the patient report any symptoms since last visit ?  no  Does the patient report any medication changes since last visit ? no   Does the patient report any pain? no  Intervention: none needed  Pain managed of orthopedic pain through patients PCP.        Patient tolerated 15 minutes of group functional conditioning: balance training and/or strength training exercises with no complaints;  15 minutes of 1:1 individualized education in regard to disease management- patient verbalizes and demonstrates understanding.

## 2021-11-19 MED ORDER — ONETOUCH VERIO TEST STRIPS
ORAL_STRIP | 1 refills | 0 days | Status: CP
Start: 2021-11-19 — End: ?

## 2021-11-21 NOTE — Unmapped (Signed)
Does the patient report any symptoms since last visit ?  no  Does the patient report any medication changes since last visit ? no   Does the patient report any pain? Yes, 5/10 left hip pain Intervention: exercise modified to limit exacerbation of symptoms  Pain managed of orthopedic pain through patients PCP.        Patient tolerated 15 minutes of group functional conditioning: balance training and/or strength training exercises with no complaints;  15 minutes of 1:1 individualized education in regard to disease management- patient verbalizes and demonstrates understanding.        Patient attended group or individual education  yes    Topic: _____Mindfulness________  Family present:no  Learning tools   Written material : yes  Audio / Visual :yes  Hands on / models no  Discussion / lecture: yes    Patient verbalizes understanding and teaching goal met  yes

## 2021-11-21 NOTE — Unmapped (Signed)
Appropriate response to exercise session: yes   Exercise session goals :  Duration goal  of 30 minutes continuous exercise achieved : yes  Achieved THHR : no-pt's hip pain worsening which limited increasing workloads  Recommendation to change exercise prescription: no       Post exercise orthopedic pain unchanged : no-pt's left hip pain increased to a 7/10 with exercise, pt advised to decrease workloads as to not further exacerbate symptoms

## 2021-11-22 NOTE — Unmapped (Signed)
CARDIAC REHABILITATION  INDIVIDUALIZED TREATMENT PLAN  ITP Phase: 60 day   Date: 11/21/2021    Patient Name: Jesus Rubio                                           AACVPR Risk: High Risk  Angina Scale: 4+  Diagnosis:    Diagnosis ICD-10-CM Associated Orders   1. 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 Alltop has been slowly progressing workloads.  He is meeting his target heart rate.  BP is at or near goal on most rehab days.  PHQ-9 is less than 10.  Does not report having to use nitroglycerin.   Current MET level is 1.8.       Over the last the 30 days the patient has attended greater than 90% of classes yes   Any new Cardiac / pulmonary symptoms over the last 30 days?  no     Resting BP at goal? yes     Appropriate BP response with exercise? yes     Resting HR at goal? yes     Appropriate HR response with exercise? yes     Appropriate SpO2 response with exercise?  yes If no - supplemental O2 required?  no       Exercise  goals :  Duration goal  of 30 minutes continuous exercise achieved : yes   Achieved THHR : yes  Self monitoring ability achieved: yes  Home exercise plan established no  Recommendation to change exercise prescription: no    Nutrition Changes over the past 30 days: yes      Psychosocial Changes over the past 30 days: no  Reports new signs or symptom of depression or anxiety: no  Patient participating in relaxation and stress management program weekly yes  PHQ 9 below 10 yes  if no repeat PHQ 9 survey       Patient attending   education  yes  Ed needs identified - needs follow up yes    Patient compliant with medications yes              CARDIOVASCULAR ASSESSMENT        10/03/2021 10/28/2021   CARDIOVASCULAR ASSESSMENT   Cardiac Rhythm Other Regular   Heart Sounds S1;S2;Distant    Skin Color Normal    Capillary Refill Less than/equal to 3 seconds    Edema  Yes    Edema Severity Left 1+ Pitting    Left Side Location  Lower extremity    Edema Severity Right 1+ Pitting    Right Side Location  Lower extremity    Other comments hx of RBBB, hx of A-fib/flutter, however last EKG showed NSR with RBBB        EXERCISE PRESCRIPTION    EXERCISE GOALS:      10/10/2021     7:00 AM 10/28/2021    10:42 AM 11/21/2021    10:45 AM   EXERCISE GOALS   Patient goal will complete exercise assessment at  next visit to increase stamina and strength to increase stamina and strength   Other Goals  Patient will demonstrate an ability to take their own pulse;Patient will demonstrate an understanding of their exercise prescription;Patient will demonstrate knowledge of safe exercise parameters;Patient will increase the duration and/or intensity of exercise;Increase MET level as appropriate Patient will demonstrate an ability to take their own pulse;Patient will demonstrate an understanding of their exercise prescription;Patient will demonstrate knowledge of safe exercise parameters;Patient will increase the duration and/or intensity of exercise;Increase MET level as appropriate   Goal progress comment  AEB starting cardiac rehab program AEB patient increasing workloads   Progress towards goal  In Progress;Verbalizes understanding In Progress;Verbalizes understanding         EXERCISE INTERVENTIONS:      10/28/2021    10:42 AM 11/21/2021    10:45 AM   EXERCISE INTERVENTIONS   Interventions Educate patient on exercise prescription, THRR, RPE scale, and MET level;Educate patient on normal/abnormal response to exercise;Educate patient on pulse-taking techniques;Orient patient to equipment safety guidelines;Participate in warm-up and cool-down Educate patient on exercise prescription, THRR, RPE scale, and MET level;Educate patient on normal/abnormal response to exercise;Educate patient on pulse-taking techniques;Orient patient to equipment safety guidelines;Participate in warm-up and cool-down       EXERCISE ASSSESSMENT:      10/03/2021    11:00 AM 10/28/2021    10:42 AM 11/21/2021    10:45 AM   EXERCISE PRESCRIPTION   ITP Phase Initial 30 day 60 day   Special Precautions / Protocols Indicated  Continuous telemetry;Diabetic precautions;Fall precautions;Orthopedic precautions;Other Continuous telemetry;Diabetic precautions;Fall precautions;Orthopedic precautions;Other   Supplemental O2 Required?  No No   Type of exercise test  GXT    GXT (MET Level)  1.7        AEROBIC CONDITIONING / EXERCISE PLAN:      10/28/2021    10:42 AM 11/21/2021    10:45 AM   AEROBIC CONDITIONING / EXERCISE PLAN   Frequency per day 1x per day 1x per day   Frequency per week 2xs weekly Other   Aerobic minutes per session 31 - 45 31 - 45   RPE/BORG 3 - 6 3 - 6   THRR Low 95 95   THRR High 105 105   Determined Predicted HR Rest + 20-30 bpm Rest + 20-30 bpm   Rehab Progression Decrease rest interval duration;Gradually increase frequency and duration;Other Decrease rest interval duration;Gradually increase frequency and duration;Other   Progression Comments establish aerobic base of 30 minutes then progress workloads by 2% every 2 weeks progress worloads by 2% bi-weekly       AEROBIC CONDITIONING / MODES OF EXERCISE:      10/28/2021    10:42 AM 11/21/2021    10:45 AM   AEROBIC CONDITIONING / MODES OF EXERCISE:   Seated Elliptical Yes Yes   Level 1 19   Mets 1.7 1.8   Watts 15 20       STRENGTH TRAINING / EXERCISE PLAN:      10/28/2021    10:42 AM 11/21/2021    10:45 AM   RESISTANCE TRAINING   Resistance Training Yes Yes   Modes theraband;free weights;body weight theraband;free weights;body weight   Frequency 2 x week 2 x week   Intensity / RPE 3 - 6 3 - 6   Repetitions 8 - 10 10 - 12   Sets 2 2   Limitations or precautions orthopedics orthopedics   Progression Increase resistance amount when patient successfully completes the prescribed  number of  sets and reps with RPE below 3 (1-10 scale) Increase resistance amount when patient successfully completes the prescribed number of  sets and reps with RPE below 3 (1-10 scale)       OTHER TRAINING / EXERCISE PLAN:      10/28/2021    10:42 AM 11/21/2021    10:45 AM   OTHER TRAINING   Balance Training Yes - Daily dynamic balance exercises in warm up and cool down phase of aerobic exercise session. Yes - Daily dynamic balance exercises in warm up and cool down phase of aerobic exercise session.   Balance Score 3    Flexibility training type static stretching post cardio or resistance training for all major muscle groups x 16 - 30sec duration. static stretching post cardio or resistance training for all major muscle groups x 16 - 30sec duration.   Flexibility R/L Right;Left    Right score -6    Left score -5.5    Lower Body Strength 4    Home Exercise No No   Other Exercise Comments no formal HEP no formal HEP       EXERCISE EDUCATION CLASSES:      09/24/2021     7:00 AM 10/28/2021     7:00 AM 10/31/2021     7:00 AM   EXERCISE EDUCATION   Exercise Education Safe Exercise Parameters Exercise Prescription;Safe Exercise Parameters;Strength Training Other   Attend date 09/24/2021 10/28/2021 10/31/2021   Other comments contraindications to exerise  benefits of cardiac rehab         NUTRITION ASSESSMENT    NUTRITION GOALS:      10/03/2021    11:00 AM 10/28/2021    10:42 AM 10/31/2021    10:45 AM 11/21/2021    10:45 AM   NUTRITION GOALS   Patient goal to meet with RD for 1:1 consult to meet with RD for 1:1 consult Goal #1: Achieve a healthy body weight (New)    Goal #2: Attain optimal blood chemistries: non-fasting LDL, fasting lipid profile, fasting glucose and A1C (New)    Goal #3: Establish healthy eating patterns to meet nutrient needs (New) Goal #1: Achieve a healthy body weight (New)    Goal #2: Attain optimal blood chemistries: non-fasting LDL, fasting lipid profile, fasting glucose and A1C (New)    Goal #3: Establish healthy eating patterns to meet nutrient needs (New)   Other Goals Patient will lose 1-2 lbs. per week if BMI is greater than 25;Patient will decrease waist circumference Patient will lose 1-2 lbs. per week if BMI is greater than 25;Patient will decrease waist circumference Patient will lose 1-2 lbs. per week if BMI is greater than 25;Patient will improve nutrition assessment score;Patient will decrease waist circumference;Patient will increase daily water intake Patient will lose 1-2 lbs. per week if BMI is greater than 25;Patient will improve nutrition assessment score;Patient will decrease waist circumference;Patient will increase daily water intake   Progress towards goal In progress;Verbalizes understanding;Other In progress;Verbalizes understanding;Other In progress In progress   Goal progress comment will meet with RD for 1:1 consult will meet with RD for 1:1 consult  has met with RD for 1:1 consult       NUTRITION INTERVENTIONS:      10/03/2021    11:00 AM 10/28/2021    10:42 AM 10/31/2021    10:45 AM 11/21/2021    10:45 AM   NUTRITION INTERVENTIONS   Interventions Attend individual consultation with the RD Attend individual consultation with the RD Educate  the patient on healthy weight loss;Monitor weight at each exercise session;Attend individual consultation with the RD;Educate the patient on the importance of proper hydration;Educate the patient on correlation between heart disease and cholesterol status Educate the patient on healthy weight loss;Monitor weight at each exercise session;Attend individual consultation with the RD;Educate the patient on the importance of proper hydration;Educate the patient on correlation between heart disease and cholesterol status       NUTRITION ASSESSMENT:      11/07/2021     7:00 AM 11/11/2021    10:45 AM 11/13/2021    10:45 AM 11/13/2021     1:20 PM 11/14/2021 10:45 AM 11/18/2021    10:32 AM 11/21/2021    10:45 AM   NUTRITION ASSESSMENT   Weight 248 lb 247 lb 247 lb 247 lb 8 oz 247 lb 246 lb 245 lb   Height    6' 0.008      BMI (Calculated)    33.56      Weight Management       Desires weight loss;Desires dietary guidance   Oz of water daily       48   Other Nutrition Comments       Pt will need further sessions w/ RD.       Lipids:  Total Cholesterol:   Cholesterol   Date Value Ref Range Status   07/05/2021 129 100 - 199 mg/dL Final     Cholesterol, Total   Date Value Ref Range Status   07/06/2014 235 (H) 100 - 199 MG/DL Final     Trig:   Triglycerides   Date Value Ref Range Status   07/05/2021 153 (H) <150 mg/dL Final   16/01/9603 540 0 - 149 MG/DL Final     Hdl:   HDL   Date Value Ref Range Status   07/05/2021 50 >=40 mg/dL Final   98/02/9146 41 >82 MG/DL Final     Comment:     ACCORDING TO ATP-III GUIDELINES, HDL-C >59 MG/DL IS CONSIDERED A  NEGATIVE RISK FACTOR FOR CHD.       LDL:   LDL Calculated   Date Value Ref Range Status   07/05/2021 48 (L) 60 - 99 mg/dL Final     Comment:     NHLBI Recommended Ranges, LDL Cholesterol, for Adults (20+yrs) (ATPIII), mg/dL  Optimal              <956  Near Optimal        100-129  Borderline High     130-159  High                160-189  Very High            >=190  NHLBI Recommended Ranges, LDL Cholesterol, for Children (2-19 yrs), mg/dL  Desirable            <213  Borderline High     110-129  High                 >=130     07/06/2014 170 (H) 0 - 99 MG/DL Final       NUTRITION EDUCATION CLASSES:      10/10/2021     7:00 AM 10/31/2021     7:00 AM   NUTRITION EDUCATION   Nutrition Education Low Sodium;One to One Consult Healthy Eating - AHA Guidelines;One to One Consult   Attend date 10/03/2021 10/31/2021   Other comments referral to RD for 1:1 consult  PSYCHOSOCIAL ASSESSMENT    PSYCHOSOCIAL GOALS:      10/03/2021    11:00 AM 10/28/2021    10:42 AM 11/21/2021    10:45 AM   PSYCHOSOCIAL GOALS   Other Goals Patient will improve psychosocial assessment scores;Patient will maximize coping skills;Patient will demonstrate relaxation techniques;Patient will identify support systems;Patient will report decreased stress levels Patient will improve psychosocial assessment scores;Patient will maximize coping skills;Patient will demonstrate relaxation techniques;Patient will identify support systems;Patient will report decreased stress levels Patient will improve psychosocial assessment scores;Patient will maximize coping skills;Patient will demonstrate relaxation techniques;Patient will identify support systems;Patient will report decreased stress levels   Patient Goal to have more stamina and feel better, improve quality of life to have more stamina and feel better, improve quality of life to have more stamina and feel better, improve quality of life   Progress towards goal In progress;Verbalizes understanding In progress;Verbalizes understanding In progress;Verbalizes understanding   Goal progress comment initial goals set and interventions initiated AEB PHQ-9=9 AEB PHQ-9=9       PSYCHOSOCIAL INTERVENTIONS:      10/03/2021    11:00 AM 10/28/2021    10:42 AM 11/21/2021    10:45 AM   PSYCHOSOCIAL INTERVENTIONS   Interventions Attend stress management class;Instruct in relaxation techniques and coping skills Attend stress management class;Instruct in relaxation techniques and coping skills Attend stress management class;Instruct in relaxation techniques and coping skills       PSYCHOSOCIAL ASSESSMENT:      10/03/2021    11:00 AM 10/28/2021    10:42 AM 11/21/2021    10:45 AM   PSYCHOSOCIAL ASSESSEMENT   Psychosocial Method/Tool PHQ-9 PHQ-9    Psychosocial Score 9 9    Quality of Life Method/Tool Ferrans and powers     Quality of Life Score 16.11     Depression Method/Tool PHQ-9     Depression Score 9     Self-Reported Stress medium medium medium   Current State On meds On meds On meds   Other Psychosocial Comments takes trazadone for sleep, initial assessment completed, reports no s/s of depression/anxiety takes trazadone for sleep,  reports no s/s of depression/anxiety takes trazadone for sleep,  reports no s/s of depression/anxiety       PSYCHOSOCIAL EDUCATION CLASSES:      10/10/2021     7:00 AM 10/31/2021     7:00 AM 11/07/2021     7:00 AM 11/21/2021     7:00 AM   PSYCHOSOCIAL EDUCATION   Psychosocial Education Stress management;Relaxation Techniques Relaxation Techniques Relaxation Techniques Stress management;Relaxation Techniques   Attend date 10/10/2021 10/31/2021 11/07/2021 11/21/2021   Other comments initial assessment 1:1 meditation  mindfulness         OTHER CORE COMPONENTS    BLOOD PRESSURE:      10/03/2021    11:00 AM 10/28/2021    10:42 AM   BLOOD PRESSURE   Patient Goal to get to goal of 130/80 to get to goal of 130/80   Other Goals Patient will maintain appropriate blood pressure readings less than 130/80 Patient will maintain appropriate blood pressure readings less than 130/80   Interventions Monitor blood pressure at each session;Educate on importance of taking prescribed blood pressure medication correctly;Educate on importance of regular exercise program;Educate on importance of maintaining appropriate diet and weight Monitor blood pressure at each session;Educate on importance of taking prescribed blood pressure medication correctly;Educate on importance of regular exercise program;Educate on importance of maintaining appropriate diet and weight   Progress towards goal In  progress;Verbalizes understanding In progress;Verbalizes understanding   Goal progress comment initial goals set and interventions initiated AEB BP at goal at today's visit     Most recent BP: 138/77    DIABETES:      10/03/2021    11:00 AM 10/28/2021    10:42 AM   DIABETES   Patient goal to lower HgbA1C to lower HgbA1C   Other Goals Patient will maintain an acceptable blood sugar range Patient will maintain an acceptable blood sugar range   Interventions Educate patient on compliance with home blood sugar monitoring as prescribed by physician;Educate patient on signs and symptoms of hypo- and hyperglycemia;Instruct patient to monitor blood sugar before and after exercise Educate patient on compliance with home blood sugar monitoring as prescribed by physician;Educate patient on signs and symptoms of hypo- and hyperglycemia;Instruct patient to monitor blood sugar before and after exercise   HbA1c 8.3 8.3   Blood sugar 144 161   Check BS at home? Yes Yes   Progress towards goal In progress;Verbalizes understanding;Notify MD In progress;Verbalizes understanding;Notify MD   Progress towards goal - Comments message to Dr. Hollice Espy regarding diabetes education referral    Goal progress comment initial goals set and interventions initiated AEB patient will meet with RD for 1:1 consult       HEART FAILURE:      10/03/2021    11:00 AM 10/28/2021    10:42 AM   HEART FAILURE   Patient goal at goal on entry at goal on entry   EF % 55 55   Progress towards goal Yes Yes   Goal progress comment no dx of HF no dx of HF       MEDICATION COMPREHENSION:      10/03/2021    11:00 AM 10/28/2021    10:42 AM 11/21/2021    10:45 AM   MEDICATION COMPREHENSION   Patient goal to decrease need for nitroglycerin to decrease need for nitroglycerin to decrease need for nitroglycerin   Other Goals Patient will demonstrate knowledge of importance of taking meds as evidenced by verbalizing understanding of class, dosage, frequency and side effects. Patient will demonstrate knowledge of importance of taking meds as evidenced by verbalizing understanding of class, dosage, frequency and side effects. Patient will demonstrate knowledge of importance of taking meds as evidenced by verbalizing understanding of class, dosage, frequency and side effects.   Interventions Educate the patient on prescription and importance of time of day taken;Educate the patient on the class of medication, action and side effects of medication;Review dosage and frequency of medication with the patient Educate the patient on prescription and importance of time of day taken;Educate the patient on the class of medication, action and side effects of medication;Review dosage and frequency of medication with the patient Educate the patient on prescription and importance of time of day taken;Educate the patient on the class of medication, action and side effects of medication;Review dosage and frequency of medication with the patient   Medications Understood Yes Yes Yes   Meds taken % of time 100 100 100   Progress towards goal In progress;Verbalizes understanding In progress;Verbalizes understanding In progress;Verbalizes understanding   Goal progress comment AEB initial goals set and interventions initiated AEB patient starting cardiac rehab AEB patient reports decreased symptoms       TOBACCO CESSATION:      10/03/2021    11:00 AM 10/28/2021    10:42 AM 11/21/2021    10:45 AM   TOBACCO CESSATION   Patient goal  at goal on entry at goal on entry at goal on entry   Progress towards goal Yes Yes Yes   Goal progress comment AEB does not use tobacco products AEB does not use tobacco products AEB does not use tobacco products       Social History     Tobacco Use   Smoking Status Former   ??? Packs/day: 1.00   ??? Years: 15.00   ??? Additional pack years: 0.00   ??? Total pack years: 15.00   ??? Types: Cigarettes   ??? Quit date: 53   ??? Years since quitting: 49.6   Smokeless Tobacco Never       OTHER CORE COMPONENTS COMMENTS:      10/03/2021    11:00 AM 10/28/2021    10:42 AM 11/21/2021    10:45 AM   OTHER COMMENTS   Other comments CTO left main CTO left main    Goal progress comment initial goals set and interventions intiated AEB initiated cardiac rehab AEB progressing workloads and reports no symptoms       OTHER CORE COMPONENTS EDUCATION CLASSES:      09/24/2021     7:00 AM 10/10/2021     7:00 AM 11/07/2021     7:00 AM   OTHER CORE COMPONENT EDUCATION   Core Component Ed Other Diabetes;Heart Anatomy;Medications;MI and [place];Risk Factors Risk factors   Attend Date 09/24/2021 10/03/2021 11/07/2021   Other Comments orientation to CR 1:1            Signed:  Pilar Plate, RN  11/21/2021 9:53 PM

## 2021-11-23 DIAGNOSIS — E119 Type 2 diabetes mellitus without complications: Principal | ICD-10-CM

## 2021-11-23 MED ORDER — GLIPIZIDE 5 MG TABLET
ORAL_TABLET | Freq: Every day | ORAL | 0 refills | 90 days | Status: CP
Start: 2021-11-23 — End: ?

## 2021-11-23 NOTE — Unmapped (Signed)
I have reviewed and approved patient's 60 day ITP for continuation in the cardiac rehab program with noted changes to their exercise prescription and therapy goals.     [name], MD, PhD  [facility] Card[facility]

## 2021-11-25 ENCOUNTER — Institutional Professional Consult (permissible substitution): Admit: 2021-11-25 | Payer: MEDICARE

## 2021-11-25 ENCOUNTER — Ambulatory Visit: Admit: 2021-11-25 | Discharge: 2021-12-22 | Payer: MEDICARE

## 2021-11-25 ENCOUNTER — Institutional Professional Consult (permissible substitution): Admit: 2021-11-25 | Discharge: 2021-12-22 | Payer: MEDICARE

## 2021-11-25 ENCOUNTER — Ambulatory Visit: Admit: 2021-11-25 | Payer: MEDICARE

## 2021-11-25 NOTE — Unmapped (Signed)
Does the patient report any symptoms since last visit ?  no  Does the patient report any medication changes since last visit ? no   Does the patient report any pain? Yes, 5/10 left hip pain and bilateral arm pain  Intervention: none needed  Pain managed of orthopedic pain through patients PCP.        Patient tolerated 15 minutes of group functional conditioning: balance training and/or strength training exercises with no complaints;  15 minutes of 1:1 individualized education in regard to disease management- patient verbalizes and demonstrates understanding.        Patient attended group or individual education  no      Appropriate response to exercise session: yes   Exercise session goals :  Duration goal  of 30 minutes continuous exercise achieved : yes  Achieved THHR : no  Recommendation to change exercise prescription: no       Post exercise orthopedic pain unchanged : yes

## 2021-11-26 ENCOUNTER — Ambulatory Visit: Admit: 2021-11-26 | Discharge: 2021-11-27 | Payer: MEDICARE | Attending: Internal Medicine | Primary: Internal Medicine

## 2021-11-26 DIAGNOSIS — E785 Hyperlipidemia, unspecified: Principal | ICD-10-CM

## 2021-11-26 DIAGNOSIS — I152 Hypertension secondary to endocrine disorders: Principal | ICD-10-CM

## 2021-11-26 DIAGNOSIS — I208 Other forms of angina pectoris: Principal | ICD-10-CM

## 2021-11-26 DIAGNOSIS — Z789 Other specified health status: Principal | ICD-10-CM

## 2021-11-26 DIAGNOSIS — E1169 Type 2 diabetes mellitus with other specified complication: Principal | ICD-10-CM

## 2021-11-26 DIAGNOSIS — I240 Acute coronary thrombosis not resulting in myocardial infarction: Principal | ICD-10-CM

## 2021-11-26 DIAGNOSIS — I259 Chronic ischemic heart disease, unspecified: Principal | ICD-10-CM

## 2021-11-26 DIAGNOSIS — I771 Stricture of artery: Principal | ICD-10-CM

## 2021-11-26 DIAGNOSIS — E119 Type 2 diabetes mellitus without complications: Principal | ICD-10-CM

## 2021-11-26 DIAGNOSIS — E1159 Type 2 diabetes mellitus with other circulatory complications: Principal | ICD-10-CM

## 2021-11-26 DIAGNOSIS — I701 Atherosclerosis of renal artery: Principal | ICD-10-CM

## 2021-11-26 NOTE — Unmapped (Signed)
[facility] CARDIOLOGY AT [facility]  [address] - Phone: [phone]  [place], [name] [address] - Fax: [phone]     Date of Service: 11/26/2021    PCP: Leobardo Hamburger, MD  Referring Provider: [name], PA    ASSESSMENT and PLAN:     Jesus Rubio is a 76 y.o. male presenting at the request of [name], PA for evaluation and management of:  1. 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 Chronic stable angina (CMS-HCC)    3. Hypertension associated with diabetes (CMS-HCC)    4. Renal artery stenosis (CMS-HCC)    5. Subclavian arterial stenosis (CMS-HCC)    6. Hyperlipidemia associated with type 2 diabetes mellitus (CMS-HCC)    7. Type 2 diabetes mellitus without complication, without long-term current use of insulin (CMS-HCC)    8. Statin intolerance      [name] presents in follow up.  He has had 1 episode of chest discomfort requiring nitroglycerin.  Going to cardiac rehab and has not had significant chest discomfort with exercise there.  His blood pressure is well controlled.  He continues to have upper arm discomfort particularly in the right arm is considering surgery for removal of the mass there.  He does have pain in his lower legs with numbness and tingling.  Likelihood of peripheral arterial disease is high given his vascular history.  We discussed arterial ultrasound to evaluate for blood flow to the legs but will hold off at this time.  Symptoms do fit more so with neurogenic pain.  He does not have sores or ulcers on his legs. He is 6 months post PCI to renal artery. Switch to aspirin instead of plavix. Continue eliquis.    1. 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 NSTEMI while hospitalized for COVID, managed medically with plavix. Plavix was later discontinued in favor of eliquis due to PE  - anti-platelets: plavix 6 months post PCI renal artery stenosis => switch to aspirin 12/2021  - anti-anginal: metoprolol succinate 100mg  BID, imdur 60mg  daily  - statin: atorvastatin 40mg  daily => 10mg  ?muscle aches on higher dose => crestor 20mg  daily => Repatha, stop crestor  - last echo: 05/12/20 normal EF  - last stress/angiography:04/2021 see below. New since 2016 is CTO left main    2. Afib and Atrial flutter s/p ablation x2. Recurrence of paroxysmal Afib during hospitalization for COVID19 03/2020-04/2020. ER visit 02/2021 for symptomatic afib. No clear trigger.  - CHADS2-Vasc score: 6 (HTN, age+2, PE and CVA+2, IHD)  - Anti-coagulation: Eliquis  - Rate control: metoprolol as above, additional 50mg  prn dose if needed for sustained palpitations  - Rhythm control: not indicated    3. COVID19 with lengthy, complicated hospitalization 12/21-1/22. ARDS, NSTEMI, GI bleed, ARF requiring dialysis, occipital lobe CVA, PE  - has SOB with exertion, waiting for home O2    4. History of PE  - on eliquis    5. R subclavian stenosis  - BP in left arm    6. Renal artery stenosis s/p R renal artery DES 05/2021  - plavix for 6 months    Follow-up: Return in about 4 months (around 03/28/2022).     [name], MD, PhD  [facility] Cardiology      ORDERS THIS VISIT:     No orders of the defined types were placed in this encounter.     New Prescriptions  No medications on file      Modified Medications    No medications on file      Discontinued Medications    No medications on file        SUBJECTIVE:     ID: [name] is a 76 y.o. male with a history of CAD s/p CABG, aflutter s/p ablation, COVID19 with numerous complications during hospitalization 03/2020-04/2020    Reason for Visit: Routine Follow-up (States that BP has been good. Doing physical therapy 3 times weekly. No chest pain, SOB)     Cardiovascular History:    Previously evaluated by [name] and last saw him 05/2019. He has a history of CABG and aflutter s/p ablation. Ziopatch 06/2019 did not show recurrence of aflutter. Prolonged hospitalization at [facility] from 03/17/20 to 04/17/20 for COVID19 with hypoxic respiratory failure due to ARDS, bilateral subsegmental PE, GI bleed requiring transfusion, NSTEMI, afib, L occipital lobe CVA.    Presented to [facility] ER 07/29/20 after an episode of syncope. ECG showed sinus rhythm with RBBB. A ziopatch was ordered. Ziopatch showed 16 short SVT episodes, appear to be atrial tach.    12/2020 right sided rib fractures    02/26/21 ER visit for afib with RVR and HTN. Prescribed prn diltiazem by ER in addition to BID metoprolol.    04/2021 PET stress for evaluation of CP, showing large reversible apical/anterior/septal defect. Referred for [facility]): interval proximal occlusion of the left main, LIMA is patent but mid LAD occlusion prevents backfilling of the proximal LAD. Unsuccessful attempt to pass a wire through CTO left main. All grafts patent. RAS 70% on the R. Also found to have differential of >55mmHg SBP/DBP between L and R arm.     05/2021 reviewed case with [name]. Plan for treatment of RAS. Do not think coronary intervention will be worth the risk. S/p DES to right renal artery. Angiography also showed right subclavian stenosis.    Interval History:    Going to cardiac rehab. Has had some chest discomfort periodically but not consistently. No pattern. One time used nitroglycerin.    BP at home has been ok.    Most discomfort at cardiac rehab is with the upper arms both left and right. Upper right arm stays numb. Left arm seems to have gotten better.     No sig chest discomfort at cardiac rehab.    Has leg pain all the time. Below the knee feels really tight. Has numbness and tingling. Hurts at rest and with walking. No sores/wounds.      Review of Systems  10 systems were reviewed and negative except as noted in HPI.    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    Prior Cardiovascular Interventions / Surgery:  CABG 2006 LIMA-LAD,  SVG-OM, SVG-PDA  Aflutter ablation 2016, and 2017  R renal artery PCI 05/2021 [place]    Prior Cardiovascular Diagnostics:    ECG 06/28/20 sinus rhythm 77bpm, RBBB with  non-specific ST/TW abnormalities  ECG 02/26/21 afib 120bpm, RBBB  ECG 03/05/21 sinus rhythm with PACs, RBBB  ECG 04/04/21 sinus rhythm with PACs, RBBB  ECG 07/04/21 sinus rhythm, RBBB    Echo 04/11/20 EF normal  Echo 12/2020 normal EF, normal RV    Ziopatch 07/2020  - 16 short SVT episodes were recorded, the longest lasting 7.7 seconds. These appear to be atrial tachycardia.   - Otherwise unremarkable 14 day ambulatory cardiac monitor without patient triggered events.     PET Stress 04/2021  - Abnormal myocardial perfusion study  - There is a large in size, moderately severe, completely reversible perfusion defect involving the apical, apical anterior, mid anterior, basal anterior, apical septal, mid anteroseptal, basal anteroseptal, mid inferoseptal and basal inferoseptal segments. This is consistent with probable ischemia.  - Left ventricular systolic function is normal. Post stress the ejection fraction is > 60%.  - Coronary and aortic calcifications are noted  - Aortic valve calcifications are noted  - Attenuation CT scan shows post CABG findings  - Status post cholecystectomy  - Nonspecific bilateral reticular changes in the lungs, including areas of atelectasis    LHC 05/04/21  Small area of lateral wall hypokinesis with preserved LV systolic function.  Coronary artery disease including proximal occlusion of the left main and long 70% lesion in the mid-RCA.  Prior coronary bypass surgery with patent LIMA to distal LAD, patent SVG to OM1 and OM2 and patent SVG to PDA.   70% right renal artery stenosis. Widely patent left renal artery.  Interval change since angiography on 07/14/14 was total occlusion of left main. Proximal LAD was now supplied by collaterals from PDA since occlusion of mid-LAD (present in 2016) prevented backfilling of the proximal LAD from LIMA to LAD graft.  Unsuccessful attempt to pass a wire through totally occluded left main.      Other Surgical History:   has a past surgical history that includes Replacement total knee; Cholecystectomy; Thyroidectomy, partial; Prostate surgery; Foot arthrodesis, triple; Cardiac surgery (2006); Joint replacement; pr upper gi endoscopy,biopsy (N/A, 12/01/2013); pr compre ep eval abltj 3d mapg tx svt (N/A, 05/16/2015); Knee arthroscopy; pr colonoscopy flx dx w/collj spec when pfrmd (N/A, 04/03/2020); pr gi imag intraluminal esophagus-ileum w/i&r (N/A, 04/05/2020); pr cath place/coron angio, img super/interp,w left heart ventriculography (N/A, 05/03/2021); and pr revascularize fem/pop artery,angioplasty/stent (N/A, 06/03/2021).    Current Outpatient Medications   Medication Instructions    acetaminophen (TYLENOL) 650 mg, Oral, Every 6 hours PRN    albuterol HFA 90 mcg/actuation inhaler 2 puffs, Inhalation, Every 6 hours PRN    apixaban (ELIQUIS) 5 mg, Oral, 2 times a day (standard)    blood-glucose meter kit Check BS every day    clopidogreL (PLAVIX) 75 mg, Oral, Daily (standard)    cyanocobalamin (vitamin B-12) 1,000 mcg, Subcutaneous, Every 30 days    diclofenac sodium (VOLTAREN) 2 g, Topical, 4 times a day    dilTIAZem (CARDIZEM) 30 MG tablet 1 tab Q3 hr PRN bad palpitations, HR>110.  Be sure top number blood pressure over 95.    empty container (SHARPS-A-GATOR DISPOSAL SYSTEM) Misc Use as directed for sharps disposal    ergocalciferol-1,250 mcg (50,000 unit) (DRISDOL) 1,250 mcg, Oral, Weekly    evolocumab (REPATHA SURECLICK) 140 mg/mL PnIj Inject the contents of one pen (140 mg) under the skin every fourteen (14) days.    fluticasone propionate (FLONASE) 50 mcg/actuation nasal spray 2 sprays, Each Nare, Daily (standard)    gabapentin (NEURONTIN) 100 mg, Oral, 3  times a day (standard)    glipiZIDE (GLUCOTROL) 5 mg, Oral, Daily, For diabetes.    isosorbide mononitrate (IMDUR) 30 mg, Oral, Daily (standard)    metoprolol succinate (TOPROL-XL) 100 MG 24 hr tablet TAKE 2 TABLETS BY MOUTH EVERY DAY    miscellaneous medical supply Misc Glucometer  Dx: E11.9    miscellaneous medical supply Misc Glucometer test strips   Dx: E11.9    miscellaneous medical supply Misc Lancets  Dx  E11.9    nitroglycerin (NITROLINGUAL) 0.4 mg/dose spray 1 spray, Sublingual, Every 5 min PRN    nitroglycerin (NITROSTAT) 0.4 mg, Sublingual, Every 5 min PRN, Maximum of 3 doses in 15 minutes.    omeprazole (PRILOSEC) 10 MG capsule TAKE 1 CAPSULE BY MOUTH EVERY DAY PLUS 20 MG TO EQUAL 30 MG FOR TAPERING OFF.    omeprazole (PRILOSEC) 20 mg, Oral, 2 times a day (standard)    ONETOUCH VERIO TEST STRIPS Strp USE TO CHECK BLOOD SUGAR 1-2 TIMES PER DAY    traZODone (DESYREL) 50 MG tablet TAKE 2 TABLETS BY MOUTH NIGHTLY      Allergies:  is allergic to escitalopram oxalate, ace inhibitors, and statins-hmg-coa reductase inhibitors.    Social History:  He  reports that he quit smoking about 49 years ago. His smoking use included cigarettes. He has a 15.00 pack-year smoking history. He has never used smokeless tobacco. He reports that he does not drink alcohol and does not use drugs.    Family History:  His family history includes Cancer in his brother, brother, brother, father, mother, sister, and sister; Depression in his mother; Heart attack in his brother, father, and mother; Lung cancer in his brother.       OBJECTIVE:      BP 130/78  - Pulse 66  - Temp 37.3 ??C (99.1 ??F) (Temporal)  - Resp 16  - Ht 182.9 cm (6' 0.01)  - Wt (!) 112.1 kg (247 lb 3.2 oz)  - SpO2 95%  - BMI 33.52 kg/m??      Vitals:    11/26/21 1254   BP: 130/78   Pulse: 66   Resp: 16   Temp: 37.3 ??C (99.1 ??F)   TempSrc: Temporal   SpO2: 95%   Weight: (!) 112.1 kg (247 lb 3.2 oz)   Height: 182.9 cm (6' 0.01)       Wt Readings from Last 12 Encounters:   11/26/21 (!) 112.1 kg (247 lb 3.2 oz)   11/25/21 (!) 111.6 kg (246 lb)   11/21/21 (!) 111.1 kg (245 lb)   11/18/21 (!) 111.6 kg (246 lb)   11/14/21 (!) 112 kg (247 lb)   11/13/21 (!) 112.3 kg (247 lb 8 oz)   11/13/21 (!) 112 kg (247 lb)   11/11/21 (!) 112 kg (247 lb)   11/07/21 (!) 112.5 kg (248 lb)   11/06/21 (!) 108.9 kg (240 lb)   11/04/21 (!) 111.1 kg (245 lb)   10/31/21 (!) 111.4 kg (245 lb 8 oz)     BP Readings from Last 5 Encounters:   11/26/21 130/78   11/25/21 145/74   11/21/21 138/77   11/18/21 138/74   11/14/21 158/80     Pulse Readings from Last 3 Encounters:   11/26/21 66   11/25/21 100   11/21/21 75       General:  Alert, no distress.   Eyes:  EOMI, sclerae anicteric   Neck: No carotid bruit. JVP is not visible sitting upright   Resp:  CTAB bilaterally with normal WOB.   CV:  RRR with frequent ectopy, normal s1 and s2, no murmurs, normal radial pulses b/l, mild LE edema   GI:   Abdomen soft, non-tender   MSK: Normal muscle tone, no joint swelling/effusion   Skin: No lesions/rashes.   Neuro: No focal deficits.   Psych: Normal mood, normal affect     Lab Results   Component Value Date    HGB 10.8 (L) 11/13/2021    HGB 11.3 (L) 07/05/2021    HGB 11.8 (L) 06/03/2021    PLT 343 11/13/2021    PLT 321 07/05/2021    PLT 299 06/03/2021     Lab Results   Component Value Date    CREATININE 1.40 (H) 11/13/2021    CREATININE 1.30 07/05/2021    CREATININE 1.23 (H) 06/03/2021    K 4.9 11/13/2021    K 4.2 07/05/2021    K 4.3 06/03/2021      Lab Results   Component Value Date    BNP 408 (H) 03/18/2020     Lab Results   Component Value Date    PROBNP 281.0 04/05/2021    PROBNP 3,890.0 (H) 02/26/2021    PROBNP 1,620.0 (H) 02/09/2021     Lab Results   Component Value Date    CHOL 129 07/05/2021    CHOL 159 05/03/2021    LDL 48 (L) 07/05/2021    LDL 97 05/03/2021    HDL 50 07/05/2021    HDL 40 05/03/2021    TRIG 161 (H) 07/05/2021    TRIG 112 05/03/2021     Lab Results   Component Value Date    A1C 6.8 (H) 11/13/2021

## 2021-11-26 NOTE — Unmapped (Signed)
I can refer you for a ultrasound of the leg arteries to evaluate blood flow but my suspicion for blockage in the leg arteries is high. The question is what can be done about it. Let me know if you want me to order it.    No medication changes today.    Follow up 4 months    [name], MD, PhD  [facility] Cardiology

## 2021-11-27 NOTE — Unmapped (Signed)
Does the patient report any symptoms since last visit ?  no  Does the patient report any medication changes since last visit ? no   Does the patient report any pain? Yes, 5/10 arms  Intervention: none needed  Pain managed of orthopedic pain through patients PCP.        Patient tolerated 15 minutes of group functional conditioning: balance training and/or strength training exercises with no complaints;  15 minutes of 1:1 individualized education in regard to disease management- patient verbalizes and demonstrates understanding.        Patient attended group or individual education  No        Appropriate response to exercise session: yes   Exercise session goals :  Duration goal  of 30 minutes continuous exercise achieved : yes  Achieved THHR : yes  Recommendation to change exercise prescription: no       Post exercise orthopedic pain unchanged : yes

## 2021-11-27 NOTE — Unmapped (Signed)
Patient's individual's treatment plan and progression towards goals was reviewed at the multidisciplinary staff meeting on 11/27/21.  Recommendations are recorded in the below:  Exercise: No change in Ex Rx- continue to encourage home exercise on non rehab days.   Nutrition: No changes - continues to reinforce heart healthy nutrition   Psychosocial: continue to provide weekly relaxation exercise

## 2021-11-28 NOTE — Unmapped (Signed)
Does the patient report any symptoms since last visit ?  no  Does the patient report any medication changes since last visit ? no   Does the patient report any pain? no  Intervention: none needed  Pain managed of orthopedic pain through patients PCP.        Patient tolerated 15 minutes of group functional conditioning: balance training and/or strength training exercises with no complaints;  15 minutes of 1:1 individualized education in regard to disease management- patient verbalizes and demonstrates understanding.        Patient attended group or individual education  yes      Appropriate response to exercise session: yes   Exercise session goals :  Duration goal  of 30 minutes continuous exercise achieved : yes  Achieved THHR : no  Recommendation to change exercise prescription: no       Post exercise orthopedic pain unchanged : yes

## 2021-12-03 MED FILL — REPATHA SURECLICK 140 MG/ML SUBCUTANEOUS PEN INJECTOR: SUBCUTANEOUS | 28 days supply | Qty: 2 | Fill #5

## 2021-12-03 NOTE — Unmapped (Signed)
LM for patient to return call re medication changes as stated below:  One medication change. Stop plavix when your current pills run out and switch to aspirin 81mg  daily. Keep taking eliquis

## 2021-12-04 MED ORDER — APIXABAN 5 MG TABLET
ORAL_TABLET | Freq: Two times a day (BID) | ORAL | 1 refills | 90 days | Status: CP
Start: 2021-12-04 — End: 2022-12-04

## 2021-12-04 NOTE — Unmapped (Signed)
Does the patient report any symptoms since last visit ?  no  Does the patient report any medication changes since last visit ? no   Pain assessment completed in pre-exercise flow sheet.  Contraindications identified? no      Patient tolerated 15 minutes of group functional conditioning: balance training and/or strength training exercises with no complaints;  15 minutes of 1:1 individualized education in regard to disease management- patient verbalizes and demonstrates understanding.      Appropriate response to exercise session: yes   Exercise session goals :  Individual exercise goal achieved : no, not hitting thr. Arm pain and fatigue        Patient attended group or individual education  no

## 2021-12-04 NOTE — Unmapped (Signed)
Patient is requesting the following refill  Requested Prescriptions     Pending Prescriptions Disp Refills    ELIQUIS 5 mg Tab [Pharmacy Med Name: ELIQUIS 5 MG TABLET] 60 tablet 5     Sig: TAKE 1 TABLET BY MOUTH TWO TIMES A DAY.       Recent Visits  Date Type Provider Dept   11/13/21 Office Visit [name], MD [place] [facility]   10/22/21 Office Visit [name], MD [place] [facility]   08/08/21 Office Visit [name], MD [place] [facility]   07/25/21 Office Visit [name], MD [place] [facility]   07/04/21 Office Visit [name], MD [place] [facility]   04/05/21 Office Visit [name], [place] [facility]   02/22/21 Office Visit [name], [place] [facility]   02/13/21 Office Visit [name], [place] [facility]   01/18/21 Office Visit [name], [place] [facility]   01/01/21 Office Visit [name], [place] [facility]   Showing recent visits within past 365 days with a meds authorizing provider and meeting all other requirements  Future Appointments  Date Type Provider Dept   01/07/22 Appointment [name], MD [place] [facility]   Showing future appointments within next 365 days with a meds authorizing provider and meeting all other requirements       Labs: Not applicable this refill

## 2021-12-04 NOTE — Unmapped (Signed)
Returned call made aware

## 2021-12-05 NOTE — Unmapped (Signed)
Does the patient report any symptoms since last visit ?  No new symptoms - cont with chronic arm and hip pain  Does the patient report any medication changes since last visit ? no   Pain assessment completed in pre-exercise flow sheet.  Contraindications identified? no       Patient tolerated 15 minutes of group functional conditioning: balance training and/or strength training exercises with no complaints;  15 minutes of 1:1 individualized education in regard to disease management- patient verbalizes and demonstrates understanding.      Appropriate response to exercise session: yes   Exercise session goals :  Individual exercise goal achieved : yes        Patient attended group or individual education  yes    Family present:no  Learning tools:  Written material : no  Audio / Visual :yes  Hands on / models no  Discussion / lecture: yes    Patient verbalizes understanding and teaching goal met  yes

## 2021-12-10 NOTE — Unmapped (Unsigned)
12/10/21 Pt just refill his medication on 12/02/21. I will reschedule the refill call sed

## 2021-12-10 NOTE — Unmapped (Signed)
Hemoglobin and hematocrit are slightly low.  Ferritin is low and additional tests related to iron are out of range and suggestive of iron deficiency anemia.  Clinically iron deficiency anemia is most likely secondary to ongoing blood loss.    Creatinine is slightly elevated consistent with mild renal strain.    Continue current medications and recommendations.  I recommend scheduling follow-up sooner than currently scheduled for reevaluation and additional discussion and decision making.

## 2021-12-11 DIAGNOSIS — G4700 Insomnia, unspecified: Principal | ICD-10-CM

## 2021-12-11 MED ORDER — TRAZODONE 50 MG TABLET
ORAL_TABLET | Freq: Every evening | ORAL | 0 refills | 90 days | Status: CP
Start: 2021-12-11 — End: ?

## 2021-12-11 NOTE — Unmapped (Signed)
Does the patient report any symptoms since last visit ?  no  Does the patient report any medication changes since last visit ? no   Pain assessment completed in pre-exercise flow sheet.  Contraindications identified? no       Patient tolerated 15 minutes of group functional conditioning: balance training and/or strength training exercises with no complaints;  15 minutes of 1:1 individualized education in regard to disease management- patient verbalizes and demonstrates understanding.      Appropriate response to exercise session: yes   Exercise session goals :  Individual exercise goal achieved : yes        Patient attended group or individual education  no

## 2021-12-11 NOTE — Unmapped (Signed)
Patient is requesting the following refill  Requested Prescriptions     Pending Prescriptions Disp Refills    traZODone (DESYREL) 50 MG tablet [Pharmacy Med Name: TRAZODONE 50 MG TABLET] 90 tablet 0     Sig: TAKE 1 TABLET BY MOUTH EVERY DAY AT NIGHT       Recent Visits  Date Type Provider Dept   11/13/21 Office Visit [name], MD [place] [facility]   10/22/21 Office Visit [name], MD [place] [facility]   08/08/21 Office Visit [name], MD [place] [facility]   07/25/21 Office Visit [name], MD [place] [facility]   07/04/21 Office Visit [name], MD [place] [facility]   04/05/21 Office Visit [name], [place] [facility]   02/22/21 Office Visit [name], [place] [facility]   02/13/21 Office Visit [name][place] [facility]   01/18/21 Office Visit [name], [place] [facility]   01/01/21 Office Visit [name][place] [facility]   Showing recent visits within past 365 days with a meds authorizing provider and meeting all other requirements  Future Appointments  Date Type Provider Dept   01/07/22 Appointment [name], MD [place] [facility] At [facility]   Showing future appointments within next 365 days with a meds authorizing provider and meeting all other requirements

## 2021-12-12 NOTE — Unmapped (Signed)
Patient advised of lab results and verbalized understanding Appointment scheduled for 9/14 3:20

## 2021-12-12 NOTE — Unmapped (Signed)
Does the patient report any symptoms since last visit ?  no  Does the patient report any medication changes since last visit ? no   Pain assessment completed in pre-exercise flow sheet.  Contraindications identified? no       Patient tolerated 15 minutes of group functional conditioning: balance training and/or strength training exercises with no complaints;  15 minutes of 1:1 individualized education in regard to disease management- patient verbalizes and demonstrates understanding.      Appropriate response to exercise session: yes   Exercise session goals :  Individual exercise goal achieved : yes        Patient attended group or individual education  yes    Family present:no  Learning tools:  Written material : yes  Audio / Visual :yes  Hands on / models no  Discussion / lecture: yes    Patient verbalizes understanding and teaching goal met  yes

## 2021-12-12 NOTE — Unmapped (Signed)
-----   Message from [name], MD sent at 12/09/2021  7:11 PM EDT -----  Hemoglobin and hematocrit are slightly low.  Ferritin is low and additional tests related to iron are out of range and suggestive of iron deficiency anemia.  Clinically iron deficiency anemia is most likely secondary to ongoing blood loss.    Creatinine is slightly elevated consistent with mild renal strain.    Continue current medications and recommendations.  I recommend scheduling follow-up sooner than currently scheduled for reevaluation and additional discussion and decision making.

## 2021-12-14 NOTE — Unmapped (Signed)
CARDIAC REHABILITATION   INDIVIDUALIZED TREATMENT PLAN  ITP Phase: 60 day   Date: 12/13/21    Patient Name: Jesus Rubio                                           AACVPR Risk: High Risk  Angina Scale: 4+  Diagnosis:    Diagnosis ICD-10-CM Associated Orders   1. 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  Brief synopsis of  [name] progress over the past 30 days:    Reported new cardiac symptoms No.  Incidents at rehab  No.    Exercise: Demonstrates independence on all exercise modalities, warm up and cool down. Requires assistance and 1:1 monitoring for safety/fall prevention     Demonstrates correct use of RPE scales and gauging intensity (understands THRR).  Yes.  Limitations to progression  Yes., orthopedic, chronic pain    Nutrition goal to meet 1:1 with RD and improve heart healthy eating habits on track  will need continued reinforcement  Psychosocial: interacts well with staff and other participants; denies S & S of depression Participates in weekly relaxation classes and demonstrates ability to perform independently.  Yes.  Tobacco status at goal Yes.  Medication compliance at goal Yes.    Card Rehab Vitals         11/25/2021   11/26/2021   11/27/2021   11/28/2021   12/04/2021   12/05/2021   12/11/2021   12/12/2021      BP 145/74 130/78 142/67 142/80 154/79 145/72 128/71 136/81    Weight 111.6 kg (246 lb) 112.1 kg (247 lb 3.2 oz) 112 kg (247 lb) 111.6 kg (246 lb) 107 kg (236 lb) 107 kg (236 lb) 111.6 kg (246 lb) 111.6 kg (246 lb)                          EXERCISE PRESCRIPTION    EXERCISE ASSSESSMENT:      10/03/2021    11:00 AM 10/28/2021    10:42 AM 11/21/2021    10:45 AM 12/13/2021     7:00 AM   EXERCISE PRESCRIPTION   ITP Phase Initial 30 day 60 day 60 day   Special Precautions / Protocols Indicated  Continuous telemetry;Diabetic precautions;Fall precautions;Orthopedic precautions;Other Continuous telemetry;Diabetic precautions;Fall precautions;Orthopedic precautions;Other Continuous telemetry;Diabetic precautions;Fall precautions;Orthopedic precautions;Other   Supplemental O2 Required?  No No No   Type of exercise test  GXT     GXT (MET Level)  1.7           EXERCISE GOALS:      10/10/2021     7:00 AM 10/28/2021    10:42 AM 11/21/2021    10:45 AM 12/13/2021     7:00 AM   EXERCISE GOALS   Patient goal will complete exercise assessment at next visit to increase  stamina and strength to increase stamina and strength to increase stamina and strength   Other Goals  Patient will demonstrate an ability to take their own pulse;Patient will demonstrate an understanding of their exercise prescription;Patient will demonstrate knowledge of safe exercise parameters;Patient will increase the duration and/or intensity of exercise;Increase MET level as appropriate Patient will demonstrate an ability to take their own pulse;Patient will demonstrate an understanding of their exercise prescription;Patient will demonstrate knowledge of safe exercise parameters;Patient will increase the duration and/or intensity of exercise;Increase MET level as appropriate Patient will demonstrate an ability to take their own pulse;Patient will demonstrate an understanding of their exercise prescription;Patient will demonstrate knowledge of safe exercise parameters;Patient will increase the duration and/or intensity of exercise;Increase MET level as appropriate   Goal progress comment  AEB starting cardiac rehab program AEB patient increasing workloads AEB patient increasing workloads and getting on TM   Progress towards goal  In Progress;Verbalizes understanding In Progress;Verbalizes understanding In Progress;Verbalizes understanding       EXERCISE INTERVENTIONS:      10/28/2021    10:42 AM 11/21/2021    10:45 AM 12/13/2021     7:00 AM   EXERCISE INTERVENTIONS   Interventions Educate patient on exercise prescription, THRR, RPE scale, and MET level;Educate patient on normal/abnormal response to exercise;Educate patient on pulse-taking techniques;Orient patient to equipment safety guidelines;Participate in warm-up and cool-down Educate patient on exercise prescription, THRR, RPE scale, and MET level;Educate patient on normal/abnormal response to exercise;Educate patient on pulse-taking techniques;Orient patient to equipment safety guidelines;Participate in warm-up and cool-down Educate patient on exercise prescription, THRR, RPE scale, and MET level;Educate patient on normal/abnormal response to exercise;Educate patient on pulse-taking techniques;Orient patient to equipment safety guidelines;Participate in warm-up and cool-down         AEROBIC CONDITIONING / EXERCISE PLAN:      10/28/2021    10:42 AM 11/21/2021    10:45 AM 12/13/2021     7:00 AM   AEROBIC CONDITIONING / EXERCISE PLAN   Frequency per day 1x per day 1x per day 1x per day   Frequency per week 2xs weekly Other Other   Aerobic minutes per session 31 - 45 31 - 45 31 - 45   RPE/BORG 3 - 6 3 - 6 3 - 6   THRR Low 95 95 95   THRR High 105 105 105   Determined Predicted HR Rest + 20-30 bpm Rest + 20-30 bpm Rest + 20-30 bpm   Rehab Progression Decrease rest interval duration;Gradually increase frequency and duration;Other Decrease rest interval duration;Gradually increase frequency and duration;Other Decrease rest interval duration;Gradually increase frequency and duration;Other   Progression Comments establish aerobic base of 30 minutes then progress workloads by 2% every 2 weeks progress worloads by 2% bi-weekly progress worloads by 2% bi-weekly       AEROBIC CONDITIONING / MODES OF EXERCISE:      10/28/2021    10:42 AM 11/21/2021    10:45 AM 12/13/2021     7:00 AM   AEROBIC CONDITIONING / MODES OF EXERCISE:   Seated Elliptical Yes Yes Yes   Level 1 19 2    Mets 1.7 1.8 2.1   Watts 15 20 30    Treadmill   Yes   Incline   0   Mets   1.8   MPH   1       STRENGTH TRAINING / EXERCISE PLAN:      10/28/2021    10:42 AM 11/21/2021    10:45 AM 12/13/2021  7:00 AM   RESISTANCE TRAINING   Resistance Training Yes Yes Yes   Modes theraband;free weights;body weight theraband;free weights;body weight theraband;free weights;body weight   Frequency 2 x week 2 x week 2 x week   Intensity / RPE 3 - 6 3 - 6 3 - 6   Repetitions 8 - 10 10 - 12 10 - 12   Sets 2 2 2    Limitations or precautions orthopedics orthopedics orthopedics   Progression Increase resistance amount when patient successfully completes the prescribed number of  sets and reps with RPE below 3 (1-10 scale) Increase resistance amount when patient successfully completes the prescribed number of  sets and reps with RPE below 3 (1-10 scale) Increase resistance amount when patient successfully completes the prescribed number of  sets and reps with RPE below 3 (1-10 scale)       OTHER TRAINING / EXERCISE PLAN:      10/28/2021    10:42 AM 11/21/2021    10:45 AM 12/13/2021     7:00 AM   OTHER TRAINING   Balance Training Yes - Daily dynamic balance exercises in warm up and cool down phase of aerobic exercise session. Yes - Daily dynamic balance exercises in warm up and cool down phase of aerobic exercise session. Yes - Daily dynamic balance exercises in warm up and cool down phase of aerobic exercise session.   Balance Score 3     Flexibility training type static stretching post cardio or resistance training for all major muscle groups x 16 - 30sec duration. static stretching post cardio or resistance training for all major muscle groups x 16 - 30sec duration. static stretching post cardio or resistance training for all major muscle groups x 16 - 30sec duration.   Flexibility R/L Right;Left     Right score -6     Left score -5.5     Lower Body Strength 4     Home Exercise No No No   Other Exercise Comments no formal HEP no formal HEP no formal HEP       EXERCISE EDUCATION CLASSES:      09/24/2021     7:00 AM 10/28/2021     7:00 AM 10/31/2021     7:00 AM 12/05/2021     7:00 AM   EXERCISE EDUCATION   Exercise Education Safe Exercise Parameters Exercise Prescription;Safe Exercise Parameters;Strength Training Other Other   Attend date 09/24/2021 10/28/2021 10/31/2021 12/05/2021   Other comments contraindications to exerise  benefits of cardiac rehab understanidng METS           NUTRITION ASSESSMENT    NUTRITION GOALS:      10/03/2021    11:00 AM 10/28/2021    10:42 AM 10/31/2021    10:45 AM 11/21/2021    10:45 AM 12/13/2021     7:00 AM   NUTRITION GOALS   Patient goal to meet with RD for 1:1 consult to meet with RD for 1:1 consult Goal #1: Achieve a healthy body weight (New)    Goal #2: Attain optimal blood chemistries: non-fasting LDL, fasting lipid profile, fasting glucose and A1C (New)    Goal #3: Establish healthy eating patterns to meet nutrient needs (New) Goal #1: Achieve a healthy body weight (New)    Goal #2: Attain optimal blood chemistries: non-fasting LDL, fasting lipid profile, fasting glucose and A1C (New)    Goal #3: Establish healthy eating patterns to meet nutrient needs (New) Goal #1: Achieve a healthy body weight (New)  Goal #2: Attain optimal blood chemistries: non-fasting LDL, fasting lipid profile, fasting glucose and A1C (New)    Goal #3: Establish healthy eating patterns to meet nutrient needs (New)   Other Goals Patient will lose 1-2 lbs. per week if BMI is greater than 25;Patient will decrease waist circumference Patient will lose 1-2 lbs. per week if BMI is greater than 25;Patient will decrease waist circumference Patient will lose 1-2 lbs. per week if BMI is greater than 25;Patient will improve nutrition assessment score;Patient will decrease waist circumference;Patient will increase daily water intake Patient will lose 1-2 lbs. per week if BMI is greater than 25;Patient will improve nutrition assessment score;Patient will decrease waist circumference;Patient will increase daily water intake Patient will lose 1-2 lbs. per week if BMI is greater than 25;Patient will improve nutrition assessment score;Patient will decrease waist circumference;Patient will increase daily water intake   Progress towards goal In progress;Verbalizes understanding;Other In progress;Verbalizes understanding;Other In progress In progress In progress   Goal progress comment will meet with RD for 1:1 consult will meet with RD for 1:1 consult  has met with RD for 1:1 consult has met with RD for 1:1 consult       NUTRITION INTERVENTIONS:      10/03/2021    11:00 AM 10/28/2021    10:42 AM 10/31/2021    10:45 AM 11/21/2021    10:45 AM 12/13/2021     7:00 AM   NUTRITION INTERVENTIONS   Interventions Attend individual consultation with the RD Attend individual consultation with the RD Educate the patient on healthy weight loss;Monitor weight at each exercise session;Attend individual consultation with the RD;Educate the patient on the importance of proper hydration;Educate the patient on correlation between heart disease and cholesterol status Educate the patient on healthy weight loss;Monitor weight at each exercise session;Attend individual consultation with the RD;Educate the patient on the importance of proper hydration;Educate the patient on correlation between heart disease and cholesterol status Educate the patient on healthy weight loss;Monitor weight at each exercise session;Attend individual consultation with the RD;Educate the patient on the importance of proper hydration;Educate the patient on correlation between heart disease and cholesterol status       NUTRITION ASSESSMENT:      11/27/2021    10:55 AM 11/28/2021    10:45 AM 12/04/2021    10:35 AM 12/05/2021    10:45 AM 12/11/2021    10:33 AM 12/12/2021     7:00 AM 12/13/2021     7:00 AM   NUTRITION ASSESSMENT   Weight 247 lb 246 lb 236 lb 236 lb 246 lb 246 lb    Weight Management       Desires weight loss;Desires dietary guidance   Oz of water daily       48   Other Nutrition Comments       Pt will need further sessions w/ RD.       NUTRITION EDUCATION CLASSES:      10/10/2021     7:00 AM 10/31/2021     7:00 AM   NUTRITION EDUCATION   Nutrition Education Low Sodium;One to One Consult Healthy Eating - AHA Guidelines;One to One Consult   Attend date 10/03/2021 10/31/2021   Other comments referral to RD for 1:1 consult            PSYCHOSOCIAL ASSESSMENT    PSYCHOSOCIAL ASSESSMENT:      10/03/2021    11:00 AM 10/28/2021    10:42 AM 11/21/2021    10:45 AM 12/13/2021  7:00 AM   PSYCHOSOCIAL ASSESSEMENT   Psychosocial Method/Tool PHQ-9 PHQ-9     Psychosocial Score 9 9     Quality of Life Method/Tool Ferrans and powers      Quality of Life Score 16.11      Depression Method/Tool PHQ-9      Depression Score 9      Self-Reported Stress medium medium medium medium   Current State On meds On meds On meds On meds   Other Psychosocial Comments takes trazadone for sleep, initial assessment completed, reports no s/s of depression/anxiety takes trazadone for sleep,  reports no s/s of depression/anxiety takes trazadone for sleep,  reports no s/s of depression/anxiety takes trazadone for sleep,  reports no s/s of depression/anxiety       PSYCHOSOCIAL INTERVENTIONS:      10/03/2021    11:00 AM 10/28/2021    10:42 AM 11/21/2021    10:45 AM 12/13/2021     7:00 AM   PSYCHOSOCIAL INTERVENTIONS   Interventions Attend stress management class;Instruct in relaxation techniques and coping skills Attend stress management class;Instruct in relaxation techniques and coping skills Attend stress management class;Instruct in relaxation techniques and coping skills Attend stress management class;Instruct in relaxation techniques and coping skills       PSYCHOSOCIAL EDUCATION CLASSES:      10/10/2021     7:00 AM 10/31/2021     7:00 AM 11/07/2021     7:00 AM 11/21/2021     7:00 AM 11/28/2021     7:00 AM 12/05/2021     7:00 AM   PSYCHOSOCIAL EDUCATION   Psychosocial Education Stress management;Relaxation Techniques Relaxation Techniques Relaxation Techniques Stress management;Relaxation Techniques Relaxation Techniques Relaxation Techniques   Attend date 10/10/2021 10/31/2021 11/07/2021 11/21/2021 11/28/2021 12/05/2021   Other comments initial assessment 1:1 meditation  mindfulness               OTHER CORE COMPONENTS    BLOOD PRESSURE:      10/03/2021    11:00 AM 10/28/2021    10:42 AM 12/13/2021     7:00 AM   BLOOD PRESSURE   Patient Goal to get to goal of 130/80 to get to goal of 130/80 to get to goal of 130/80   Other Goals Patient will maintain appropriate blood pressure readings less than 130/80 Patient will maintain appropriate blood pressure readings less than 130/80 Patient will maintain appropriate blood pressure readings less than 130/80   Interventions Monitor blood pressure at each session;Educate on importance of taking prescribed blood pressure medication correctly;Educate on importance of regular exercise program;Educate on importance of maintaining appropriate diet and weight Monitor blood pressure at each session;Educate on importance of taking prescribed blood pressure medication correctly;Educate on importance of regular exercise program;Educate on importance of maintaining appropriate diet and weight Monitor blood pressure at each session;Educate on importance of taking prescribed blood pressure medication correctly;Educate on importance of regular exercise program;Educate on importance of maintaining appropriate diet and weight   Progress towards goal In progress;Verbalizes understanding In progress;Verbalizes understanding In progress;Verbalizes understanding   Goal progress comment initial goals set and interventions initiated AEB BP at goal at today's visit AEB BP at goal on most rehab days     Most recent BP: 136/81        DIABETES:      10/03/2021    11:00 AM 10/28/2021    10:42 AM 12/13/2021     7:00 AM   DIABETES   Patient goal to lower HgbA1C to lower HgbA1C to  lower HgbA1C   Other Goals Patient will maintain an acceptable blood sugar range Patient will maintain an acceptable blood sugar range Patient will maintain an acceptable blood sugar range   Interventions Educate patient on compliance with home blood sugar monitoring as prescribed by physician;Educate patient on signs and symptoms of hypo- and hyperglycemia;Instruct patient to monitor blood sugar before and after exercise Educate patient on compliance with home blood sugar monitoring as prescribed by physician;Educate patient on signs and symptoms of hypo- and hyperglycemia;Instruct patient to monitor blood sugar before and after exercise Educate patient on compliance with home blood sugar monitoring as prescribed by physician;Educate patient on signs and symptoms of hypo- and hyperglycemia;Instruct patient to monitor blood sugar before and after exercise   HbA1c 8.3 8.3 8.3   Blood sugar 144 161    Check BS at home? Yes Yes Yes   Progress towards goal In progress;Verbalizes understanding;Notify MD In progress;Verbalizes understanding;Notify MD In progress;Verbalizes understanding;Notify MD   Progress towards goal - Comments message to Dr. Hollice Espy regarding diabetes education referral     Goal progress comment initial goals set and interventions initiated AEB patient will meet with RD for 1:1 consult AEB pt will continue to follow up with RD       HEART FAILURE:      10/03/2021    11:00 AM 10/28/2021    10:42 AM 12/13/2021     7:00 AM   HEART FAILURE   Patient goal at goal on entry at goal on entry at goal on entry   EF % 55 55 55   Progress towards goal Yes Yes Yes   Goal progress comment no dx of HF no dx of HF no dx of HF       MEDICATION COMPREHENSION:      10/03/2021    11:00 AM 10/28/2021    10:42 AM 11/21/2021    10:45 AM 12/13/2021     7:00 AM   MEDICATION COMPREHENSION   Patient goal to decrease need for nitroglycerin to decrease need for nitroglycerin to decrease need for nitroglycerin to decrease need for nitroglycerin   Other Goals Patient will demonstrate knowledge of importance of taking meds as evidenced by verbalizing understanding of class, dosage, frequency and side effects. Patient will demonstrate knowledge of importance of taking meds as evidenced by verbalizing understanding of class, dosage, frequency and side effects. Patient will demonstrate knowledge of importance of taking meds as evidenced by verbalizing understanding of class, dosage, frequency and side effects. Patient will demonstrate knowledge of importance of taking meds as evidenced by verbalizing understanding of class, dosage, frequency and side effects.   Interventions Educate the patient on prescription and importance of time of day taken;Educate the patient on the class of medication, action and side effects of medication;Review dosage and frequency of medication with the patient Educate the patient on prescription and importance of time of day taken;Educate the patient on the class of medication, action and side effects of medication;Review dosage and frequency of medication with the patient Educate the patient on prescription and importance of time of day taken;Educate the patient on the class of medication, action and side effects of medication;Review dosage and frequency of medication with the patient Educate the patient on prescription and importance of time of day taken;Educate the patient on the class of medication, action and side effects of medication;Review dosage and frequency of medication with the patient   Medications Understood Yes Yes Yes Yes   Meds taken % of time 100  100 100 100   Progress towards goal In progress;Verbalizes understanding In progress;Verbalizes understanding In progress;Verbalizes understanding In progress;Verbalizes understanding   Goal progress comment AEB initial goals set and interventions initiated AEB patient starting cardiac rehab AEB patient reports decreased symptoms AEB patient reports decreased symptoms       TOBACCO CESSATION:      10/03/2021    11:00 AM 10/28/2021    10:42 AM 11/21/2021    10:45 AM 12/13/2021     7:00 AM   TOBACCO CESSATION   Patient goal at goal on entry at goal on entry at goal on entry at goal on entry   Progress towards goal Yes Yes Yes Yes   Goal progress comment AEB does not use tobacco products AEB does not use tobacco products AEB does not use tobacco products AEB does not use tobacco products       OTHER CORE COMPONENTS COMMENTS:      10/03/2021    11:00 AM 10/28/2021    10:42 AM 11/21/2021    10:45 AM 12/13/2021     7:00 AM   OTHER COMMENTS   Other comments CTO left main CTO left main  CTO left main   Goal progress comment initial goals set and interventions intiated AEB initiated cardiac rehab AEB progressing workloads and reports no symptoms AEB progressing workloads and reports no symptoms       OTHER CORE COMPONENTS EDUCATION CLASSES:      09/24/2021     7:00 AM 10/10/2021     7:00 AM 11/07/2021     7:00 AM 11/28/2021     7:00 AM   OTHER CORE COMPONENT EDUCATION   Core Component Ed Other Diabetes;Heart Anatomy;Medications;MI and Stoke;Risk Factors Risk factors MI and Stroke   Attend Date 09/24/2021 10/03/2021 11/07/2021 11/28/2021   Other Comments orientation to CR 1:1             Signed:  Pilar Plate, RN  12/13/2021 10:26 PM

## 2021-12-14 NOTE — Unmapped (Signed)
I have reviewed and approved patient's 60 day ITP for continuation in the cardiac rehab program with noted changes to their exercise prescription and therapy goals.     [name], MD, PhD  [facility] Card[facility]

## 2021-12-14 NOTE — Unmapped (Signed)
CARDIAC REHABILITATION   INDIVIDUALIZED TREATMENT PLAN  ITP Phase: 60 day   Date: 12/13/21    Patient Name: Jesus Rubio                                           AACVPR Risk: High Risk  Angina Scale: 4+  Diagnosis:    Diagnosis ICD-10-CM Associated Orders   1. 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  Brief synopsis of  Jesus Rubio progress over the past 30 days:    Reported new cardiac symptoms No.  Incidents at rehab  No.    Exercise: Demonstrates independence on all exercise modalities, warm up and cool down. Requires assistance and 1:1 monitoring for safety/fall prevention     Demonstrates correct use of RPE scales and gauging intensity (understands THRR).  Yes.  Limitations to progression  Yes., orthopedic, chronic pain    Nutrition goal to meet 1:1 with RD and improve heart healthy eating habits on track  will need continued reinforcement  Psychosocial: interacts well with staff and other participants; denies S & S of depression Participates in weekly relaxation classes and demonstrates ability to perform independently.  Yes.  Tobacco status at goal Yes.  Medication compliance at goal Yes.    Card Rehab Vitals         11/25/2021   11/26/2021   11/27/2021   11/28/2021   12/04/2021   12/05/2021   12/11/2021   12/12/2021      BP 145/74 130/78 142/67 142/80 154/79 145/72 128/71 136/81    Weight 111.6 kg (246 lb) 112.1 kg (247 lb 3.2 oz) 112 kg (247 lb) 111.6 kg (246 lb) 107 kg (236 lb) 107 kg (236 lb) 111.6 kg (246 lb) 111.6 kg (246 lb)                          EXERCISE PRESCRIPTION    EXERCISE ASSSESSMENT:      10/03/2021    11:00 AM 10/28/2021    10:42 AM 11/21/2021    10:45 AM 12/13/2021     7:00 AM   EXERCISE PRESCRIPTION   ITP Phase Initial 30 day 60 day 60 day   Special Precautions / Protocols Indicated  Continuous telemetry;Diabetic precautions;Fall precautions;Orthopedic precautions;Other Continuous telemetry;Diabetic precautions;Fall precautions;Orthopedic precautions;Other Continuous telemetry;Diabetic precautions;Fall precautions;Orthopedic precautions;Other   Supplemental O2 Required?  No No No   Type of exercise test  GXT     GXT (MET Level)  1.7           EXERCISE GOALS:      10/10/2021     7:00 AM 10/28/2021    10:42 AM 11/21/2021    10:45 AM 12/13/2021     7:00 AM   EXERCISE GOALS   Patient goal will complete exercise assessment at next visit to increase  stamina and strength to increase stamina and strength to increase stamina and strength   Other Goals  Patient will demonstrate an ability to take their own pulse;Patient will demonstrate an understanding of their exercise prescription;Patient will demonstrate knowledge of safe exercise parameters;Patient will increase the duration and/or intensity of exercise;Increase MET level as appropriate Patient will demonstrate an ability to take their own pulse;Patient will demonstrate an understanding of their exercise prescription;Patient will demonstrate knowledge of safe exercise parameters;Patient will increase the duration and/or intensity of exercise;Increase MET level as appropriate Patient will demonstrate an ability to take their own pulse;Patient will demonstrate an understanding of their exercise prescription;Patient will demonstrate knowledge of safe exercise parameters;Patient will increase the duration and/or intensity of exercise;Increase MET level as appropriate   Goal progress comment  AEB starting cardiac rehab program AEB patient increasing workloads AEB patient increasing workloads and getting on TM   Progress towards goal  In Progress;Verbalizes understanding In Progress;Verbalizes understanding In Progress;Verbalizes understanding       EXERCISE INTERVENTIONS:      10/28/2021    10:42 AM 11/21/2021    10:45 AM 12/13/2021     7:00 AM   EXERCISE INTERVENTIONS   Interventions Educate patient on exercise prescription, THRR, RPE scale, and MET level;Educate patient on normal/abnormal response to exercise;Educate patient on pulse-taking techniques;Orient patient to equipment safety guidelines;Participate in warm-up and cool-down Educate patient on exercise prescription, THRR, RPE scale, and MET level;Educate patient on normal/abnormal response to exercise;Educate patient on pulse-taking techniques;Orient patient to equipment safety guidelines;Participate in warm-up and cool-down Educate patient on exercise prescription, THRR, RPE scale, and MET level;Educate patient on normal/abnormal response to exercise;Educate patient on pulse-taking techniques;Orient patient to equipment safety guidelines;Participate in warm-up and cool-down         AEROBIC CONDITIONING / EXERCISE PLAN:      10/28/2021    10:42 AM 11/21/2021    10:45 AM 12/13/2021     7:00 AM   AEROBIC CONDITIONING / EXERCISE PLAN   Frequency per day 1x per day 1x per day 1x per day   Frequency per week 2xs weekly Other Other   Aerobic minutes per session 31 - 45 31 - 45 31 - 45   RPE/BORG 3 - 6 3 - 6 3 - 6   THRR Low 95 95 95   THRR High 105 105 105   Determined Predicted HR Rest + 20-30 bpm Rest + 20-30 bpm Rest + 20-30 bpm   Rehab Progression Decrease rest interval duration;Gradually increase frequency and duration;Other Decrease rest interval duration;Gradually increase frequency and duration;Other Decrease rest interval duration;Gradually increase frequency and duration;Other   Progression Comments establish aerobic base of 30 minutes then progress workloads by 2% every 2 weeks progress worloads by 2% bi-weekly progress worloads by 2% bi-weekly       AEROBIC CONDITIONING / MODES OF EXERCISE:      10/28/2021    10:42 AM 11/21/2021    10:45 AM 12/13/2021     7:00 AM   AEROBIC CONDITIONING / MODES OF EXERCISE:   Seated Elliptical Yes Yes Yes   Level 1 19 2    Mets 1.7 1.8 2.1   Watts 15 20 30    Treadmill   Yes   Incline   0   Mets   1.8   MPH   1       STRENGTH TRAINING / EXERCISE PLAN:      10/28/2021    10:42 AM 11/21/2021    10:45 AM 12/13/2021  7:00 AM   RESISTANCE TRAINING   Resistance Training Yes Yes Yes   Modes theraband;free weights;body weight theraband;free weights;body weight theraband;free weights;body weight   Frequency 2 x week 2 x week 2 x week   Intensity / RPE 3 - 6 3 - 6 3 - 6   Repetitions 8 - 10 10 - 12 10 - 12   Sets 2 2 2    Limitations or precautions orthopedics orthopedics orthopedics   Progression Increase resistance amount when patient successfully completes the prescribed number of  sets and reps with RPE below 3 (1-10 scale) Increase resistance amount when patient successfully completes the prescribed number of  sets and reps with RPE below 3 (1-10 scale) Increase resistance amount when patient successfully completes the prescribed number of  sets and reps with RPE below 3 (1-10 scale)       OTHER TRAINING / EXERCISE PLAN:      10/28/2021    10:42 AM 11/21/2021    10:45 AM 12/13/2021     7:00 AM   OTHER TRAINING   Balance Training Yes - Daily dynamic balance exercises in warm up and cool down phase of aerobic exercise session. Yes - Daily dynamic balance exercises in warm up and cool down phase of aerobic exercise session. Yes - Daily dynamic balance exercises in warm up and cool down phase of aerobic exercise session.   Balance Score 3     Flexibility training type static stretching post cardio or resistance training for all major muscle groups x 16 - 30sec duration. static stretching post cardio or resistance training for all major muscle groups x 16 - 30sec duration. static stretching post cardio or resistance training for all major muscle groups x 16 - 30sec duration.   Flexibility R/L Right;Left     Right score -6     Left score -5.5     Lower Body Strength 4     Home Exercise No No No   Other Exercise Comments no formal HEP no formal HEP no formal HEP       EXERCISE EDUCATION CLASSES:      09/24/2021     7:00 AM 10/28/2021     7:00 AM 10/31/2021     7:00 AM 12/05/2021     7:00 AM   EXERCISE EDUCATION   Exercise Education Safe Exercise Parameters Exercise Prescription;Safe Exercise Parameters;Strength Training Other Other   Attend date 09/24/2021 10/28/2021 10/31/2021 12/05/2021   Other comments contraindications to exerise  benefits of cardiac rehab understanidng METS           NUTRITION ASSESSMENT    NUTRITION GOALS:      10/03/2021    11:00 AM 10/28/2021    10:42 AM 10/31/2021    10:45 AM 11/21/2021    10:45 AM 12/13/2021     7:00 AM   NUTRITION GOALS   Patient goal to meet with RD for 1:1 consult to meet with RD for 1:1 consult Goal #1: Achieve a healthy body weight (New)    Goal #2: Attain optimal blood chemistries: non-fasting LDL, fasting lipid profile, fasting glucose and A1C (New)    Goal #3: Establish healthy eating patterns to meet nutrient needs (New) Goal #1: Achieve a healthy body weight (New)    Goal #2: Attain optimal blood chemistries: non-fasting LDL, fasting lipid profile, fasting glucose and A1C (New)    Goal #3: Establish healthy eating patterns to meet nutrient needs (New) Goal #1: Achieve a healthy body weight (New)  Goal #2: Attain optimal blood chemistries: non-fasting LDL, fasting lipid profile, fasting glucose and A1C (New)    Goal #3: Establish healthy eating patterns to meet nutrient needs (New)   Other Goals Patient will lose 1-2 lbs. per week if BMI is greater than 25;Patient will decrease waist circumference Patient will lose 1-2 lbs. per week if BMI is greater than 25;Patient will decrease waist circumference Patient will lose 1-2 lbs. per week if BMI is greater than 25;Patient will improve nutrition assessment score;Patient will decrease waist circumference;Patient will increase daily water intake Patient will lose 1-2 lbs. per week if BMI is greater than 25;Patient will improve nutrition assessment score;Patient will decrease waist circumference;Patient will increase daily water intake Patient will lose 1-2 lbs. per week if BMI is greater than 25;Patient will improve nutrition assessment score;Patient will decrease waist circumference;Patient will increase daily water intake   Progress towards goal In progress;Verbalizes understanding;Other In progress;Verbalizes understanding;Other In progress In progress In progress   Goal progress comment will meet with RD for 1:1 consult will meet with RD for 1:1 consult  has met with RD for 1:1 consult has met with RD for 1:1 consult       NUTRITION INTERVENTIONS:      10/03/2021    11:00 AM 10/28/2021    10:42 AM 10/31/2021    10:45 AM 11/21/2021    10:45 AM 12/13/2021     7:00 AM   NUTRITION INTERVENTIONS   Interventions Attend individual consultation with the RD Attend individual consultation with the RD Educate the patient on healthy weight loss;Monitor weight at each exercise session;Attend individual consultation with the RD;Educate the patient on the importance of proper hydration;Educate the patient on correlation between heart disease and cholesterol status Educate the patient on healthy weight loss;Monitor weight at each exercise session;Attend individual consultation with the RD;Educate the patient on the importance of proper hydration;Educate the patient on correlation between heart disease and cholesterol status Educate the patient on healthy weight loss;Monitor weight at each exercise session;Attend individual consultation with the RD;Educate the patient on the importance of proper hydration;Educate the patient on correlation between heart disease and cholesterol status       NUTRITION ASSESSMENT:      11/27/2021    10:55 AM 11/28/2021    10:45 AM 12/04/2021    10:35 AM 12/05/2021    10:45 AM 12/11/2021    10:33 AM 12/12/2021     7:00 AM 12/13/2021     7:00 AM   NUTRITION ASSESSMENT   Weight 247 lb 246 lb 236 lb 236 lb 246 lb 246 lb    Weight Management       Desires weight loss;Desires dietary guidance   Oz of water daily       48   Other Nutrition Comments       Pt will need further sessions w/ RD.       NUTRITION EDUCATION CLASSES:      10/10/2021     7:00 AM 10/31/2021     7:00 AM   NUTRITION EDUCATION   Nutrition Education Low Sodium;One to One Consult Healthy Eating - AHA Guidelines;One to One Consult   Attend date 10/03/2021 10/31/2021   Other comments referral to RD for 1:1 consult            PSYCHOSOCIAL ASSESSMENT    PSYCHOSOCIAL ASSESSMENT:      10/03/2021    11:00 AM 10/28/2021    10:42 AM 11/21/2021    10:45 AM 12/13/2021  7:00 AM   PSYCHOSOCIAL ASSESSEMENT   Psychosocial Method/Tool PHQ-9 PHQ-9     Psychosocial Score 9 9     Quality of Life Method/Tool Ferrans and powers      Quality of Life Score 16.11      Depression Method/Tool PHQ-9      Depression Score 9      Self-Reported Stress medium medium medium medium   Current State On meds On meds On meds On meds   Other Psychosocial Comments takes trazadone for sleep, initial assessment completed, reports no s/s of depression/anxiety takes trazadone for sleep,  reports no s/s of depression/anxiety takes trazadone for sleep,  reports no s/s of depression/anxiety takes trazadone for sleep,  reports no s/s of depression/anxiety       PSYCHOSOCIAL INTERVENTIONS:      10/03/2021    11:00 AM 10/28/2021    10:42 AM 11/21/2021    10:45 AM 12/13/2021     7:00 AM   PSYCHOSOCIAL INTERVENTIONS   Interventions Attend stress management class;Instruct in relaxation techniques and coping skills Attend stress management class;Instruct in relaxation techniques and coping skills Attend stress management class;Instruct in relaxation techniques and coping skills Attend stress management class;Instruct in relaxation techniques and coping skills       PSYCHOSOCIAL EDUCATION CLASSES:      10/10/2021     7:00 AM 10/31/2021     7:00 AM 11/07/2021     7:00 AM 11/21/2021     7:00 AM 11/28/2021     7:00 AM 12/05/2021     7:00 AM   PSYCHOSOCIAL EDUCATION   Psychosocial Education Stress management;Relaxation Techniques Relaxation Techniques Relaxation Techniques Stress management;Relaxation Techniques Relaxation Techniques Relaxation Techniques   Attend date 10/10/2021 10/31/2021 11/07/2021 11/21/2021 11/28/2021 12/05/2021   Other comments initial assessment 1:1 meditation  mindfulness               OTHER CORE COMPONENTS    BLOOD PRESSURE:      10/03/2021    11:00 AM 10/28/2021    10:42 AM 12/13/2021     7:00 AM   BLOOD PRESSURE   Patient Goal to get to goal of 130/80 to get to goal of 130/80 to get to goal of 130/80   Other Goals Patient will maintain appropriate blood pressure readings less than 130/80 Patient will maintain appropriate blood pressure readings less than 130/80 Patient will maintain appropriate blood pressure readings less than 130/80   Interventions Monitor blood pressure at each session;Educate on importance of taking prescribed blood pressure medication correctly;Educate on importance of regular exercise program;Educate on importance of maintaining appropriate diet and weight Monitor blood pressure at each session;Educate on importance of taking prescribed blood pressure medication correctly;Educate on importance of regular exercise program;Educate on importance of maintaining appropriate diet and weight Monitor blood pressure at each session;Educate on importance of taking prescribed blood pressure medication correctly;Educate on importance of regular exercise program;Educate on importance of maintaining appropriate diet and weight   Progress towards goal In progress;Verbalizes understanding In progress;Verbalizes understanding In progress;Verbalizes understanding   Goal progress comment initial goals set and interventions initiated AEB BP at goal at today's visit AEB BP at goal on most rehab days     Most recent BP: 136/81        DIABETES:      10/03/2021    11:00 AM 10/28/2021    10:42 AM 12/13/2021     7:00 AM   DIABETES   Patient goal to lower HgbA1C to lower HgbA1C to  lower HgbA1C   Other Goals Patient will maintain an acceptable blood sugar range Patient will maintain an acceptable blood sugar range Patient will maintain an acceptable blood sugar range   Interventions Educate patient on compliance with home blood sugar monitoring as prescribed by physician;Educate patient on signs and symptoms of hypo- and hyperglycemia;Instruct patient to monitor blood sugar before and after exercise Educate patient on compliance with home blood sugar monitoring as prescribed by physician;Educate patient on signs and symptoms of hypo- and hyperglycemia;Instruct patient to monitor blood sugar before and after exercise Educate patient on compliance with home blood sugar monitoring as prescribed by physician;Educate patient on signs and symptoms of hypo- and hyperglycemia;Instruct patient to monitor blood sugar before and after exercise   HbA1c 8.3 8.3 8.3   Blood sugar 144 161    Check BS at home? Yes Yes Yes   Progress towards goal In progress;Verbalizes understanding;Notify MD In progress;Verbalizes understanding;Notify MD In progress;Verbalizes understanding;Notify MD   Progress towards goal - Comments message to Dr. Hollice Espy regarding diabetes education referral     Goal progress comment initial goals set and interventions initiated AEB patient will meet with RD for 1:1 consult AEB pt will continue to follow up with RD       HEART FAILURE:      10/03/2021    11:00 AM 10/28/2021    10:42 AM 12/13/2021     7:00 AM   HEART FAILURE   Patient goal at goal on entry at goal on entry at goal on entry   EF % 55 55 55   Progress towards goal Yes Yes Yes   Goal progress comment no dx of HF no dx of HF no dx of HF       MEDICATION COMPREHENSION:      10/03/2021    11:00 AM 10/28/2021    10:42 AM 11/21/2021    10:45 AM 12/13/2021     7:00 AM   MEDICATION COMPREHENSION   Patient goal to decrease need for nitroglycerin to decrease need for nitroglycerin to decrease need for nitroglycerin to decrease need for nitroglycerin   Other Goals Patient will demonstrate knowledge of importance of taking meds as evidenced by verbalizing understanding of class, dosage, frequency and side effects. Patient will demonstrate knowledge of importance of taking meds as evidenced by verbalizing understanding of class, dosage, frequency and side effects. Patient will demonstrate knowledge of importance of taking meds as evidenced by verbalizing understanding of class, dosage, frequency and side effects. Patient will demonstrate knowledge of importance of taking meds as evidenced by verbalizing understanding of class, dosage, frequency and side effects.   Interventions Educate the patient on prescription and importance of time of day taken;Educate the patient on the class of medication, action and side effects of medication;Review dosage and frequency of medication with the patient Educate the patient on prescription and importance of time of day taken;Educate the patient on the class of medication, action and side effects of medication;Review dosage and frequency of medication with the patient Educate the patient on prescription and importance of time of day taken;Educate the patient on the class of medication, action and side effects of medication;Review dosage and frequency of medication with the patient Educate the patient on prescription and importance of time of day taken;Educate the patient on the class of medication, action and side effects of medication;Review dosage and frequency of medication with the patient   Medications Understood Yes Yes Yes Yes   Meds taken % of time 100  100 100 100   Progress towards goal In progress;Verbalizes understanding In progress;Verbalizes understanding In progress;Verbalizes understanding In progress;Verbalizes understanding   Goal progress comment AEB initial goals set and interventions initiated AEB patient starting cardiac rehab AEB patient reports decreased symptoms AEB patient reports decreased symptoms       TOBACCO CESSATION:      10/03/2021    11:00 AM 10/28/2021    10:42 AM 11/21/2021    10:45 AM 12/13/2021     7:00 AM   TOBACCO CESSATION   Patient goal at goal on entry at goal on entry at goal on entry at goal on entry   Progress towards goal Yes Yes Yes Yes   Goal progress comment AEB does not use tobacco products AEB does not use tobacco products AEB does not use tobacco products AEB does not use tobacco products       OTHER CORE COMPONENTS COMMENTS:      10/03/2021    11:00 AM 10/28/2021    10:42 AM 11/21/2021    10:45 AM 12/13/2021     7:00 AM   OTHER COMMENTS   Other comments CTO left main CTO left main  CTO left main   Goal progress comment initial goals set and interventions intiated AEB initiated cardiac rehab AEB progressing workloads and reports no symptoms AEB progressing workloads and reports no symptoms       OTHER CORE COMPONENTS EDUCATION CLASSES:      09/24/2021     7:00 AM 10/10/2021     7:00 AM 11/07/2021     7:00 AM 11/28/2021     7:00 AM   OTHER CORE COMPONENT EDUCATION   Core Component Ed Other Diabetes;Heart Anatomy;Medications;MI and Stoke;Risk Factors Risk factors MI and Stroke   Attend Date 09/24/2021 10/03/2021 11/07/2021 11/28/2021   Other Comments orientation to CR 1:1             Signed:  Pilar Plate, RN  12/13/2021 10:26 PM

## 2021-12-16 NOTE — Unmapped (Signed)
Does the patient report any symptoms since last visit ?  no  Does the patient report any medication changes since last visit ? no   Pain assessment completed in pre-exercise flow sheet.  Contraindications identified? no       Patient tolerated 15 minutes of group functional conditioning: balance training and/or strength training exercises with no complaints;  15 minutes of 1:1 individualized education in regard to disease management- patient verbalizes and demonstrates understanding.      Appropriate response to exercise session: yes   Exercise session goals :  Individual exercise goal achieved : yes

## 2021-12-16 NOTE — Unmapped (Signed)
Abstraction Result Flowsheet Data    This patient's last AWV date: [facility] Last Medicare Wellness Visit Date: 12/09/2017  This patients last WCC/CPE date: : Not Found      Reason for Encounter  Reason for Encounter: Outreach  Primary Reason for Outreach: AWV  Text Message: No  MyChart Message: No  Outreach Call Outcome: Declined (Pt is going through therapy and need to get through that.)

## 2021-12-18 NOTE — Unmapped (Signed)
Does the patient report any symptoms since last visit ?  no  Does the patient report any medication changes since last visit ? no   Pain assessment completed in pre-exercise flow sheet.  Contraindications identified? no       Patient tolerated 15 minutes of group functional conditioning: balance training and/or strength training exercises with no complaints;  15 minutes of 1:1 individualized education in regard to disease management- patient verbalizes and demonstrates understanding.      Appropriate response to exercise session: yes   Exercise session goals :  Individual exercise goal achieved : yes

## 2021-12-19 ENCOUNTER — Ambulatory Visit: Admit: 2021-12-19 | Discharge: 2021-12-20 | Payer: MEDICARE | Attending: Family Medicine | Primary: Family Medicine

## 2021-12-19 DIAGNOSIS — I2510 Atherosclerotic heart disease of native coronary artery without angina pectoris: Principal | ICD-10-CM

## 2021-12-19 DIAGNOSIS — G629 Polyneuropathy, unspecified: Principal | ICD-10-CM

## 2021-12-19 DIAGNOSIS — S90222A Contusion of left lesser toe(s) with damage to nail, initial encounter: Principal | ICD-10-CM

## 2021-12-19 DIAGNOSIS — D500 Iron deficiency anemia secondary to blood loss (chronic): Principal | ICD-10-CM

## 2021-12-19 DIAGNOSIS — K219 Gastro-esophageal reflux disease without esophagitis: Principal | ICD-10-CM

## 2021-12-19 DIAGNOSIS — E1142 Type 2 diabetes mellitus with diabetic polyneuropathy: Principal | ICD-10-CM

## 2021-12-19 DIAGNOSIS — M79675 Pain in left toe(s): Principal | ICD-10-CM

## 2021-12-19 LAB — BASIC METABOLIC PANEL
ANION GAP: 13 mmol/L (ref 7–15)
BLOOD UREA NITROGEN: 14 mg/dL (ref 7–21)
BUN / CREAT RATIO: 10
CALCIUM: 9.6 mg/dL (ref 8.5–10.2)
CHLORIDE: 103 mmol/L (ref 98–107)
CO2: 24 mmol/L (ref 22.0–32.0)
CREATININE: 1.4 mg/dL — ABNORMAL HIGH (ref 0.70–1.30)
EGFR CKD-EPI (2021) MALE: 52 mL/min/{1.73_m2} — ABNORMAL LOW (ref >=60)
GLUCOSE RANDOM: 130 mg/dL — ABNORMAL HIGH (ref 74–106)
POTASSIUM: 4.8 mmol/L (ref 3.5–5.0)
SODIUM: 140 mmol/L (ref 135–145)

## 2021-12-19 LAB — CBC W/ AUTO DIFF
BASOPHILS ABSOLUTE COUNT: 0.1 10*9/L (ref 0.0–0.1)
BASOPHILS RELATIVE PERCENT: 1.2 %
EOSINOPHILS ABSOLUTE COUNT: 0.2 10*9/L (ref 0.0–0.5)
EOSINOPHILS RELATIVE PERCENT: 2.6 %
HEMATOCRIT: 33.4 % — ABNORMAL LOW (ref 39.0–48.0)
HEMOGLOBIN: 10.6 g/dL — ABNORMAL LOW (ref 12.9–16.5)
LYMPHOCYTES ABSOLUTE COUNT: 1.6 10*9/L (ref 1.1–3.6)
LYMPHOCYTES RELATIVE PERCENT: 18 %
MEAN CORPUSCULAR HEMOGLOBIN CONC: 31.7 g/dL — ABNORMAL LOW (ref 32.0–36.0)
MEAN CORPUSCULAR HEMOGLOBIN: 24.9 pg — ABNORMAL LOW (ref 25.9–32.4)
MEAN CORPUSCULAR VOLUME: 78.6 fL (ref 77.6–95.7)
MEAN PLATELET VOLUME: 8.5 fL (ref 6.8–10.7)
MONOCYTES ABSOLUTE COUNT: 0.8 10*9/L (ref 0.3–0.8)
MONOCYTES RELATIVE PERCENT: 8.6 %
NEUTROPHILS ABSOLUTE COUNT: 6.1 10*9/L (ref 1.8–7.8)
NEUTROPHILS RELATIVE PERCENT: 69.6 %
NUCLEATED RED BLOOD CELLS: 0 /100{WBCs} (ref <=4)
PLATELET COUNT: 345 10*9/L (ref 150–450)
RED BLOOD CELL COUNT: 4.25 10*12/L — ABNORMAL LOW (ref 4.26–5.60)
RED CELL DISTRIBUTION WIDTH: 16.5 % — ABNORMAL HIGH (ref 12.2–15.2)
WBC ADJUSTED: 8.8 10*9/L (ref 3.6–11.2)

## 2021-12-19 LAB — IRON & TIBC
IRON SATURATION: 5 %
IRON: 26 ug/dL — ABNORMAL LOW (ref 37–170)
TOTAL IRON BINDING CAPACITY: 485 ug/dL — ABNORMAL HIGH (ref 252–479)

## 2021-12-19 MED ORDER — OMEPRAZOLE 20 MG CAPSULE,DELAYED RELEASE
ORAL_CAPSULE | Freq: Two times a day (BID) | ORAL | 1 refills | 90 days | Status: CP
Start: 2021-12-19 — End: 2022-12-19

## 2021-12-19 NOTE — Unmapped (Signed)
Does the patient report any symptoms since last visit ?  no  Does the patient report any medication changes since last visit ? no   Pain assessment completed in pre-exercise flow sheet.  Contraindications identified? no       Patient tolerated 15 minutes of group functional conditioning: balance training and/or strength training exercises with no complaints;  15 minutes of 1:1 individualized education in regard to disease management- patient verbalizes and demonstrates understanding.      Appropriate response to exercise session: yes   Exercise session goals :  Individual exercise goal achieved : yes        Patient attended group or individual education  yes    Family present:no  Learning tools:  Written material : no  Audio / Visual :yes  Hands on / models no  Discussion / lecture: yes    Patient verbalizes understanding and teaching goal met  yes

## 2021-12-19 NOTE — Unmapped (Signed)
Health Maintenance Due   Topic Date Due    COVID-19 Vaccine (1) Never done    Zoster Vaccines (1 of 2) Never done    DTaP/Tdap/Td Vaccines (1 - Tdap) 04/08/2002    Medicare Annual Wellness Visit (AWV)  01/09/2019    Influenza Vaccine (1) 12/06/2021    Retinal Eye Exam  12/13/2021           Goals & Recommendations:    Goals and Recommendations related to your health are outlined below.    If discussed and provided at your visit today: Please, keep and record your blood sugar, blood pressure, weight, fluid intake, dietary intake and exercise amount and frequency.     Blood Pressure Goals (unless otherwise instructed):  Check 2-3 times a week.  Typical average out of office measurement: <135 mmHg systolic and < 85 mmHg diastolic (less than 135/85).  More strict recommendations for out of office measurement: < 130 mmHg systolic and < 80 mmHg diastolic (less than 130/80).  Typical in office measurement <140 mmHg systolic and <90 mmHg diastolic (less than 140/90).  Most people will not feel well if blood pressure drops to less than 110/70, unless they typically have blood pressures in this range.   If you are noticing sudden increases or decreases in blood pressure, then please contact the office.    Diabetes: Goal Hemoglobin A1c 7.9 or lower.  Goal Blood Sugars Fasting: 60-140 (may check fasting and two hours after a meal later in the day).    For Monitoring your Mood your PHQ-9 Goal is less than 10.    Drink 48 ounces of water per day.  Avoid sweetened beverages.  You will generally feel better and experience improved health when you limit, and if possible avoid, processed foods.  Diets high in plant based foods, vegetables and fruits are associated with many health benefits.    Exercise for a minimum of 20 minutes daily. Minimum of four days a week.   If possible try to obtain 60 minutes of exercise daily for five or more days per week.  Regular movement throughout the day is best.   Walking is a good form of movement.   Squats, lunges, step-ups, push-ups, planks, and arm exercises with or without weights or resistance bands are good forms of exercise.    If your BMI (body mass index) is above 25 then your body is carrying more weight than is typically healthy.  If your BMI (body mass index) is above 25 and you are not achieving the healthy weight goals that you desire, then consider the following:  Keep calories below 2500 calories per day unless otherwise instructed. Remember that all calories are not equal.  Keep a food journal and record accurately.  Read labels and ingredient lists on the products that you consume.  Sometimes we need to adjust your level of physical activity, and or tailor an exercise program more specifically for your needs, just ask and we will come up with a plan.  If you have questions about any of these recommendations, please ask.    If you smoke, stop.  If you have heart failure or severe kidney disease: weigh yourself daily and maintain fluid intake to less than 2 Liters daily unless otherwise instructed.  If you have diabetes you need to see the eye doctor at least every one to two years and have a yearly foot exam.  Set aside time daily for relaxation.  Plan a pleasant activity that you can look  forward to and enjoy.  Medication changes are outlined on your After Visit Summary.  Any referrals placed today will be outlined on your After Visit Summary.    If you have questions, please call the office or use MyChart.   If you need a refill on a medication, please contact your pharmacy.  Please, allow at least two business days for responses to refill requests.    Please, bring all medications and when possible original prescription containers to each and every office visit.    If you have been provided and / or requested to record your blood sugars, blood pressures, weight, fluid intake, dietary intake, or exercise, then please bring records to each and every appointment.    When you see other providers for health care, please request that they send information and records related to your visit to our office. This includes vaccinations, mammograms, radiology, procedures, diagnostic testing, lab testing and physical exams or wellness exams that you have outside of the [facility].      [facility]  [address]  [place], [place] [address]  Office: [phone]  Fax: [phone]             Learning About Mindfulness for Stress  What are mindfulness and stress?    Stress is what you feel when you have to handle more than you are used to. A lot of things can cause stress. You may feel stress when you go on a job interview, take a test, or run a race. This kind of short-term stress is normal and even useful. It can help you if you need to work hard or react quickly.  Stress also can last a long time. Long-term stress is caused by stressful situations or events. Examples of long-term stress include long-term health problems, ongoing problems at work, and conflicts in your family. Long-term stress can harm your health.  Mindfulness is a focus only on things happening in the present moment. It's a process of purposefully paying attention to and being aware of your surroundings, your emotions, your thoughts, and how your body feels. You are aware of these things, but you aren't judging these experiences as good or bad. Mindfulness can help you learn to calm your mind and body to help you cope with illness, pain, and stress.  How does mindfulness help to relieve stress?  Mindfulness can help quiet your mind and relax your body. Studies show that it can help some people sleep better, feel less anxious, and bring their blood pressure down. And it's been shown to help some people live and cope better with certain health problems like heart disease, depression, chronic pain, and cancer.  How do you practice mindfulness?  To be mindful is to pay attention, to be present, and to be accepting.  When you're mindful, you do just one thing and you pay close attention to that one thing. For example, you may sit quietly and notice your emotions or how your food tastes and smells.  When you're present, you focus on the things that are happening right now. You let go of your thoughts about the past and the future. When you dwell on the past or the future, you miss moments that can heal and strengthen you. You may miss moments like hearing a child laugh or seeing a friendly face when you think you're all alone.  When you're accepting, you don't judge the present moment. Instead  you accept your thoughts and feelings as they come.  You can practice anytime, anywhere, and in any way you choose. You can practice in many ways. Here are a few ideas:  While doing your chores, like washing the dishes, let your mind focus on what's in your hand. What does the dish feel like? Is the water warm or cold?  Go outside and take a few deep breaths. What is the air like? Is it warm or cold?  When you can, take some time at the start of your day to sit alone and think.  Take a slow walk by yourself. Count your steps while you breathe in and out.  Try yoga breathing exercises, stretches, and poses to strengthen and relax your muscles.  At work, if you can, try to stop for a few moments each hour. Note how your body feels. Let yourself regroup and let your mind settle before you return to what you were doing.  If you struggle with anxiety or worry thoughts, imagine your mind as a blue sky and your worry thoughts as clouds. Now imagine those worry thoughts floating across your mind's sky. Just let them pass by as you watch.  Follow-up care is a key part of your treatment and safety. Be sure to make and go to all appointments, and call your doctor if you are having problems. It's also a good idea to know your test results and keep a list of the medicines you take.  Where can you learn more?  Go to [facility] at [email or web address]  Select Preferences in the upper right hand corner, then select Health Library under Resources. Enter [phone] in the search box to learn more about Learning About Mindfulness for Stress.  Current as of: January 15, 2016  Content Version: 11.6  ?? 2006-2018 [employer]. Care instructions adapted under license by [facility]. If you have questions about a medical condition or this instruction, always ask your healthcare professional. [employer], Inc[employer] disclaims any warranty or liability for your use of this information.         Learning About Guided Imagery for Stress  What are guided imagery and stress?    Stress is what you feel when you have to handle more than you are used to. A lot of things can cause stress. You may feel stress when you go on a job interview, take a test, or run a race. This kind of short-term stress is normal and even useful. It can help you if you need to work hard or react quickly.  Stress also can last a long time. Long-term stress is caused by stressful situations or events. Examples of long-term stress include long-term health problems, ongoing problems at work, and conflicts in your family. Long-term stress can harm your health.  Guided imagery is a technique of directed thoughts and suggestions that guide your mind toward a relaxed, focused state. This technique helps you use your mind to take you to a calm, peaceful place. You can use it to relax, which can lower blood pressure and reduce other problems related to stress.  How does guided imagery help to relieve stress?  Because of the way the mind and body are connected, guided imagery can make you feel like you are experiencing something just by imagining it. You can achieve a relaxed state when you imagine all the details of a safe, comfortable place, such as a beach or a garden. This relaxed state may help you feel more in control  of your emotions and thought processes. Feeling in control may improve your attitude, health, and sense of well-being.  How do you do guided imagery?  You can use a smartphone app or a video to lead you through the process. You use all of your senses in guided imagery. For example, if you want a tropical setting, you can imagine the warm breeze on your skin, the bright blue of the water, the sound of the surf, the sweet scent of tropical flowers, and the taste of coconut. Imagining those things can make you actually feel like you're there.  But you don't have to imagine the tropics to feel peace. Guided imagery can take you to your own restful place. To give guided imagery a try, follow these steps:  Lean back comfortably in your chair. If you can, close your eyes. Put your arms on the armrests, or fold your hands in your lap.  Take a deep breath through your nose. Breathe in slowly, and then let the air out completely through your mouth.  Do it again slowly. Keep breathing like this. Gather up any tension in your body, and send it out with every breath.  Let a feeling of warmth spread from your lungs to your neck and head, down your arms to your fingertips, through your body and into your legs, all the way down to your toes. Stay this way for a moment.  Now imagine a pleasant day. You're at a park or at a place you've visited in the past where you felt at peace.  In your mind's eye, look at what lies in front of you. Maybe you see the sun, filtered through trees. Maybe clouds are drifting by.  Look to one side, and then the other. Notice the feel of the air around you. Notice how it feels on your face and on your arms.  Stay here for a while. Let it become real for you.  Follow-up care is a key part of your treatment and safety. Be sure to make and go to all appointments, and call your doctor if you are having problems. It's also a good idea to know your test results and keep a list of the medicines you take.  Where can you learn more?  Go to [facility] at [email or web address]  Select Preferences in the upper right hand corner, then select Health Library under Resources. Enter N291 in the search box to learn more about Learning About Guided Imagery for Stress.  Current as of: January 15, 2016  Content Version: 11.6  ?? 2006-2018 [employer]. Care instructions adapted under license by [facility]. If you have questions about a medical condition or this instruction, always ask your healthcare professional. [employer] disclaims any warranty or liability for your use of this information.        Learning About the Mediterranean Diet  What is the Mediterranean diet?    The Mediterranean diet is a style of eating rather than a diet plan. It features foods eaten in [place], [place], southern [place], and other countries along the [employer]. It emphasizes eating foods like fish, fruits, vegetables, beans, high-fiber breads and whole grains, nuts, and olive oil. This style of eating includes limited red meat, cheese, and sweets.  Why choose the Mediterranean diet?  A Mediterranean-style diet may improve heart health. It contains more fat than other heart-healthy diets. But the fats are mainly from nuts, unsaturated oils (such as fish oils and olive oil), and certain nut  or seed oils (such as canola, soybean, or flaxseed oil). These fats may help protect the heart and blood vessels.  How can you get started on the Mediterranean diet?  Here are some things you can do to switch to a more Mediterranean way of eating.  What to eat  Eat a variety of fruits and vegetables each day, such as grapes, blueberries, tomatoes, broccoli, peppers, figs, olives, spinach, eggplant, beans, lentils, and chickpeas.  Eat a variety of whole-grain foods each day, such as oats, brown rice, and whole wheat bread, pasta, and couscous.  Eat fish at least 2 times a week. Try tuna, salmon, mackerel, lake trout, herring, or sardines.  Eat moderate amounts of low-fat dairy products, such as milk, cheese, or yogurt.  Eat moderate amounts of poultry and eggs.  Choose healthy (unsaturated) fats, such as nuts, olive oil, and certain nut or seed oils like canola, soybean, and flaxseed.  Limit unhealthy (saturated) fats, such as butter, palm oil, and coconut oil. And limit fats found in animal products, such as meat and dairy products made with whole milk. Try to eat red meat only a few times a month in very small amounts.  Limit sweets and desserts to only a few times a week. This includes sugar-sweetened drinks like soda.  The Mediterranean diet may also include red wine with your meal--1 glass each day for women and up to 2 glasses a day for men.  Tips for eating at home  Use herbs, spices, garlic, lemon zest, and citrus juice instead of salt to add flavor to foods.  Add avocado slices to your sandwich instead of bacon.  Have fish for lunch or dinner instead of red meat. Brush the fish with olive oil, and broil or grill it.  Sprinkle your salad with seeds or nuts instead of cheese.  Cook with olive or canola oil instead of butter or oils that are high in saturated fat.  Switch from 2% milk or whole milk to 1% or fat-free milk.  Dip raw vegetables in a vinaigrette dressing or hummus instead of dips made from mayonnaise or sour cream.  Have a piece of fruit for dessert instead of a piece of cake. Try baked apples, or have some dried fruit.  Tips for eating out  Try broiled, grilled, baked, or poached fish instead of having it fried or breaded.  Ask your server to have your meals prepared with olive oil instead of butter.  Order dishes made with marinara sauce or sauces made from olive oil. Avoid sauces made from cream or mayonnaise.  Choose whole-grain breads, whole wheat pasta and pizza crust, brown rice, beans, and lentils.  Cut back on butter or margarine on bread. Instead, you can dip your bread in a small amount of olive oil.  Ask for a side salad or grilled vegetables instead of french fries or chips.  Where can you learn more?  Go to [facility] at [email or web address]  Select Health Library under the Resources menu. Enter O407 in the search box to learn more about Learning About the Mediterranean Diet.  Current as of: February 11, 2017  Content Version: 12.2  ?? 2006-2019 [employer], [employer]. Care instructions adapted under license by [facility]. If you have questions about a medical condition or this instruction, always ask your healthcare professional. [employer] disclaims any warranty or liability for your use of this information.            Stretching: Exercises  Your Care Instructions  Here are some examples of exercises for stretching. Start each exercise slowly. Ease off the exercise if you start to have pain.  Your doctor or physical therapist will tell you when you can start these exercises and which ones will work best for you.  How to do the exercises  Latissimus stretch    Stand with your back straight and your feet shoulder-width apart. You can do this stretch sitting down if you are not steady on your feet.  Hold your arms above your head, and hold one hand with the other.  Pull upward while leaning straight over toward your right side. Keep your lower body straight. You should feel the stretch along your left side.  Hold 15 to 30 seconds, and then switch sides.  Repeat 2 to 4 times for each side.  Triceps stretch    Stand with your back straight and your feet shoulder-width apart. You can do this stretch sitting down if you are not steady on your feet.  Bring your left elbow straight up while bending your arm.  Grab your left elbow with your right hand, and pull your left elbow toward your head with light pressure. If you are more flexible, you may pull your arm slightly behind your head. You will feel the stretch along the back of your arm.  Hold 15 to 30 seconds, and then switch elbows.  Repeat 2 to 4 times for each arm.  Calf stretch    Place your hands on a wall for balance. You can also do this with your hands on the back of a chair, a countertop, or a tree.  Step back with your left leg. Keep the leg straight, and press your left heel into the floor.  Press your hips forward, bending your right leg slightly. You will feel the stretch in your left calf.  Hold the stretch 15 to 30 seconds.  Repeat 2 to 4 times for each leg.  Quadriceps stretch    Lie on your side with one hand supporting your head.  Bend your upper leg back and grab your ankle with your other hand.  Stretch your leg back by pulling your foot toward your buttocks. You will feel the stretch in the front of your thigh. If this causes stress on your knees, do not do this stretch.  Hold the stretch 15 to 30 seconds.  Repeat 2 to 4 times for each leg.  Groin stretch    Sit on the floor and put the soles of your feet together. Do not slump your back.  Grab your ankles and gently pull your legs toward you.  Press your knees toward the floor. You will feel the stretch in your inner thighs.  Hold 15 to 30 seconds.  Repeat 2 to 4 times.  Hamstring stretch in doorway    Lie on the floor near a doorway, with your buttocks close to the wall.  Let the leg you are not stretching extend through the doorway.  Put the leg you want to stretch up on the wall, and straighten your knee to feel a gentle stretch at the back of your leg.  Hold the stretch for at least 15 to 30 seconds. Repeat 2 to 4 times.  Follow-up care is a key part of your treatment and safety. Be sure to make and go to all appointments, and call your doctor if you are having problems. It's also a good idea to know your test results and keep a list of  the medicines you take.  Where can you learn more?  Go to [facility] at [email or web address]  Select Preferences in the upper right hand corner, then select Health Library under Resources. Enter P733 in the search box to learn more about Stretching: Exercises.  Current as of: March 13, 2016  Content Version: 11.6  ?? 2006-2018 [employer]. Care instructions adapted under license by [facility]. If you have questions about a medical condition or this instruction, always ask your healthcare professional. [employer] disclaims any warranty or liability for your use of this information.

## 2021-12-20 LAB — FERRITIN: FERRITIN: 7.8 ng/mL — ABNORMAL LOW

## 2021-12-22 NOTE — Unmapped (Signed)
Assessment/Plan     Diagnoses and all orders for this visit:    Type 2 diabetes mellitus with diabetic polyneuropathy, without long-term current use of insulin (CMS-HCC)  Stable overall. Well controlled overall. Continue current regimen.  -     HM DIABETES FOOT EXAM    Pain of toe of left foot, Contusion of toenail of left foot, initial encounter  Standard education and anticipatory guidance regarding goals, objectives, and sequelae.  Declines x-ray.  Continue to monitor.  If discoloration of left little toenail does not resolve or progress as expected for a bruise, then recommend dermatology for consideration of biopsy.    Iron deficiency anemia due to chronic blood loss  Noted on recent lab review.  Clinically asymptomatic. No evidence of ongoing blood loss.  -     CBC w/ Differential; Future  -     Iron & TIBC; Future  -     Ferritin; Future  -     Basic Metabolic Panel; Future    Coronary artery disease involving native coronary artery of native heart, unspecified whether angina present  Clinically asymptomatic.   Standard education and anticipatory guidance regarding goals, objectives, and sequelae.  Continue current management and plan of care.  Continue aggressive risk factor reduction.    Peripheral polyneuropathy  Currently clinically stable.  Continue current plan of care.    Gastroesophageal reflux disease, unspecified whether esophagitis present  Stable overall. Well controlled overall. Continue current regimen.  -     omeprazole (PRILOSEC) 20 MG capsule; Take 1 capsule (20 mg total) by mouth Two (2) times a day.    Disposition: Return in about 4 weeks (around 01/16/2022) for Recheck, Anemia.  01/16/2022      Diagnoses and plan along with any newly prescribed medication(s), recommendations, orders and therapies were discussed in detail with patient today.   Standard education and anticipatory guidance regarding goals, objectives and potential sequelae were reviewed. Patient voiced appropriate understanding of assessment and plan of care.    Pertinent handouts were given today and reviewed with the patient as indicated.  The Care Plan and Self-Management goals have been included on the AVS and the AVS has been printed.  Where helpful for monitoring and management I have encouraged the patient to keep regular logs for me to review at their next visit. Any outside resources or referrals needed at this time are noted above. No additional referrals necessary at this time. Patient voiced understanding and all questions have been answered to satisfaction.     To complete this documentation in a timely manner, portions of this encounter may have been completed utilizing Dragon voice recognition software. Please take this into consideration in noting nuances of documentation, grammar, punctuation, and wording. If and when a question exists, or for clarification, please contact the author.    Barron Alvine, MD  [facility] at [facility]  [address]  [place], [name] [address]  Phone [phone]    Subjective:     XBM:[id number] [name], MD    Jemarcus Meaker, is a 76 y.o. male    Chief Complaint   Patient presents with    Diabetes     Patient states that his toe on left foot is purple. Patient states that blood sugar has been good     Coronary Artery Disease     Patient denies having any weakness     Peripheral Neuropathy     Patient states that his feet is always  tingling and numb        HPI:    Patient presents: with wife.    Nursing notes and history reviewed with patient.    Diabetes mellitus type 2 with polyneuropathy.  Blood sugars most recently stable.  No specific hyper or hyperglycemic symptoms.  Goal A1c less than 8.    Pain in the toe of the left foot.  Patient to his left toe but not sure.  There is discoloration under the nail.  Less painful than it had been.  Declines x-ray.    Recent lab review demonstrated anemia consistent with iron deficiency.  No evidence of ongoing blood loss.  No abdominal discomfort.  Unable to find prior colonoscopy results of notes indicate on prior EGD there was some gastritis on EGD and capsule endoscopy, but nothing of concern colonoscopy.    Coronary artery disease.  No chest pain shortness of breath orthopnea PND palpitations or dizziness.    Peripheral polyneuropathy.  Most recently stable.    Esophageal reflux disease.  Doing well with omeprazole. Doing well with proton pump inhibitor. Notices significant benefit when using. Symptomatic when not using.     No other complaints.  No additional recent health care visits elsewhere. No urgent care or ED visits.  No additional palliative, provocative, modifying, quantifying, qualifying, or other related factors.  Unless otherwise outlined above patient is pleased with current medication regimen, admits compliance and denies side effects of medication.    The following portions of the patient's history were reviewed and updated as appropriate: allergies, current medications, past family history, past medical history, past social history, past surgical history and problem list.    ROS: Pertinent positives and negatives are as outlined in the HPI above. The remaining balance of the 12 point review of systems is otherwise benign.      PCMH Components:      Goals         <130/80       Lack of exercise or physical activity    Increase exercise or physical activity        Get heart rate stabilized (pt-stated)         Things to think about to help me reach my goal:     What are you going to do? Take medications as prescribed, Keep positive thinking, Pace activities, Stay hydrated   How and how much? Daily   How frequent? Daily   Barriers to success?    Solutions to barriers?      06/04/17: Patient continues to work on this goal   09/29/17 Patient continues to work on this goal   12-09-2017: Reports he continues to work on this and is staying positive.        Take actions to prevent falling       12-03-2016: New goal: Will change positions slowly and pay attention to pathways. Uses cane or walker at times.   09/29/17 Patient continues to work on this goal   12-09-2017: Reports 1 fall 6-8 months ago when he missed a step and fell. Denies injury. Uses cane occasionally.              Family characteristics, patient's social characteristics, patient's cultural characteristics, patient's communication needs, patient's health literacy and behaviors affecting patient's health are outlined under the patient's History Section, Longitudinal Care Plan or Problem List in Epic or as pertinent in History of Present Illness.    Barriers to goals identified and addressed.   See Assessment and  Plan for additional pertinent details.    Objective:     Physical Exam   BP 134/78  - Pulse 71  - Temp 37.5 ??C (99.5 ??F) (Oral)  - Resp 16  - Ht 182.9 cm (6' 0.01)  - Wt (!) 112.7 kg (248 lb 8 oz)  - SpO2 98%  - BMI 33.70 kg/m??   Constitutional: Oriented to person, place, and time. Appears well-developed and well-nourished. No distress.   Head: Normocephalic and atraumatic.   Eyes: Conjunctivae are normal. No scleral icterus.   Neck: Neck supple. Normal range of motion. No JVD present. No thyromegaly present. No concerning neck mass.   Pulmonary/Chest: Effort normal. No respiratory distress.   Musculoskeletal: Normal range of motion. No edema of the extremities.  Diabetic Foot Exam:  No prior amputations.  Diabetic Circulation Bilateral.  1+ Dorsalis Pedis & Posterior Tibial Bilateral.  Callous Medial Great Toes Bilateral.  There are changes beneath the nail of the left little toe consistent with ecchymosis most likely.    Decreased sensation throughout.  No ulcers.  Lymphadenopathy: There is no lymphadenopathy about the head or neck.  Neurological: Alert and oriented to person, place, and time. No cranial nerve deficit. Normal muscle tone. Normal coordination.  Skin: Skin is warm and dry. No pallor. No rash noted.   Psychiatric: Normal mood, affect and behavior. Judgment and thought content normal.

## 2021-12-23 ENCOUNTER — Ambulatory Visit: Admit: 2021-12-23 | Payer: MEDICARE

## 2021-12-23 ENCOUNTER — Institutional Professional Consult (permissible substitution): Admit: 2021-12-23 | Payer: MEDICARE

## 2021-12-23 ENCOUNTER — Ambulatory Visit: Admit: 2021-12-23 | Discharge: 2022-01-21 | Payer: MEDICARE

## 2021-12-23 ENCOUNTER — Institutional Professional Consult (permissible substitution): Admit: 2021-12-23 | Discharge: 2022-01-21 | Payer: MEDICARE

## 2021-12-23 NOTE — Unmapped (Signed)
Does the patient report any symptoms since last visit ?  no  Does the patient report any medication changes since last visit ? no   Pain assessment completed in pre-exercise flow sheet.  Contraindications identified? no       Patient tolerated 15 minutes of group functional conditioning: balance training and/or strength training exercises with no complaints;  15 minutes of 1:1 individualized education in regard to disease management- patient verbalizes and demonstrates understanding.      Appropriate response to exercise session: yes   Exercise session goals :  Individual exercise goal achieved : yes        Patient attended group or individual education  no

## 2021-12-25 NOTE — Unmapped (Signed)
-----   Message from Harrold Donath, RN sent at 11/13/2021  4:22 PM EDT -----  Regarding: Diabetes F/U  Jesus Rubio and his wife attended diabetes education follow up visit.  I wasn't able to see his A1C result until after he had already left but noticed that it has improved from 8.3% to 6.8%!  His wife has diabetes also, so they are trying to work on improving nutrition and meal planning together. They have been trying to add more lean proteins and cut back on some of the higher carbohydrate foods.  We continued to work on nutrition and meal planning today with the food models to help show appropriate portion control.    He has been checking his blood sugar twice a day and his logs are scanned into the epic media files. You may have looked at them today but FBS has ranged 129-174 and 2 hours after supper 120-319.  He was concerned about some of his higher blood sugar levels after supper.  I did discuss with him the possibility of adding another Glipizide 5mg  before his supper but told him this needs to be discussed with you at his next visit.  His wife also asked about Metformin because it has improved her blood sugars and she has been able to lose some weight.  He reports that he has taken Metformin in the past but it was discontinued because he thought it was upsetting his stomach.  He wanted me to tell you that he would be willing to try Metformin again if you thought it would be helpful. Again, I instructed him to discuss this with you at his next visit.  If encouraged him to focus on improving his nutrition and limiting portion sizes of carbohydrates/starchy foods.  They requested to follow up again in 3 months.  Thanks,  deanna

## 2021-12-27 NOTE — Unmapped (Signed)
[facility] Refill Coordination Note    Specialty Medication(s) to be Shipped:   General Specialty: Repatha    Other medication(s) to be shipped: sharps kit       Jesus Rubio, DOB: March 13, 1946  Phone: [phone] (home)       All above HIPAA information was verified with patient's family member, wife.     Was a Nurse, learning disability used for this call? No    Completed refill call assessment today to schedule patient's medication shipment from the [facility] [phone]).  All relevant notes have been reviewed.     Specialty medication(s) and dose(s) confirmed: Regimen is correct and unchanged.   Changes to medications: [name] reports no changes at this time.  Changes to insurance: No  New side effects reported not previously addressed with a pharmacist or physician: None reported  Questions for the pharmacist: No    Confirmed patient received a Conservation officer, historic buildings and a Surveyor, mining with first shipment. The patient will receive a drug information handout for each medication shipped and additional FDA Medication Guides as required.       DISEASE/MEDICATION-SPECIFIC INFORMATION        For patients on injectable medications: Patient currently has 0 doses left.  Next injection is scheduled for 9/30.    SPECIALTY MEDICATION ADHERENCE     Medication Adherence    Patient reported X missed doses in the last month: 0  Specialty Medication: repatha sureclick 140mg /ml                          Were doses missed due to medication being on hold? No    repatha sureclick 140mg /ml  : 0 days of medicine on hand       REFERRAL TO PHARMACIST     Referral to the pharmacist: Not needed      [facility]     Shipping address confirmed in Epic.     Delivery Scheduled: Yes, Expected medication delivery date: 9/26.     Medication will be delivered via Same Day Courier to the prescription address in Epic WAM.    Westley Gambles   [facility] Specialty Technician

## 2021-12-30 ENCOUNTER — Institutional Professional Consult (permissible substitution): Admit: 2021-12-30 | Discharge: 2021-12-31 | Payer: MEDICARE | Attending: Family Medicine | Primary: Family Medicine

## 2021-12-30 DIAGNOSIS — J069 Acute upper respiratory infection, unspecified: Principal | ICD-10-CM

## 2021-12-30 DIAGNOSIS — U071 COVID-19: Principal | ICD-10-CM

## 2021-12-30 MED ORDER — MOLNUPIRAVIR 200 MG CAPSULE (EUA)
ORAL_CAPSULE | Freq: Two times a day (BID) | ORAL | 0 refills | 5 days | Status: CP
Start: 2021-12-30 — End: 2022-01-04

## 2021-12-30 NOTE — Unmapped (Signed)
-----   Message from [place], [place] sent at 12/30/2021  4:28 PM EDT -----  Regarding: results  Patient would like results from covid test

## 2021-12-30 NOTE — Unmapped (Signed)
Testing positive for COVID-19.  Called patient at listed number and unable to reach.  Prescription for molnupiravir sent to local listed pharmacy to treat COVID-19.  Given use of Eliquis, Paxlovid is contraindicated.  Given overall risk to benefit ratio, molnupiravir is next recommended option for treatment.    Barron Alvine, MD  [facility] Care at [facility]  [address]  [place], [name] [address]  Phone [phone]

## 2021-12-30 NOTE — Unmapped (Signed)
Order placed for COVID, influenza and RSV.    Barron Alvine, MD  [facility] Primary Care at [facility]  [address]  [place], [name] [address]  Phone [phone]

## 2021-12-30 NOTE — Unmapped (Signed)
error 

## 2021-12-30 NOTE — Unmapped (Signed)
-----   Message from [place], [place] sent at 12/30/2021 11:30 AM EDT -----  Regarding: Flu like symptoms  Patient states that  he was sent up here from PT due to having flu/covid symptoms. Patient states he was told he needed to be tested for flu/rsv and covid to return to PT

## 2021-12-31 MED FILL — REPATHA SURECLICK 140 MG/ML SUBCUTANEOUS PEN INJECTOR: SUBCUTANEOUS | 28 days supply | Qty: 2 | Fill #6

## 2021-12-31 MED FILL — EMPTY CONTAINER: 120 days supply | Qty: 1 | Fill #1

## 2021-12-31 NOTE — Unmapped (Signed)
Testing positive for COVID-19.  Prescription for molnupiravir sent to local listed pharmacy to treat COVID-19.  Given use of Eliquis, Paxlovid is contraindicated.  Given overall risk to benefit ratio, molnupiravir is next recommended option for treatment.

## 2021-12-31 NOTE — Unmapped (Signed)
Sent patient a message via mychart. Patient verbalized understanding

## 2022-01-01 NOTE — Unmapped (Signed)
Patient aware of results via mychart 

## 2022-01-01 NOTE — Unmapped (Signed)
-----   Message from [name], MD sent at 12/30/2021  8:06 PM EDT -----  Testing positive for COVID-19.  Prescription for molnupiravir sent to local listed pharmacy to treat COVID-19.  Given use of Eliquis, Paxlovid is contraindicated.  Given overall risk to benefit ratio, molnupiravir is next recommended option for treatment.

## 2022-01-01 NOTE — Unmapped (Unsigned)
Patients exercise Rx and individual treatment plan, including nutrition and psychosocial components, were reviewed at Multidisciplinary Cardiac/Pulmonary Rehab Staff meeting.    Recommendations?  No

## 2022-01-06 NOTE — Unmapped (Signed)
Does the patient report any symptoms since last visit ?  no  Does the patient report any medication changes since last visit ? no   Pain assessment completed in pre-exercise flow sheet.  Contraindications identified? no       Patient tolerated 15 minutes of group functional conditioning: balance training and/or strength training exercises with no complaints;  15 minutes of 1:1 individualized education in regard to disease management- patient verbalizes and demonstrates understanding.      Appropriate response to exercise session: yes   Exercise session goals :  Individual exercise goal achieved : yes        Patient attended group or individual education  no

## 2022-01-08 NOTE — Unmapped (Signed)
Does the patient report any symptoms since last visit ?  no  Does the patient report any medication changes since last visit ? no   Pain assessment completed in pre-exercise flow sheet.  Contraindications identified? no       Patient tolerated 15 minutes of group functional conditioning: balance training and/or strength training exercises with no complaints;  15 minutes of 1:1 individualized education in regard to disease management- patient verbalizes and demonstrates understanding.      Appropriate response to exercise session: yes   Exercise session goals :  Individual exercise goal achieved : yes

## 2022-01-09 NOTE — Unmapped (Signed)
CARDIAC REHABILITATION   INDIVIDUALIZED TREATMENT PLAN      Date: 01/09/2022    Patient Name: Jesus Rubio                                                 Diagnosis:    Diagnosis ICD-10-CM Associated Orders   1. 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  Brief synopsis of  Jesus Rubio progress over the past 30 days:    Reported new cardiac symptoms No.  Incidents at rehab  No.    Exercise: Demonstrates independence on all exercise modalities, warm up and cool down.Yes.    Demonstrates correct use of RPE scales and gauging intensity (understands THRR).  Yes.  Limitations to progression  Yes.    Nutrition goal to meet 1:1 with RD and improve heart healthy eating habits on track Yes.  Psychosocial: interacts well with staff and other participants; denies S & S of depression Participates in weekly relaxation classes and demonstrates ability to perform independently.  Yes.  Tobacco status at goal. Yes  Medication compliance at goal Yes.    Card Rehab Vitals         12/16/2021   12/18/2021   9/14/202310:36 AM 12/19/2021 3:27 PM 12/23/2021   01/06/2022   01/08/2022   01/09/2022      BP 131/76 156/75 129/75 134/78 141/82 137/71 145/64 151/65    Weight 111.6 kg (246 lb) 112 kg (247 lb) 112 kg (247 lb) 112.7 kg (248 lb 8 oz) 112.5 kg (248 lb) 111.1 kg (245 lb) 108.9 kg (240 lb) 108.9 kg (240 lb)                          EXERCISE PRESCRIPTION    EXERCISE ASSSESSMENT:      10/03/2021    11:00 AM 10/28/2021    10:42 AM 11/21/2021    10:45 AM 12/13/2021     7:00 AM 01/08/2022    10:45 AM   EXERCISE PRESCRIPTION   ITP Phase Initial 30 day 60 day 60 day 90 days   Special Precautions / Protocols Indicated  Continuous telemetry;Diabetic precautions;Fall precautions;Orthopedic precautions;Other Continuous telemetry;Diabetic precautions;Fall precautions;Orthopedic precautions;Other Continuous telemetry;Diabetic precautions;Fall precautions;Orthopedic precautions;Other Continuous telemetry;Diabetic precautions;Fall precautions;Orthopedic precautions;Other   Supplemental O2 Required?  No No No No   Type of exercise test  GXT      GXT (MET Level)  1.7            EXERCISE GOALS:      10/10/2021     7:00 AM 10/28/2021    10:42 AM 11/21/2021    10:45 AM 12/13/2021     7:00 AM 01/08/2022  10:45 AM   EXERCISE GOALS   Patient goal will complete exercise assessment at next visit to increase stamina and strength to increase stamina and strength to increase stamina and strength to increase stamina and strength   Other Goals  Patient will demonstrate an ability to take their own pulse;Patient will demonstrate an understanding of their exercise prescription;Patient will demonstrate knowledge of safe exercise parameters;Patient will increase the duration and/or intensity of exercise;Increase MET level as appropriate Patient will demonstrate an ability to take their own pulse;Patient will demonstrate an understanding of their exercise prescription;Patient will demonstrate knowledge of safe exercise parameters;Patient will increase the duration and/or intensity of exercise;Increase MET level as appropriate Patient will demonstrate an ability to take their own pulse;Patient will demonstrate an understanding of their exercise prescription;Patient will demonstrate knowledge of safe exercise parameters;Patient will increase the duration and/or intensity of exercise;Increase MET level as appropriate Patient will demonstrate an ability to take their own pulse;Patient will demonstrate an understanding of their exercise prescription;Patient will demonstrate knowledge of safe exercise parameters;Patient will increase the duration and/or intensity of exercise;Increase MET level as appropriate   Goal progress comment  AEB starting cardiac rehab program AEB patient increasing workloads AEB patient increasing workloads and getting on TM AEB patient increasing workloads and getting on TM   Progress towards goal  In Progress;Verbalizes understanding In Progress;Verbalizes understanding In Progress;Verbalizes understanding In Progress;Verbalizes understanding       EXERCISE INTERVENTIONS:      10/28/2021    10:42 AM 11/21/2021    10:45 AM 12/13/2021     7:00 AM 01/08/2022    10:45 AM   EXERCISE INTERVENTIONS   Interventions Educate patient on exercise prescription, THRR, RPE scale, and MET level;Educate patient on normal/abnormal response to exercise;Educate patient on pulse-taking techniques;Orient patient to equipment safety guidelines;Participate in warm-up and cool-down Educate patient on exercise prescription, THRR, RPE scale, and MET level;Educate patient on normal/abnormal response to exercise;Educate patient on pulse-taking techniques;Orient patient to equipment safety guidelines;Participate in warm-up and cool-down Educate patient on exercise prescription, THRR, RPE scale, and MET level;Educate patient on normal/abnormal response to exercise;Educate patient on pulse-taking techniques;Orient patient to equipment safety guidelines;Participate in warm-up and cool-down Educate patient on exercise prescription, THRR, RPE scale, and MET level;Educate patient on normal/abnormal response to exercise;Educate patient on pulse-taking techniques;Orient patient to equipment safety guidelines;Participate in warm-up and cool-down         AEROBIC CONDITIONING / EXERCISE PLAN:      10/28/2021    10:42 AM 11/21/2021 10:45 AM 12/13/2021     7:00 AM 01/08/2022    10:45 AM   AEROBIC CONDITIONING / EXERCISE PLAN   Frequency per day 1x per day 1x per day 1x per day 1x per day   Frequency per week 2xs weekly Other Other Other   Aerobic minutes per session 31 - 45 31 - 45 31 - 45 31 - 45   RPE/BORG 3 - 6 3 - 6 3 - 6 3 - 6   THRR Low 95 95 95 95   THRR High 105 105 105 105   Determined Predicted HR Rest + 20-30 bpm Rest + 20-30 bpm Rest + 20-30 bpm Rest + 20-30 bpm   Rehab Progression Decrease rest interval duration;Gradually increase frequency and duration;Other Decrease rest interval duration;Gradually increase frequency and duration;Other Decrease rest interval duration;Gradually increase frequency and duration;Other Decrease rest interval duration;Gradually increase frequency and duration;Other   Progression Comments establish aerobic base of 30 minutes then progress workloads by 2% every  2 weeks progress worloads by 2% bi-weekly progress worloads by 2% bi-weekly progress worloads by 2% bi-weekly       AEROBIC CONDITIONING / MODES OF EXERCISE:      10/28/2021    10:42 AM 11/21/2021    10:45 AM 12/13/2021     7:00 AM 01/08/2022    10:45 AM   AEROBIC CONDITIONING / MODES OF EXERCISE:   Seated Elliptical Yes Yes Yes Yes   Level 1 19 2 2    Mets 1.7 1.8 2.1 2.1   Watts 15 20 30 30    Treadmill   Yes Yes   Incline   0 0   Mets   1.8 1.8   MPH   1 1       STRENGTH TRAINING / EXERCISE PLAN:      10/28/2021    10:42 AM 11/21/2021    10:45 AM 12/13/2021     7:00 AM 01/08/2022    10:45 AM   RESISTANCE TRAINING   Resistance Training Yes Yes Yes Yes   Modes theraband;free weights;body weight theraband;free weights;body weight theraband;free weights;body weight theraband;free weights;body weight   Frequency 2 x week 2 x week 2 x week 2 x week   Intensity / RPE 3 - 6 3 - 6 3 - 6 3 - 6   Repetitions 8 - 10 10 - 12 10 - 12 10 - 12   Sets 2 2 2 2    Limitations or precautions orthopedics orthopedics orthopedics orthopedics   Progression Increase resistance amount when patient successfully completes the prescribed number of  sets and reps with RPE below 3 (1-10 scale) Increase resistance amount when patient successfully completes the prescribed number of  sets and reps with RPE below 3 (1-10 scale) Increase resistance amount when patient successfully completes the prescribed number of  sets and reps with RPE below 3 (1-10 scale) Increase resistance amount when patient successfully completes the prescribed number of  sets and reps with RPE below 3 (1-10 scale)       OTHER TRAINING / EXERCISE PLAN:      10/28/2021    10:42 AM 11/21/2021    10:45 AM 12/13/2021     7:00 AM 01/08/2022    10:45 AM   OTHER TRAINING   Balance Training Yes - Daily dynamic balance exercises in warm up and cool down phase of aerobic exercise session. Yes - Daily dynamic balance exercises in warm up and cool down phase of aerobic exercise session. Yes - Daily dynamic balance exercises in warm up and cool down phase of aerobic exercise session. Yes - Daily dynamic balance exercises in warm up and cool down phase of aerobic exercise session.   Balance Score 3      Flexibility training type static stretching post cardio or resistance training for all major muscle groups x 16 - 30sec duration. static stretching post cardio or resistance training for all major muscle groups x 16 - 30sec duration. static stretching post cardio or resistance training for all major muscle groups x 16 - 30sec duration. static stretching post cardio or resistance training for all major muscle groups x 16 - 30sec duration.   Flexibility R/L Right;Left      Right score -6      Left score -5.5      Lower Body Strength 4      Home Exercise No No No No   Other Exercise Comments no formal HEP no formal HEP no formal HEP no formal HEP  EXERCISE EDUCATION CLASSES:      09/24/2021     7:00 AM 10/28/2021     7:00 AM 10/31/2021     7:00 AM 12/05/2021     7:00 AM   EXERCISE EDUCATION   Exercise Education Safe Exercise Parameters Exercise Prescription;Safe Exercise Parameters;Strength Training Other Other   Attend date 09/24/2021 10/28/2021 10/31/2021 12/05/2021   Other comments contraindications to exerise  benefits of cardiac rehab understanidng METS           NUTRITION ASSESSMENT    NUTRITION GOALS:      10/03/2021    11:00 AM 10/28/2021    10:42 AM 10/31/2021    10:45 AM 11/21/2021    10:45 AM 12/13/2021     7:00 AM 01/08/2022    10:45 AM   NUTRITION GOALS   Patient goal to meet with RD for 1:1 consult to meet with RD for 1:1 consult Goal #1: Achieve a healthy body weight (New)    Goal #2: Attain optimal blood chemistries: non-fasting LDL, fasting lipid profile, fasting glucose and A1C (New)    Goal #3: Establish healthy eating patterns to meet nutrient needs (New) Goal #1: Achieve a healthy body weight (New)    Goal #2: Attain optimal blood chemistries: non-fasting LDL, fasting lipid profile, fasting glucose and A1C (New)    Goal #3: Establish healthy eating patterns to meet nutrient needs (New) Goal #1: Achieve a healthy body weight (New)    Goal #2: Attain optimal blood chemistries: non-fasting LDL, fasting lipid profile, fasting glucose and A1C (New)    Goal #3: Establish healthy eating patterns to meet nutrient needs (New) Goal #1: Achieve a healthy body weight (New)    Goal #2: Attain optimal blood chemistries: non-fasting LDL, fasting lipid profile, fasting glucose and A1C (New)    Goal #3: Establish healthy eating patterns to meet nutrient needs (New)   Other Goals Patient will lose 1-2 lbs. per week if BMI is greater than 25;Patient will decrease waist circumference Patient will lose 1-2 lbs. per week if BMI is greater than 25;Patient will decrease waist circumference Patient will lose 1-2 lbs. per week if BMI is greater than 25;Patient will improve nutrition assessment score;Patient will decrease waist circumference;Patient will increase daily water intake Patient will lose 1-2 lbs. per week if BMI is greater than 25;Patient will improve nutrition assessment score;Patient will decrease waist circumference;Patient will increase daily water intake Patient will lose 1-2 lbs. per week if BMI is greater than 25;Patient will improve nutrition assessment score;Patient will decrease waist circumference;Patient will increase daily water intake Patient will lose 1-2 lbs. per week if BMI is greater than 25;Patient will improve nutrition assessment score;Patient will decrease waist circumference;Patient will increase daily water intake   Progress towards goal In progress;Verbalizes understanding;Other In progress;Verbalizes understanding;Other In progress In progress In progress In progress   Goal progress comment will meet with RD for 1:1 consult will meet with RD for 1:1 consult  has met with RD for 1:1 consult has met with RD for 1:1 consult has met with RD for 1:1 consult       NUTRITION INTERVENTIONS:      10/03/2021    11:00 AM 10/28/2021    10:42 AM 10/31/2021    10:45 AM 11/21/2021    10:45 AM 12/13/2021     7:00 AM 01/08/2022    10:45 AM   NUTRITION INTERVENTIONS   Interventions Attend individual consultation with the RD Attend individual consultation with the RD Educate the patient on  healthy weight loss;Monitor weight at each exercise session;Attend individual consultation with the RD;Educate the patient on the importance of proper hydration;Educate the patient on correlation between heart disease and cholesterol status Educate the patient on healthy weight loss;Monitor weight at each exercise session;Attend individual consultation with the RD;Educate the patient on the importance of proper hydration;Educate the patient on correlation between heart disease and cholesterol status Educate the patient on healthy weight loss;Monitor weight at each exercise session;Attend individual consultation with the RD;Educate the patient on the importance of proper hydration;Educate the patient on correlation between heart disease and cholesterol status Educate the patient on healthy weight loss;Monitor weight at each exercise session;Attend individual consultation with the RD;Educate the patient on the importance of proper hydration;Educate the patient on correlation between heart disease and cholesterol status       NUTRITION ASSESSMENT:      12/18/2021    10:45 AM 12/19/2021    10:36 AM 12/19/2021     3:27 PM 12/23/2021    10:29 AM 01/06/2022    10:29 AM 01/08/2022    10:45 AM 01/09/2022    10:45 AM   NUTRITION ASSESSMENT   Weight 247 lb 247 lb 248 lb 8 oz 248 lb 245 lb 240 lb 240 lb   Height   6' 0.008       BMI (Calculated)   33.7       Weight Management      Desires weight loss;Desires dietary guidance    Oz of water daily      48    Other Nutrition Comments      Pt will need further sessions w/ RD.        NUTRITION EDUCATION CLASSES:      10/10/2021     7:00 AM 10/31/2021     7:00 AM   NUTRITION EDUCATION   Nutrition Education Low Sodium;One to One Consult Healthy Eating - AHA Guidelines;One to One Consult   Attend date 10/03/2021 10/31/2021   Other comments referral to RD for 1:1 consult            PSYCHOSOCIAL ASSESSMENT    PSYCHOSOCIAL ASSESSMENT:      10/03/2021    11:00 AM 10/28/2021    10:42 AM 11/21/2021    10:45 AM 12/13/2021     7:00 AM 01/08/2022    10:45 AM   PSYCHOSOCIAL ASSESSEMENT   Psychosocial Method/Tool PHQ-9 PHQ-9      Psychosocial Score 9 9      Quality of Life Method/Tool Ferrans and powers       Quality of Life Score 16.11       Depression Method/Tool PHQ-9       Depression Score 9       Self-Reported Stress medium medium medium medium medium   Current State On meds On meds On meds On meds On meds   Other Psychosocial Comments takes trazadone for sleep, initial assessment completed, reports no s/s of depression/anxiety takes trazadone for sleep,  reports no s/s of depression/anxiety takes trazadone for sleep,  reports no s/s of depression/anxiety takes trazadone for sleep,  reports no s/s of depression/anxiety takes trazadone for sleep,  reports no s/s of depression/anxiety PSYCHOSOCIAL INTERVENTIONS:      10/03/2021    11:00 AM 10/28/2021    10:42 AM 11/21/2021    10:45 AM 12/13/2021     7:00 AM   PSYCHOSOCIAL INTERVENTIONS   Interventions Attend stress management class;Instruct in relaxation techniques and coping skills Attend stress management class;Instruct in  relaxation techniques and coping skills Attend stress management class;Instruct in relaxation techniques and coping skills Attend stress management class;Instruct in relaxation techniques and coping skills       PSYCHOSOCIAL EDUCATION CLASSES:      10/31/2021     7:00 AM 11/07/2021     7:00 AM 11/21/2021     7:00 AM 11/28/2021     7:00 AM 12/05/2021     7:00 AM 12/19/2021     7:00 AM 01/09/2022     7:00 AM   PSYCHOSOCIAL EDUCATION   Psychosocial Education Relaxation Techniques Relaxation Techniques Stress management;Relaxation Techniques Relaxation Techniques Relaxation Techniques Relaxation Techniques Relaxation Techniques   Attend date 10/31/2021 11/07/2021 11/21/2021 11/28/2021 12/05/2021 12/19/2021 01/09/2022   Other comments meditation  mindfulness                 OTHER CORE COMPONENTS    BLOOD PRESSURE:      10/03/2021    11:00 AM 10/28/2021    10:42 AM 12/13/2021     7:00 AM 01/08/2022    10:45 AM   BLOOD PRESSURE   Patient Goal to get to goal of 130/80 to get to goal of 130/80 to get to goal of 130/80 to get to goal of 130/80   Other Goals Patient will maintain appropriate blood pressure readings less than 130/80 Patient will maintain appropriate blood pressure readings less than 130/80 Patient will maintain appropriate blood pressure readings less than 130/80 Patient will maintain appropriate blood pressure readings less than 130/80   Interventions Monitor blood pressure at each session;Educate on importance of taking prescribed blood pressure medication correctly;Educate on importance of regular exercise program;Educate on importance of maintaining appropriate diet and weight Monitor blood pressure at each session;Educate on importance of taking prescribed blood pressure medication correctly;Educate on importance of regular exercise program;Educate on importance of maintaining appropriate diet and weight Monitor blood pressure at each session;Educate on importance of taking prescribed blood pressure medication correctly;Educate on importance of regular exercise program;Educate on importance of maintaining appropriate diet and weight Monitor blood pressure at each session;Educate on importance of taking prescribed blood pressure medication correctly;Educate on importance of regular exercise program;Educate on importance of maintaining appropriate diet and weight   Progress towards goal In progress;Verbalizes understanding In progress;Verbalizes understanding In progress;Verbalizes understanding In progress;Verbalizes understanding   Goal progress comment initial goals set and interventions initiated AEB BP at goal at today's visit AEB BP at goal on most rehab days AEB BP at goal on most rehab days     Most recent BP: 151/65        DIABETES:      10/03/2021    11:00 AM 10/28/2021    10:42 AM 12/13/2021     7:00 AM 01/08/2022    10:45 AM   DIABETES   Patient goal to lower HgbA1C to lower HgbA1C to lower HgbA1C to lower HgbA1C   Other Goals Patient will maintain an acceptable blood sugar range Patient will maintain an acceptable blood sugar range Patient will maintain an acceptable blood sugar range Patient will maintain an acceptable blood sugar range   Interventions Educate patient on compliance with home blood sugar monitoring as prescribed by physician;Educate patient on signs and symptoms of hypo- and hyperglycemia;Instruct patient to monitor blood sugar before and after exercise Educate patient on compliance with home blood sugar monitoring as prescribed by physician;Educate patient on signs and symptoms of hypo- and hyperglycemia;Instruct patient to monitor blood sugar before and after exercise Educate patient on compliance with  home blood sugar monitoring as prescribed by physician;Educate patient on signs and symptoms of hypo- and hyperglycemia;Instruct patient to monitor blood sugar before and after exercise Educate patient on compliance with home blood sugar monitoring as prescribed by physician;Educate patient on signs and symptoms of hypo- and hyperglycemia;Instruct patient to monitor blood sugar before and after exercise   HbA1c 8.3 8.3 8.3 8.3   Blood sugar 144 161     Check BS at home? Yes Yes Yes Yes   Progress towards goal In progress;Verbalizes understanding;Notify MD In progress;Verbalizes understanding;Notify MD In progress;Verbalizes understanding;Notify MD In progress;Verbalizes understanding;Notify MD   Progress towards goal - Comments message to Dr. Hollice Espy regarding diabetes education referral      Goal progress comment initial goals set and interventions initiated AEB patient will meet with RD for 1:1 consult AEB pt will continue to follow up with RD AEB pt will continue to follow up with RD       HEART FAILURE:      10/03/2021    11:00 AM 10/28/2021    10:42 AM 12/13/2021     7:00 AM 01/08/2022    10:45 AM   HEART FAILURE   Patient goal at goal on entry at goal on entry at goal on entry at goal on entry   EF % 55 55 55 55   Progress towards goal Yes Yes Yes Yes   Goal progress comment no dx of HF no dx of HF no dx of HF no dx of HF       MEDICATION COMPREHENSION:      10/03/2021    11:00 AM 10/28/2021    10:42 AM 11/21/2021    10:45 AM 12/13/2021     7:00 AM 01/08/2022    10:45 AM   MEDICATION COMPREHENSION   Patient goal to decrease need for nitroglycerin to decrease need for nitroglycerin to decrease need for nitroglycerin to decrease need for nitroglycerin to decrease need for nitroglycerin   Other Goals Patient will demonstrate knowledge of importance of taking meds as evidenced by verbalizing understanding of class, dosage, frequency and side effects. Patient will demonstrate knowledge of importance of taking meds as evidenced by verbalizing understanding of class, dosage, frequency and side effects. Patient will demonstrate knowledge of importance of taking meds as evidenced by verbalizing understanding of class, dosage, frequency and side effects. Patient will demonstrate knowledge of importance of taking meds as evidenced by verbalizing understanding of class, dosage, frequency and side effects. Patient will demonstrate knowledge of importance of taking meds as evidenced by verbalizing understanding of class, dosage, frequency and side effects.   Interventions Educate the patient on prescription and importance of time of day taken;Educate the patient on the class of medication, action and side effects of medication;Review dosage and frequency of medication with the patient Educate the patient on prescription and importance of time of day taken;Educate the patient on the class of medication, action and side effects of medication;Review dosage and frequency of medication with the patient Educate the patient on prescription and importance of time of day taken;Educate the patient on the class of medication, action and side effects of medication;Review dosage and frequency of medication with the patient Educate the patient on prescription and importance of time of day taken;Educate the patient on the class of medication, action and side effects of medication;Review dosage and frequency of medication with the patient Educate the patient on prescription and importance of time of day taken;Educate the patient on the class of medication, action  and side effects of medication;Review dosage and frequency of medication with the patient   Medications Understood Yes Yes Yes Yes Yes   Meds taken % of time 100 100 100 100 100   Progress towards goal In progress;Verbalizes understanding In progress;Verbalizes understanding In progress;Verbalizes understanding In progress;Verbalizes understanding In progress;Verbalizes understanding   Goal progress comment AEB initial goals set and interventions initiated AEB patient starting cardiac rehab AEB patient reports decreased symptoms AEB patient reports decreased symptoms AEB patient reports decreased symptoms       TOBACCO CESSATION:      10/03/2021    11:00 AM 10/28/2021    10:42 AM 11/21/2021    10:45 AM 12/13/2021     7:00 AM 01/08/2022    10:45 AM   TOBACCO CESSATION   Patient goal at goal on entry at goal on entry at goal on entry at goal on entry at goal on entry   Progress towards goal Yes Yes Yes Yes Yes   Goal progress comment AEB does not use tobacco products AEB does not use tobacco products AEB does not use tobacco products AEB does not use tobacco products AEB does not use tobacco products       OTHER CORE COMPONENTS COMMENTS:      10/03/2021    11:00 AM 10/28/2021    10:42 AM 11/21/2021    10:45 AM 12/13/2021     7:00 AM 01/08/2022    10:45 AM   OTHER COMMENTS   Other comments CTO left main CTO left main  CTO left main telemetry- wide QRS   Goal progress comment initial goals set and interventions intiated AEB initiated cardiac rehab AEB progressing workloads and reports no symptoms AEB progressing workloads and reports no symptoms AEB progressing workloads and reports no symptoms       OTHER CORE COMPONENTS EDUCATION CLASSES:      09/24/2021     7:00 AM 10/10/2021     7:00 AM 11/07/2021     7:00 AM 11/28/2021     7:00 AM 12/19/2021     7:00 AM 01/09/2022     7:00 AM   OTHER CORE COMPONENT EDUCATION   Core Component Ed Other Diabetes;Heart Anatomy;Medications;MI and Stoke;Risk Factors Risk factors MI and Stroke Heart anatomy Heart failure   Attend Date 09/24/2021 10/03/2021 11/07/2021 11/28/2021 12/19/2021 01/09/2022   Other Comments orientation to CR 1:1   arrythmias            Signed:  Pilar Plate, RN  01/09/2022 3:05 PM

## 2022-01-09 NOTE — Unmapped (Signed)
Does the patient report any symptoms since last visit ?  no  Does the patient report any medication changes since last visit ? no   Pain assessment completed in pre-exercise flow sheet.  Contraindications identified? no       Patient tolerated 15 minutes of group functional conditioning: balance training and/or strength training exercises with no complaints;  15 minutes of 1:1 individualized education in regard to disease management- patient verbalizes and demonstrates understanding.      Appropriate response to exercise session: yes   Exercise session goals :  Individual exercise goal achieved : yes        Patient attended group or individual education  yes    Family present:no  Learning tools:  Written material : yes  Audio / Visual :yes  Hands on / models no  Discussion / lecture: yes    Patient verbalizes understanding and teaching goal met  yes

## 2022-01-11 NOTE — Unmapped (Signed)
I have reviewed and approved patient's revised ITP for continuation in the cardiac rehab program with noted changes to their exercise prescription and therapy goals.     [name], MD, PhD  [facility]

## 2022-01-13 NOTE — Unmapped (Signed)
Does the patient report any symptoms since last visit ?  no  Does the patient report any medication changes since last visit ? no   Pain assessment completed in pre-exercise flow sheet.  Contraindications identified? no       Patient tolerated 15 minutes of group functional conditioning: balance training and/or strength training exercises with no complaints;  15 minutes of 1:1 individualized education in regard to disease management- patient verbalizes and demonstrates understanding.      Appropriate response to exercise session: yes   Exercise session goals :  Individual exercise goal achieved : yes

## 2022-01-16 ENCOUNTER — Ambulatory Visit: Admit: 2022-01-16 | Discharge: 2022-01-17 | Payer: MEDICARE | Attending: Family Medicine | Primary: Family Medicine

## 2022-01-16 DIAGNOSIS — R079 Chest pain, unspecified: Principal | ICD-10-CM

## 2022-01-16 DIAGNOSIS — G629 Polyneuropathy, unspecified: Principal | ICD-10-CM

## 2022-01-16 DIAGNOSIS — Z8616 History of 2019 novel coronavirus disease (COVID-19): Principal | ICD-10-CM

## 2022-01-16 DIAGNOSIS — R0600 Dyspnea, unspecified: Principal | ICD-10-CM

## 2022-01-16 DIAGNOSIS — D500 Iron deficiency anemia secondary to blood loss (chronic): Principal | ICD-10-CM

## 2022-01-16 LAB — CBC W/ AUTO DIFF
BASOPHILS ABSOLUTE COUNT: 0.1 10*9/L (ref 0.0–0.1)
BASOPHILS RELATIVE PERCENT: 1.1 %
EOSINOPHILS ABSOLUTE COUNT: 0.2 10*9/L (ref 0.0–0.5)
EOSINOPHILS RELATIVE PERCENT: 1.6 %
HEMATOCRIT: 34.6 % — ABNORMAL LOW (ref 39.0–48.0)
HEMOGLOBIN: 10.9 g/dL — ABNORMAL LOW (ref 12.9–16.5)
LYMPHOCYTES ABSOLUTE COUNT: 1.5 10*9/L (ref 1.1–3.6)
LYMPHOCYTES RELATIVE PERCENT: 14.3 %
MEAN CORPUSCULAR HEMOGLOBIN CONC: 31.6 g/dL — ABNORMAL LOW (ref 32.0–36.0)
MEAN CORPUSCULAR HEMOGLOBIN: 24.5 pg — ABNORMAL LOW (ref 25.9–32.4)
MEAN CORPUSCULAR VOLUME: 77.6 fL (ref 77.6–95.7)
MEAN PLATELET VOLUME: 8 fL (ref 6.8–10.7)
MONOCYTES ABSOLUTE COUNT: 0.7 10*9/L (ref 0.3–0.8)
MONOCYTES RELATIVE PERCENT: 6.7 %
NEUTROPHILS ABSOLUTE COUNT: 8.1 10*9/L — ABNORMAL HIGH (ref 1.8–7.8)
NEUTROPHILS RELATIVE PERCENT: 76.3 %
NUCLEATED RED BLOOD CELLS: 0 /100{WBCs} (ref <=4)
PLATELET COUNT: 344 10*9/L (ref 150–450)
RED BLOOD CELL COUNT: 4.46 10*12/L (ref 4.26–5.60)
RED CELL DISTRIBUTION WIDTH: 16.8 % — ABNORMAL HIGH (ref 12.2–15.2)
WBC ADJUSTED: 10.7 10*9/L (ref 3.6–11.2)

## 2022-01-16 LAB — BASIC METABOLIC PANEL
ANION GAP: 12 mmol/L (ref 7–15)
BLOOD UREA NITROGEN: 15 mg/dL (ref 7–21)
BUN / CREAT RATIO: 12
CALCIUM: 9.5 mg/dL (ref 8.5–10.2)
CHLORIDE: 103 mmol/L (ref 98–107)
CO2: 25 mmol/L (ref 22.0–32.0)
CREATININE: 1.3 mg/dL (ref 0.70–1.30)
EGFR CKD-EPI (2021) MALE: 57 mL/min/{1.73_m2} — ABNORMAL LOW (ref >=60)
GLUCOSE RANDOM: 184 mg/dL — ABNORMAL HIGH (ref 74–106)
POTASSIUM: 4.9 mmol/L (ref 3.5–5.0)
SODIUM: 140 mmol/L (ref 135–145)

## 2022-01-16 LAB — IRON & TIBC
IRON SATURATION: 7 %
IRON: 32 ug/dL — ABNORMAL LOW (ref 37–170)
TOTAL IRON BINDING CAPACITY: 460 ug/dL (ref 252–479)

## 2022-01-16 LAB — HEPATIC FUNCTION PANEL
ALBUMIN: 4.5 g/dL (ref 3.4–5.0)
ALKALINE PHOSPHATASE: 52 U/L (ref 38–126)
ALT (SGPT): 25 U/L (ref <50)
AST (SGOT): 33 U/L (ref 19–55)
BILIRUBIN DIRECT: 0.1 mg/dL (ref 0.00–0.40)
BILIRUBIN TOTAL: 0.4 mg/dL (ref 0.1–1.2)
PROTEIN TOTAL: 7.6 g/dL (ref 6.5–8.3)

## 2022-01-16 LAB — D-DIMER, QUANTITATIVE: D-DIMER QUANTITATIVE (CW,ML,HL,HS,CH,JS,JC,RX): 372 ng{FEU}/mL (ref <=500)

## 2022-01-16 LAB — TROPONIN I: TROPONIN I: <0.034 ng/mL (ref <0.034)

## 2022-01-16 LAB — PRO-BNP: PRO-BNP: 541 pg/mL — ABNORMAL HIGH (ref <300.0)

## 2022-01-16 MED ORDER — SUCRALFATE 1 GRAM TABLET
ORAL_TABLET | Freq: Three times a day (TID) | ORAL | 1 refills | 30 days | Status: CP
Start: 2022-01-16 — End: 2023-01-16

## 2022-01-16 MED ORDER — FERROUS SULFATE 325 MG (65 MG IRON) TABLET,DELAYED RELEASE
ORAL_TABLET | Freq: Two times a day (BID) | ORAL | 2 refills | 30 days | Status: CP
Start: 2022-01-16 — End: 2023-01-16

## 2022-01-16 MED ORDER — GABAPENTIN 100 MG CAPSULE
ORAL_CAPSULE | Freq: Three times a day (TID) | ORAL | 6 refills | 30 days | Status: CP
Start: 2022-01-16 — End: 2023-01-16

## 2022-01-16 NOTE — Unmapped (Signed)
Health Maintenance Due   Topic Date Due    COVID-19 Vaccine (1) Never done    Zoster Vaccines (1 of 2) Never done    DTaP/Tdap/Td Vaccines (1 - Tdap) 04/08/2002    Medicare Annual Wellness Visit (AWV)  01/09/2019    Influenza Vaccine (1) 12/06/2021    Retinal Eye Exam  12/13/2021    Urine Albumin/Creatinine Ratio  02/01/2022           Goals & Recommendations:    Goals and Recommendations related to your health are outlined below.    If discussed and provided at your visit today: Please, keep and record your blood sugar, blood pressure, weight, fluid intake, dietary intake and exercise amount and frequency.     Blood Pressure Goals (unless otherwise instructed):  Check 2-3 times a week.  Typical average out of office measurement: <135 mmHg systolic and < 85 mmHg diastolic (less than 135/85).  More strict recommendations for out of office measurement: < 130 mmHg systolic and < 80 mmHg diastolic (less than 130/80).  Typical in office measurement <140 mmHg systolic and <90 mmHg diastolic (less than 140/90).  Most people will not feel well if blood pressure drops to less than 110/70, unless they typically have blood pressures in this range.   If you are noticing sudden increases or decreases in blood pressure, then please contact the office.    Diabetes: Goal Hemoglobin A1c 7.9 or lower.  Goal Blood Sugars Fasting: 60-140 (may check fasting and two hours after a meal later in the day).    For Monitoring your Mood your PHQ-9 Goal is less than 10.    Drink 48 ounces of water per day.  Avoid sweetened beverages.  You will generally feel better and experience improved health when you limit, and if possible avoid, processed foods.  Diets high in plant based foods, vegetables and fruits are associated with many health benefits.    Exercise for a minimum of 20 minutes daily. Minimum of four days a week.   If possible try to obtain 60 minutes of exercise daily for five or more days per week.  Regular movement throughout the day is best.   Walking is a good form of movement.   Squats, lunges, step-ups, push-ups, planks, and arm exercises with or without weights or resistance bands are good forms of exercise.    If your BMI (body mass index) is above 25 then your body is carrying more weight than is typically healthy.  If your BMI (body mass index) is above 25 and you are not achieving the healthy weight goals that you desire, then consider the following:  Keep calories below 2500 calories per day unless otherwise instructed. Remember that all calories are not equal.  Keep a food journal and record accurately.  Read labels and ingredient lists on the products that you consume.  Sometimes we need to adjust your level of physical activity, and or tailor an exercise program more specifically for your needs, just ask and we will come up with a plan.  If you have questions about any of these recommendations, please ask.    If you smoke, stop.  If you have heart failure or severe kidney disease: weigh yourself daily and maintain fluid intake to less than 2 Liters daily unless otherwise instructed.  If you have diabetes you need to see the eye doctor at least every one to two years and have a yearly foot exam.  Set aside time daily for relaxation.  Plan a pleasant activity that you can look forward to and enjoy.  Medication changes are outlined on your After Visit Summary.  Any referrals placed today will be outlined on your After Visit Summary.    If you have questions, please call the office or use MyChart.   If you need a refill on a medication, please contact your pharmacy.  Please, allow at least two business days for responses to refill requests.    Please, bring all medications and when possible original prescription containers to each and every office visit.    If you have been provided and / or requested to record your blood sugars, blood pressures, weight, fluid intake, dietary intake, or exercise, then please bring records to each and every appointment.    When you see other providers for health care, please request that they send information and records related to your visit to our office. This includes vaccinations, mammograms, radiology, procedures, diagnostic testing, lab testing and physical exams or wellness exams that you have outside of the [facility].      [facility] at [facility]  [address]  [place], [place] [address]  Office: [phone]  Fax: [phone]               If a lab, test, imaging order or referral was placed today it will show on your AVS.    Labs:  Please obtain labs today prior to leaving, unless otherwise instructed.  If you have been asked to obtain labs at a later date, please schedule a Lab Visit appointment.    X-rays (plain films):    Plain film (traditional) x-rays do not require scheduling at [facility] or elsewhere in the [facility] system.  If you had an x-ray ordered today, our office recommends going to get the x-ray performed as soon as you can, unless otherwise instructed.  If you are obtaining X-rays (plain films) outside of the [facility] system, then please verify with your health care provider so that we can assist in coordinating any needed scheduling / appointments.    Referrals (referral for specialty consultation), Imaging (CT, MRI, Ultrasounds, Cardiac Testing, Other Imaging, Spirometry, Sleep Studies):  If there is a number on your AVS today for a location to call, then calling this number is recommended.  If there is not a number to call, then you will be contacted via your listed preferred phone number, text and or MyChart.  If you are signed up for [facility], then some information may be directed to your MyChart account as the first line of contact. For example, [place] may contact you to schedule an appointment for a referral or test first via your MyChart account. If you have signed up previously for a [facility] MyChart account, though you do not currently access or know how to access, then please let a member of our office staff know. If you would like to set up a [facility] MyChart account please see the instructions near the end of your After Visit Summary. If you need assistance with [facility], you may call the [facility] at [phone].   Phone calls to schedule through [facility] may come from [phone] or [phone] area codes.  If you have not heard regarding scheduling of your referral in the next 7 days, then please contact our office at [phone] option 5.    Insurance Specific Considerations:  Please review information from your  insurance provider regarding scheduling and coverage for specialty care, procedures and imaging.  If you know that your insurance specifically requires a referral or authorization prior to scheduling with a specialty care provider or for a test, please contact our office prior to scheduling at [phone] option 5.    Forms:  We are happy to assist with any forms you may need. We typically complete most forms within 7 business days.  For all forms that you need our office to complete, please review the forms in their entirety first, complete any areas that are required by you including any necessary signatures prior to leaving the forms with our office. Please note that many forms require signatures from patients related to consent for a health care provider or office to complete the form, where required this if often outlined on the forms that you would be submitting. Please also note the time frames for requested return of forms prior to providing to our office as well as any supporting documentation that you may need to provide. The most common reason for delayed completion of forms or return of forms to patients or submissions elsewhere are forms which are not signed by the patient where needed or forms requested for which we do not have supporting documentation or where additional information is needed prior to our completion of the requested forms. Please also note that there are instances beyond our control where forms may not be available within 7 business days.    Refills:  Please allow 3 business days for any refill requests to be processed. Please contact your pharmacy for any refills and to check the status on any refill request. In some instances your insurance may require a prior approval for certain medications. In those cases, refills are processed after we receive approval and this could possibly lengthen the turnaround time.    No Show and Late arrivals:  Please call the office when you need to cancel or reschedule an appointment, not doing so places the appointment as a no-show. If you have 3 or more no shows in a year, you may be asked to locate another provider outside of our office. If you arrive 15 minutes after your appointment time, you may be asked to reschedule.    Phone:  Our phone calls are answered Monday-Friday 8:00 am to 5:00 pm, by a staff member. Calls outside of these times and on weekends, will be answered by a registered nurse with [facility]. If needed, they will reach out to providers on call. Refills are not processed after normal business hours.     Coordination of Care:  When you see other providers for health care, please request that they send information and records related to your visit to our office. This includes vaccinations, mammograms, radiology, procedures, diagnostic testing, lab testing and physical exams or wellness exams that you have outside of the [facility] system. Our contact information is outlined below. Please periodically review your preferred contact information, preferred methods of contact and individuals listed on your contact list in our records. We recommend when possible having at least two phone numbers available for contact. If you have a MyChart account, please access and review your account periodically to update information and check for any new information that may have been sent to you.     Planning for your next visit:  Please bring any needed information to update your insurance, payment records, and preferred contact information.  Updated Medication List including supplements and over the  counter medications.  Any information related to health care visits elsewhere since your last visit, including: urgent cares, emergency departments, specialist offices, or other imaging or procedures.    We Acknowledge:  We acknowledge that while technology has made great progress in allowing [place] to communicate more and has improved some of the connectivity of health care and health care providers, these systems are not perfect. If any questions exist please always ask and if anything is unclear please let us know. In regards to tests, labs, imaging and recommendations for referrals or other procedures from our office, you should always hear something as far as an appointment, a result, a recommendation or a next step.    Billing:  For questions regarding billing please call [phone] option #4.  If you are signed up for [facility], then all billing statements will come to you directly by way of your [facility] MyChart account. Beginning in September 2023, if you are signed up for [facility], you will not receive a mailed paper billing statement from [facility]. If you have signed up previously for a [facility] MyChart account, though you do not currently access or know how to access, then please let a member of our office staff know. If you would like to set up a [facility] MyChart account please see the instructions near the end of your After Visit Summary. If you need assistance with your [facility], you may call the [facility] at [phone].     Inclement Weather:  During times of inclement weather please call the office to check on any changes in hours of operation.    Survey:  Thank you for the chance to participate in your health care.  Soon you should be receiving a survey about your recent visit to [facility].  Please take a moment to complete this survey.  We take your feedback very seriously and will use it to guide our continuous improvement efforts.     [facility] at [facility]  [address]  [place], [place] [address]  Office: [phone]  Fax: [phone]           Learning About Mindfulness for Stress  What are mindfulness and stress?    Stress is what you feel when you have to handle more than you are used to. A lot of things can cause stress. You may feel stress when you go on a job interview, take a test, or run a race. This kind of short-term stress is normal and even useful. It can help you if you need to work hard or react quickly.  Stress also can last a long time. Long-term stress is caused by stressful situations or events. Examples of long-term stress include long-term health problems, ongoing problems at work, and conflicts in your family. Long-term stress can harm your health.  Mindfulness is a focus only on things happening in the present moment. It's a process of purposefully paying attention to and being aware of your surroundings, your emotions, your thoughts, and how your body feels. You are aware of these things, but you aren't judging these experiences as good or bad. Mindfulness can help you learn to calm your mind and body to help you cope with illness, pain, and stress.  How does mindfulness help to relieve stress?  Mindfulness can help quiet your mind and relax your body. Studies show that it can help some people sleep better, feel less  anxious, and bring their blood pressure down. And it's been shown to help some people live and cope better with certain health problems like heart disease, depression, chronic pain, and cancer.  How do you practice mindfulness?  To be mindful is to pay attention, to be present, and to be accepting.  When you're mindful, you do just one thing and you pay close attention to that one thing. For example, you may sit quietly and notice your emotions or how your food tastes and smells.  When you're present, you focus on the things that are happening right now. You let go of your thoughts about the past and the future. When you dwell on the past or the future, you miss moments that can heal and strengthen you. You may miss moments like hearing a child laugh or seeing a friendly face when you think you're all alone.  When you're accepting, you don't judge the present moment. Instead you accept your thoughts and feelings as they come.  You can practice anytime, anywhere, and in any way you choose. You can practice in many ways. Here are a few ideas:  While doing your chores, like washing the dishes, let your mind focus on what's in your hand. What does the dish feel like? Is the water warm or cold?  Go outside and take a few deep breaths. What is the air like? Is it warm or cold?  When you can, take some time at the start of your day to sit alone and think.  Take a slow walk by yourself. Count your steps while you breathe in and out.  Try yoga breathing exercises, stretches, and poses to strengthen and relax your muscles.  At work, if you can, try to stop for a few moments each hour. Note how your body feels. Let yourself regroup and let your mind settle before you return to what you were doing.  If you struggle with anxiety or worry thoughts, imagine your mind as a blue sky and your worry thoughts as clouds. Now imagine those worry thoughts floating across your mind's sky. Just let them pass by as you watch.  Follow-up care is a key part of your treatment and safety. Be sure to make and go to all appointments, and call your doctor if you are having problems. It's also a good idea to know your test results and keep a list of the medicines you take.  Where can you learn more?  Go to [facility] at [email or web address]  Select Preferences in the upper right hand corner, then select Health Library under Resources. Enter [phone] in the search box to learn more about Learning About Mindfulness for Stress.  Current as of: January 15, 2016  Content Version: 11.6  ?? 2006-2018 [employer], [employer]. Care instructions adapted under license by [facility]. If you have questions about a medical condition or this instruction, always ask your healthcare professional. [employer], Inc[employer] disclaims any warranty or liability for your use of this information.         Learning About Guided Imagery for Stress  What are guided imagery and stress?    Stress is what you feel when you have to handle more than you are used to. A lot of things can cause stress. You may feel stress when you go on a job interview, take a test, or run a race. This kind of short-term stress is normal and even useful. It can help you if you need to work hard or react quickly.  Stress also can last a long time. Long-term stress is caused by stressful situations or events. Examples of long-term stress include long-term health problems, ongoing problems at work, and conflicts in your family. Long-term stress can harm your health.  Guided imagery is a technique of directed thoughts and suggestions that guide your mind toward a relaxed, focused state. This technique helps you use your mind to take you to a calm, peaceful place. You can use it to relax, which can lower blood pressure and reduce other problems related to stress.  How does guided imagery help to relieve stress?  Because of the way the mind and body are connected, guided imagery can make you feel like you are experiencing something just by imagining it. You can achieve a relaxed state when you imagine all the details of a safe, comfortable place, such as a beach or a garden. This relaxed state may help you feel more in control of your emotions and thought processes. Feeling in control may improve your attitude, health, and sense of well-being.  How do you do guided imagery?  You can use a smartphone app or a video to lead you through the process. You use all of your senses in guided imagery. For example, if you want a tropical setting, you can imagine the warm breeze on your skin, the bright blue of the water, the sound of the surf, the sweet scent of tropical flowers, and the taste of coconut. Imagining those things can make you actually feel like you're there.  But you don't have to imagine the tropics to feel peace. Guided imagery can take you to your own restful place. To give guided imagery a try, follow these steps:  Lean back comfortably in your chair. If you can, close your eyes. Put your arms on the armrests, or fold your hands in your lap.  Take a deep breath through your nose. Breathe in slowly, and then let the air out completely through your mouth.  Do it again slowly. Keep breathing like this. Gather up any tension in your body, and send it out with every breath.  Let a feeling of warmth spread from your lungs to your neck and head, down your arms to your fingertips, through your body and into your legs, all the way down to your toes. Stay this way for a moment.  Now imagine a pleasant day. You're at a park or at a place you've visited in the past where you felt at peace.  In your mind's eye, look at what lies in front of you. Maybe you see the sun, filtered through trees. Maybe clouds are drifting by.  Look to one side, and then the other. Notice the feel of the air around you. Notice how it feels on your face and on your arms.  Stay here for a while. Let it become real for you.  Follow-up care is a key part of your treatment and safety. Be sure to make and go to all appointments, and call your doctor if you are having problems. It's also a good idea to know your test results and keep a list of the medicines you take.  Where can you learn more?  Go to [facility] at [email or web address]  Select Preferences in the upper right hand corner, then select Health Library under Resources. Enter N291 in the search box to learn more about Learning About Guided Imagery for Stress.  Current as of: January 15, 2016  Content Version: 11.6  ?? 2006-2018 Healthwise,  In[employer]. Care instructions adapted under license by [facility]. If you have questions about a medical condition or this instruction, always ask your healthcare professional. Health[employer], Incorporated disclaims any warranty or liability for your use of this information.        Learning About the Mediterranean Diet  What is the Mediterranean diet?    The Mediterranean diet is a style of eating rather than a diet plan. It features foods eaten in [place], [place], southern [place] and [place], and other countries along the [employer]. It emphasizes eating foods like fish, fruits, vegetables, beans, high-fiber breads and whole grains, nuts, and olive oil. This style of eating includes limited red meat, cheese, and sweets.  Why choose the Mediterranean diet?  A Mediterranean-style diet may improve heart health. It contains more fat than other heart-healthy diets. But the fats are mainly from nuts, unsaturated oils (such as fish oils and olive oil), and certain nut or seed oils (such as canola, soybean, or flaxseed oil). These fats may help protect the heart and blood vessels.  How can you get started on the Mediterranean diet?  Here are some things you can do to switch to a more Mediterranean way of eating.  What to eat  Eat a variety of fruits and vegetables each day, such as grapes, blueberries, tomatoes, broccoli, peppers, figs, olives, spinach, eggplant, beans, lentils, and chickpeas.  Eat a variety of whole-grain foods each day, such as oats, brown rice, and whole wheat bread, pasta, and couscous.  Eat fish at least 2 times a week. Try tuna, salmon, mackerel, lake trout, herring, or sardines.  Eat moderate amounts of low-fat dairy products, such as milk, cheese, or yogurt.  Eat moderate amounts of poultry and eggs.  Choose healthy (unsaturated) fats, such as nuts, olive oil, and certain nut or seed oils like canola, soybean, and flaxseed.  Limit unhealthy (saturated) fats, such as butter, palm oil, and coconut oil. And limit fats found in animal products, such as meat and dairy products made with whole milk. Try to eat red meat only a few times a month in very small amounts.  Limit sweets and desserts to only a few times a week. This includes sugar-sweetened drinks like soda.  The Mediterranean diet may also include red wine with your meal--1 glass each day for women and up to 2 glasses a day for men.  Tips for eating at home  Use herbs, spices, garlic, lemon zest, and citrus juice instead of salt to add flavor to foods.  Add avocado slices to your sandwich instead of bacon.  Have fish for lunch or dinner instead of red meat. Brush the fish with olive oil, and broil or grill it.  Sprinkle your salad with seeds or nuts instead of cheese.  Cook with olive or canola oil instead of butter or oils that are high in saturated fat.  Switch from 2% milk or whole milk to 1% or fat-free milk.  Dip raw vegetables in a vinaigrette dressing or hummus instead of dips made from mayonnaise or sour cream.  Have a piece of fruit for dessert instead of a piece of cake. Try baked apples, or have some dried fruit.  Tips for eating out  Try broiled, grilled, baked, or poached fish instead of having it fried or breaded.  Ask your server to have your meals prepared with olive oil instead of butter.  Order dishes made with marinara sauce or sauces made from olive oil. Avoid sauces made from cream  or mayonnaise.  Choose whole-grain breads, whole wheat pasta and pizza crust, brown rice, beans, and lentils.  Cut back on butter or margarine on bread. Instead, you can dip your bread in a small amount of olive oil.  Ask for a side salad or grilled vegetables instead of french fries or chips.  Where can you learn more?  Go to [facility] at [email or web address]  [facility] under the Resources menu. Enter O407 in the search box to learn more about Learning About the Mediterranean Diet.  Current as of: February 11, 2017  Content Version: 12.2  ?? 2006-2019 [employer]. Care instructions adapted under license by [facility]. If you have questions about a medical condition or this instruction, always ask your healthcare professional. [employer] disclaims any warranty or liability for your use of this information.            Stretching: Exercises  Your Care Instructions  Here are some examples of exercises for stretching. Start each exercise slowly. Ease off the exercise if you start to have pain.  Your doctor or physical therapist will tell you when you can start these exercises and which ones will work best for you.  How to do the exercises  Latissimus stretch    Stand with your back straight and your feet shoulder-width apart. You can do this stretch sitting down if you are not steady on your feet.  Hold your arms above your head, and hold one hand with the other.  Pull upward while leaning straight over toward your right side. Keep your lower body straight. You should feel the stretch along your left side.  Hold 15 to 30 seconds, and then switch sides.  Repeat 2 to 4 times for each side.  Triceps stretch    Stand with your back straight and your feet shoulder-width apart. You can do this stretch sitting down if you are not steady on your feet.  Bring your left elbow straight up while bending your arm.  Grab your left elbow with your right hand, and pull your left elbow toward your head with light pressure. If you are more flexible, you may pull your arm slightly behind your head. You will feel the stretch along the back of your arm.  Hold 15 to 30 seconds, and then switch elbows.  Repeat 2 to 4 times for each arm.  Calf stretch    Place your hands on a wall for balance. You can also do this with your hands on the back of a chair, a countertop, or a tree.  Step back with your left leg. Keep the leg straight, and press your left heel into the floor.  Press your hips forward, bending your right leg slightly. You will feel the stretch in your left calf.  Hold the stretch 15 to 30 seconds.  Repeat 2 to 4 times for each leg.  Quadriceps stretch    Lie on your side with one hand supporting your head.  Bend your upper leg back and grab your ankle with your other hand.  Stretch your leg back by pulling your foot toward your buttocks. You will feel the stretch in the front of your thigh. If this causes stress on your knees, do not do this stretch.  Hold the stretch 15 to 30 seconds.  Repeat 2 to 4 times for each leg.  Groin stretch    Sit on the floor and put the soles of your feet together. Do not slump your back.  Grab your ankles and gently pull your legs toward you.  Press your knees toward the floor. You will feel the stretch in your inner thighs.  Hold 15 to 30 seconds.  Repeat 2 to 4 times.  Hamstring stretch in doorway    Lie on the floor near a doorway, with your buttocks close to the wall.  Let the leg you are not stretching extend through the doorway.  Put the leg you want to stretch up on the wall, and straighten your knee to feel a gentle stretch at the back of your leg.  Hold the stretch for at least 15 to 30 seconds. Repeat 2 to 4 times.  Follow-up care is a key part of your treatment and safety. Be sure to make and go to all appointments, and call your doctor if you are having problems. It's also a good idea to know your test results and keep a list of the medicines you take.  Where can you learn more?  Go to [facility] at [email or web address]  Select Preferences in the upper right hand corner, then select Health Library under Resources. Enter P733 in the search box to learn more about Stretching: Exercises.  Current as of: March 13, 2016  Content Version: 11.6  ?? 2006-2018 [employer]. Care instructions adapted under license by [facility]. If you have questions about a medical condition or this instruction, always ask your healthcare professional. [employer] disclaims any warranty or liability for your use of this information. Patient Education        Preventing Falls: Care Instructions    Talk to your doctor about the medicines you take. Ask if any of them increase the risk of falls and whether they can be changed or stopped.   Try to exercise regularly. It can help improve your strength and balance. This can help lower your risk of falling.     Practice fall safety and prevention.    Wear low-heeled shoes that fit well and give your feet good support. Talk to your doctor if you have foot problems that make this hard.  Carry a cellphone or wear a medical alert device that you can use to call for help.  Use stepladders instead of chairs to reach high objects. Don't climb if you're at risk for falls. Ask for help, if needed.  Wear the correct eyeglasses, if you need them.    Make your home safer.    Remove rugs, cords, clutter, and furniture from walkways.  Keep your house well lit. Use night-lights in hallways and bathrooms.  Install and use sturdy handrails on stairways.  Wear nonskid footwear, even inside. Don't walk barefoot or in socks without shoes.    Be safe outside.    Use handrails, curb cuts, and ramps whenever possible.  Keep your hands free by using a shoulder bag or backpack.  Try to walk in well-lit areas. Watch out for uneven ground, changes in pavement, and debris.  Be careful in the winter. Walk on the grass or gravel when sidewalks are slippery. Use de-icer on steps and walkways. Add non-slip devices to shoes.    Put grab bars and nonskid mats in your shower or tub and near the toilet. Try to use a shower chair or bath bench when bathing.   Get into a tub or shower by putting in your weaker leg first. Get out with your strong side first. Have a phone or medical alert device in the bathroom with you.   Where  can you learn more?  Go to [facility] at [email or web address] forces logistics/support/[email or web address] in the Menu. Enter G117 in the search box to learn more about Preventing Falls: Care Instructions.  Current as of: October 22, 2021               Content Version: 13.8  ?? 2006-2023 [employer].   Care instructions adapted under license by [facility]. If you have questions about a medical condition or this instruction, always ask your healthcare professional. [employer] disclaims any warranty or liability for your use of this information.

## 2022-01-16 NOTE — Unmapped (Signed)
NOTE: Patient reported extreme fatigue on TM at 14 min.  HR 108.  He refused the need to stop, workloads at 1. decreased workload to 1.73mph/0% then to 1/89mph/0% then to 0.37mph/0% and exercise stopped by this RN due to patient grimacing and reporting fatigue at 16 min 30 sec.  Reports severe bilateral arm pain.  BP 115/68, HR 101. After sitting down patient grabs at chest.  When questioned about chest pain patient reports 7/10.  Left sided chest with dizziness.  Patient assisted to stretcher in treatment room.  Becomes pale and diaphoretic.  BP 127/57, HR 84, Sats 98%.  TC to provider on call Dr. Claudette Head.  EKG completed with no resulting changes from prior EKG's.  Nitroglycerin 1 sublingual tablet administered at 11:30am per Dr. Claudette Head order for CP 5/10.  After 5 minutes patient report minimal relief from nitroglycerin rates about 4-5/10.  11:35 BP 91/69, HR 90, Sats 96%. States this is what it does and usually I have to just give it time and it goes away on its own.  Patient with known hx of chronic angina and reports taking nitroglycerin at home on multiple occasions which his cardiologist is aware along with his PCP.  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  After resting he reports that pain is nearly gone.  Rates 2/10.  BP 128/71, HR 84, Sats 97%. Has appointment with PCP today at 3:00 per his report.  Per Dr. Claudette Head allow patient to rest and if symptoms resolve he is allowed to return home.  After 10 more minutes of rest at 11:55 patient reports that pain to chest is gone.  Continues to have bilateral arm pain which is chronic for patient.  He denies any other symptoms other than fatigue.  Discharged home with wife in stable condition.  Consider anginal threshold of 1.32mph.  Will notify PCP and cardiologist.      Purpose: To decrease delays in identifying and treating symptoms of chronic angina in patients actively enrolled in the [facility] Cardiac and Pulmonary Rehabilitation Program   Scope:  Registered Nurses & Supervising Physicians must be current in ACLS:  Clinical Exercise Physiologist, Certified Nursing Assistances, and Registered Dietician must be current in BLS   Patients this applies to:  Active, established, Cardiac/Pulmonary Rehab patients that report symptoms of Angina and have an established medical diagnosis of Chronic Stable Angina as identified the problem list while participation in a rehab session.    Excluded: individuals that attend a free educational orientation or wellness participants.   911 is called for these individuals if they report symptoms.   Time   Standing order protocol/ procedure performed RN initial  Patients who report symptoms of  angina (chest/ arm/ back, jaw pain) with exercise   1st responder  clinical rehab staff EP or RN) with ACLS/BLS)  Instruct patient to discontinue exercise. Assess the onset of pain, intensity of pain (0-10 scale), and description of pain, and any radiation or associated symptoms. If the pain is described as their ???normal pain??? and no associated symptoms - proceed with this protocol.    If patient reports pain intensity greater than their ???normal??? chronic angina??? - refer and proceed with to new onset Chest pain protocol, notify supervising MD.   Assist to a seated or flat lying position and obtain vital signs. If vital signs are unstable, call 911.        Resolved If  pain resolves in 1 to 5 minutes of rest and vital signs are stable, the patient may resume exercise at a reduced workload and intensity.   The patient will be instructed to notify staff if pain returns.    Unresolved- RN ACLS nurse  If pain does not resolves in 1 to 5 minutes of rest, The ACLS RN or MD will re- assess quality and quantity of discomfort (using 0-10 pain scaled) and perform vital signs. Notify the supervision MD. If SBP is > 100, administer 0.4 mg of sublingual nitroglycerin and administer O2 at 4 liters by n/c,  Place order in EPIC in the visit navigator :  Meds & Orders.    .   Resolved If  pain is relieved within one to five  (1-5) minutes of  initial NTG , The ACLS RN or MD will re- assess quality and quantity of discomfort (using 0-10 pain scaled) and if pain resolved and vital signs within normal limits, the patient may be discharged home.     UnResolved If the chest pain does not resolve within 5 minutes after initial NTG, The ACLS RN or MD will re- assess quality and quantity of discomfort (using 0-10 pain scaled) and pain continues or worsens, repeat  vital signs and if BP greater than 90; administer a second  sublingual nitroglycerin.  Obtain a 12 Lead ECG and notify the supervising MD. Place order in EPIC in the visit navigator :  Meds & Orders.  Continue with protocol.      Resolved If pain resolves within five minutes after the second nitroglycerin, The ACLS RN or MD will re- assess quality and quantity of discomfort (using 0-10 pain scaled) and if pain resolved and vital signs within normal limits the supervising physician will provide disposition of the patient.. The RN staff will write a progress note on the incident form and sent it to the patient???s PCP and/or cardiologist by in basket in MRS or fax it the office.  The protocol / order is complete and no further action required by CR/PR staff.          Unresolved If the chest pain does not resolve within 5 minutes after the 2nd NTG The ACLS RN or MD will re- assess quality and quantity of discomfort (using 0-10 pain scaled) and pain continues or worsens, repeat  vital signs and if BP greater than 90; administer a third  sublingual nitroglycerin Place order in EPIC in the visit navigator :  Meds & Orders.  Call 911 and transfer patient to the ED.     Resolved  If pain resolves within five minutes after the second nitroglycerin, The ACLS RN or MD will re- assess quality and quantity of discomfort (using 0-10 pain scaled) and if pain resolved and vital signs within normal limits the supervising physician will provide disposition of the patient.. The RN staff will write a progress note on the incident form and sent it to the patient???s PCP and/or cardiologist by in basket in MRS or fax it the office.  The protocol / order is complete and no further action required by CR/PR staff.      Any staff  Call EMS and prepare to transfer patient to ED. RN ACLS staff to obtain peripheral IV if BP less than 90/60.     Push red button to alert other MOB department staff of EMS     When MOB staff respond-Delegate:   Await EMS arrival at the back door  Remove any patients from gym if needed   Copy face sheet , ECG, and any other documentation for EMS    RN give report to EMS on arrival     RN call report to the receiving ED     RN complete AMB CARD/PULM incident report in progress note in EPIC    RN Notify referring MD and PCP via routing the encounter to them in EPIC    RN scan in any ECG    RN document on untoward event sheet on CHAT board    RN - update patient???s treatment plan and place on medical leave until  reapproved       MD Signature: _________________________________     Date:: _________    RN Signature:__________________________    Date : __________

## 2022-01-17 LAB — FERRITIN: FERRITIN: 11.6 ng/mL

## 2022-01-19 NOTE — Unmapped (Signed)
Assessment/Plan     Diagnoses and all orders for this visit:    Jesus Rubio presented today with dyspnea and chest pain in the setting of coronary artery disease iron deficiency anemia and recent COVID-19 infection.  He had an episode of chest pain in cardiac rehab prompting him to stop rehab today.  Please see this earlier documentation today for details.  His symptoms do not clearly sound to be related to an acute coronary syndrome though his overall description and risk is such that additional evaluation is warranted to exclude acute coronary syndrome.  I do not think he has a pulmonary emboli though evaluation with labs would be helpful.  Clinically I think overall he has general fatigue in the setting of history of COVID-19 and recent anemia.  Would need to evaluate labs to exclude further progression of anemia as well.  Lab evaluation as outlined below.  No acute interventions pending lab results to exclude acute coronary syndrome, pulmonary emboli, CHF exacerbation or progressive anemia or other major organ system decompensation.    Dyspnea, unspecified type  -     CBC w/ Differential; Future  -     Basic Metabolic Panel; Future  -     Hepatic Function Panel; Future  -     Pro-BNP; Future  -     D-Dimer, Quantitative; Future  -     Troponin I; Future    Chest pain, unspecified type  -     CBC w/ Differential; Future  -     Basic Metabolic Panel; Future  -     Hepatic Function Panel; Future  -     Pro-BNP; Future  -     D-Dimer, Quantitative; Future  -     Troponin I; Future  -     ECG 12 Lead    History of 2019 novel coronavirus disease (COVID-19)  -     CBC w/ Differential; Future  -     Basic Metabolic Panel; Future  -     Hepatic Function Panel; Future  -     Pro-BNP; Future  -     D-Dimer, Quantitative; Future  -     Troponin I; Future    Iron deficiency anemia due to chronic blood loss  -     ferrous sulfate 325 (65 FE) MG EC tablet; Take 1 tablet (325 mg total) by mouth Two (2) times a day.  -     Iron & TIBC; Future  -     Ferritin; Future  -     sucralfate (CARAFATE) 1 gram tablet; Take 1 tablet (1 g total) by mouth Three (3) times a day. For stomach protection due to anemia / history of gastritis.    Peripheral polyneuropathy  Stable overall.   Standard education and anticipatory guidance regarding goals, objectives, and sequelae.  Continue current management and plan of care.  -     gabapentin (NEURONTIN) 100 MG capsule; Take 1 capsule (100 mg total) by mouth Three (3) times a day.      Stat labs ordered.    After final labs have resulted D-dimer and troponin are in normal acceptable range for reassuring levels suggestive of lack of evidence of acute coronary syndrome recently or acute pulmonary emboli.  EKG and proBNP are further reassuring.  Blood count does not demonstrate evidence of significantly progressive anemia which would contribute to symptomatology.  Overall I think his primary final etiology of present symptoms is his recent diagnosis of COVID  19 and general myalgias.  Standard education guidance and conservative measures reviewed. Precautions advised.  He will otherwise continue previous outlined recommendations plan of care.  He may return to cardiac rehab at next scheduled appointment.    Over half of 45+ minute visit spent in education, discussion, anticipatory guidance and coordination of care.      Disposition: Return in about 4 weeks (around 02/13/2022) for Recheck, Anemia.  02/13/2022      Diagnoses and plan along with any newly prescribed medication(s), recommendations, orders and therapies were discussed in detail with patient today.   Standard education and anticipatory guidance regarding goals, objectives and potential sequelae were reviewed. Patient voiced appropriate understanding of assessment and plan of care.    Pertinent handouts were given today and reviewed with the patient as indicated.  The Care Plan and Self-Management goals have been included on the AVS and the AVS has been printed.  Where helpful for monitoring and management I have encouraged the patient to keep regular logs for me to review at their next visit. Any outside resources or referrals needed at this time are noted above. No additional referrals necessary at this time. Patient voiced understanding and all questions have been answered to satisfaction.     To complete this documentation in a timely manner, portions of this encounter may have been completed utilizing Dragon voice recognition software. Please take this into consideration in noting nuances of documentation, grammar, punctuation, and wording. If and when a question exists, or for clarification, please contact the author.    Barron Alvine, MD  [facility]  [address]  [place], [name] [address]  Phone [phone]    Subjective:     [id number], Jesus Beam, MD    Jesus Rubio, is a 76 y.o. male    Chief Complaint   Patient presents with    Anemia     Patient states that he is having weakness     Chest Pain     Patient states that he was at rehab this morning and started having chest pain, patient was checked at by Dr. Claudette Head this morning. Patient states EKG was done but it still having a little chest pain     Pain     Patient states that he is having pain in his back and ribs, patient states he is in a lot of pain. Patient states he is short of breath due to where the pain is        HPI:    Patient presents: with wife.    Nursing notes and history reviewed with patient and wife.    Jesus Rubio presents today for a scheduled appointment to follow-up his anemia.  He also notes that earlier today in cardiac rehab he had chest pain with exertion.  He received evaluation by cardiac rehab staff and took a nitroglycerin.  Please see notes and related data for details.  Few weeks ago he had COVID-19.  He notes dyspnea on exertion at times and chest pain with exertion at times.  Movement also causes chest pain however description of movement sounds to be more of a musculoskeletal contributor.  He denies any palpitation or dizziness.  He generally just feels fatigued.  No acute worsening of his fatigue.  No presyncope or syncope.  He was able to ambulate here in the office with assistive device in the form of a cane.  No recent falls.  He feels like he recovered from  acute COVID-19 without complication though ongoing fatigue.  He was previously noted to have an iron deficiency anemia presumed secondary to an upper GI etiology likely gastritis based on prior evaluation as documented under procedures with evaluation with EGD and capsule endoscopy few years ago.  No obvious blood loss is otherwise been noted.  At the recent office visit we had shared decision making to monitor prior proceeding with additional evaluation given overall risk-benefit ratio.    Since patient was seen earlier today in cardiac rehab he has not taken any additional nitroglycerin.  He is not taking nitroglycerin on any increased frequency compared with previous.     Peripheral neuropathy been clinically stable.  He request refill of the gabapentin.  He denies side effects with his gabapentin.    No other complaints.  No additional recent health care visits elsewhere. No urgent care or ED visits.  No additional palliative, provocative, modifying, quantifying, qualifying, or other related factors.  Unless otherwise outlined above patient is pleased with current medication regimen, admits compliance and denies side effects of medication.    The following portions of the patient's history were reviewed and updated as appropriate: allergies, current medications, past family history, past medical history, past social history, past surgical history and problem list.    ROS: Pertinent positives and negatives are as outlined in the HPI above. The remaining balance of the 12 point review of systems is otherwise benign.      PCMH Components:      Goals         <130/80       Lack of exercise or physical activity    Increase exercise or physical activity        Get heart rate stabilized (pt-stated)         Things to think about to help me reach my goal:     What are you going to do? Take medications as prescribed, Keep positive thinking, Pace activities, Stay hydrated   How and how much? Daily   How frequent? Daily   Barriers to success?    Solutions to barriers?      06/04/17: Patient continues to work on this goal   09/29/17 Patient continues to work on this goal   12-09-2017: Reports he continues to work on this and is staying positive.        Take actions to prevent falling       12-03-2016: New goal: Will change positions slowly and pay attention to pathways. Uses cane or walker at times.   09/29/17 Patient continues to work on this goal   12-09-2017: Reports 1 fall 6-8 months ago when he missed a step and fell. Denies injury. Uses cane occasionally.              Family characteristics, patient's social characteristics, patient's cultural characteristics, patient's communication needs, patient's health literacy and behaviors affecting patient's health are outlined under the patient's History Section, Longitudinal Care Plan or Problem List in Epic or as pertinent in History of Present Illness.    Barriers to goals identified and addressed.   See Assessment and Plan for additional pertinent details.    Objective:     Physical Exam   BP 120/62  - Pulse 90  - Temp 36.8 ??C (98.2 ??F) (Oral)  - Resp 16  - Ht 182.9 cm (6' 0.01)  - Wt (!) 109.5 kg (241 lb 8 oz)  - SpO2 97%  - BMI 32.75 kg/m??   Constitutional: Oriented  to person, place, and time. Appears well-developed and well-nourished. No distress.   Head: Normocephalic and atraumatic.   Mouth/Throat: Oropharynx is without mass or concerning lesions. Moist mucous membranes.    Eyes: Conjunctivae are normal. No scleral icterus.   Neck: Neck supple. Normal range of motion. No JVD present. No thyromegaly present. No concerning neck mass.   Cardiovascular: Normal rate. Regular rhythm. Normal heart sounds. No murmur heard.  Pulmonary/Chest: Effort normal. No respiratory distress. Breath sounds normal. No wheezes. No rhonchi. No rales. No focal breath sound changes.   There is some noted generalized tenderness of the chest wall and which appear to be more of a musculoskeletal etiology.  Abdominal: Soft. Non-distended. No abdominal mass. No tenderness. No rebound. No guarding.   Musculoskeletal: No active synovitis. No concerning joint erythema, warmth or swelling.   Range of motion is without evidence of significant impairment.   Strength is without evidence of significant impairment.   No edema of the extremities.   Lymphadenopathy: There is no lymphadenopathy about the head or neck.  Neurological: Alert and oriented to person, place, and time. No cranial nerve deficit. Normal muscle tone. Normal coordination.  Skin: Skin is warm and dry. No pallor. No rash noted.   Psychiatric: Normal mood, affect and behavior. Judgment and thought content normal.

## 2022-01-20 NOTE — Unmapped (Signed)
Does the patient report any symptoms since last visit ?  no  Does the patient report any medication changes since last visit ? no   Pain assessment completed in pre-exercise flow sheet.  Contraindications identified? no       Patient tolerated 15 minutes of group functional conditioning: balance training and/or strength training exercises with no complaints;  15 minutes of 1:1 individualized education in regard to disease management- patient verbalizes and demonstrates understanding.      Appropriate response to exercise session: yes   Exercise session goals :  Individual exercise goal achieved : yes        Patient attended group or individual education  no

## 2022-01-22 ENCOUNTER — Ambulatory Visit: Admit: 2022-01-22 | Discharge: 2022-02-20 | Payer: MEDICARE

## 2022-01-22 ENCOUNTER — Ambulatory Visit: Admit: 2022-01-22 | Payer: MEDICARE

## 2022-01-22 ENCOUNTER — Institutional Professional Consult (permissible substitution): Admit: 2022-01-22 | Payer: MEDICARE

## 2022-01-22 NOTE — Unmapped (Signed)
Does the patient report any symptoms since last visit ?  no  Does the patient report any medication changes since last visit ? no   Pain assessment completed in pre-exercise flow sheet.  Contraindications identified? no       Patient tolerated 15 minutes of group functional conditioning: balance training and/or strength training exercises with no complaints;  15 minutes of 1:1 individualized education in regard to disease management- patient verbalizes and demonstrates understanding.      Appropriate response to exercise session: yes   Exercise session goals :  Individual exercise goal achieved : yes

## 2022-01-23 NOTE — Unmapped (Signed)
[facility] Refill Coordination Note    Specialty Medication(s) to be Shipped:   General Specialty: Repatha    Other medication(s) to be shipped: No additional medications requested for fill at this time     Johnothan Wivell, DOB: 10-25-1945  Phone: [phone] (home)       All above HIPAA information was verified with patient.     Was a Nurse, learning disability used for this call? No    Completed refill call assessment today to schedule patient's medication shipment from the [facility] [phone]).  All relevant notes have been reviewed.     Specialty medication(s) and dose(s) confirmed: Regimen is correct and unchanged.   Changes to medications: Aljandro reports no changes at this time.  Changes to insurance: No  New side effects reported not previously addressed with a pharmacist or physician: None reported  Questions for the pharmacist: No    Confirmed patient received a Conservation officer, historic buildings and a Surveyor, mining with first shipment. The patient will receive a drug information handout for each medication shipped and additional FDA Medication Guides as required.       DISEASE/MEDICATION-SPECIFIC INFORMATION        For patients on injectable medications: Patient currently has 0 doses left.  Next injection is scheduled for 02/01/22.    SPECIALTY MEDICATION ADHERENCE     Medication Adherence    Patient reported X missed doses in the last month: 0  Specialty Medication: Repatha 140mg /ml  Patient is on additional specialty medications: No  Patient is on more than two specialty medications: No                                Were doses missed due to medication being on hold? No      REFERRAL TO PHARMACIST     Referral to the pharmacist: Not needed      [facility]     Shipping address confirmed in Epic.     Delivery Scheduled: Yes, Expected medication delivery date: 01/29/22.     Medication will be delivered via Same Day Courier to the prescription address in Epic WAM.    [name]   [facility] Specialty Technician

## 2022-01-23 NOTE — Unmapped (Signed)
Does the patient report any symptoms since last visit ?  no  Does the patient report any medication changes since last visit ? no   Pain assessment completed in pre-exercise flow sheet.  Contraindications identified? no       Patient tolerated 15 minutes of group functional conditioning: balance training and/or strength training exercises with no complaints;  15 minutes of 1:1 individualized education in regard to disease management- patient verbalizes and demonstrates understanding.      Appropriate response to exercise session: yes   Exercise session goals :  Individual exercise goal achieved : yes        Patient attended group or individual education  yes    Family present:no  Learning tools:  Written material : no  Audio / Visual :yes  Hands on / models no  Discussion / lecture: yes    Patient verbalizes understanding and teaching goal met  yes

## 2022-01-27 NOTE — Unmapped (Signed)
Does the patient report any symptoms since last visit ?  no  Does the patient report any medication changes since last visit ? no   Pain assessment completed in pre-exercise flow sheet.  Contraindications identified? no       Patient tolerated 15 minutes of group functional conditioning: balance training and/or strength training exercises with no complaints;  15 minutes of 1:1 individualized education in regard to disease management- patient verbalizes and demonstrates understanding.      Appropriate response to exercise session: yes   Exercise session goals :  Individual exercise goal achieved : yes        Patient attended group or individual education  no

## 2022-01-29 DIAGNOSIS — I259 Chronic ischemic heart disease, unspecified: Principal | ICD-10-CM

## 2022-01-29 DIAGNOSIS — I240 Acute coronary thrombosis not resulting in myocardial infarction: Principal | ICD-10-CM

## 2022-01-29 DIAGNOSIS — E119 Type 2 diabetes mellitus without complications: Principal | ICD-10-CM

## 2022-01-29 DIAGNOSIS — Z789 Other specified health status: Principal | ICD-10-CM

## 2022-01-29 MED FILL — REPATHA SURECLICK 140 MG/ML SUBCUTANEOUS PEN INJECTOR: SUBCUTANEOUS | 28 days supply | Qty: 2 | Fill #7

## 2022-01-29 NOTE — Unmapped (Signed)
Patients exercise Rx and individual treatment plan, including nutrition and psychosocial components, were reviewed at Multidisciplinary Cardiac/Pulmonary Rehab Staff meeting.    Recommendations?  No

## 2022-01-29 NOTE — Unmapped (Signed)
Does the patient report any symptoms since last visit ?  no  Does the patient report any medication changes since last visit ? no   Pain assessment completed in pre-exercise flow sheet.  Contraindications identified? no       Patient tolerated 15 minutes of group functional conditioning: balance training and/or strength training exercises with no complaints;  15 minutes of 1:1 individualized education in regard to disease management- patient verbalizes and demonstrates understanding.      Appropriate response to exercise session: yes   Exercise session goals :  Individual exercise goal achieved : yes        Patient attended group or individual education  yes    Family present:no  Learning tools:  Written material : yes  Audio / Visual :yes  Hands on / models no  Discussion / lecture: yes    Patient verbalizes understanding and teaching goal met  yes

## 2022-01-30 NOTE — Unmapped (Signed)
Patients exercise Rx and individual treatment plan, including nutrition and psychosocial components, were reviewed at Multidisciplinary Cardiac/Pulmonary Rehab Staff meeting.    Recommendations?  No

## 2022-01-30 NOTE — Unmapped (Signed)
Does the patient report any symptoms since last visit ?  no  Does the patient report any medication changes since last visit ? no   Pain assessment completed in pre-exercise flow sheet.  Contraindications identified? no       Patient tolerated 15 minutes of group functional conditioning: balance training and/or strength training exercises with no complaints;  15 minutes of 1:1 individualized education in regard to disease management- patient verbalizes and demonstrates understanding.      Appropriate response to exercise session: yes   Exercise session goals :  Individual exercise goal achieved : yes        Patient attended group or individual education  yes    Family present:no  Learning tools:  Written material : no  Audio / Visual :yes  Hands on / models yes  Discussion / lecture: yes    Patient verbalizes understanding and teaching goal met  yes

## 2022-02-01 DIAGNOSIS — I2510 Atherosclerotic heart disease of native coronary artery without angina pectoris: Principal | ICD-10-CM

## 2022-02-01 MED ORDER — ISOSORBIDE MONONITRATE ER 30 MG TABLET,EXTENDED RELEASE 24 HR
ORAL_TABLET | Freq: Every day | ORAL | 1 refills | 0 days
Start: 2022-02-01 — End: ?

## 2022-02-03 MED ORDER — ISOSORBIDE MONONITRATE ER 30 MG TABLET,EXTENDED RELEASE 24 HR
ORAL_TABLET | Freq: Every day | ORAL | 1 refills | 90 days | Status: CP
Start: 2022-02-03 — End: ?

## 2022-02-03 NOTE — Unmapped (Signed)
CARDIAC REHABILITATION   INDIVIDUALIZED TREATMENT PLAN  ITP Phase: Revised   Date: 02/03/2022    Patient Name: Jesus Rubio                                           AACVPR Risk: High Risk  Angina Scale: 3+  Diagnosis:    Diagnosis ICD-10-CM Associated Orders   1. 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  Brief synopsis of  Tiziano Pringle progress over the past 30 days:      NOTE: Patient has began to add incline and tolerating it.  MET level 2.4.  BP at or near goal.  Meeting THR.  Has not been having as many chest pain events at home and has had no more events in rehab.  Plans to go to the senior center with his wife when he is discharged from rehab.        Reported new cardiac symptoms No.  Incidents at rehab  No.    Exercise: Demonstrates independence on all exercise modalities, warm up and cool down.Yes.    Demonstrates correct use of RPE scales and gauging intensity (understands THRR).  Yes.  Limitations to progression  Yes.    Nutrition goal to meet 1:1 with RD and improve heart healthy eating habits on track No.  Psychosocial: interacts well with staff and other participants; denies S & S of depression Participates in weekly relaxation classes and demonstrates ability to perform independently.  Yes.  Tobacco status at goal Yes.  Medication compliance at goal Yes.    Card Rehab Vitals         01/16/2022   01/20/2022   01/22/2022   01/23/2022   01/27/2022 01/29/2022   01/30/2022   02/03/2022      BP 120/62 140/85 144/62 144/69 136/59 124/75 143/71 134/80    Weight 109.5 kg (241 lb 8 oz) 108.9 kg (240 lb) 109.8 kg (242 lb) 108.4 kg (239 lb) 108.4 kg (239 lb) 108.9 kg (240 lb) 110.2 kg (243 lb) 109.3 kg (241 lb)                          EXERCISE PRESCRIPTION    EXERCISE ASSSESSMENT:      10/03/2021    11:00 AM 10/28/2021    10:42 AM 11/21/2021    10:45 AM 12/13/2021     7:00 AM 01/08/2022    10:45 AM 02/03/2022    10:40 AM   EXERCISE PRESCRIPTION   ITP Phase Initial 30 day 60 day 60 day 90 days Revised   Special Precautions / Protocols Indicated  Continuous telemetry;Diabetic precautions;Fall precautions;Orthopedic precautions;Other Continuous telemetry;Diabetic precautions;Fall precautions;Orthopedic precautions;Other Continuous telemetry;Diabetic precautions;Fall precautions;Orthopedic precautions;Other Continuous telemetry;Diabetic precautions;Fall precautions;Orthopedic precautions;Other Continuous telemetry;Diabetic precautions;Fall precautions;Orthopedic precautions;Other  Supplemental O2 Required?  No No No No No   Type of exercise test  GXT       GXT (MET Level)  1.7             EXERCISE GOALS:      10/10/2021     7:00 AM 10/28/2021    10:42 AM 11/21/2021    10:45 AM 12/13/2021     7:00 AM 01/08/2022    10:45 AM 02/03/2022    10:40 AM   EXERCISE GOALS   Patient goal will complete exercise assessment at next visit to increase stamina and strength to increase stamina and strength to increase stamina and strength to increase stamina and strength to increase stamina and strength   Other Goals  Patient will demonstrate an ability to take their own pulse;Patient will demonstrate an understanding of their exercise prescription;Patient will demonstrate knowledge of safe exercise parameters;Patient will increase the duration and/or intensity of exercise;Increase MET level as appropriate Patient will demonstrate an ability to take their own pulse;Patient will demonstrate an understanding of their exercise prescription;Patient will demonstrate knowledge of safe exercise parameters;Patient will increase the duration and/or intensity of exercise;Increase MET level as appropriate Patient will demonstrate an ability to take their own pulse;Patient will demonstrate an understanding of their exercise prescription;Patient will demonstrate knowledge of safe exercise parameters;Patient will increase the duration and/or intensity of exercise;Increase MET level as appropriate Patient will demonstrate an ability to take their own pulse;Patient will demonstrate an understanding of their exercise prescription;Patient will demonstrate knowledge of safe exercise parameters;Patient will increase the duration and/or intensity of exercise;Increase MET level as appropriate Patient will demonstrate an ability to take their own pulse;Patient will demonstrate an understanding of their exercise prescription;Patient will demonstrate knowledge of safe exercise parameters;Patient will increase the duration and/or intensity of exercise;Increase MET level as appropriate   Goal progress comment  AEB starting cardiac rehab program AEB patient increasing workloads AEB patient increasing workloads and getting on TM AEB patient increasing workloads and getting on TM AEB patient increasing workloads and getting on TM   Progress towards goal  In Progress;Verbalizes understanding In Progress;Verbalizes understanding In Progress;Verbalizes understanding In Progress;Verbalizes understanding In Progress;Verbalizes understanding       EXERCISE INTERVENTIONS:      10/28/2021    10:42 AM 11/21/2021    10:45 AM 12/13/2021     7:00 AM 01/08/2022    10:45 AM 02/03/2022    10:40 AM   EXERCISE INTERVENTIONS   Interventions Educate patient on exercise prescription, THRR, RPE scale, and MET level;Educate patient on normal/abnormal response to exercise;Educate patient on pulse-taking techniques;Orient patient to equipment safety guidelines;Participate in warm-up and cool-down Educate patient on exercise prescription, THRR, RPE scale, and MET level;Educate patient on normal/abnormal response to exercise;Educate patient on pulse-taking techniques;Orient patient to equipment safety guidelines;Participate in warm-up and cool-down Educate patient on exercise prescription, THRR, RPE scale, and MET level;Educate patient on normal/abnormal response to exercise;Educate patient on pulse-taking techniques;Orient patient to equipment safety guidelines;Participate in warm-up and cool-down Educate patient on exercise prescription, THRR, RPE scale, and MET level;Educate patient on normal/abnormal response to exercise;Educate patient on pulse-taking techniques;Orient patient to equipment safety guidelines;Participate in warm-up and cool-down Educate patient on exercise prescription, THRR, RPE scale, and MET level;Educate patient on normal/abnormal response to exercise;Educate patient on pulse-taking techniques;Orient patient to equipment safety guidelines;Participate in warm-up and cool-down         AEROBIC CONDITIONING / EXERCISE PLAN:      10/28/2021    10:42 AM  11/21/2021    10:45 AM 12/13/2021     7:00 AM 01/08/2022    10:45 AM 02/03/2022    10:40 AM   AEROBIC CONDITIONING / EXERCISE PLAN   Frequency per day 1x per day 1x per day 1x per day 1x per day 1x per day   Frequency per week 2xs weekly Other Other Other Other   Aerobic minutes per session 31 - 45 31 - 45 31 - 45 31 - 45 31 - 45   RPE/BORG 3 - 6 3 - 6 3 - 6 3 - 6 3 - 6   THRR Low 95 95 95 95 95   THRR High 105 105 105 105 105   Determined Predicted HR Rest + 20-30 bpm Rest + 20-30 bpm Rest + 20-30 bpm Rest + 20-30 bpm Rest + 20-30 bpm   Rehab Progression Decrease rest interval duration;Gradually increase frequency and duration;Other Decrease rest interval duration;Gradually increase frequency and duration;Other Decrease rest interval duration;Gradually increase frequency and duration;Other Decrease rest interval duration;Gradually increase frequency and duration;Other Decrease rest interval duration;Gradually increase frequency and duration;Other   Progression Comments establish aerobic base of 30 minutes then progress workloads by 2% every 2 weeks progress worloads by 2% bi-weekly progress worloads by 2% bi-weekly progress worloads by 2% bi-weekly progress worloads by 2% bi-weekly       AEROBIC CONDITIONING / MODES OF EXERCISE:      10/28/2021    10:42 AM 11/21/2021    10:45 AM 12/13/2021     7:00 AM 01/08/2022    10:45 AM 02/03/2022    10:40 AM   AEROBIC CONDITIONING / MODES OF EXERCISE:   Seated Elliptical Yes Yes Yes Yes Yes   Level 1 19 2 2 2    Mets 1.7 1.8 2.1 2.1    Watts 15 20 30 30 30    Treadmill   Yes Yes Yes   Incline   0 0 0.5   Mets   1.8 1.8 2.4   MPH   1 1 1.5       STRENGTH TRAINING / EXERCISE PLAN:      10/28/2021    10:42 AM 11/21/2021    10:45 AM 12/13/2021     7:00 AM 01/08/2022    10:45 AM 02/03/2022    10:40 AM   RESISTANCE TRAINING   Resistance Training Yes Yes Yes Yes Yes   Modes theraband;free weights;body weight theraband;free weights;body weight theraband;free weights;body weight theraband;free weights;body weight theraband;free weights;body weight   Frequency 2 x week 2 x week 2 x week 2 x week 2 x week   Intensity / RPE 3 - 6 3 - 6 3 - 6 3 - 6 3 - 6   Repetitions 8 - 10 10 - 12 10 - 12 10 - 12 10 - 12   Sets 2 2 2 2 2    Limitations or precautions orthopedics orthopedics orthopedics orthopedics orthopedics   Progression Increase resistance amount when patient successfully completes the prescribed number of  sets and reps with RPE below 3 (1-10 scale) Increase resistance amount when patient successfully completes the prescribed number of  sets and reps with RPE below 3 (1-10 scale) Increase resistance amount when patient successfully completes the prescribed number of  sets and reps with RPE below 3 (1-10 scale) Increase resistance amount when patient successfully completes the prescribed number of  sets and reps with RPE below 3 (1-10 scale) Increase resistance amount when patient successfully completes the prescribed number of  sets  and reps with RPE below 3 (1-10 scale)       OTHER TRAINING / EXERCISE PLAN:      10/28/2021    10:42 AM 11/21/2021    10:45 AM 12/13/2021     7:00 AM 01/08/2022    10:45 AM 02/03/2022    10:40 AM   OTHER TRAINING   Balance Training Yes - Daily dynamic balance exercises in warm up and cool down phase of aerobic exercise session. Yes - Daily dynamic balance exercises in warm up and cool down phase of aerobic exercise session. Yes - Daily dynamic balance exercises in warm up and cool down phase of aerobic exercise session. Yes - Daily dynamic balance exercises in warm up and cool down phase of aerobic exercise session. Yes - Daily dynamic balance exercises in warm up and cool down phase of aerobic exercise session.   Balance Score 3       Flexibility training type static stretching post cardio or resistance training for all major muscle groups x 16 - 30sec duration. static stretching post cardio or resistance training for all major muscle groups x 16 - 30sec duration. static stretching post cardio or resistance training for all major muscle groups x 16 - 30sec duration. static stretching post cardio or resistance training for all major muscle groups x 16 - 30sec duration. static stretching post cardio or resistance training for all major muscle groups x 16 - 30sec duration.   Flexibility R/L Right;Left       Right score -6       Left score -5.5       Lower Body Strength 4       Home Exercise No No No No No   Other Exercise Comments no formal HEP no formal HEP no formal HEP no formal HEP no formal HEP, is going to start attending the senior center       EXERCISE EDUCATION CLASSES:      09/24/2021     7:00 AM 10/28/2021     7:00 AM 10/31/2021     7:00 AM 12/05/2021     7:00 AM   EXERCISE EDUCATION   Exercise Education Safe Exercise Parameters Exercise Prescription;Safe Exercise Parameters;Strength Training Other Other   Attend date 09/24/2021 10/28/2021 10/31/2021 12/05/2021   Other comments contraindications to exerise  benefits of cardiac rehab understanidng METS           NUTRITION ASSESSMENT    NUTRITION GOALS:      10/03/2021    11:00 AM 10/28/2021    10:42 AM 10/31/2021    10:45 AM 11/21/2021    10:45 AM 12/13/2021     7:00 AM 01/08/2022    10:45 AM 02/03/2022    10:40 AM   NUTRITION GOALS   Patient goal to meet with RD for 1:1 consult to meet with RD for 1:1 consult Goal #1: Achieve a healthy body weight (New)    Goal #2: Attain optimal blood chemistries: non-fasting LDL, fasting lipid profile, fasting glucose and A1C (New)    Goal #3: Establish healthy eating patterns to meet nutrient needs (New) Goal #1: Achieve a healthy body weight (New)    Goal #2: Attain optimal blood chemistries: non-fasting LDL, fasting lipid profile, fasting glucose and A1C (New)    Goal #3: Establish healthy eating patterns to meet nutrient needs (New) Goal #1: Achieve a healthy body weight (New)    Goal #2: Attain optimal blood chemistries: non-fasting LDL, fasting lipid profile, fasting glucose and A1C (New)  Goal #3: Establish healthy eating patterns to meet nutrient needs (New) Goal #1: Achieve a healthy body weight (New)    Goal #2: Attain optimal blood chemistries: non-fasting LDL, fasting lipid profile, fasting glucose and A1C (New)    Goal #3: Establish healthy eating patterns to meet nutrient needs (New) Goal #1: Achieve a healthy body weight (New)    Goal #2: Attain optimal blood chemistries: non-fasting LDL, fasting lipid profile, fasting glucose and A1C (New)    Goal #3: Establish healthy eating patterns to meet nutrient needs (New)   Other Goals Patient will lose 1-2 lbs. per week if BMI is greater than 25;Patient will decrease waist circumference Patient will lose 1-2 lbs. per week if BMI is greater than 25;Patient will decrease waist circumference Patient will lose 1-2 lbs. per week if BMI is greater than 25;Patient will improve nutrition assessment score;Patient will decrease waist circumference;Patient will increase daily water intake Patient will lose 1-2 lbs. per week if BMI is greater than 25;Patient will improve nutrition assessment score;Patient will decrease waist circumference;Patient will increase daily water intake Patient will lose 1-2 lbs. per week if BMI is greater than 25;Patient will improve nutrition assessment score;Patient will decrease waist circumference;Patient will increase daily water intake Patient will lose 1-2 lbs. per week if BMI is greater than 25;Patient will improve nutrition assessment score;Patient will decrease waist circumference;Patient will increase daily water intake Patient will lose 1-2 lbs. per week if BMI is greater than 25;Patient will improve nutrition assessment score;Patient will decrease waist circumference;Patient will increase daily water intake   Progress towards goal In progress;Verbalizes understanding;Other In progress;Verbalizes understanding;Other In progress In progress In progress In progress In progress   Goal progress comment will meet with RD for 1:1 consult will meet with RD for 1:1 consult  has met with RD for 1:1 consult has met with RD for 1:1 consult has met with RD for 1:1 consult has met with RD for 1:1 consult       NUTRITION INTERVENTIONS:      10/03/2021    11:00 AM 10/28/2021    10:42 AM 10/31/2021    10:45 AM 11/21/2021    10:45 AM 12/13/2021     7:00 AM 01/08/2022    10:45 AM 02/03/2022    10:40 AM   NUTRITION INTERVENTIONS   Interventions Attend individual consultation with the RD Attend individual consultation with the RD Educate the patient on healthy weight loss;Monitor weight at each exercise session;Attend individual consultation with the RD;Educate the patient on the importance of proper hydration;Educate the patient on correlation between heart disease and cholesterol status Educate the patient on healthy weight loss;Monitor weight at each exercise session;Attend individual consultation with the RD;Educate the patient on the importance of proper hydration;Educate the patient on correlation between heart disease and cholesterol status Educate the patient on healthy weight loss;Monitor weight at each exercise session;Attend individual consultation with the RD;Educate the patient on the importance of proper hydration;Educate the patient on correlation between heart disease and cholesterol status Educate the patient on healthy weight loss;Monitor weight at each exercise session;Attend individual consultation with the RD;Educate the patient on the importance of proper hydration;Educate the patient on correlation between heart disease and cholesterol status Educate the patient on healthy weight loss;Monitor weight at each exercise session;Attend individual consultation with the RD;Educate the patient on the importance of proper hydration;Educate the patient on correlation between heart disease and cholesterol status       NUTRITION ASSESSMENT:      01/20/2022  10:34 AM 01/22/2022    10:45 AM 01/23/2022    10:32 AM 01/27/2022    10:38 AM 01/29/2022    10:45 AM 01/30/2022     7:00 AM 02/03/2022    10:40 AM   NUTRITION ASSESSMENT   Weight 240 lb 242 lb 239 lb 239 lb 240 lb 243 lb 241 lb   Weight Management       Desires weight loss;Desires dietary guidance   Oz of water daily       48   Other Nutrition Comments       Pt will need further sessions w/ RD.       NUTRITION EDUCATION CLASSES:      10/10/2021     7:00 AM 10/31/2021     7:00 AM   NUTRITION EDUCATION   Nutrition Education Low Sodium;One to One Consult Healthy Eating - AHA Guidelines;One to One Consult   Attend date 10/03/2021 10/31/2021   Other comments referral to RD for 1:1 consult            PSYCHOSOCIAL ASSESSMENT    PSYCHOSOCIAL ASSESSMENT:      10/03/2021    11:00 AM 10/28/2021    10:42 AM 11/21/2021    10:45 AM 12/13/2021     7:00 AM 01/08/2022    10:45 AM 02/03/2022    10:40 AM   PSYCHOSOCIAL ASSESSEMENT   Psychosocial Method/Tool PHQ-9 PHQ-9       Psychosocial Score 9 9       Quality of Life Method/Tool Ferrans and powers        Quality of Life Score 16.11        Depression Method/Tool PHQ-9        Depression Score 9        Self-Reported Stress medium medium medium medium medium medium   Current State On meds On meds On meds On meds On meds On meds   Other Psychosocial Comments takes trazadone for sleep, initial assessment completed, reports no s/s of depression/anxiety takes trazadone for sleep,  reports no s/s of depression/anxiety takes trazadone for sleep,  reports no s/s of depression/anxiety takes trazadone for sleep,  reports no s/s of depression/anxiety takes trazadone for sleep,  reports no s/s of depression/anxiety takes trazadone for sleep,  reports no s/s of depression/anxiety       PSYCHOSOCIAL INTERVENTIONS:      10/03/2021    11:00 AM 10/28/2021    10:42 AM 11/21/2021    10:45 AM 12/13/2021     7:00 AM 02/03/2022    10:40 AM   PSYCHOSOCIAL INTERVENTIONS   Interventions Attend stress management class;Instruct in relaxation techniques and coping skills Attend stress management class;Instruct in relaxation techniques and coping skills Attend stress management class;Instruct in relaxation techniques and coping skills Attend stress management class;Instruct in relaxation techniques and coping skills Attend stress management class;Instruct in relaxation techniques and coping skills       PSYCHOSOCIAL EDUCATION CLASSES:      11/21/2021     7:00 AM 11/28/2021     7:00 AM 12/05/2021     7:00 AM 12/19/2021     7:00 AM 01/09/2022     7:00 AM 01/23/2022     7:00 AM 01/30/2022     7:00 AM   PSYCHOSOCIAL EDUCATION   Psychosocial Education Stress management;Relaxation Techniques Relaxation Techniques Relaxation Techniques Relaxation Techniques Relaxation Techniques Relaxation Techniques Relaxation Techniques   Attend date 11/21/2021 11/28/2021 12/05/2021 12/19/2021 01/09/2022 01/23/2022 01/30/2022   Other comments mindfulness  PMR             OTHER CORE COMPONENTS    BLOOD PRESSURE:      10/03/2021    11:00 AM 10/28/2021    10:42 AM 12/13/2021     7:00 AM 01/08/2022    10:45 AM 02/03/2022    10:40 AM   BLOOD PRESSURE   Patient Goal to get to goal of 130/80 to get to goal of 130/80 to get to goal of 130/80 to get to goal of 130/80 to get to goal of 130/80   Other Goals Patient will maintain appropriate blood pressure readings less than 130/80 Patient will maintain appropriate blood pressure readings less than 130/80 Patient will maintain appropriate blood pressure readings less than 130/80 Patient will maintain appropriate blood pressure readings less than 130/80 Patient will maintain appropriate blood pressure readings less than 130/80   Interventions Monitor blood pressure at each session;Educate on importance of taking prescribed blood pressure medication correctly;Educate on importance of regular exercise program;Educate on importance of maintaining appropriate diet and weight Monitor blood pressure at each session;Educate on importance of taking prescribed blood pressure medication correctly;Educate on importance of regular exercise program;Educate on importance of maintaining appropriate diet and weight Monitor blood pressure at each session;Educate on importance of taking prescribed blood pressure medication correctly;Educate on importance of regular exercise program;Educate on importance of maintaining appropriate diet and weight Monitor blood pressure at each session;Educate on importance of taking prescribed blood pressure medication correctly;Educate on importance of regular exercise program;Educate on importance of maintaining appropriate diet and weight Monitor blood pressure at each session;Educate on importance of taking prescribed blood pressure medication correctly;Educate on importance of regular exercise program;Educate on importance of maintaining appropriate diet and weight   Progress towards goal In progress;Verbalizes understanding In progress;Verbalizes understanding In progress;Verbalizes understanding In progress;Verbalizes understanding In progress;Verbalizes understanding   Goal progress comment initial goals set and interventions initiated AEB BP at goal at today's visit AEB BP at goal on most rehab days AEB BP at goal on most rehab days AEB BP at goal on most rehab days     Most recent BP: 134/80        DIABETES:      10/03/2021    11:00 AM 10/28/2021    10:42 AM 12/13/2021     7:00 AM 01/08/2022    10:45 AM 02/03/2022    10:40 AM   DIABETES   Patient goal to lower HgbA1C to lower HgbA1C to lower HgbA1C to lower HgbA1C to lower HgbA1C   Other Goals Patient will maintain an acceptable blood sugar range Patient will maintain an acceptable blood sugar range Patient will maintain an acceptable blood sugar range Patient will maintain an acceptable blood sugar range Patient will maintain an acceptable blood sugar range   Interventions Educate patient on compliance with home blood sugar monitoring as prescribed by physician;Educate patient on signs and symptoms of hypo- and hyperglycemia;Instruct patient to monitor blood sugar before and after exercise Educate patient on compliance with home blood sugar monitoring as prescribed by physician;Educate patient on signs and symptoms of hypo- and hyperglycemia;Instruct patient to monitor blood sugar before and after exercise Educate patient on compliance with home blood sugar monitoring as prescribed by physician;Educate patient on signs and symptoms of hypo- and hyperglycemia;Instruct patient to monitor blood sugar before and after exercise Educate patient on compliance with home blood sugar monitoring as prescribed by physician;Educate patient on signs and symptoms of hypo- and hyperglycemia;Instruct patient to monitor blood sugar  before and after exercise Educate patient on compliance with home blood sugar monitoring as prescribed by physician;Educate patient on signs and symptoms of hypo- and hyperglycemia;Instruct patient to monitor blood sugar before and after exercise   HbA1c 8.3 8.3 8.3 8.3 8.3   Blood sugar 144 161      Check BS at home? Yes Yes Yes Yes Yes   Progress towards goal In progress;Verbalizes understanding;Notify MD In progress;Verbalizes understanding;Notify MD In progress;Verbalizes understanding;Notify MD In progress;Verbalizes understanding;Notify MD In progress;Verbalizes understanding;Notify MD   Progress towards goal - Comments message to Dr. Hollice Espy regarding diabetes education referral       Goal progress comment initial goals set and interventions initiated AEB patient will meet with RD for 1:1 consult AEB pt will continue to follow up with RD AEB pt will continue to follow up with RD AEB pt will continue to follow up with RD       HEART FAILURE:      10/03/2021    11:00 AM 10/28/2021    10:42 AM 12/13/2021     7:00 AM 01/08/2022    10:45 AM 02/03/2022    10:40 AM   HEART FAILURE   Patient goal at goal on entry at goal on entry at goal on entry at goal on entry at goal on entry   EF % 55 55 55 55 55   Progress towards goal Yes Yes Yes Yes Yes   Goal progress comment no dx of HF no dx of HF no dx of HF no dx of HF no dx of HF       MEDICATION COMPREHENSION:      10/03/2021    11:00 AM 10/28/2021    10:42 AM 11/21/2021    10:45 AM 12/13/2021     7:00 AM 01/08/2022    10:45 AM 02/03/2022    10:40 AM   MEDICATION COMPREHENSION   Patient goal to decrease need for nitroglycerin to decrease need for nitroglycerin to decrease need for nitroglycerin to decrease need for nitroglycerin to decrease need for nitroglycerin to decrease need for nitroglycerin   Other Goals Patient will demonstrate knowledge of importance of taking meds as evidenced by verbalizing understanding of class, dosage, frequency and side effects. Patient will demonstrate knowledge of importance of taking meds as evidenced by verbalizing understanding of class, dosage, frequency and side effects. Patient will demonstrate knowledge of importance of taking meds as evidenced by verbalizing understanding of class, dosage, frequency and side effects. Patient will demonstrate knowledge of importance of taking meds as evidenced by verbalizing understanding of class, dosage, frequency and side effects. Patient will demonstrate knowledge of importance of taking meds as evidenced by verbalizing understanding of class, dosage, frequency and side effects. Patient will demonstrate knowledge of importance of taking meds as evidenced by verbalizing understanding of class, dosage, frequency and side effects.   Interventions Educate the patient on prescription and importance of time of day taken;Educate the patient on the class of medication, action and side effects of medication;Review dosage and frequency of medication with the patient Educate the patient on prescription and importance of time of day taken;Educate the patient on the class of medication, action and side effects of medication;Review dosage and frequency of medication with the patient Educate the patient on prescription and importance of time of day taken;Educate the patient on the class of medication, action and side effects of medication;Review dosage and frequency of medication with the patient Educate the patient on prescription and importance of time of  day taken;Educate the patient on the class of medication, action and side effects of medication;Review dosage and frequency of medication with the patient Educate the patient on prescription and importance of time of day taken;Educate the patient on the class of medication, action and side effects of medication;Review dosage and frequency of medication with the patient Educate the patient on prescription and importance of time of day taken;Educate the patient on the class of medication, action and side effects of medication;Review dosage and frequency of medication with the patient   Medications Understood Yes Yes Yes Yes Yes Yes   Meds taken % of time 100 100 100 100 100 100   Progress towards goal In progress;Verbalizes understanding In progress;Verbalizes understanding In progress;Verbalizes understanding In progress;Verbalizes understanding In progress;Verbalizes understanding In progress;Verbalizes understanding   Goal progress comment AEB initial goals set and interventions initiated AEB patient starting cardiac rehab AEB patient reports decreased symptoms AEB patient reports decreased symptoms AEB patient reports decreased symptoms AEB patient reports decreased symptoms       TOBACCO CESSATION:      10/03/2021    11:00 AM 10/28/2021    10:42 AM 11/21/2021    10:45 AM 12/13/2021     7:00 AM 01/08/2022    10:45 AM 02/03/2022    10:40 AM   TOBACCO CESSATION   Patient goal at goal on entry at goal on entry at goal on entry at goal on entry at goal on entry at goal on entry   Progress towards goal Yes Yes Yes Yes Yes Yes   Goal progress comment AEB does not use tobacco products AEB does not use tobacco products AEB does not use tobacco products AEB does not use tobacco products AEB does not use tobacco products AEB does not use tobacco products       OTHER CORE COMPONENTS COMMENTS:      10/03/2021    11:00 AM 10/28/2021    10:42 AM 11/21/2021    10:45 AM 12/13/2021     7:00 AM 01/08/2022    10:45 AM 02/03/2022    10:40 AM   OTHER COMMENTS   Other comments CTO left main CTO left main  CTO left main telemetry- wide QRS telemetry- wide QRS   Goal progress comment initial goals set and interventions intiated AEB initiated cardiac rehab AEB progressing workloads and reports no symptoms AEB progressing workloads and reports no symptoms AEB progressing workloads and reports no symptoms AEB progressing workloads and reports no symptoms       OTHER CORE COMPONENTS EDUCATION CLASSES:      10/10/2021     7:00 AM 11/07/2021     7:00 AM 11/28/2021     7:00 AM 12/19/2021     7:00 AM 01/09/2022     7:00 AM 01/23/2022     7:00 AM 01/29/2022     7:00 AM OTHER CORE COMPONENT EDUCATION   Core Component Ed Diabetes;Heart Anatomy;Medications;MI and [place];Risk Factors Risk factors MI and Stroke Heart anatomy Heart failure Risk factors Heart anatomy   Attend Date 10/03/2021 11/07/2021 11/28/2021 12/19/2021 01/09/2022 01/23/2022 01/29/2022   Other Comments 1:1   arrythmias   the heart and how it works           Signed:  Pilar Plate, RN  02/03/2022 4:18 PM

## 2022-02-03 NOTE — Unmapped (Signed)
Does the patient report any symptoms since last visit ?  no  Does the patient report any medication changes since last visit ? no   Pain assessment completed in pre-exercise flow sheet.  Contraindications identified? no       Patient tolerated 15 minutes of group functional conditioning: balance training and/or strength training exercises with no complaints;  15 minutes of 1:1 individualized education in regard to disease management- patient verbalizes and demonstrates understanding.      Appropriate response to exercise session: no   Exercise session goals :  Individual exercise goal achieved : yes        Patient attended group or individual education  no

## 2022-02-03 NOTE — Unmapped (Signed)
CARDIAC REHABILITATION   INDIVIDUALIZED TREATMENT PLAN  ITP Phase: Revised   Date: 02/03/2022    Patient Name: Jesus Rubio                                           AACVPR Risk: High Risk  Angina Scale: 3+  Diagnosis:    Diagnosis ICD-10-CM Associated Orders   1. 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  Brief synopsis of  Tiziano Pringle progress over the past 30 days:      NOTE: Patient has began to add incline and tolerating it.  MET level 2.4.  BP at or near goal.  Meeting THR.  Has not been having as many chest pain events at home and has had no more events in rehab.  Plans to go to the senior center with his wife when he is discharged from rehab.        Reported new cardiac symptoms No.  Incidents at rehab  No.    Exercise: Demonstrates independence on all exercise modalities, warm up and cool down.Yes.    Demonstrates correct use of RPE scales and gauging intensity (understands THRR).  Yes.  Limitations to progression  Yes.    Nutrition goal to meet 1:1 with RD and improve heart healthy eating habits on track No.  Psychosocial: interacts well with staff and other participants; denies S & S of depression Participates in weekly relaxation classes and demonstrates ability to perform independently.  Yes.  Tobacco status at goal Yes.  Medication compliance at goal Yes.    Card Rehab Vitals         01/16/2022   01/20/2022   01/22/2022   01/23/2022   01/27/2022 01/29/2022   01/30/2022   02/03/2022      BP 120/62 140/85 144/62 144/69 136/59 124/75 143/71 134/80    Weight 109.5 kg (241 lb 8 oz) 108.9 kg (240 lb) 109.8 kg (242 lb) 108.4 kg (239 lb) 108.4 kg (239 lb) 108.9 kg (240 lb) 110.2 kg (243 lb) 109.3 kg (241 lb)                          EXERCISE PRESCRIPTION    EXERCISE ASSSESSMENT:      10/03/2021    11:00 AM 10/28/2021    10:42 AM 11/21/2021    10:45 AM 12/13/2021     7:00 AM 01/08/2022    10:45 AM 02/03/2022    10:40 AM   EXERCISE PRESCRIPTION   ITP Phase Initial 30 day 60 day 60 day 90 days Revised   Special Precautions / Protocols Indicated  Continuous telemetry;Diabetic precautions;Fall precautions;Orthopedic precautions;Other Continuous telemetry;Diabetic precautions;Fall precautions;Orthopedic precautions;Other Continuous telemetry;Diabetic precautions;Fall precautions;Orthopedic precautions;Other Continuous telemetry;Diabetic precautions;Fall precautions;Orthopedic precautions;Other Continuous telemetry;Diabetic precautions;Fall precautions;Orthopedic precautions;Other  Supplemental O2 Required?  No No No No No   Type of exercise test  GXT       GXT (MET Level)  1.7             EXERCISE GOALS:      10/10/2021     7:00 AM 10/28/2021    10:42 AM 11/21/2021    10:45 AM 12/13/2021     7:00 AM 01/08/2022    10:45 AM 02/03/2022    10:40 AM   EXERCISE GOALS   Patient goal will complete exercise assessment at next visit to increase stamina and strength to increase stamina and strength to increase stamina and strength to increase stamina and strength to increase stamina and strength   Other Goals  Patient will demonstrate an ability to take their own pulse;Patient will demonstrate an understanding of their exercise prescription;Patient will demonstrate knowledge of safe exercise parameters;Patient will increase the duration and/or intensity of exercise;Increase MET level as appropriate Patient will demonstrate an ability to take their own pulse;Patient will demonstrate an understanding of their exercise prescription;Patient will demonstrate knowledge of safe exercise parameters;Patient will increase the duration and/or intensity of exercise;Increase MET level as appropriate Patient will demonstrate an ability to take their own pulse;Patient will demonstrate an understanding of their exercise prescription;Patient will demonstrate knowledge of safe exercise parameters;Patient will increase the duration and/or intensity of exercise;Increase MET level as appropriate Patient will demonstrate an ability to take their own pulse;Patient will demonstrate an understanding of their exercise prescription;Patient will demonstrate knowledge of safe exercise parameters;Patient will increase the duration and/or intensity of exercise;Increase MET level as appropriate Patient will demonstrate an ability to take their own pulse;Patient will demonstrate an understanding of their exercise prescription;Patient will demonstrate knowledge of safe exercise parameters;Patient will increase the duration and/or intensity of exercise;Increase MET level as appropriate   Goal progress comment  AEB starting cardiac rehab program AEB patient increasing workloads AEB patient increasing workloads and getting on TM AEB patient increasing workloads and getting on TM AEB patient increasing workloads and getting on TM   Progress towards goal  In Progress;Verbalizes understanding In Progress;Verbalizes understanding In Progress;Verbalizes understanding In Progress;Verbalizes understanding In Progress;Verbalizes understanding       EXERCISE INTERVENTIONS:      10/28/2021    10:42 AM 11/21/2021    10:45 AM 12/13/2021     7:00 AM 01/08/2022    10:45 AM 02/03/2022    10:40 AM   EXERCISE INTERVENTIONS   Interventions Educate patient on exercise prescription, THRR, RPE scale, and MET level;Educate patient on normal/abnormal response to exercise;Educate patient on pulse-taking techniques;Orient patient to equipment safety guidelines;Participate in warm-up and cool-down Educate patient on exercise prescription, THRR, RPE scale, and MET level;Educate patient on normal/abnormal response to exercise;Educate patient on pulse-taking techniques;Orient patient to equipment safety guidelines;Participate in warm-up and cool-down Educate patient on exercise prescription, THRR, RPE scale, and MET level;Educate patient on normal/abnormal response to exercise;Educate patient on pulse-taking techniques;Orient patient to equipment safety guidelines;Participate in warm-up and cool-down Educate patient on exercise prescription, THRR, RPE scale, and MET level;Educate patient on normal/abnormal response to exercise;Educate patient on pulse-taking techniques;Orient patient to equipment safety guidelines;Participate in warm-up and cool-down Educate patient on exercise prescription, THRR, RPE scale, and MET level;Educate patient on normal/abnormal response to exercise;Educate patient on pulse-taking techniques;Orient patient to equipment safety guidelines;Participate in warm-up and cool-down         AEROBIC CONDITIONING / EXERCISE PLAN:      10/28/2021    10:42 AM  11/21/2021    10:45 AM 12/13/2021     7:00 AM 01/08/2022    10:45 AM 02/03/2022    10:40 AM   AEROBIC CONDITIONING / EXERCISE PLAN   Frequency per day 1x per day 1x per day 1x per day 1x per day 1x per day   Frequency per week 2xs weekly Other Other Other Other   Aerobic minutes per session 31 - 45 31 - 45 31 - 45 31 - 45 31 - 45   RPE/BORG 3 - 6 3 - 6 3 - 6 3 - 6 3 - 6   THRR Low 95 95 95 95 95   THRR High 105 105 105 105 105   Determined Predicted HR Rest + 20-30 bpm Rest + 20-30 bpm Rest + 20-30 bpm Rest + 20-30 bpm Rest + 20-30 bpm   Rehab Progression Decrease rest interval duration;Gradually increase frequency and duration;Other Decrease rest interval duration;Gradually increase frequency and duration;Other Decrease rest interval duration;Gradually increase frequency and duration;Other Decrease rest interval duration;Gradually increase frequency and duration;Other Decrease rest interval duration;Gradually increase frequency and duration;Other   Progression Comments establish aerobic base of 30 minutes then progress workloads by 2% every 2 weeks progress worloads by 2% bi-weekly progress worloads by 2% bi-weekly progress worloads by 2% bi-weekly progress worloads by 2% bi-weekly       AEROBIC CONDITIONING / MODES OF EXERCISE:      10/28/2021    10:42 AM 11/21/2021    10:45 AM 12/13/2021     7:00 AM 01/08/2022    10:45 AM 02/03/2022    10:40 AM   AEROBIC CONDITIONING / MODES OF EXERCISE:   Seated Elliptical Yes Yes Yes Yes Yes   Level 1 19 2 2 2    Mets 1.7 1.8 2.1 2.1    Watts 15 20 30 30 30    Treadmill   Yes Yes Yes   Incline   0 0 0.5   Mets   1.8 1.8 2.4   MPH   1 1 1.5       STRENGTH TRAINING / EXERCISE PLAN:      10/28/2021    10:42 AM 11/21/2021    10:45 AM 12/13/2021     7:00 AM 01/08/2022    10:45 AM 02/03/2022    10:40 AM   RESISTANCE TRAINING   Resistance Training Yes Yes Yes Yes Yes   Modes theraband;free weights;body weight theraband;free weights;body weight theraband;free weights;body weight theraband;free weights;body weight theraband;free weights;body weight   Frequency 2 x week 2 x week 2 x week 2 x week 2 x week   Intensity / RPE 3 - 6 3 - 6 3 - 6 3 - 6 3 - 6   Repetitions 8 - 10 10 - 12 10 - 12 10 - 12 10 - 12   Sets 2 2 2 2 2    Limitations or precautions orthopedics orthopedics orthopedics orthopedics orthopedics   Progression Increase resistance amount when patient successfully completes the prescribed number of  sets and reps with RPE below 3 (1-10 scale) Increase resistance amount when patient successfully completes the prescribed number of  sets and reps with RPE below 3 (1-10 scale) Increase resistance amount when patient successfully completes the prescribed number of  sets and reps with RPE below 3 (1-10 scale) Increase resistance amount when patient successfully completes the prescribed number of  sets and reps with RPE below 3 (1-10 scale) Increase resistance amount when patient successfully completes the prescribed number of  sets  and reps with RPE below 3 (1-10 scale)       OTHER TRAINING / EXERCISE PLAN:      10/28/2021    10:42 AM 11/21/2021    10:45 AM 12/13/2021     7:00 AM 01/08/2022    10:45 AM 02/03/2022    10:40 AM   OTHER TRAINING   Balance Training Yes - Daily dynamic balance exercises in warm up and cool down phase of aerobic exercise session. Yes - Daily dynamic balance exercises in warm up and cool down phase of aerobic exercise session. Yes - Daily dynamic balance exercises in warm up and cool down phase of aerobic exercise session. Yes - Daily dynamic balance exercises in warm up and cool down phase of aerobic exercise session. Yes - Daily dynamic balance exercises in warm up and cool down phase of aerobic exercise session.   Balance Score 3       Flexibility training type static stretching post cardio or resistance training for all major muscle groups x 16 - 30sec duration. static stretching post cardio or resistance training for all major muscle groups x 16 - 30sec duration. static stretching post cardio or resistance training for all major muscle groups x 16 - 30sec duration. static stretching post cardio or resistance training for all major muscle groups x 16 - 30sec duration. static stretching post cardio or resistance training for all major muscle groups x 16 - 30sec duration.   Flexibility R/L Right;Left       Right score -6       Left score -5.5       Lower Body Strength 4       Home Exercise No No No No No   Other Exercise Comments no formal HEP no formal HEP no formal HEP no formal HEP no formal HEP, is going to start attending the senior center       EXERCISE EDUCATION CLASSES:      09/24/2021     7:00 AM 10/28/2021     7:00 AM 10/31/2021     7:00 AM 12/05/2021     7:00 AM   EXERCISE EDUCATION   Exercise Education Safe Exercise Parameters Exercise Prescription;Safe Exercise Parameters;Strength Training Other Other   Attend date 09/24/2021 10/28/2021 10/31/2021 12/05/2021   Other comments contraindications to exerise  benefits of cardiac rehab understanidng METS           NUTRITION ASSESSMENT    NUTRITION GOALS:      10/03/2021    11:00 AM 10/28/2021    10:42 AM 10/31/2021    10:45 AM 11/21/2021    10:45 AM 12/13/2021     7:00 AM 01/08/2022    10:45 AM 02/03/2022    10:40 AM   NUTRITION GOALS   Patient goal to meet with RD for 1:1 consult to meet with RD for 1:1 consult Goal #1: Achieve a healthy body weight (New)    Goal #2: Attain optimal blood chemistries: non-fasting LDL, fasting lipid profile, fasting glucose and A1C (New)    Goal #3: Establish healthy eating patterns to meet nutrient needs (New) Goal #1: Achieve a healthy body weight (New)    Goal #2: Attain optimal blood chemistries: non-fasting LDL, fasting lipid profile, fasting glucose and A1C (New)    Goal #3: Establish healthy eating patterns to meet nutrient needs (New) Goal #1: Achieve a healthy body weight (New)    Goal #2: Attain optimal blood chemistries: non-fasting LDL, fasting lipid profile, fasting glucose and A1C (New)  Goal #3: Establish healthy eating patterns to meet nutrient needs (New) Goal #1: Achieve a healthy body weight (New)    Goal #2: Attain optimal blood chemistries: non-fasting LDL, fasting lipid profile, fasting glucose and A1C (New)    Goal #3: Establish healthy eating patterns to meet nutrient needs (New) Goal #1: Achieve a healthy body weight (New)    Goal #2: Attain optimal blood chemistries: non-fasting LDL, fasting lipid profile, fasting glucose and A1C (New)    Goal #3: Establish healthy eating patterns to meet nutrient needs (New)   Other Goals Patient will lose 1-2 lbs. per week if BMI is greater than 25;Patient will decrease waist circumference Patient will lose 1-2 lbs. per week if BMI is greater than 25;Patient will decrease waist circumference Patient will lose 1-2 lbs. per week if BMI is greater than 25;Patient will improve nutrition assessment score;Patient will decrease waist circumference;Patient will increase daily water intake Patient will lose 1-2 lbs. per week if BMI is greater than 25;Patient will improve nutrition assessment score;Patient will decrease waist circumference;Patient will increase daily water intake Patient will lose 1-2 lbs. per week if BMI is greater than 25;Patient will improve nutrition assessment score;Patient will decrease waist circumference;Patient will increase daily water intake Patient will lose 1-2 lbs. per week if BMI is greater than 25;Patient will improve nutrition assessment score;Patient will decrease waist circumference;Patient will increase daily water intake Patient will lose 1-2 lbs. per week if BMI is greater than 25;Patient will improve nutrition assessment score;Patient will decrease waist circumference;Patient will increase daily water intake   Progress towards goal In progress;Verbalizes understanding;Other In progress;Verbalizes understanding;Other In progress In progress In progress In progress In progress   Goal progress comment will meet with RD for 1:1 consult will meet with RD for 1:1 consult  has met with RD for 1:1 consult has met with RD for 1:1 consult has met with RD for 1:1 consult has met with RD for 1:1 consult       NUTRITION INTERVENTIONS:      10/03/2021    11:00 AM 10/28/2021    10:42 AM 10/31/2021    10:45 AM 11/21/2021    10:45 AM 12/13/2021     7:00 AM 01/08/2022    10:45 AM 02/03/2022    10:40 AM   NUTRITION INTERVENTIONS   Interventions Attend individual consultation with the RD Attend individual consultation with the RD Educate the patient on healthy weight loss;Monitor weight at each exercise session;Attend individual consultation with the RD;Educate the patient on the importance of proper hydration;Educate the patient on correlation between heart disease and cholesterol status Educate the patient on healthy weight loss;Monitor weight at each exercise session;Attend individual consultation with the RD;Educate the patient on the importance of proper hydration;Educate the patient on correlation between heart disease and cholesterol status Educate the patient on healthy weight loss;Monitor weight at each exercise session;Attend individual consultation with the RD;Educate the patient on the importance of proper hydration;Educate the patient on correlation between heart disease and cholesterol status Educate the patient on healthy weight loss;Monitor weight at each exercise session;Attend individual consultation with the RD;Educate the patient on the importance of proper hydration;Educate the patient on correlation between heart disease and cholesterol status Educate the patient on healthy weight loss;Monitor weight at each exercise session;Attend individual consultation with the RD;Educate the patient on the importance of proper hydration;Educate the patient on correlation between heart disease and cholesterol status       NUTRITION ASSESSMENT:      01/20/2022  10:34 AM 01/22/2022    10:45 AM 01/23/2022    10:32 AM 01/27/2022    10:38 AM 01/29/2022    10:45 AM 01/30/2022     7:00 AM 02/03/2022    10:40 AM   NUTRITION ASSESSMENT   Weight 240 lb 242 lb 239 lb 239 lb 240 lb 243 lb 241 lb   Weight Management       Desires weight loss;Desires dietary guidance   Oz of water daily       48   Other Nutrition Comments       Pt will need further sessions w/ RD.       NUTRITION EDUCATION CLASSES:      10/10/2021     7:00 AM 10/31/2021     7:00 AM   NUTRITION EDUCATION   Nutrition Education Low Sodium;One to One Consult Healthy Eating - AHA Guidelines;One to One Consult   Attend date 10/03/2021 10/31/2021   Other comments referral to RD for 1:1 consult            PSYCHOSOCIAL ASSESSMENT    PSYCHOSOCIAL ASSESSMENT:      10/03/2021    11:00 AM 10/28/2021    10:42 AM 11/21/2021    10:45 AM 12/13/2021     7:00 AM 01/08/2022    10:45 AM 02/03/2022    10:40 AM   PSYCHOSOCIAL ASSESSEMENT   Psychosocial Method/Tool PHQ-9 PHQ-9       Psychosocial Score 9 9       Quality of Life Method/Tool Ferrans and powers        Quality of Life Score 16.11        Depression Method/Tool PHQ-9        Depression Score 9        Self-Reported Stress medium medium medium medium medium medium   Current State On meds On meds On meds On meds On meds On meds   Other Psychosocial Comments takes trazadone for sleep, initial assessment completed, reports no s/s of depression/anxiety takes trazadone for sleep,  reports no s/s of depression/anxiety takes trazadone for sleep,  reports no s/s of depression/anxiety takes trazadone for sleep,  reports no s/s of depression/anxiety takes trazadone for sleep,  reports no s/s of depression/anxiety takes trazadone for sleep,  reports no s/s of depression/anxiety       PSYCHOSOCIAL INTERVENTIONS:      10/03/2021    11:00 AM 10/28/2021    10:42 AM 11/21/2021    10:45 AM 12/13/2021     7:00 AM 02/03/2022    10:40 AM   PSYCHOSOCIAL INTERVENTIONS   Interventions Attend stress management class;Instruct in relaxation techniques and coping skills Attend stress management class;Instruct in relaxation techniques and coping skills Attend stress management class;Instruct in relaxation techniques and coping skills Attend stress management class;Instruct in relaxation techniques and coping skills Attend stress management class;Instruct in relaxation techniques and coping skills       PSYCHOSOCIAL EDUCATION CLASSES:      11/21/2021     7:00 AM 11/28/2021     7:00 AM 12/05/2021     7:00 AM 12/19/2021     7:00 AM 01/09/2022     7:00 AM 01/23/2022     7:00 AM 01/30/2022     7:00 AM   PSYCHOSOCIAL EDUCATION   Psychosocial Education Stress management;Relaxation Techniques Relaxation Techniques Relaxation Techniques Relaxation Techniques Relaxation Techniques Relaxation Techniques Relaxation Techniques   Attend date 11/21/2021 11/28/2021 12/05/2021 12/19/2021 01/09/2022 01/23/2022 01/30/2022   Other comments mindfulness  PMR             OTHER CORE COMPONENTS    BLOOD PRESSURE:      10/03/2021    11:00 AM 10/28/2021    10:42 AM 12/13/2021     7:00 AM 01/08/2022    10:45 AM 02/03/2022    10:40 AM   BLOOD PRESSURE   Patient Goal to get to goal of 130/80 to get to goal of 130/80 to get to goal of 130/80 to get to goal of 130/80 to get to goal of 130/80   Other Goals Patient will maintain appropriate blood pressure readings less than 130/80 Patient will maintain appropriate blood pressure readings less than 130/80 Patient will maintain appropriate blood pressure readings less than 130/80 Patient will maintain appropriate blood pressure readings less than 130/80 Patient will maintain appropriate blood pressure readings less than 130/80   Interventions Monitor blood pressure at each session;Educate on importance of taking prescribed blood pressure medication correctly;Educate on importance of regular exercise program;Educate on importance of maintaining appropriate diet and weight Monitor blood pressure at each session;Educate on importance of taking prescribed blood pressure medication correctly;Educate on importance of regular exercise program;Educate on importance of maintaining appropriate diet and weight Monitor blood pressure at each session;Educate on importance of taking prescribed blood pressure medication correctly;Educate on importance of regular exercise program;Educate on importance of maintaining appropriate diet and weight Monitor blood pressure at each session;Educate on importance of taking prescribed blood pressure medication correctly;Educate on importance of regular exercise program;Educate on importance of maintaining appropriate diet and weight Monitor blood pressure at each session;Educate on importance of taking prescribed blood pressure medication correctly;Educate on importance of regular exercise program;Educate on importance of maintaining appropriate diet and weight   Progress towards goal In progress;Verbalizes understanding In progress;Verbalizes understanding In progress;Verbalizes understanding In progress;Verbalizes understanding In progress;Verbalizes understanding   Goal progress comment initial goals set and interventions initiated AEB BP at goal at today's visit AEB BP at goal on most rehab days AEB BP at goal on most rehab days AEB BP at goal on most rehab days     Most recent BP: 134/80        DIABETES:      10/03/2021    11:00 AM 10/28/2021    10:42 AM 12/13/2021     7:00 AM 01/08/2022    10:45 AM 02/03/2022    10:40 AM   DIABETES   Patient goal to lower HgbA1C to lower HgbA1C to lower HgbA1C to lower HgbA1C to lower HgbA1C   Other Goals Patient will maintain an acceptable blood sugar range Patient will maintain an acceptable blood sugar range Patient will maintain an acceptable blood sugar range Patient will maintain an acceptable blood sugar range Patient will maintain an acceptable blood sugar range   Interventions Educate patient on compliance with home blood sugar monitoring as prescribed by physician;Educate patient on signs and symptoms of hypo- and hyperglycemia;Instruct patient to monitor blood sugar before and after exercise Educate patient on compliance with home blood sugar monitoring as prescribed by physician;Educate patient on signs and symptoms of hypo- and hyperglycemia;Instruct patient to monitor blood sugar before and after exercise Educate patient on compliance with home blood sugar monitoring as prescribed by physician;Educate patient on signs and symptoms of hypo- and hyperglycemia;Instruct patient to monitor blood sugar before and after exercise Educate patient on compliance with home blood sugar monitoring as prescribed by physician;Educate patient on signs and symptoms of hypo- and hyperglycemia;Instruct patient to monitor blood sugar  before and after exercise Educate patient on compliance with home blood sugar monitoring as prescribed by physician;Educate patient on signs and symptoms of hypo- and hyperglycemia;Instruct patient to monitor blood sugar before and after exercise   HbA1c 8.3 8.3 8.3 8.3 8.3   Blood sugar 144 161      Check BS at home? Yes Yes Yes Yes Yes   Progress towards goal In progress;Verbalizes understanding;Notify MD In progress;Verbalizes understanding;Notify MD In progress;Verbalizes understanding;Notify MD In progress;Verbalizes understanding;Notify MD In progress;Verbalizes understanding;Notify MD   Progress towards goal - Comments message to Dr. Hollice Espy regarding diabetes education referral       Goal progress comment initial goals set and interventions initiated AEB patient will meet with RD for 1:1 consult AEB pt will continue to follow up with RD AEB pt will continue to follow up with RD AEB pt will continue to follow up with RD       HEART FAILURE:      10/03/2021    11:00 AM 10/28/2021    10:42 AM 12/13/2021     7:00 AM 01/08/2022    10:45 AM 02/03/2022    10:40 AM   HEART FAILURE   Patient goal at goal on entry at goal on entry at goal on entry at goal on entry at goal on entry   EF % 55 55 55 55 55   Progress towards goal Yes Yes Yes Yes Yes   Goal progress comment no dx of HF no dx of HF no dx of HF no dx of HF no dx of HF       MEDICATION COMPREHENSION:      10/03/2021    11:00 AM 10/28/2021    10:42 AM 11/21/2021    10:45 AM 12/13/2021     7:00 AM 01/08/2022    10:45 AM 02/03/2022    10:40 AM   MEDICATION COMPREHENSION   Patient goal to decrease need for nitroglycerin to decrease need for nitroglycerin to decrease need for nitroglycerin to decrease need for nitroglycerin to decrease need for nitroglycerin to decrease need for nitroglycerin   Other Goals Patient will demonstrate knowledge of importance of taking meds as evidenced by verbalizing understanding of class, dosage, frequency and side effects. Patient will demonstrate knowledge of importance of taking meds as evidenced by verbalizing understanding of class, dosage, frequency and side effects. Patient will demonstrate knowledge of importance of taking meds as evidenced by verbalizing understanding of class, dosage, frequency and side effects. Patient will demonstrate knowledge of importance of taking meds as evidenced by verbalizing understanding of class, dosage, frequency and side effects. Patient will demonstrate knowledge of importance of taking meds as evidenced by verbalizing understanding of class, dosage, frequency and side effects. Patient will demonstrate knowledge of importance of taking meds as evidenced by verbalizing understanding of class, dosage, frequency and side effects.   Interventions Educate the patient on prescription and importance of time of day taken;Educate the patient on the class of medication, action and side effects of medication;Review dosage and frequency of medication with the patient Educate the patient on prescription and importance of time of day taken;Educate the patient on the class of medication, action and side effects of medication;Review dosage and frequency of medication with the patient Educate the patient on prescription and importance of time of day taken;Educate the patient on the class of medication, action and side effects of medication;Review dosage and frequency of medication with the patient Educate the patient on prescription and importance of time of  day taken;Educate the patient on the class of medication, action and side effects of medication;Review dosage and frequency of medication with the patient Educate the patient on prescription and importance of time of day taken;Educate the patient on the class of medication, action and side effects of medication;Review dosage and frequency of medication with the patient Educate the patient on prescription and importance of time of day taken;Educate the patient on the class of medication, action and side effects of medication;Review dosage and frequency of medication with the patient   Medications Understood Yes Yes Yes Yes Yes Yes   Meds taken % of time 100 100 100 100 100 100   Progress towards goal In progress;Verbalizes understanding In progress;Verbalizes understanding In progress;Verbalizes understanding In progress;Verbalizes understanding In progress;Verbalizes understanding In progress;Verbalizes understanding   Goal progress comment AEB initial goals set and interventions initiated AEB patient starting cardiac rehab AEB patient reports decreased symptoms AEB patient reports decreased symptoms AEB patient reports decreased symptoms AEB patient reports decreased symptoms       TOBACCO CESSATION:      10/03/2021    11:00 AM 10/28/2021    10:42 AM 11/21/2021    10:45 AM 12/13/2021     7:00 AM 01/08/2022    10:45 AM 02/03/2022    10:40 AM   TOBACCO CESSATION   Patient goal at goal on entry at goal on entry at goal on entry at goal on entry at goal on entry at goal on entry   Progress towards goal Yes Yes Yes Yes Yes Yes   Goal progress comment AEB does not use tobacco products AEB does not use tobacco products AEB does not use tobacco products AEB does not use tobacco products AEB does not use tobacco products AEB does not use tobacco products       OTHER CORE COMPONENTS COMMENTS:      10/03/2021    11:00 AM 10/28/2021    10:42 AM 11/21/2021    10:45 AM 12/13/2021     7:00 AM 01/08/2022    10:45 AM 02/03/2022    10:40 AM   OTHER COMMENTS   Other comments CTO left main CTO left main  CTO left main telemetry- wide QRS telemetry- wide QRS   Goal progress comment initial goals set and interventions intiated AEB initiated cardiac rehab AEB progressing workloads and reports no symptoms AEB progressing workloads and reports no symptoms AEB progressing workloads and reports no symptoms AEB progressing workloads and reports no symptoms       OTHER CORE COMPONENTS EDUCATION CLASSES:      10/10/2021     7:00 AM 11/07/2021     7:00 AM 11/28/2021     7:00 AM 12/19/2021     7:00 AM 01/09/2022     7:00 AM 01/23/2022     7:00 AM 01/29/2022     7:00 AM OTHER CORE COMPONENT EDUCATION   Core Component Ed Diabetes;Heart Anatomy;Medications;MI and Stoke;Risk Factors Risk factors MI and Stroke Heart anatomy Heart failure Risk factors Heart anatomy   Attend Date 10/03/2021 11/07/2021 11/28/2021 12/19/2021 01/09/2022 01/23/2022 01/29/2022   Other Comments 1:1   arrythmias   the heart and how it works           Signed:  Pilar Plate, RN  02/03/2022 4:18 PM

## 2022-02-04 NOTE — Unmapped (Signed)
I have reviewed and approved patient's revised ITP for continuation in the cardiac rehab program with noted changes to their exercise prescription and therapy goals.     Menelik Mcfarren, MD, PhD  [facility]

## 2022-02-05 NOTE — Unmapped (Signed)
Does the patient report any symptoms since last visit ?  no  Does the patient report any medication changes since last visit ? no   Pain assessment completed in pre-exercise flow sheet.  Contraindications identified? no       Patient tolerated 15 minutes of group functional conditioning: balance training and/or strength training exercises with no complaints;  15 minutes of 1:1 individualized education in regard to disease management- patient verbalizes and demonstrates understanding.      Appropriate response to exercise session: yes   Exercise session goals :  Individual exercise goal achieved : yes        Patient attended group or individual education  yes    Family present:no  Learning tools:  Written material : no  Audio / Visual :yes  Hands on / models no  Discussion / lecture: yes    Patient verbalizes understanding and teaching goal met  yes

## 2022-02-06 NOTE — Unmapped (Signed)
Does the patient report any symptoms since last visit ?  no  Does the patient report any medication changes since last visit ? no   Pain assessment completed in pre-exercise flow sheet.  Contraindications identified? no       Patient tolerated 15 minutes of group functional conditioning: balance training and/or strength training exercises with no complaints;  15 minutes of 1:1 individualized education in regard to disease management- patient verbalizes and demonstrates understanding.      Appropriate response to exercise session: yes   Exercise session goals :  Individual exercise goal achieved : yes        Patient attended group or individual education  yes    Family present:no  Learning tools:  Written material : no  Audio / Visual :yes  Hands on / models yes  Discussion / lecture: no    Patient verbalizes understanding and teaching goal met  yes

## 2022-02-11 NOTE — Unmapped (Signed)
CARDIAC REHABILITATION   INDIVIDUALIZED TREATMENT PLAN  ITP Phase: Final   Date: 02/06/2022    Patient Name: Jesus Rubio                                           AACVPR Risk: High Risk  Angina Scale: 2+ (reports no CP with moderate exercise)  Diagnosis:    Diagnosis ICD-10-CM Associated Orders   1. 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 Chronic stable angina  I20.89         [name] went from being able to ambulate on the TM to walking a full 30 minutes with incline by the end of his cardiac rehab program.  He also reports experiencing less chest pain and feeling overall better and having more energy.  His PHQ-9 decreased from 9 to 4.  He has lost 7 pounds.    Patient Care Team:  [name], MD as PCP - General (Family Medicine)  [name], [place] as PCP - Shonna Chock, Encarnacion Chu, MD as Attending Provider (Psychiatry)  Eldred Manges, MD as Consulting Physician (Cardiology)  [name], MD as Consulting Physician (Cardiology)  Excell Seltzer, MD (Nephrology)    Name: Mahd Click Kadlec entry 10/03/21 exit 02/06/22    Angina score  3 2 -33.33%   footage    #DIV/0!   MET in rehab  1.5-1.8 2.4 #VALUE!   SLS 3 tandem 11 tandem #VALUE!   STS - lower body strength  4 10 150.00%   Flexibility  -6 -4 -33.33%   Picture Your Plate   did not comple #VALUE!   Weight  245 238 -2.86%   Water consumed  48 48 0.00%   PHQ 9  9 4  -55.56%   QOL  16.11 did not comple #VALUE!   Education score  60 90 50.00%   SBP 180 141 -21.67%   DBP 78 78 0.00%         Card Rehab Vitals         01/22/2022   01/23/2022   01/27/2022   01/29/2022   01/30/2022   02/03/2022   02/05/2022   02/06/2022      BP 144/62 144/69 136/59 124/75 143/71 134/80 126/70 141/78    Weight 109.8 kg (242 lb) 108.4 kg (239 lb) 108.4 kg (239 lb) 108.9 kg (240 lb) 110.2 kg (243 lb) 109.3 kg (241 lb) 108.9 kg (240 lb) 108 kg (238 lb) EXERCISE PRESCRIPTION    EXERCISE ASSSESSMENT:      10/03/2021    11:00 AM 10/28/2021    10:42 AM 11/21/2021    10:45 AM 12/13/2021     7:00 AM 01/08/2022    10:45 AM 02/03/2022    10:40 AM 02/11/2022     7:00 AM   EXERCISE PRESCRIPTION   ITP Phase Initial 30 day 60 day 60 day 90 days Revised Final   Special Precautions / Protocols Indicated  Continuous telemetry;Diabetic precautions;Fall precautions;Orthopedic precautions;Other Continuous telemetry;Diabetic precautions;Fall precautions;Orthopedic precautions;Other Continuous telemetry;Diabetic precautions;Fall precautions;Orthopedic precautions;Other Continuous telemetry;Diabetic precautions;Fall precautions;Orthopedic precautions;Other Continuous telemetry;Diabetic precautions;Fall precautions;Orthopedic precautions;Other Continuous telemetry;Diabetic precautions;Fall precautions;Orthopedic precautions;Other   Supplemental O2 Required?  No No No No No No   Type of exercise test  GXT        GXT (  MET Level)  1.7              EXERCISE GOALS:      10/10/2021     7:00 AM 10/28/2021    10:42 AM 11/21/2021    10:45 AM 12/13/2021     7:00 AM 01/08/2022    10:45 AM 02/03/2022    10:40 AM 02/11/2022     7:00 AM   EXERCISE GOALS   Patient goal will complete exercise assessment at next visit to increase stamina and strength to increase stamina and strength to increase stamina and strength to increase stamina and strength to increase stamina and strength to increase stamina and strength   Other Goals  Patient will demonstrate an ability to take their own pulse;Patient will demonstrate an understanding of their exercise prescription;Patient will demonstrate knowledge of safe exercise parameters;Patient will increase the duration and/or intensity of exercise;Increase MET level as appropriate Patient will demonstrate an ability to take their own pulse;Patient will demonstrate an understanding of their exercise prescription;Patient will demonstrate knowledge of safe exercise parameters;Patient will increase the duration and/or intensity of exercise;Increase MET level as appropriate Patient will demonstrate an ability to take their own pulse;Patient will demonstrate an understanding of their exercise prescription;Patient will demonstrate knowledge of safe exercise parameters;Patient will increase the duration and/or intensity of exercise;Increase MET level as appropriate Patient will demonstrate an ability to take their own pulse;Patient will demonstrate an understanding of their exercise prescription;Patient will demonstrate knowledge of safe exercise parameters;Patient will increase the duration and/or intensity of exercise;Increase MET level as appropriate Patient will demonstrate an ability to take their own pulse;Patient will demonstrate an understanding of their exercise prescription;Patient will demonstrate knowledge of safe exercise parameters;Patient will increase the duration and/or intensity of exercise;Increase MET level as appropriate Patient will demonstrate an ability to take their own pulse;Patient will demonstrate an understanding of their exercise prescription;Patient will demonstrate knowledge of safe exercise parameters;Patient will increase the duration and/or intensity of exercise;Increase MET level as appropriate   Goal progress comment  AEB starting cardiac rehab program AEB patient increasing workloads AEB patient increasing workloads and getting on TM AEB patient increasing workloads and getting on TM AEB patient increasing workloads and getting on TM AEB patient increasing workloads and getting on TM   Progress towards goal  In Progress;Verbalizes understanding In Progress;Verbalizes understanding In Progress;Verbalizes understanding In Progress;Verbalizes understanding In Progress;Verbalizes understanding In Progress;Verbalizes understanding       EXERCISE INTERVENTIONS:      10/28/2021    10:42 AM 11/21/2021    10:45 AM 12/13/2021     7:00 AM 01/08/2022    10:45 AM 02/03/2022    10:40 AM 02/11/2022     7:00 AM   EXERCISE INTERVENTIONS   Interventions Educate patient on exercise prescription, THRR, RPE scale, and MET level;Educate patient on normal/abnormal response to exercise;Educate patient on pulse-taking techniques;Orient patient to equipment safety guidelines;Participate in warm-up and cool-down Educate patient on exercise prescription, THRR, RPE scale, and MET level;Educate patient on normal/abnormal response to exercise;Educate patient on pulse-taking techniques;Orient patient to equipment safety guidelines;Participate in warm-up and cool-down Educate patient on exercise prescription, THRR, RPE scale, and MET level;Educate patient on normal/abnormal response to exercise;Educate patient on pulse-taking techniques;Orient patient to equipment safety guidelines;Participate in warm-up and cool-down Educate patient on exercise prescription, THRR, RPE scale, and MET level;Educate patient on normal/abnormal response to exercise;Educate patient on pulse-taking techniques;Orient patient to equipment safety guidelines;Participate in warm-up and cool-down Educate patient on exercise prescription, THRR, RPE scale, and MET  level;Educate patient on normal/abnormal response to exercise;Educate patient on pulse-taking techniques;Orient patient to equipment safety guidelines;Participate in warm-up and cool-down Educate patient on exercise prescription, THRR, RPE scale, and MET level;Educate patient on normal/abnormal response to exercise;Educate patient on pulse-taking techniques;Orient patient to equipment safety guidelines;Participate in warm-up and cool-down         AEROBIC CONDITIONING / EXERCISE PLAN:      10/28/2021    10:42 AM 11/21/2021    10:45 AM 12/13/2021     7:00 AM 01/08/2022    10:45 AM 02/03/2022    10:40 AM 02/11/2022     7:00 AM   AEROBIC CONDITIONING / EXERCISE PLAN   Frequency per day 1x per day 1x per day 1x per day 1x per day 1x per day 1x per day   Frequency per week 2xs weekly Other Other Other Other Other   Aerobic minutes per session 31 - 45 31 - 45 31 - 45 31 - 45 31 - 45 31 - 45   RPE/BORG 3 - 6 3 - 6 3 - 6 3 - 6 3 - 6    THRR Low 95 95 95 95 95 95   THRR High 105 105 105 105 105 105   Determined Predicted HR Rest + 20-30 bpm Rest + 20-30 bpm Rest + 20-30 bpm Rest + 20-30 bpm Rest + 20-30 bpm Rest + 20-30 bpm   Rehab Progression Decrease rest interval duration;Gradually increase frequency and duration;Other Decrease rest interval duration;Gradually increase frequency and duration;Other Decrease rest interval duration;Gradually increase frequency and duration;Other Decrease rest interval duration;Gradually increase frequency and duration;Other Decrease rest interval duration;Gradually increase frequency and duration;Other Decrease rest interval duration;Gradually increase frequency and duration;Other   Progression Comments establish aerobic base of 30 minutes then progress workloads by 2% every 2 weeks progress worloads by 2% bi-weekly progress worloads by 2% bi-weekly progress worloads by 2% bi-weekly progress worloads by 2% bi-weekly progress worloads by 2% bi-weekly       AEROBIC CONDITIONING / MODES OF EXERCISE:      10/28/2021    10:42 AM 11/21/2021    10:45 AM 12/13/2021     7:00 AM 01/08/2022    10:45 AM 02/03/2022    10:40 AM 02/11/2022     7:00 AM   AEROBIC CONDITIONING / MODES OF EXERCISE:   Seated Elliptical Yes Yes Yes Yes Yes Yes   Level 1 19 2 2 2 2    Mets 1.7 1.8 2.1 2.1  2.1   Watts 15 20 30 30 30 30    Treadmill   Yes Yes Yes Yes   Incline   0 0 0.5 0.5   Mets   1.8 1.8 2.4 2.3   MPH   1 1 1.5 1.5       STRENGTH TRAINING / EXERCISE PLAN:      10/28/2021    10:42 AM 11/21/2021    10:45 AM 12/13/2021     7:00 AM 01/08/2022    10:45 AM 02/03/2022    10:40 AM 02/11/2022     7:00 AM   RESISTANCE TRAINING   Resistance Training Yes Yes Yes Yes Yes Yes   Modes theraband;free weights;body weight theraband;free weights;body weight theraband;free weights;body weight theraband;free weights;body weight theraband;free weights;body weight theraband;free weights;body weight   Frequency 2 x week 2 x week 2 x week 2 x week 2 x week 2 x week   Intensity / RPE 3 - 6 3 - 6 3 - 6 3 - 6 3 -  6 3 - 6   Repetitions 8 - 10 10 - 12 10 - 12 10 - 12 10 - 12 10 - 12   Sets 2 2 2 2 2 2    Limitations or precautions orthopedics orthopedics orthopedics orthopedics orthopedics orthopedics   Progression Increase resistance amount when patient successfully completes the prescribed number of  sets and reps with RPE below 3 (1-10 scale) Increase resistance amount when patient successfully completes the prescribed number of  sets and reps with RPE below 3 (1-10 scale) Increase resistance amount when patient successfully completes the prescribed number of  sets and reps with RPE below 3 (1-10 scale) Increase resistance amount when patient successfully completes the prescribed number of  sets and reps with RPE below 3 (1-10 scale) Increase resistance amount when patient successfully completes the prescribed number of  sets and reps with RPE below 3 (1-10 scale) Increase resistance amount when patient successfully completes the prescribed number of  sets and reps with RPE below 3 (1-10 scale)       OTHER TRAINING / EXERCISE PLAN:      10/28/2021    10:42 AM 11/21/2021    10:45 AM 12/13/2021     7:00 AM 01/08/2022    10:45 AM 02/03/2022    10:40 AM 02/11/2022     7:00 AM   OTHER TRAINING   Balance Training Yes - Daily dynamic balance exercises in warm up and cool down phase of aerobic exercise session. Yes - Daily dynamic balance exercises in warm up and cool down phase of aerobic exercise session. Yes - Daily dynamic balance exercises in warm up and cool down phase of aerobic exercise session. Yes - Daily dynamic balance exercises in warm up and cool down phase of aerobic exercise session. Yes - Daily dynamic balance exercises in warm up and cool down phase of aerobic exercise session. Yes - Daily dynamic balance exercises in warm up and cool down phase of aerobic exercise session.   Balance Score 3     11   Flexibility training type static stretching post cardio or resistance training for all major muscle groups x 16 - 30sec duration. static stretching post cardio or resistance training for all major muscle groups x 16 - 30sec duration. static stretching post cardio or resistance training for all major muscle groups x 16 - 30sec duration. static stretching post cardio or resistance training for all major muscle groups x 16 - 30sec duration. static stretching post cardio or resistance training for all major muscle groups x 16 - 30sec duration. static stretching post cardio or resistance training for all major muscle groups x 16 - 30sec duration.   Flexibility R/L Right;Left     Right;Left   Right score -6     -4   Left score -5.5     -4   Lower Body Strength 4     10   Home Exercise No No No No No No   Other Exercise Comments no formal HEP no formal HEP no formal HEP no formal HEP no formal HEP, is going to [facility] attending the senior center walking at home some and joining the senior center with his wife       EXERCISE EDUCATION CLASSES:      09/24/2021     7:00 AM 10/28/2021     7:00 AM 10/31/2021     7:00 AM 12/05/2021     7:00 AM 02/11/2022     7:00 AM   EXERCISE EDUCATION  Exercise Education Safe Exercise Parameters Exercise Prescription;Safe Exercise Parameters;Strength Training Other Other Exercise for Life;Home Exercise Plan   Attend date 09/24/2021 10/28/2021 10/31/2021 12/05/2021 02/06/2022   Other comments contraindications to exerise  benefits of cardiac rehab understanidng METS            NUTRITION ASSESSMENT    NUTRITION GOALS:      10/28/2021    10:42 AM 10/31/2021    10:45 AM 11/21/2021    10:45 AM 12/13/2021     7:00 AM 01/08/2022    10:45 AM 02/03/2022    10:40 AM 02/11/2022     7:00 AM   NUTRITION GOALS   Patient goal to meet with RD for 1:1 consult Goal #1: Achieve a healthy body weight (New)    Goal #2: Attain optimal blood chemistries: non-fasting LDL, fasting lipid profile, fasting glucose and A1C (New)    Goal #3: Establish healthy eating patterns to meet nutrient needs (New) Goal #1: Achieve a healthy body weight (New)    Goal #2: Attain optimal blood chemistries: non-fasting LDL, fasting lipid profile, fasting glucose and A1C (New)    Goal #3: Establish healthy eating patterns to meet nutrient needs (New) Goal #1: Achieve a healthy body weight (New)    Goal #2: Attain optimal blood chemistries: non-fasting LDL, fasting lipid profile, fasting glucose and A1C (New)    Goal #3: Establish healthy eating patterns to meet nutrient needs (New) Goal #1: Achieve a healthy body weight (New)    Goal #2: Attain optimal blood chemistries: non-fasting LDL, fasting lipid profile, fasting glucose and A1C (New)    Goal #3: Establish healthy eating patterns to meet nutrient needs (New) Goal #1: Achieve a healthy body weight (New)    Goal #2: Attain optimal blood chemistries: non-fasting LDL, fasting lipid profile, fasting glucose and A1C (New)    Goal #3: Establish healthy eating patterns to meet nutrient needs (New) Goal #1: Achieve a healthy body weight (New)    Goal #2: Attain optimal blood chemistries: non-fasting LDL, fasting lipid profile, fasting glucose and A1C (New)    Goal #3: Establish healthy eating patterns to meet nutrient needs (New)   Other Goals Patient will lose 1-2 lbs. per week if BMI is greater than 25;Patient will decrease waist circumference Patient will lose 1-2 lbs. per week if BMI is greater than 25;Patient will improve nutrition assessment score;Patient will decrease waist circumference;Patient will increase daily water intake Patient will lose 1-2 lbs. per week if BMI is greater than 25;Patient will improve nutrition assessment score;Patient will decrease waist circumference;Patient will increase daily water intake Patient will lose 1-2 lbs. per week if BMI is greater than 25;Patient will improve nutrition assessment score;Patient will decrease waist circumference;Patient will increase daily water intake Patient will lose 1-2 lbs. per week if BMI is greater than 25;Patient will improve nutrition assessment score;Patient will decrease waist circumference;Patient will increase daily water intake Patient will lose 1-2 lbs. per week if BMI is greater than 25;Patient will improve nutrition assessment score;Patient will decrease waist circumference;Patient will increase daily water intake Patient will lose 1-2 lbs. per week if BMI is greater than 25;Patient will improve nutrition assessment score;Patient will decrease waist circumference;Patient will increase daily water intake   Progress towards goal In progress;Verbalizes understanding;Other In progress In progress In progress In progress In progress Yes   Goal progress comment will meet with RD for 1:1 consult  has met with RD for 1:1 consult has met with RD for 1:1 consult has met with  RD for 1:1 consult has met with RD for 1:1 consult AEB by decreased HgbA1C and 7lb weight loss       NUTRITION INTERVENTIONS:      10/28/2021    10:42 AM 10/31/2021    10:45 AM 11/21/2021    10:45 AM 12/13/2021     7:00 AM 01/08/2022    10:45 AM 02/03/2022    10:40 AM 02/11/2022     7:00 AM   NUTRITION INTERVENTIONS   Interventions Attend individual consultation with the RD Educate the patient on healthy weight loss;Monitor weight at each exercise session;Attend individual consultation with the RD;Educate the patient on the importance of proper hydration;Educate the patient on correlation between heart disease and cholesterol status Educate the patient on healthy weight loss;Monitor weight at each exercise session;Attend individual consultation with the RD;Educate the patient on the importance of proper hydration;Educate the patient on correlation between heart disease and cholesterol status Educate the patient on healthy weight loss;Monitor weight at each exercise session;Attend individual consultation with the RD;Educate the patient on the importance of proper hydration;Educate the patient on correlation between heart disease and cholesterol status Educate the patient on healthy weight loss;Monitor weight at each exercise session;Attend individual consultation with the RD;Educate the patient on the importance of proper hydration;Educate the patient on correlation between heart disease and cholesterol status Educate the patient on healthy weight loss;Monitor weight at each exercise session;Attend individual consultation with the RD;Educate the patient on the importance of proper hydration;Educate the patient on correlation between heart disease and cholesterol status Educate the patient on healthy weight loss;Monitor weight at each exercise session;Attend individual consultation with the RD;Educate the patient on the importance of proper hydration;Educate the patient on correlation between heart disease and cholesterol status       NUTRITION ASSESSMENT:      01/27/2022    10:38 AM 01/29/2022    10:45 AM 01/30/2022     7:00 AM 02/03/2022    10:40 AM 02/05/2022    10:32 AM 02/06/2022    10:45 AM 02/11/2022     7:00 AM   NUTRITION ASSESSMENT   Weight 239 lb 240 lb 243 lb 241 lb 240 lb 238 lb    Waist Circumference       46   Weight Management    Desires weight loss;Desires dietary guidance   Desires weight loss;Desires dietary guidance   Oz of water daily    48   48   Other Nutrition Comments    Pt will need further sessions w/ RD.   Continue with weight loss goals       NUTRITION EDUCATION CLASSES:      10/10/2021     7:00 AM 10/31/2021     7:00 AM   NUTRITION EDUCATION   Nutrition Education Low Sodium;One to One Consult Healthy Eating - AHA Guidelines;One to One Consult   Attend date 10/03/2021 10/31/2021   Other comments referral to RD for 1:1 consult            PSYCHOSOCIAL ASSESSMENT    PSYCHOSOCIAL ASSESSMENT:      10/03/2021    11:00 AM 10/28/2021    10:42 AM 11/21/2021    10:45 AM 12/13/2021     7:00 AM 01/08/2022    10:45 AM 02/03/2022    10:40 AM 02/11/2022     7:00 AM   PSYCHOSOCIAL ASSESSEMENT   Psychosocial Method/Tool PHQ-9 PHQ-9     PHQ-9   Psychosocial Score 9 9     4  Quality of Life Method/Tool [employer] and powers         Quality of Life Score 16.11         Depression Method/Tool PHQ-9         Depression Score 9         Self-Reported Stress medium medium medium medium medium medium medium   Current State On meds On meds On meds On meds On meds On meds On meds   Other Psychosocial Comments takes trazadone for sleep, initial assessment completed, reports no s/s of depression/anxiety takes trazadone for sleep,  reports no s/s of depression/anxiety takes trazadone for sleep,  reports no s/s of depression/anxiety takes trazadone for sleep,  reports no s/s of depression/anxiety takes trazadone for sleep,  reports no s/s of depression/anxiety takes trazadone for sleep,  reports no s/s of depression/anxiety takes trazadone for sleep,  reports no s/s of depression/anxiety       PSYCHOSOCIAL INTERVENTIONS:      10/03/2021    11:00 AM 10/28/2021    10:42 AM 11/21/2021    10:45 AM 12/13/2021     7:00 AM 02/03/2022    10:40 AM 02/11/2022     7:00 AM   PSYCHOSOCIAL INTERVENTIONS   Interventions Attend stress management class;Instruct in relaxation techniques and coping skills Attend stress management class;Instruct in relaxation techniques and coping skills Attend stress management class;Instruct in relaxation techniques and coping skills Attend stress management class;Instruct in relaxation techniques and coping skills Attend stress management class;Instruct in relaxation techniques and coping skills Attend stress management class;Instruct in relaxation techniques and coping skills       PSYCHOSOCIAL EDUCATION CLASSES:      11/28/2021     7:00 AM 12/05/2021     7:00 AM 12/19/2021     7:00 AM 01/09/2022     7:00 AM 01/23/2022     7:00 AM 01/30/2022     7:00 AM 02/06/2022     7:00 AM   PSYCHOSOCIAL EDUCATION   Psychosocial Education Relaxation Techniques Relaxation Techniques Relaxation Techniques Relaxation Techniques Relaxation Techniques Relaxation Techniques Stress management   Attend date 11/28/2021 12/05/2021 12/19/2021 01/09/2022 01/23/2022 01/30/2022 02/06/2022   Other comments      PMR              OTHER CORE COMPONENTS    BLOOD PRESSURE:      10/03/2021    11:00 AM 10/28/2021    10:42 AM 12/13/2021     7:00 AM 01/08/2022    10:45 AM 02/03/2022    10:40 AM 02/11/2022     7:00 AM   BLOOD PRESSURE   Patient Goal to get to goal of 130/80 to get to goal of 130/80 to get to goal of 130/80 to get to goal of 130/80 to get to goal of 130/80 to get to goal of 130/80   Other Goals Patient will maintain appropriate blood pressure readings less than 130/80 Patient will maintain appropriate blood pressure readings less than 130/80 Patient will maintain appropriate blood pressure readings less than 130/80 Patient will maintain appropriate blood pressure readings less than 130/80 Patient will maintain appropriate blood pressure readings less than 130/80 Patient will maintain appropriate blood pressure readings less than 130/80   Interventions Monitor blood pressure at each session;Educate on importance of taking prescribed blood pressure medication correctly;Educate on importance of regular exercise program;Educate on importance of maintaining appropriate diet and weight Monitor blood pressure at each session;Educate on importance of taking prescribed blood pressure medication correctly;Educate on importance of  regular exercise program;Educate on importance of maintaining appropriate diet and weight Monitor blood pressure at each session;Educate on importance of taking prescribed blood pressure medication correctly;Educate on importance of regular exercise program;Educate on importance of maintaining appropriate diet and weight Monitor blood pressure at each session;Educate on importance of taking prescribed blood pressure medication correctly;Educate on importance of regular exercise program;Educate on importance of maintaining appropriate diet and weight Monitor blood pressure at each session;Educate on importance of taking prescribed blood pressure medication correctly;Educate on importance of regular exercise program;Educate on importance of maintaining appropriate diet and weight Monitor blood pressure at each session;Educate on importance of taking prescribed blood pressure medication correctly;Educate on importance of regular exercise program;Educate on importance of maintaining appropriate diet and weight   Progress towards goal In progress;Verbalizes understanding In progress;Verbalizes understanding In progress;Verbalizes understanding In progress;Verbalizes understanding In progress;Verbalizes understanding No   Goal progress comment initial goals set and interventions initiated AEB BP at goal at today's visit AEB BP at goal on most rehab days AEB BP at goal on most rehab days AEB BP at goal on most rehab days AEB BP average over last 3 visits 134 systolic     Most recent BP: 141/78        DIABETES:      10/03/2021    11:00 AM 10/28/2021    10:42 AM 12/13/2021     7:00 AM 01/08/2022    10:45 AM 02/03/2022    10:40 AM 02/11/2022     7:00 AM   DIABETES   Patient goal to lower HgbA1C to lower HgbA1C to lower HgbA1C to lower HgbA1C to lower HgbA1C to lower HgbA1C   Other Goals Patient will maintain an acceptable blood sugar range Patient will maintain an acceptable blood sugar range Patient will maintain an acceptable blood sugar range Patient will maintain an acceptable blood sugar range Patient will maintain an acceptable blood sugar range Patient will maintain an acceptable blood sugar range   Interventions Educate patient on compliance with home blood sugar monitoring as prescribed by physician;Educate patient on signs and symptoms of hypo- and hyperglycemia;Instruct patient to monitor blood sugar before and after exercise Educate patient on compliance with home blood sugar monitoring as prescribed by physician;Educate patient on signs and symptoms of hypo- and hyperglycemia;Instruct patient to monitor blood sugar before and after exercise Educate patient on compliance with home blood sugar monitoring as prescribed by physician;Educate patient on signs and symptoms of hypo- and hyperglycemia;Instruct patient to monitor blood sugar before and after exercise Educate patient on compliance with home blood sugar monitoring as prescribed by physician;Educate patient on signs and symptoms of hypo- and hyperglycemia;Instruct patient to monitor blood sugar before and after exercise Educate patient on compliance with home blood sugar monitoring as prescribed by physician;Educate patient on signs and symptoms of hypo- and hyperglycemia;Instruct patient to monitor blood sugar before and after exercise Educate patient on compliance with home blood sugar monitoring as prescribed by physician;Educate patient on signs and symptoms of hypo- and hyperglycemia;Instruct patient to monitor blood sugar before and after exercise   HbA1c 8.3 8.3 8.3 8.3 8.3 6.8   Blood sugar 144 161       Check BS at home? Yes Yes Yes Yes Yes Yes   Progress towards goal In progress;Verbalizes understanding;Notify MD In progress;Verbalizes understanding;Notify MD In progress;Verbalizes understanding;Notify MD In progress;Verbalizes understanding;Notify MD In progress;Verbalizes understanding;Notify MD Yes   Progress towards goal - Comments message to Dr. Hollice Espy regarding diabetes education referral  Goal progress comment initial goals set and interventions initiated AEB patient will meet with RD for 1:1 consult AEB pt will continue to follow up with RD AEB pt will continue to follow up with RD AEB pt will continue to follow up with RD AEB lowered HgbA1C to 6.8       HEART FAILURE:      10/03/2021    11:00 AM 10/28/2021    10:42 AM 12/13/2021     7:00 AM 01/08/2022    10:45 AM 02/03/2022    10:40 AM 02/11/2022     7:00 AM HEART FAILURE   Patient goal at goal on entry at goal on entry at goal on entry at goal on entry at goal on entry at goal on entry   EF % 55 55 55 55 55 55   Progress towards goal Yes Yes Yes Yes Yes Yes   Goal progress comment no dx of HF no dx of HF no dx of HF no dx of HF no dx of HF no dx of HF       MEDICATION COMPREHENSION:      10/03/2021    11:00 AM 10/28/2021    10:42 AM 11/21/2021    10:45 AM 12/13/2021     7:00 AM 01/08/2022    10:45 AM 02/03/2022    10:40 AM 02/11/2022     7:00 AM   MEDICATION COMPREHENSION   Patient goal to decrease need for nitroglycerin to decrease need for nitroglycerin to decrease need for nitroglycerin to decrease need for nitroglycerin to decrease need for nitroglycerin to decrease need for nitroglycerin to decrease need for nitroglycerin   Other Goals Patient will demonstrate knowledge of importance of taking meds as evidenced by verbalizing understanding of class, dosage, frequency and side effects. Patient will demonstrate knowledge of importance of taking meds as evidenced by verbalizing understanding of class, dosage, frequency and side effects. Patient will demonstrate knowledge of importance of taking meds as evidenced by verbalizing understanding of class, dosage, frequency and side effects. Patient will demonstrate knowledge of importance of taking meds as evidenced by verbalizing understanding of class, dosage, frequency and side effects. Patient will demonstrate knowledge of importance of taking meds as evidenced by verbalizing understanding of class, dosage, frequency and side effects. Patient will demonstrate knowledge of importance of taking meds as evidenced by verbalizing understanding of class, dosage, frequency and side effects. Patient will demonstrate knowledge of importance of taking meds as evidenced by verbalizing understanding of class, dosage, frequency and side effects.   Interventions Educate the patient on prescription and importance of time of day taken;Educate the patient on the class of medication, action and side effects of medication;Review dosage and frequency of medication with the patient Educate the patient on prescription and importance of time of day taken;Educate the patient on the class of medication, action and side effects of medication;Review dosage and frequency of medication with the patient Educate the patient on prescription and importance of time of day taken;Educate the patient on the class of medication, action and side effects of medication;Review dosage and frequency of medication with the patient Educate the patient on prescription and importance of time of day taken;Educate the patient on the class of medication, action and side effects of medication;Review dosage and frequency of medication with the patient Educate the patient on prescription and importance of time of day taken;Educate the patient on the class of medication, action and side effects of medication;Review dosage and frequency of medication with the patient  Educate the patient on prescription and importance of time of day taken;Educate the patient on the class of medication, action and side effects of medication;Review dosage and frequency of medication with the patient Educate the patient on prescription and importance of time of day taken;Educate the patient on the class of medication, action and side effects of medication;Review dosage and frequency of medication with the patient   Medications Understood Yes Yes Yes Yes Yes Yes Yes   Meds taken % of time 100 100 100 100 100 100 100   Progress towards goal In progress;Verbalizes understanding In progress;Verbalizes understanding In progress;Verbalizes understanding In progress;Verbalizes understanding In progress;Verbalizes understanding In progress;Verbalizes understanding Yes   Goal progress comment AEB initial goals set and interventions initiated AEB patient starting cardiac rehab AEB patient reports decreased symptoms AEB patient reports decreased symptoms AEB patient reports decreased symptoms AEB patient reports decreased symptoms AEB patient reports decreased symptoms       TOBACCO CESSATION:      10/03/2021    11:00 AM 10/28/2021    10:42 AM 11/21/2021    10:45 AM 12/13/2021     7:00 AM 01/08/2022    10:45 AM 02/03/2022    10:40 AM 02/11/2022     7:00 AM   TOBACCO CESSATION   Patient goal at goal on entry at goal on entry at goal on entry at goal on entry at goal on entry at goal on entry at goal on entry   Progress towards goal Yes Yes Yes Yes Yes Yes Yes   Goal progress comment AEB does not use tobacco products AEB does not use tobacco products AEB does not use tobacco products AEB does not use tobacco products AEB does not use tobacco products AEB does not use tobacco products AEB does not use tobacco products       OTHER CORE COMPONENTS COMMENTS:      10/03/2021    11:00 AM 10/28/2021    10:42 AM 11/21/2021    10:45 AM 12/13/2021     7:00 AM 01/08/2022    10:45 AM 02/03/2022    10:40 AM 02/11/2022     7:00 AM   OTHER COMMENTS   Other comments CTO left main CTO left main  CTO left main telemetry- wide QRS telemetry- wide QRS telemetry- wide QRS   Goal progress comment initial goals set and interventions intiated AEB initiated cardiac rehab AEB progressing workloads and reports no symptoms AEB progressing workloads and reports no symptoms AEB progressing workloads and reports no symptoms AEB progressing workloads and reports no symptoms AEB reports minimal chest pain if any       OTHER CORE COMPONENTS EDUCATION CLASSES:      11/07/2021     7:00 AM 11/28/2021     7:00 AM 12/19/2021     7:00 AM 01/09/2022     7:00 AM 01/23/2022     7:00 AM 01/29/2022     7:00 AM 02/05/2022     7:00 AM   OTHER CORE COMPONENT EDUCATION   Core Component Ed Risk factors MI and Stroke Heart anatomy Heart failure Risk factors Heart anatomy Risk factors   Attend Date 11/07/2021 11/28/2021 12/19/2021 01/09/2022 01/23/2022 01/29/2022 02/05/2022   Other Comments arrythmias   the heart and how it works understanding cholesterol           Signed:  Pilar Plate, RN  02/11/2022 10:57 AM

## 2022-02-11 NOTE — Unmapped (Addendum)
CARDIAC REHABILITATION   INDIVIDUALIZED TREATMENT PLAN  ITP Phase: Final   Date: 02/06/2022    Patient Name: Jesus Rubio                                           AACVPR Risk: High Risk  Angina Scale: 2+ (reports no CP with moderate exercise)  Diagnosis:    Diagnosis ICD-10-CM Associated Orders   1. 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 Chronic stable angina  I20.89         Jesus Rubio went from being able to ambulate on the TM to walking a full 30 minutes with incline by the end of his cardiac rehab program.  He also reports experiencing less chest pain and feeling overall better and having more energy.  His PHQ-9 decreased from 9 to 4.  He has lost 7 pounds.    Patient Care Team:  Jesus Sic, Rubio as PCP - General (Family Medicine)  Jesus Rubio, [place] as PCP - Jesus Rubio as Attending Provider (Psychiatry)  Eldred Manges, Rubio as Consulting Physician (Cardiology)  [name], Rubio as Consulting Physician (Cardiology)  Excell Seltzer, Rubio (Nephrology)    Name: Jesus Rubio entry 10/03/21 exit 02/06/22    Angina score  3 2 -33.33%   footage    #DIV/0!   MET in rehab  1.5-1.8 2.4 #VALUE!   SLS 3 tandem 11 tandem #VALUE!   STS - lower body strength  4 10 150.00%   Flexibility  -6 -4 -33.33%   Picture Your Plate   did not comple #VALUE!   Weight  245 238 -2.86%   Water consumed  48 48 0.00%   PHQ 9  9 4  -55.56%   QOL  16.11 did not comple #VALUE!   Education score  60 90 50.00%   SBP 180 141 -21.67%   DBP 78 78 0.00%         Card Rehab Vitals         01/22/2022   01/23/2022   01/27/2022   01/29/2022   01/30/2022   02/03/2022   02/05/2022   02/06/2022      BP 144/62 144/69 136/59 124/75 143/71 134/80 126/70 141/78    Weight 109.8 kg (242 lb) 108.4 kg (239 lb) 108.4 kg (239 lb) 108.9 kg (240 lb) 110.2 kg (243 lb) 109.3 kg (241 lb) 108.9 kg (240 lb) 108 kg (238 lb) EXERCISE PRESCRIPTION    EXERCISE ASSSESSMENT:      10/03/2021    11:00 AM 10/28/2021    10:42 AM 11/21/2021    10:45 AM 12/13/2021     7:00 AM 01/08/2022    10:45 AM 02/03/2022    10:40 AM 02/11/2022     7:00 AM   EXERCISE PRESCRIPTION   ITP Phase Initial 30 day 60 day 60 day 90 days Revised Final   Special Precautions / Protocols Indicated  Continuous telemetry;Diabetic precautions;Fall precautions;Orthopedic precautions;Other Continuous telemetry;Diabetic precautions;Fall precautions;Orthopedic precautions;Other Continuous telemetry;Diabetic precautions;Fall precautions;Orthopedic precautions;Other Continuous telemetry;Diabetic precautions;Fall precautions;Orthopedic precautions;Other Continuous telemetry;Diabetic precautions;Fall precautions;Orthopedic precautions;Other Continuous telemetry;Diabetic precautions;Fall precautions;Orthopedic precautions;Other   Supplemental O2 Required?  No No No No No No   Type of exercise test  GXT        GXT (  MET Level)  1.7              EXERCISE GOALS:      10/10/2021     7:00 AM 10/28/2021    10:42 AM 11/21/2021    10:45 AM 12/13/2021     7:00 AM 01/08/2022    10:45 AM 02/03/2022    10:40 AM 02/11/2022     7:00 AM   EXERCISE GOALS   Patient goal will complete exercise assessment at next visit to increase stamina and strength to increase stamina and strength to increase stamina and strength to increase stamina and strength to increase stamina and strength to increase stamina and strength   Other Goals  Patient will demonstrate an ability to take their own pulse;Patient will demonstrate an understanding of their exercise prescription;Patient will demonstrate knowledge of safe exercise parameters;Patient will increase the duration and/or intensity of exercise;Increase MET level as appropriate Patient will demonstrate an ability to take their own pulse;Patient will demonstrate an understanding of their exercise prescription;Patient will demonstrate knowledge of safe exercise parameters;Patient will increase the duration and/or intensity of exercise;Increase MET level as appropriate Patient will demonstrate an ability to take their own pulse;Patient will demonstrate an understanding of their exercise prescription;Patient will demonstrate knowledge of safe exercise parameters;Patient will increase the duration and/or intensity of exercise;Increase MET level as appropriate Patient will demonstrate an ability to take their own pulse;Patient will demonstrate an understanding of their exercise prescription;Patient will demonstrate knowledge of safe exercise parameters;Patient will increase the duration and/or intensity of exercise;Increase MET level as appropriate Patient will demonstrate an ability to take their own pulse;Patient will demonstrate an understanding of their exercise prescription;Patient will demonstrate knowledge of safe exercise parameters;Patient will increase the duration and/or intensity of exercise;Increase MET level as appropriate Patient will demonstrate an ability to take their own pulse;Patient will demonstrate an understanding of their exercise prescription;Patient will demonstrate knowledge of safe exercise parameters;Patient will increase the duration and/or intensity of exercise;Increase MET level as appropriate   Goal progress comment  AEB starting cardiac rehab program AEB patient increasing workloads AEB patient increasing workloads and getting on TM AEB patient increasing workloads and getting on TM AEB patient increasing workloads and getting on TM AEB patient increasing workloads and getting on TM   Progress towards goal  In Progress;Verbalizes understanding In Progress;Verbalizes understanding In Progress;Verbalizes understanding In Progress;Verbalizes understanding In Progress;Verbalizes understanding In Progress;Verbalizes understanding       EXERCISE INTERVENTIONS:      10/28/2021    10:42 AM 11/21/2021    10:45 AM 12/13/2021     7:00 AM 01/08/2022    10:45 AM 02/03/2022    10:40 AM 02/11/2022     7:00 AM   EXERCISE INTERVENTIONS   Interventions Educate patient on exercise prescription, THRR, RPE scale, and MET level;Educate patient on normal/abnormal response to exercise;Educate patient on pulse-taking techniques;Orient patient to equipment safety guidelines;Participate in warm-up and cool-down Educate patient on exercise prescription, THRR, RPE scale, and MET level;Educate patient on normal/abnormal response to exercise;Educate patient on pulse-taking techniques;Orient patient to equipment safety guidelines;Participate in warm-up and cool-down Educate patient on exercise prescription, THRR, RPE scale, and MET level;Educate patient on normal/abnormal response to exercise;Educate patient on pulse-taking techniques;Orient patient to equipment safety guidelines;Participate in warm-up and cool-down Educate patient on exercise prescription, THRR, RPE scale, and MET level;Educate patient on normal/abnormal response to exercise;Educate patient on pulse-taking techniques;Orient patient to equipment safety guidelines;Participate in warm-up and cool-down Educate patient on exercise prescription, THRR, RPE scale, and MET  level;Educate patient on normal/abnormal response to exercise;Educate patient on pulse-taking techniques;Orient patient to equipment safety guidelines;Participate in warm-up and cool-down Educate patient on exercise prescription, THRR, RPE scale, and MET level;Educate patient on normal/abnormal response to exercise;Educate patient on pulse-taking techniques;Orient patient to equipment safety guidelines;Participate in warm-up and cool-down         AEROBIC CONDITIONING / EXERCISE PLAN:      10/28/2021    10:42 AM 11/21/2021    10:45 AM 12/13/2021     7:00 AM 01/08/2022    10:45 AM 02/03/2022    10:40 AM 02/11/2022     7:00 AM   AEROBIC CONDITIONING / EXERCISE PLAN   Frequency per day 1x per day 1x per day 1x per day 1x per day 1x per day 1x per day   Frequency per week 2xs weekly Other Other Other Other Other   Aerobic minutes per session 31 - 45 31 - 45 31 - 45 31 - 45 31 - 45 31 - 45   RPE/BORG 3 - 6 3 - 6 3 - 6 3 - 6 3 - 6    THRR Low 95 95 95 95 95 95   THRR High 105 105 105 105 105 105   Determined Predicted HR Rest + 20-30 bpm Rest + 20-30 bpm Rest + 20-30 bpm Rest + 20-30 bpm Rest + 20-30 bpm Rest + 20-30 bpm   Rehab Progression Decrease rest interval duration;Gradually increase frequency and duration;Other Decrease rest interval duration;Gradually increase frequency and duration;Other Decrease rest interval duration;Gradually increase frequency and duration;Other Decrease rest interval duration;Gradually increase frequency and duration;Other Decrease rest interval duration;Gradually increase frequency and duration;Other Decrease rest interval duration;Gradually increase frequency and duration;Other   Progression Comments establish aerobic base of 30 minutes then progress workloads by 2% every 2 weeks progress worloads by 2% bi-weekly progress worloads by 2% bi-weekly progress worloads by 2% bi-weekly progress worloads by 2% bi-weekly progress worloads by 2% bi-weekly       AEROBIC CONDITIONING / MODES OF EXERCISE:      10/28/2021    10:42 AM 11/21/2021    10:45 AM 12/13/2021     7:00 AM 01/08/2022    10:45 AM 02/03/2022    10:40 AM 02/11/2022     7:00 AM   AEROBIC CONDITIONING / MODES OF EXERCISE:   Seated Elliptical Yes Yes Yes Yes Yes Yes   Level 1 19 2 2 2 2    Mets 1.7 1.8 2.1 2.1  2.1   Watts 15 20 30 30 30 30    Treadmill   Yes Yes Yes Yes   Incline   0 0 0.5 0.5   Mets   1.8 1.8 2.4 2.3   MPH   1 1 1.5 1.5       STRENGTH TRAINING / EXERCISE PLAN:      10/28/2021    10:42 AM 11/21/2021    10:45 AM 12/13/2021     7:00 AM 01/08/2022    10:45 AM 02/03/2022    10:40 AM 02/11/2022     7:00 AM   RESISTANCE TRAINING   Resistance Training Yes Yes Yes Yes Yes Yes   Modes theraband;free weights;body weight theraband;free weights;body weight theraband;free weights;body weight theraband;free weights;body weight theraband;free weights;body weight theraband;free weights;body weight   Frequency 2 x week 2 x week 2 x week 2 x week 2 x week 2 x week   Intensity / RPE 3 - 6 3 - 6 3 - 6 3 - 6 3 -  6 3 - 6   Repetitions 8 - 10 10 - 12 10 - 12 10 - 12 10 - 12 10 - 12   Sets 2 2 2 2 2 2    Limitations or precautions orthopedics orthopedics orthopedics orthopedics orthopedics orthopedics   Progression Increase resistance amount when patient successfully completes the prescribed number of  sets and reps with RPE below 3 (1-10 scale) Increase resistance amount when patient successfully completes the prescribed number of  sets and reps with RPE below 3 (1-10 scale) Increase resistance amount when patient successfully completes the prescribed number of  sets and reps with RPE below 3 (1-10 scale) Increase resistance amount when patient successfully completes the prescribed number of  sets and reps with RPE below 3 (1-10 scale) Increase resistance amount when patient successfully completes the prescribed number of  sets and reps with RPE below 3 (1-10 scale) Increase resistance amount when patient successfully completes the prescribed number of  sets and reps with RPE below 3 (1-10 scale)       OTHER TRAINING / EXERCISE PLAN:      10/28/2021    10:42 AM 11/21/2021    10:45 AM 12/13/2021     7:00 AM 01/08/2022    10:45 AM 02/03/2022    10:40 AM 02/11/2022     7:00 AM   OTHER TRAINING   Balance Training Yes - Daily dynamic balance exercises in warm up and cool down phase of aerobic exercise session. Yes - Daily dynamic balance exercises in warm up and cool down phase of aerobic exercise session. Yes - Daily dynamic balance exercises in warm up and cool down phase of aerobic exercise session. Yes - Daily dynamic balance exercises in warm up and cool down phase of aerobic exercise session. Yes - Daily dynamic balance exercises in warm up and cool down phase of aerobic exercise session. Yes - Daily dynamic balance exercises in warm up and cool down phase of aerobic exercise session.   Balance Score 3     11   Flexibility training type static stretching post cardio or resistance training for all major muscle groups x 16 - 30sec duration. static stretching post cardio or resistance training for all major muscle groups x 16 - 30sec duration. static stretching post cardio or resistance training for all major muscle groups x 16 - 30sec duration. static stretching post cardio or resistance training for all major muscle groups x 16 - 30sec duration. static stretching post cardio or resistance training for all major muscle groups x 16 - 30sec duration. static stretching post cardio or resistance training for all major muscle groups x 16 - 30sec duration.   Flexibility R/L Right;Left     Right;Left   Right score -6     -4   Left score -5.5     -4   Lower Body Strength 4     10   Home Exercise No No No No No No   Other Exercise Comments no formal HEP no formal HEP no formal HEP no formal HEP no formal HEP, is going to [facility] attending the [facility] walking at home some and joining the senior center with his wife       EXERCISE EDUCATION CLASSES:      09/24/2021     7:00 AM 10/28/2021     7:00 AM 10/31/2021     7:00 AM 12/05/2021     7:00 AM 02/11/2022     7:00 AM   EXERCISE EDUCATION  Exercise Education Safe Exercise Parameters Exercise Prescription;Safe Exercise Parameters;Strength Training Other Other Exercise for Life;Home Exercise Plan   Attend date 09/24/2021 10/28/2021 10/31/2021 12/05/2021 02/06/2022   Other comments contraindications to exerise  benefits of cardiac rehab understanidng METS            NUTRITION ASSESSMENT    NUTRITION GOALS:      10/28/2021    10:42 AM 10/31/2021    10:45 AM 11/21/2021    10:45 AM 12/13/2021     7:00 AM 01/08/2022    10:45 AM 02/03/2022    10:40 AM 02/11/2022     7:00 AM   NUTRITION GOALS   Patient goal to meet with RD for 1:1 consult Goal #1: Achieve a healthy body weight (New)    Goal #2: Attain optimal blood chemistries: non-fasting LDL, fasting lipid profile, fasting glucose and A1C (New)    Goal #3: Establish healthy eating patterns to meet nutrient needs (New) Goal #1: Achieve a healthy body weight (New)    Goal #2: Attain optimal blood chemistries: non-fasting LDL, fasting lipid profile, fasting glucose and A1C (New)    Goal #3: Establish healthy eating patterns to meet nutrient needs (New) Goal #1: Achieve a healthy body weight (New)    Goal #2: Attain optimal blood chemistries: non-fasting LDL, fasting lipid profile, fasting glucose and A1C (New)    Goal #3: Establish healthy eating patterns to meet nutrient needs (New) Goal #1: Achieve a healthy body weight (New)    Goal #2: Attain optimal blood chemistries: non-fasting LDL, fasting lipid profile, fasting glucose and A1C (New)    Goal #3: Establish healthy eating patterns to meet nutrient needs (New) Goal #1: Achieve a healthy body weight (New)    Goal #2: Attain optimal blood chemistries: non-fasting LDL, fasting lipid profile, fasting glucose and A1C (New)    Goal #3: Establish healthy eating patterns to meet nutrient needs (New) Goal #1: Achieve a healthy body weight (New)    Goal #2: Attain optimal blood chemistries: non-fasting LDL, fasting lipid profile, fasting glucose and A1C (New)    Goal #3: Establish healthy eating patterns to meet nutrient needs (New)   Other Goals Patient will lose 1-2 lbs. per week if BMI is greater than 25;Patient will decrease waist circumference Patient will lose 1-2 lbs. per week if BMI is greater than 25;Patient will improve nutrition assessment score;Patient will decrease waist circumference;Patient will increase daily water intake Patient will lose 1-2 lbs. per week if BMI is greater than 25;Patient will improve nutrition assessment score;Patient will decrease waist circumference;Patient will increase daily water intake Patient will lose 1-2 lbs. per week if BMI is greater than 25;Patient will improve nutrition assessment score;Patient will decrease waist circumference;Patient will increase daily water intake Patient will lose 1-2 lbs. per week if BMI is greater than 25;Patient will improve nutrition assessment score;Patient will decrease waist circumference;Patient will increase daily water intake Patient will lose 1-2 lbs. per week if BMI is greater than 25;Patient will improve nutrition assessment score;Patient will decrease waist circumference;Patient will increase daily water intake Patient will lose 1-2 lbs. per week if BMI is greater than 25;Patient will improve nutrition assessment score;Patient will decrease waist circumference;Patient will increase daily water intake   Progress towards goal In progress;Verbalizes understanding;Other In progress In progress In progress In progress In progress Yes   Goal progress comment will meet with RD for 1:1 consult  has met with RD for 1:1 consult has met with RD for 1:1 consult has met with  RD for 1:1 consult has met with RD for 1:1 consult AEB by decreased HgbA1C and 7lb weight loss       NUTRITION INTERVENTIONS:      10/28/2021    10:42 AM 10/31/2021    10:45 AM 11/21/2021    10:45 AM 12/13/2021     7:00 AM 01/08/2022    10:45 AM 02/03/2022    10:40 AM 02/11/2022     7:00 AM   NUTRITION INTERVENTIONS   Interventions Attend individual consultation with the RD Educate the patient on healthy weight loss;Monitor weight at each exercise session;Attend individual consultation with the RD;Educate the patient on the importance of proper hydration;Educate the patient on correlation between heart disease and cholesterol status Educate the patient on healthy weight loss;Monitor weight at each exercise session;Attend individual consultation with the RD;Educate the patient on the importance of proper hydration;Educate the patient on correlation between heart disease and cholesterol status Educate the patient on healthy weight loss;Monitor weight at each exercise session;Attend individual consultation with the RD;Educate the patient on the importance of proper hydration;Educate the patient on correlation between heart disease and cholesterol status Educate the patient on healthy weight loss;Monitor weight at each exercise session;Attend individual consultation with the RD;Educate the patient on the importance of proper hydration;Educate the patient on correlation between heart disease and cholesterol status Educate the patient on healthy weight loss;Monitor weight at each exercise session;Attend individual consultation with the RD;Educate the patient on the importance of proper hydration;Educate the patient on correlation between heart disease and cholesterol status Educate the patient on healthy weight loss;Monitor weight at each exercise session;Attend individual consultation with the RD;Educate the patient on the importance of proper hydration;Educate the patient on correlation between heart disease and cholesterol status       NUTRITION ASSESSMENT:      01/27/2022    10:38 AM 01/29/2022    10:45 AM 01/30/2022     7:00 AM 02/03/2022    10:40 AM 02/05/2022    10:32 AM 02/06/2022    10:45 AM 02/11/2022     7:00 AM   NUTRITION ASSESSMENT   Weight 239 lb 240 lb 243 lb 241 lb 240 lb 238 lb    Waist Circumference       46   Weight Management    Desires weight loss;Desires dietary guidance   Desires weight loss;Desires dietary guidance   Oz of water daily    48   48   Other Nutrition Comments    Pt will need further sessions w/ RD.   Continue with weight loss goals       NUTRITION EDUCATION CLASSES:      10/10/2021     7:00 AM 10/31/2021     7:00 AM   NUTRITION EDUCATION   Nutrition Education Low Sodium;One to One Consult Healthy Eating - AHA Guidelines;One to One Consult   Attend date 10/03/2021 10/31/2021   Other comments referral to RD for 1:1 consult            PSYCHOSOCIAL ASSESSMENT    PSYCHOSOCIAL ASSESSMENT:      10/03/2021    11:00 AM 10/28/2021    10:42 AM 11/21/2021    10:45 AM 12/13/2021     7:00 AM 01/08/2022    10:45 AM 02/03/2022    10:40 AM 02/11/2022     7:00 AM   PSYCHOSOCIAL ASSESSEMENT   Psychosocial Method/Tool PHQ-9 PHQ-9     PHQ-9   Psychosocial Score 9 9     4  Quality of Life Method/Tool [employer] and powers         Quality of Life Score 16.11         Depression Method/Tool PHQ-9         Depression Score 9         Self-Reported Stress medium medium medium medium medium medium medium   Current State On meds On meds On meds On meds On meds On meds On meds   Other Psychosocial Comments takes trazadone for sleep, initial assessment completed, reports no s/s of depression/anxiety takes trazadone for sleep,  reports no s/s of depression/anxiety takes trazadone for sleep,  reports no s/s of depression/anxiety takes trazadone for sleep,  reports no s/s of depression/anxiety takes trazadone for sleep,  reports no s/s of depression/anxiety takes trazadone for sleep,  reports no s/s of depression/anxiety takes trazadone for sleep,  reports no s/s of depression/anxiety       PSYCHOSOCIAL INTERVENTIONS:      10/03/2021    11:00 AM 10/28/2021    10:42 AM 11/21/2021    10:45 AM 12/13/2021     7:00 AM 02/03/2022    10:40 AM 02/11/2022     7:00 AM   PSYCHOSOCIAL INTERVENTIONS   Interventions Attend stress management class;Instruct in relaxation techniques and coping skills Attend stress management class;Instruct in relaxation techniques and coping skills Attend stress management class;Instruct in relaxation techniques and coping skills Attend stress management class;Instruct in relaxation techniques and coping skills Attend stress management class;Instruct in relaxation techniques and coping skills Attend stress management class;Instruct in relaxation techniques and coping skills       PSYCHOSOCIAL EDUCATION CLASSES:      11/28/2021     7:00 AM 12/05/2021     7:00 AM 12/19/2021     7:00 AM 01/09/2022     7:00 AM 01/23/2022     7:00 AM 01/30/2022     7:00 AM 02/06/2022     7:00 AM   PSYCHOSOCIAL EDUCATION   Psychosocial Education Relaxation Techniques Relaxation Techniques Relaxation Techniques Relaxation Techniques Relaxation Techniques Relaxation Techniques Stress management   Attend date 11/28/2021 12/05/2021 12/19/2021 01/09/2022 01/23/2022 01/30/2022 02/06/2022   Other comments      PMR              OTHER CORE COMPONENTS    BLOOD PRESSURE:      10/03/2021    11:00 AM 10/28/2021    10:42 AM 12/13/2021     7:00 AM 01/08/2022    10:45 AM 02/03/2022    10:40 AM 02/11/2022     7:00 AM   BLOOD PRESSURE   Patient Goal to get to goal of 130/80 to get to goal of 130/80 to get to goal of 130/80 to get to goal of 130/80 to get to goal of 130/80 to get to goal of 130/80   Other Goals Patient will maintain appropriate blood pressure readings less than 130/80 Patient will maintain appropriate blood pressure readings less than 130/80 Patient will maintain appropriate blood pressure readings less than 130/80 Patient will maintain appropriate blood pressure readings less than 130/80 Patient will maintain appropriate blood pressure readings less than 130/80 Patient will maintain appropriate blood pressure readings less than 130/80   Interventions Monitor blood pressure at each session;Educate on importance of taking prescribed blood pressure medication correctly;Educate on importance of regular exercise program;Educate on importance of maintaining appropriate diet and weight Monitor blood pressure at each session;Educate on importance of taking prescribed blood pressure medication correctly;Educate on importance of  regular exercise program;Educate on importance of maintaining appropriate diet and weight Monitor blood pressure at each session;Educate on importance of taking prescribed blood pressure medication correctly;Educate on importance of regular exercise program;Educate on importance of maintaining appropriate diet and weight Monitor blood pressure at each session;Educate on importance of taking prescribed blood pressure medication correctly;Educate on importance of regular exercise program;Educate on importance of maintaining appropriate diet and weight Monitor blood pressure at each session;Educate on importance of taking prescribed blood pressure medication correctly;Educate on importance of regular exercise program;Educate on importance of maintaining appropriate diet and weight Monitor blood pressure at each session;Educate on importance of taking prescribed blood pressure medication correctly;Educate on importance of regular exercise program;Educate on importance of maintaining appropriate diet and weight   Progress towards goal In progress;Verbalizes understanding In progress;Verbalizes understanding In progress;Verbalizes understanding In progress;Verbalizes understanding In progress;Verbalizes understanding No   Goal progress comment initial goals set and interventions initiated AEB BP at goal at today's visit AEB BP at goal on most rehab days AEB BP at goal on most rehab days AEB BP at goal on most rehab days AEB BP average over last 3 visits 134 systolic     Most recent BP: 141/78        DIABETES:      10/03/2021    11:00 AM 10/28/2021    10:42 AM 12/13/2021     7:00 AM 01/08/2022    10:45 AM 02/03/2022    10:40 AM 02/11/2022     7:00 AM   DIABETES   Patient goal to lower HgbA1C to lower HgbA1C to lower HgbA1C to lower HgbA1C to lower HgbA1C to lower HgbA1C   Other Goals Patient will maintain an acceptable blood sugar range Patient will maintain an acceptable blood sugar range Patient will maintain an acceptable blood sugar range Patient will maintain an acceptable blood sugar range Patient will maintain an acceptable blood sugar range Patient will maintain an acceptable blood sugar range   Interventions Educate patient on compliance with home blood sugar monitoring as prescribed by physician;Educate patient on signs and symptoms of hypo- and hyperglycemia;Instruct patient to monitor blood sugar before and after exercise Educate patient on compliance with home blood sugar monitoring as prescribed by physician;Educate patient on signs and symptoms of hypo- and hyperglycemia;Instruct patient to monitor blood sugar before and after exercise Educate patient on compliance with home blood sugar monitoring as prescribed by physician;Educate patient on signs and symptoms of hypo- and hyperglycemia;Instruct patient to monitor blood sugar before and after exercise Educate patient on compliance with home blood sugar monitoring as prescribed by physician;Educate patient on signs and symptoms of hypo- and hyperglycemia;Instruct patient to monitor blood sugar before and after exercise Educate patient on compliance with home blood sugar monitoring as prescribed by physician;Educate patient on signs and symptoms of hypo- and hyperglycemia;Instruct patient to monitor blood sugar before and after exercise Educate patient on compliance with home blood sugar monitoring as prescribed by physician;Educate patient on signs and symptoms of hypo- and hyperglycemia;Instruct patient to monitor blood sugar before and after exercise   HbA1c 8.3 8.3 8.3 8.3 8.3 6.8   Blood sugar 144 161       Check BS at home? Yes Yes Yes Yes Yes Yes   Progress towards goal In progress;Verbalizes understanding;Notify Rubio In progress;Verbalizes understanding;Notify Rubio In progress;Verbalizes understanding;Notify Rubio In progress;Verbalizes understanding;Notify Rubio In progress;Verbalizes understanding;Notify Rubio Yes   Progress towards goal - Comments message to Dr. Hollice Espy regarding diabetes education referral  Goal progress comment initial goals set and interventions initiated AEB patient will meet with RD for 1:1 consult AEB pt will continue to follow up with RD AEB pt will continue to follow up with RD AEB pt will continue to follow up with RD AEB lowered HgbA1C to 6.8       HEART FAILURE:      10/03/2021    11:00 AM 10/28/2021    10:42 AM 12/13/2021     7:00 AM 01/08/2022    10:45 AM 02/03/2022    10:40 AM 02/11/2022     7:00 AM HEART FAILURE   Patient goal at goal on entry at goal on entry at goal on entry at goal on entry at goal on entry at goal on entry   EF % 55 55 55 55 55 55   Progress towards goal Yes Yes Yes Yes Yes Yes   Goal progress comment no dx of HF no dx of HF no dx of HF no dx of HF no dx of HF no dx of HF       MEDICATION COMPREHENSION:      10/03/2021    11:00 AM 10/28/2021    10:42 AM 11/21/2021    10:45 AM 12/13/2021     7:00 AM 01/08/2022    10:45 AM 02/03/2022    10:40 AM 02/11/2022     7:00 AM   MEDICATION COMPREHENSION   Patient goal to decrease need for nitroglycerin to decrease need for nitroglycerin to decrease need for nitroglycerin to decrease need for nitroglycerin to decrease need for nitroglycerin to decrease need for nitroglycerin to decrease need for nitroglycerin   Other Goals Patient will demonstrate knowledge of importance of taking meds as evidenced by verbalizing understanding of class, dosage, frequency and side effects. Patient will demonstrate knowledge of importance of taking meds as evidenced by verbalizing understanding of class, dosage, frequency and side effects. Patient will demonstrate knowledge of importance of taking meds as evidenced by verbalizing understanding of class, dosage, frequency and side effects. Patient will demonstrate knowledge of importance of taking meds as evidenced by verbalizing understanding of class, dosage, frequency and side effects. Patient will demonstrate knowledge of importance of taking meds as evidenced by verbalizing understanding of class, dosage, frequency and side effects. Patient will demonstrate knowledge of importance of taking meds as evidenced by verbalizing understanding of class, dosage, frequency and side effects. Patient will demonstrate knowledge of importance of taking meds as evidenced by verbalizing understanding of class, dosage, frequency and side effects.   Interventions Educate the patient on prescription and importance of time of day taken;Educate the patient on the class of medication, action and side effects of medication;Review dosage and frequency of medication with the patient Educate the patient on prescription and importance of time of day taken;Educate the patient on the class of medication, action and side effects of medication;Review dosage and frequency of medication with the patient Educate the patient on prescription and importance of time of day taken;Educate the patient on the class of medication, action and side effects of medication;Review dosage and frequency of medication with the patient Educate the patient on prescription and importance of time of day taken;Educate the patient on the class of medication, action and side effects of medication;Review dosage and frequency of medication with the patient Educate the patient on prescription and importance of time of day taken;Educate the patient on the class of medication, action and side effects of medication;Review dosage and frequency of medication with the patient  Educate the patient on prescription and importance of time of day taken;Educate the patient on the class of medication, action and side effects of medication;Review dosage and frequency of medication with the patient Educate the patient on prescription and importance of time of day taken;Educate the patient on the class of medication, action and side effects of medication;Review dosage and frequency of medication with the patient   Medications Understood Yes Yes Yes Yes Yes Yes Yes   Meds taken % of time 100 100 100 100 100 100 100   Progress towards goal In progress;Verbalizes understanding In progress;Verbalizes understanding In progress;Verbalizes understanding In progress;Verbalizes understanding In progress;Verbalizes understanding In progress;Verbalizes understanding Yes   Goal progress comment AEB initial goals set and interventions initiated AEB patient starting cardiac rehab AEB patient reports decreased symptoms AEB patient reports decreased symptoms AEB patient reports decreased symptoms AEB patient reports decreased symptoms AEB patient reports decreased symptoms       TOBACCO CESSATION:      10/03/2021    11:00 AM 10/28/2021    10:42 AM 11/21/2021    10:45 AM 12/13/2021     7:00 AM 01/08/2022    10:45 AM 02/03/2022    10:40 AM 02/11/2022     7:00 AM   TOBACCO CESSATION   Patient goal at goal on entry at goal on entry at goal on entry at goal on entry at goal on entry at goal on entry at goal on entry   Progress towards goal Yes Yes Yes Yes Yes Yes Yes   Goal progress comment AEB does not use tobacco products AEB does not use tobacco products AEB does not use tobacco products AEB does not use tobacco products AEB does not use tobacco products AEB does not use tobacco products AEB does not use tobacco products       OTHER CORE COMPONENTS COMMENTS:      10/03/2021    11:00 AM 10/28/2021    10:42 AM 11/21/2021    10:45 AM 12/13/2021     7:00 AM 01/08/2022    10:45 AM 02/03/2022    10:40 AM 02/11/2022     7:00 AM   OTHER COMMENTS   Other comments CTO left main CTO left main  CTO left main telemetry- wide QRS telemetry- wide QRS telemetry- wide QRS   Goal progress comment initial goals set and interventions intiated AEB initiated cardiac rehab AEB progressing workloads and reports no symptoms AEB progressing workloads and reports no symptoms AEB progressing workloads and reports no symptoms AEB progressing workloads and reports no symptoms AEB reports minimal chest pain if any       OTHER CORE COMPONENTS EDUCATION CLASSES:      11/07/2021     7:00 AM 11/28/2021     7:00 AM 12/19/2021     7:00 AM 01/09/2022     7:00 AM 01/23/2022     7:00 AM 01/29/2022     7:00 AM 02/05/2022     7:00 AM   OTHER CORE COMPONENT EDUCATION   Core Component Ed Risk factors MI and Stroke Heart anatomy Heart failure Risk factors Heart anatomy Risk factors   Attend Date 11/07/2021 11/28/2021 12/19/2021 01/09/2022 01/23/2022 01/29/2022 02/05/2022   Other Comments arrythmias   the heart and how it works understanding cholesterol           Signed:  Pilar Plate, RN  02/11/2022 10:57 AM

## 2022-02-12 NOTE — Unmapped (Signed)
I have reviewed and approved patient's discharge ITP from the cardiac rehab program with noted changes to their exercise and therapy goals.     Jesus Rubio made excellent progress in cardiac rehab.    [name], MD, PhD  [facility] Cardiology

## 2022-02-13 ENCOUNTER — Ambulatory Visit: Admit: 2022-02-13 | Discharge: 2022-02-14 | Payer: MEDICARE | Attending: Family Medicine | Primary: Family Medicine

## 2022-02-13 DIAGNOSIS — R000 Tachycardia, unspecified: Principal | ICD-10-CM

## 2022-02-13 DIAGNOSIS — I2510 Atherosclerotic heart disease of native coronary artery without angina pectoris: Principal | ICD-10-CM

## 2022-02-13 DIAGNOSIS — I4892 Unspecified atrial flutter: Principal | ICD-10-CM

## 2022-02-13 DIAGNOSIS — R21 Rash and other nonspecific skin eruption: Principal | ICD-10-CM

## 2022-02-13 DIAGNOSIS — R5383 Other fatigue: Principal | ICD-10-CM

## 2022-02-13 DIAGNOSIS — K6289 Other specified diseases of anus and rectum: Principal | ICD-10-CM

## 2022-02-13 LAB — BASIC METABOLIC PANEL
ANION GAP: 9 mmol/L (ref 7–15)
BLOOD UREA NITROGEN: 22 mg/dL — ABNORMAL HIGH (ref 7–21)
BUN / CREAT RATIO: 16
CALCIUM: 9.7 mg/dL (ref 8.5–10.2)
CHLORIDE: 106 mmol/L (ref 98–107)
CO2: 23 mmol/L (ref 22.0–32.0)
CREATININE: 1.4 mg/dL — ABNORMAL HIGH (ref 0.70–1.30)
EGFR CKD-EPI (2021) MALE: 52 mL/min/{1.73_m2} — ABNORMAL LOW (ref >=60)
GLUCOSE RANDOM: 136 mg/dL — ABNORMAL HIGH (ref 74–106)
POTASSIUM: 4.9 mmol/L (ref 3.5–5.0)
SODIUM: 138 mmol/L (ref 135–145)

## 2022-02-13 LAB — CBC W/ AUTO DIFF
BASOPHILS ABSOLUTE COUNT: 0.1 10*9/L (ref 0.0–0.1)
BASOPHILS RELATIVE PERCENT: 0.8 %
EOSINOPHILS ABSOLUTE COUNT: 0.6 10*9/L — ABNORMAL HIGH (ref 0.0–0.5)
EOSINOPHILS RELATIVE PERCENT: 5.1 %
HEMATOCRIT: 37.4 % — ABNORMAL LOW (ref 39.0–48.0)
HEMOGLOBIN: 12 g/dL — ABNORMAL LOW (ref 12.9–16.5)
LYMPHOCYTES ABSOLUTE COUNT: 1.8 10*9/L (ref 1.1–3.6)
LYMPHOCYTES RELATIVE PERCENT: 16.2 %
MEAN CORPUSCULAR HEMOGLOBIN CONC: 32.2 g/dL (ref 32.0–36.0)
MEAN CORPUSCULAR HEMOGLOBIN: 25.9 pg (ref 25.9–32.4)
MEAN CORPUSCULAR VOLUME: 80.4 fL (ref 77.6–95.7)
MEAN PLATELET VOLUME: 8.4 fL (ref 6.8–10.7)
MONOCYTES ABSOLUTE COUNT: 1.1 10*9/L — ABNORMAL HIGH (ref 0.3–0.8)
MONOCYTES RELATIVE PERCENT: 9.8 %
NEUTROPHILS ABSOLUTE COUNT: 7.4 10*9/L (ref 1.8–7.8)
NEUTROPHILS RELATIVE PERCENT: 68.1 %
NUCLEATED RED BLOOD CELLS: 0 /100{WBCs} (ref <=4)
PLATELET COUNT: 362 10*9/L (ref 150–450)
RED BLOOD CELL COUNT: 4.66 10*12/L (ref 4.26–5.60)
RED CELL DISTRIBUTION WIDTH: 19 % — ABNORMAL HIGH (ref 12.2–15.2)
WBC ADJUSTED: 10.8 10*9/L (ref 3.6–11.2)

## 2022-02-13 LAB — HEPATIC FUNCTION PANEL
ALBUMIN: 4.5 g/dL (ref 3.4–5.0)
ALKALINE PHOSPHATASE: 56 U/L (ref 38–126)
ALT (SGPT): 32 U/L (ref <50)
AST (SGOT): 37 U/L (ref 19–55)
BILIRUBIN DIRECT: 0.1 mg/dL (ref 0.00–0.40)
BILIRUBIN TOTAL: 0.5 mg/dL (ref 0.1–1.2)
PROTEIN TOTAL: 7.6 g/dL (ref 6.5–8.3)

## 2022-02-13 LAB — MAGNESIUM: MAGNESIUM: 2 mg/dL (ref 1.6–2.2)

## 2022-02-14 NOTE — Unmapped (Signed)
Assessment/Plan     Diagnoses and all orders for this visit:    Jesus Rubio presented the office today for follow-up regarding his anemia in the setting of coronary artery disease and history of fatigue.  He noted to be significantly fatigued today and on evaluation was noted to be in a rapid irregular rhythm.  EKG demonstrated atrial flutter with heart rates in the 130s.  He was evaluated with stat labs in the office and ongoing clinical monitoring.  He remained stable.  Labs resulted and reviewed with patient and wife.  Given the persistence of his atrial flutter in the 130s it was recommended that he present to the emergency room for evaluation.  Patient and wife did not want to present the emergency room as patient and wife did not feel like there would be much of an intervention beyond what they were aware of previously.  He does have a prescription for diltiazem to take as needed for symptomatic episodes of rapid heartbeat or palpitations.  He does not have this available at this time.  He does have this at home.  He does not appear to be hemodynamically unstable.  Patient was educated around benefits of presenting the emergency room would include further evaluation, more rapid medical stabilization, evaluation for possible need for additional interventions and ongoing monitoring.  Patient and wife wish to proceed home and take diltiazem if not improving to present to emergency room.  Given current otherwise clinical stability in the setting of prolonged wait times in the emergency department and ease of accessibility with patient's home medication by private vehicle this is a reasonable approach.  Patient and wife voiced understanding  of when to further present to the emergency room.  Given today's focus on this acute symptom at presentation closer evaluation of inguinal rash and rectal discomfort which does not sound to be of acute life-threatening etiology was deferred at this time.  His fatigue is most likely secondary to constellation of medical conditions including atrial flutter.    Tachycardia  -     ECG 12 Lead  -     CBC w/ Differential; Future  -     Basic Metabolic Panel; Future  -     Hepatic Function Panel; Future  -     Magnesium Level; Future  -     ECG 12 Lead     Atrial flutter, unspecified type (CMS-HCC)    Coronary artery disease involving native coronary artery of native heart, unspecified whether angina present    Fatigue, unspecified type    Rectal pain    Rash      Disposition: Return in about 2 weeks (around 02/27/2022) for Recheck, Fatigue.  02/24/2022    Over half of 60+ minute visit spent in education, discussion, anticipatory guidance and coordination of care.    Diagnoses and plan along with any newly prescribed medication(s), recommendations, orders and therapies were discussed in detail with patient today.   Standard education and anticipatory guidance regarding goals, objectives and potential sequelae were reviewed. Patient voiced appropriate understanding of assessment and plan of care.    Pertinent handouts were given today and reviewed with the patient as indicated.  The Care Plan and Self-Management goals have been included on the AVS and the AVS has been printed.  Where helpful for monitoring and management I have encouraged the patient to keep regular logs for me to review at their next visit. Any outside resources or referrals needed at this time are noted above. No  additional referrals necessary at this time. Patient voiced understanding and all questions have been answered to satisfaction.     To complete this documentation in a timely manner, portions of this encounter may have been completed utilizing Dragon voice recognition software. Please take this into consideration in noting nuances of documentation, grammar, punctuation, and wording. If and when a question exists, or for clarification, please contact the author.    Barron Alvine, MD  [facility]  [address], [address]  [place], [name] [address]  Phone [phone]    Subjective:     [id number], [name], MD    Jesus Rubio, is a 76 y.o. male    Chief Complaint   Patient presents with    Anemia     Patient states that he is tired and does not have much energy.     Rectal Pain     Patient states that he is having pus like fluid leaking from his butt and blood, states this started after last visit.     Groin Pain     Patient states that he has  been having a itch in his groin area. Does not think it is jock itch    Medication Problem     Patient would like to get back on pregablin because he felt it helped better with neuropathy        HPI:    Patient presents: With wife.    Nursing notes and history reviewed with patient and wife.    Jesus Rubio presents today in follow-up regarding fatigue and anemia.  He notes today he was particular more fatigued than typical.  Wife says they were more active yesterday going out and doing things.  He was extremely tired today.  After getting up and bathing he could not do much other than have a bowl of cereal and then rest.  He denies any chest pain palpitations or dizziness.  No evidence of ongoing blood loss.  No fevers.  No cough.  No evidence of ongoing blood loss.  No abdominal discomfort.  No nausea.    Does note rectal pain at times with possibly some rectal seepage.  Minimal blood sometimes with wiping.  No frank melena.    Groin discomfort with itching.  No treatment interventions.    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  He would like to get back on pregabalin.      No other complaints.  No additional recent health care visits elsewhere. No urgent care or ED visits.  No additional palliative, provocative, modifying, quantifying, qualifying, or other related factors.  Unless otherwise outlined above patient is pleased with current medication regimen, admits compliance and denies side effects of medication.    The following portions of the patient's history were reviewed and updated as appropriate: allergies, current medications, past family history, past medical history, past social history, past surgical history and problem list.    ROS: Pertinent positives and negatives are as outlined in the HPI above. The remaining balance of the 12 point review of systems is otherwise benign.      [facility] Components:      Goals         <130/80       Lack of exercise or physical activity    Increase exercise or physical activity        Get heart rate stabilized (pt-stated)         Things to think about to  help me reach my goal:     What are you going to do? Take medications as prescribed, Keep positive thinking, Pace activities, Stay hydrated   How and how much? Daily   How frequent? Daily   Barriers to success?    Solutions to barriers?      06/04/17: Patient continues to work on this goal   09/29/17 Patient continues to work on this goal   12-09-2017: Reports he continues to work on this and is staying positive.        Take actions to prevent falling       12-03-2016: New goal: Will change positions slowly and pay attention to pathways. Uses cane or walker at times.   09/29/17 Patient continues to work on this goal   12-09-2017: Reports 1 fall 6-8 months ago when he missed a step and fell. Denies injury. Uses cane occasionally.              Family characteristics, patient's social characteristics, patient's cultural characteristics, patient's communication needs, patient's health literacy and behaviors affecting patient's health are outlined under the patient's History Section, Longitudinal Care Plan or Problem List in Epic or as pertinent in History of Present Illness.    Barriers to goals identified and addressed.   See Assessment and Plan for additional pertinent details.    Objective:     Physical Exam   BP 138/72  - Temp 36.9 ??C (98.4 ??F) (Oral)  - Resp 16  - Ht 182.9 cm (6' 0.01)  - Wt (!) 109.3 kg (241 lb)  - SpO2 96%  - BMI 32.68 kg/m??   Constitutional: Oriented to person, place, and time. Appears well-developed and well-nourished. No distress.   Head: Normocephalic and atraumatic.   Ears/Nose/Mouth/Throat: EACs and TMs are benign. Nares is patent without noted abnormality.   Oropharynx is without mass or concerning lesions. Moist mucous membranes.  Eyes: Conjunctivae are normal. No scleral icterus.   Neck: Neck supple. Normal range of motion. No JVD present. No thyromegaly present. No concerning neck mass.   Cardiovascular: Irregular irregular rhythm with a heart rate in the 130s.  Otherwise normal heart sounds. No murmur heard.  Pulmonary/Chest: Effort normal. No respiratory distress. Breath sounds normal. No wheezes. No rhonchi. No rales. No focal breath sound changes. No chest tenderness.  Abdominal: Soft. Non-distended. No abdominal mass. No tenderness. No rebound. No guarding.   Musculoskeletal: No active synovitis. No concerning joint erythema, warmth or swelling.   Range of motion is without evidence of significant impairment.   Strength is without evidence of significant impairment.   No edema of the extremities.   Lymphadenopathy: There is no lymphadenopathy about the head or neck.  Neurological: Alert and oriented to person, place, and time. No cranial nerve deficit. Normal muscle tone. Normal coordination.  Skin: Skin is warm and dry. Pale. No rash noted.   Psychiatric: Normal mood, affect and behavior. Judgment and thought content normal.

## 2022-02-14 NOTE — Unmapped (Signed)
Health Maintenance Due   Topic Date Due    COVID-19 Vaccine (1) Never done    Zoster Vaccines (1 of 2) Never done    DTaP/Tdap/Td Vaccines (1 - Tdap) 04/08/2002    Medicare Annual Wellness Visit (AWV)  01/09/2019    Influenza Vaccine (1) 12/06/2021    Retinal Eye Exam  12/13/2021    Urine Albumin/Creatinine Ratio  02/01/2022           Goals & Recommendations:    Goals and Recommendations related to your health are outlined below.    If discussed and provided at your visit today: Please, keep and record your blood sugar, blood pressure, weight, fluid intake, dietary intake and exercise amount and frequency.     Blood Pressure Goals (unless otherwise instructed):  Check 2-3 times a week.  Typical average out of office measurement: <135 mmHg systolic and < 85 mmHg diastolic (less than 135/85).  More strict recommendations for out of office measurement: < 130 mmHg systolic and < 80 mmHg diastolic (less than 130/80).  Typical in office measurement <140 mmHg systolic and <90 mmHg diastolic (less than 140/90).  Most people will not feel well if blood pressure drops to less than 110/70, unless they typically have blood pressures in this range.   If you are noticing sudden increases or decreases in blood pressure, then please contact the office.    Diabetes: Goal Hemoglobin A1c 7.9 or lower.  Goal Blood Sugars Fasting: 60-140 (may check fasting and two hours after a meal later in the day).    For Monitoring your Mood your PHQ-9 Goal is less than 10.    Drink 48 ounces of water per day.  Avoid sweetened beverages.  You will generally feel better and experience improved health when you limit, and if possible avoid, processed foods.  Diets high in plant based foods, vegetables and fruits are associated with many health benefits.    Exercise for a minimum of 20 minutes daily. Minimum of four days a week.   If possible try to obtain 60 minutes of exercise daily for five or more days per week.  Regular movement throughout the day is best.   Walking is a good form of movement.   Squats, lunges, step-ups, push-ups, planks, and arm exercises with or without weights or resistance bands are good forms of exercise.    If your BMI (body mass index) is above 25 then your body is carrying more weight than is typically healthy.  If your BMI (body mass index) is above 25 and you are not achieving the healthy weight goals that you desire, then consider the following:  Keep calories below 2500 calories per day unless otherwise instructed. Remember that all calories are not equal.  Keep a food journal and record accurately.  Read labels and ingredient lists on the products that you consume.  Sometimes we need to adjust your level of physical activity, and or tailor an exercise program more specifically for your needs, just ask and we will come up with a plan.  If you have questions about any of these recommendations, please ask.    If you smoke, stop.  If you have heart failure or severe kidney disease: weigh yourself daily and maintain fluid intake to less than 2 Liters daily unless otherwise instructed.  If you have diabetes you need to see the eye doctor at least every one to two years and have a yearly foot exam.  Set aside time daily for relaxation.  Plan a pleasant activity that you can look forward to and enjoy.  Medication changes are outlined on your After Visit Summary.  Any referrals placed today will be outlined on your After Visit Summary.    If you have questions, please call the office or use MyChart.   If you need a refill on a medication, please contact your pharmacy.  Please, allow at least two business days for responses to refill requests.    Please, bring all medications and when possible original prescription containers to each and every office visit.    If you have been provided and / or requested to record your blood sugars, blood pressures, weight, fluid intake, dietary intake, or exercise, then please bring records to each and every appointment.    When you see other providers for health care, please request that they send information and records related to your visit to our office. This includes vaccinations, mammograms, radiology, procedures, diagnostic testing, lab testing and physical exams or wellness exams that you have outside of the [facility].      [facility] at [facility]  [address]  [place], [place] [address]  Office: [phone]  Fax: [phone]               If a lab, test, imaging order or referral was placed today it will show on your AVS.    Labs:  Please obtain labs today prior to leaving, unless otherwise instructed.  If you have been asked to obtain labs at a later date, please schedule a Lab Visit appointment.    X-rays (plain films):    Plain film (traditional) x-rays do not require scheduling at [facility] or elsewhere in the [facility] system.  If you had an x-ray ordered today, our office recommends going to get the x-ray performed as soon as you can, unless otherwise instructed.  If you are obtaining X-rays (plain films) outside of the [facility] system, then please verify with your health care provider so that we can assist in coordinating any needed scheduling / appointments.    Referrals (referral for specialty consultation), Imaging (CT, MRI, Ultrasounds, Cardiac Testing, Other Imaging, Spirometry, Sleep Studies):  If there is a number on your AVS today for a location to call, then calling this number is recommended.  If there is not a number to call, then you will be contacted via your listed preferred phone number, text and or MyChart.  If you are signed up for [facility], then some information may be directed to your MyChart account as the first line of contact. For example, [facility] may contact you to schedule an appointment for a referral or test first via your MyChart account. If you have signed up previously for a [facility] MyChart account, though you do not currently access or know how to access, then please let a member of our office staff know. If you would like to set up a [facility] MyChart account please see the instructions near the end of your After Visit Summary. If you need assistance with [facility], you may call the [facility] at [phone].   Phone calls to schedule through [facility] may come from [phone] or [phone] area codes.  If you have not heard regarding scheduling of your referral in the next 7 days, then please contact our office at [phone] option 5.    Insurance Specific Considerations:  Please review information from your  insurance provider regarding scheduling and coverage for specialty care, procedures and imaging.  If you know that your insurance specifically requires a referral or authorization prior to scheduling with a specialty care provider or for a test, please contact our office prior to scheduling at [phone] option 5.    Forms:  We are happy to assist with any forms you may need. We typically complete most forms within 7 business days.  For all forms that you need our office to complete, please review the forms in their entirety first, complete any areas that are required by you including any necessary signatures prior to leaving the forms with our office. Please note that many forms require signatures from patients related to consent for a health care provider or office to complete the form, where required this if often outlined on the forms that you would be submitting. Please also note the time frames for requested return of forms prior to providing to our office as well as any supporting documentation that you may need to provide. The most common reason for delayed completion of forms or return of forms to patients or submissions elsewhere are forms which are not signed by the patient where needed or forms requested for which we do not have supporting documentation or where additional information is needed prior to our completion of the requested forms. Please also note that there are instances beyond our control where forms may not be available within 7 business days.    Refills:  Please allow 3 business days for any refill requests to be processed. Please contact your pharmacy for any refills and to check the status on any refill request. In some instances your insurance may require a prior approval for certain medications. In those cases, refills are processed after we receive approval and this could possibly lengthen the turnaround time.    No Show and Late arrivals:  Please call the office when you need to cancel or reschedule an appointment, not doing so places the appointment as a no-show. If you have 3 or more no shows in a year, you may be asked to locate another provider outside of our office. If you arrive 15 minutes after your appointment time, you may be asked to reschedule.    Phone:  Our phone calls are answered Monday-Friday 8:00 am to 5:00 pm, by a staff member. Calls outside of these times and on weekends, will be answered by a registered nurse with [facility]. If needed, they will reach out to providers on call. Refills are not processed after normal business hours.     Coordination of Care:  When you see other providers for health care, please request that they send information and records related to your visit to our office. This includes vaccinations, mammograms, radiology, procedures, diagnostic testing, lab testing and physical exams or wellness exams that you have outside of the [facility] system. Our contact information is outlined below. Please periodically review your preferred contact information, preferred methods of contact and individuals listed on your contact list in our records. We recommend when possible having at least two phone numbers available for contact. If you have a MyChart account, please access and review your account periodically to update information and check for any new information that may have been sent to you.     Planning for your next visit:  Please bring any needed information to update your insurance, payment records, and preferred contact information.  Updated Medication List including supplements and over the  counter medications.  Any information related to health care visits elsewhere since your last visit, including: urgent cares, emergency departments, specialist offices, or other imaging or procedures.    We Acknowledge:  We acknowledge that while technology has made great progress in allowing [place] to communicate more and has improved some of the connectivity of health care and health care providers, these systems are not perfect. If any questions exist please always ask and if anything is unclear please let us know. In regards to tests, labs, imaging and recommendations for referrals or other procedures from our office, you should always hear something as far as an appointment, a result, a recommendation or a next step.    Billing:  For questions regarding billing please call [phone] option #4.  If you are signed up for [facility], then all billing statements will come to you directly by way of your [facility] MyChart account. Beginning in September 2023, if you are signed up for [facility], you will not receive a mailed paper billing statement from [facility]. If you have signed up previously for a [facility] MyChart account, though you do not currently access or know how to access, then please let a member of our office staff know. If you would like to set up a [facility] MyChart account please see the instructions near the end of your After Visit Summary. If you need assistance with your [facility], you may call the [facility] at [phone].     Inclement Weather:  During times of inclement weather please call the office to check on any changes in hours of operation.    Survey:  Thank you for the chance to participate in your health care.  Soon you should be receiving a survey about your recent visit to [facility].  Please take a moment to complete this survey.  We take your feedback very seriously and will use it to guide our continuous improvement efforts.     [facility] Primary [facility] at [facility]  [address], [address]  [place], [place] [address]  Office: [phone]  Fax: [phone]

## 2022-02-19 DIAGNOSIS — E119 Type 2 diabetes mellitus without complications: Principal | ICD-10-CM

## 2022-02-19 MED ORDER — GLIPIZIDE 5 MG TABLET
ORAL_TABLET | 0 refills | 0 days
Start: 2022-02-19 — End: ?

## 2022-02-19 NOTE — Unmapped (Signed)
[facility] Refill Coordination Note    Specialty Medication(s) to be Shipped:   General Specialty: Repatha    Other medication(s) to be shipped: No additional medications requested for fill at this time     Jesus Rubio, DOB: 1945-07-21  Phone: [phone] (home)       All above HIPAA information was verified with patient's family member, wife.     Was a Nurse, learning disability used for this call? No    Completed refill call assessment today to schedule patient's medication shipment from the [facility] [phone]).  All relevant notes have been reviewed.     Specialty medication(s) and dose(s) confirmed: Regimen is correct and unchanged.   Changes to medications: Jesus Rubio reports no changes at this time.  Changes to insurance: No  New side effects reported not previously addressed with a pharmacist or physician: None reported  Questions for the pharmacist: No    Confirmed patient received a Conservation officer, historic buildings and a Surveyor, mining with first shipment. The patient will receive a drug information handout for each medication shipped and additional FDA Medication Guides as required.       DISEASE/MEDICATION-SPECIFIC INFORMATION        For patients on injectable medications: Patient currently has 0 doses left.  Next injection is scheduled for 03/01/22.    SPECIALTY MEDICATION ADHERENCE     Medication Adherence    Patient reported X missed doses in the last month: 0  Specialty Medication: REPATHA SURECLICK 140 mg/mL Pnij (evolocumab)  Patient is on additional specialty medications: No                                Were doses missed due to medication being on hold? No        REFERRAL TO PHARMACIST     Referral to the pharmacist: Not needed      [facility]     Shipping address confirmed in Epic.     Delivery Scheduled: Yes, Expected medication delivery date: 02/24/22.     Medication will be delivered via Same Day Courier to the prescription address in Epic WAM.    Jesus Rubio [facility] Specialty Technician

## 2022-02-20 NOTE — Unmapped (Signed)
Patient is requesting the following refill  Requested Prescriptions     Pending Prescriptions Disp Refills    glipiZIDE (GLUCOTROL) 5 MG tablet [Pharmacy Med Name: GLIPIZIDE 5 MG TABLET] 90 tablet 0     Sig: TAKE 1 TABLET EVERY MORNING FOR DIABETES       Recent Visits  Date Type Provider Dept   02/13/22 Office Visit [name], MD [place] [facility]   01/16/22 Office Visit [name], MD [place] [facility]   12/19/21 Office Visit [name], MD [place] [facility]   11/13/21 Office Visit [name], MD [place] [facility] At [facility]   10/22/21 Office Visit [name], MD [place] [facility]   08/08/21 Office Visit [name], MD [place] [facility] At [facility]   07/25/21 Office Visit [name], MD [place] [facility]   07/04/21 Office Visit [name], MD [place] [facility] At [facility]   04/05/21 Office Visit [name], [place] [facility]   02/22/21 Office Visit [name], [place]  [facility] At [facility]   Showing recent visits within past 365 days with a meds authorizing provider and meeting all other requirements  Future Appointments  Date Type Provider Dept   02/24/22 Appointment [name], MD  Primary Care At [facility]   Showing future appointments within next 365 days with a meds authorizing provider and meeting all other requirements       Labs: A1c:   Hemoglobin A1C (%)   Date Value   11/13/2021 6.8 (H)   07/04/2021 8.3 (A)    Creatinine:   Creatinine (mg/dL)   Date Value   98/02/9146 1.40 (H)   06/28/2015 1.22

## 2022-02-21 MED ORDER — GLIPIZIDE 5 MG TABLET
ORAL_TABLET | 1 refills | 0 days | Status: CP
Start: 2022-02-21 — End: ?

## 2022-02-24 ENCOUNTER — Ambulatory Visit: Admit: 2022-02-24 | Discharge: 2022-02-25 | Payer: MEDICARE | Attending: Family Medicine | Primary: Family Medicine

## 2022-02-24 DIAGNOSIS — E538 Deficiency of other specified B group vitamins: Principal | ICD-10-CM

## 2022-02-24 MED ORDER — CYANOCOBALAMIN (VIT B-12) 1,000 MCG/ML INJECTION SOLUTION
SUBCUTANEOUS | 1 refills | 300 days | Status: CP
Start: 2022-02-24 — End: ?

## 2022-02-24 MED FILL — REPATHA SURECLICK 140 MG/ML SUBCUTANEOUS PEN INJECTOR: SUBCUTANEOUS | 28 days supply | Qty: 2 | Fill #8

## 2022-02-24 NOTE — Unmapped (Signed)
Health Maintenance Due   Topic Date Due    COVID-19 Vaccine (1) Never done    Zoster Vaccines (1 of 2) Never done    DTaP/Tdap/Td Vaccines (1 - Tdap) 04/08/2002    Medicare Annual Wellness Visit (AWV)  01/09/2019    Influenza Vaccine (1) 12/06/2021    Retinal Eye Exam  12/13/2021    Urine Albumin/Creatinine Ratio  02/01/2022           Goals & Recommendations:    Goals and Recommendations related to your health are outlined below.    If discussed and provided at your visit today: Please, keep and record your blood sugar, blood pressure, weight, fluid intake, dietary intake and exercise amount and frequency.     Blood Pressure Goals (unless otherwise instructed):  Check 2-3 times a week.  Typical average out of office measurement: <135 mmHg systolic and < 85 mmHg diastolic (less than 135/85).  More strict recommendations for out of office measurement: < 130 mmHg systolic and < 80 mmHg diastolic (less than 130/80).  Typical in office measurement <140 mmHg systolic and <90 mmHg diastolic (less than 140/90).  Most people will not feel well if blood pressure drops to less than 110/70, unless they typically have blood pressures in this range.   If you are noticing sudden increases or decreases in blood pressure, then please contact the office.    Diabetes: Goal Hemoglobin A1c 7.9 or lower.  Goal Blood Sugars Fasting: 60-140 (may check fasting and two hours after a meal later in the day).    For Monitoring your Mood your PHQ-9 Goal is less than 10.    Drink 48 ounces of water per day.  Avoid sweetened beverages.  You will generally feel better and experience improved health when you limit, and if possible avoid, processed foods.  Diets high in plant based foods, vegetables and fruits are associated with many health benefits.    Exercise for a minimum of 20 minutes daily. Minimum of four days a week.   If possible try to obtain 60 minutes of exercise daily for five or more days per week.  Regular movement throughout the day is best.   Walking is a good form of movement.   Squats, lunges, step-ups, push-ups, planks, and arm exercises with or without weights or resistance bands are good forms of exercise.    If your BMI (body mass index) is above 25 then your body is carrying more weight than is typically healthy.  If your BMI (body mass index) is above 25 and you are not achieving the healthy weight goals that you desire, then consider the following:  Keep calories below 2500 calories per day unless otherwise instructed. Remember that all calories are not equal.  Keep a food journal and record accurately.  Read labels and ingredient lists on the products that you consume.  Sometimes we need to adjust your level of physical activity, and or tailor an exercise program more specifically for your needs, just ask and we will come up with a plan.  If you have questions about any of these recommendations, please ask.    If you smoke, stop.  If you have heart failure or severe kidney disease: weigh yourself daily and maintain fluid intake to less than 2 Liters daily unless otherwise instructed.  If you have diabetes you need to see the eye doctor at least every one to two years and have a yearly foot exam.  Set aside time daily for relaxation.  Plan a pleasant activity that you can look forward to and enjoy.  Medication changes are outlined on your After Visit Summary.  Any referrals placed today will be outlined on your After Visit Summary.    If you have questions, please call the office or use MyChart.   If you need a refill on a medication, please contact your pharmacy.  Please, allow at least two business days for responses to refill requests.    Please, bring all medications and when possible original prescription containers to each and every office visit.    If you have been provided and / or requested to record your blood sugars, blood pressures, weight, fluid intake, dietary intake, or exercise, then please bring records to each and every appointment.    When you see other providers for health care, please request that they send information and records related to your visit to our office. This includes vaccinations, mammograms, radiology, procedures, diagnostic testing, lab testing and physical exams or wellness exams that you have outside of the [facility].      [facility] at [facility]  [address]  [place], [place] [address]  Office: [phone]  Fax: [phone]               If a lab, test, imaging order or referral was placed today it will show on your AVS.    Labs:  Please obtain labs today prior to leaving, unless otherwise instructed.  If you have been asked to obtain labs at a later date, please schedule a Lab Visit appointment.    X-rays (plain films):    Plain film (traditional) x-rays do not require scheduling at [facility] or elsewhere in the [facility] system.  If you had an x-ray ordered today, our office recommends going to get the x-ray performed as soon as you can, unless otherwise instructed.  If you are obtaining X-rays (plain films) outside of the [facility] system, then please verify with your health care provider so that we can assist in coordinating any needed scheduling / appointments.    Referrals (referral for specialty consultation), Imaging (CT, MRI, Ultrasounds, Cardiac Testing, Other Imaging, Spirometry, Sleep Studies):  If there is a number on your AVS today for a location to call, then calling this number is recommended.  If there is not a number to call, then you will be contacted via your listed preferred phone number, text and or MyChart.  If you are signed up for [facility], then some information may be directed to your MyChart account as the first line of contact. For example, [facility] may contact you to schedule an appointment for a referral or test first via your MyChart account. If you have signed up previously for a [facility] MyChart account, though you do not currently access or know how to access, then please let a member of our office staff know. If you would like to set up a [facility] MyChart account please see the instructions near the end of your After Visit Summary. If you need assistance with [facility], you may call the [facility] at [phone].   Phone calls to schedule through [facility] may come from [phone] or [phone] area codes.  If you have not heard regarding scheduling of your referral in the next 7 days, then please contact our office at [phone] option 5.    Insurance Specific Considerations:  Please review information from your  insurance provider regarding scheduling and coverage for specialty care, procedures and imaging.  If you know that your insurance specifically requires a referral or authorization prior to scheduling with a specialty care provider or for a test, please contact our office prior to scheduling at [phone] option 5.    Forms:  We are happy to assist with any forms you may need. We typically complete most forms within 7 business days.  For all forms that you need our office to complete, please review the forms in their entirety first, complete any areas that are required by you including any necessary signatures prior to leaving the forms with our office. Please note that many forms require signatures from patients related to consent for a health care provider or office to complete the form, where required this if often outlined on the forms that you would be submitting. Please also note the time frames for requested return of forms prior to providing to our office as well as any supporting documentation that you may need to provide. The most common reason for delayed completion of forms or return of forms to patients or submissions elsewhere are forms which are not signed by the patient where needed or forms requested for which we do not have supporting documentation or where additional information is needed prior to our completion of the requested forms. Please also note that there are instances beyond our control where forms may not be available within 7 business days.    Refills:  Please allow 3 business days for any refill requests to be processed. Please contact your pharmacy for any refills and to check the status on any refill request. In some instances your insurance may require a prior approval for certain medications. In those cases, refills are processed after we receive approval and this could possibly lengthen the turnaround time.    No Show and Late arrivals:  Please call the office when you need to cancel or reschedule an appointment, not doing so places the appointment as a no-show. If you have 3 or more no shows in a year, you may be asked to locate another provider outside of our office. If you arrive 15 minutes after your appointment time, you may be asked to reschedule.    Phone:  Our phone calls are answered Monday-Friday 8:00 am to 5:00 pm, by a staff member. Calls outside of these times and on weekends, will be answered by a registered nurse with [facility]. If needed, they will reach out to providers on call. Refills are not processed after normal business hours.     Coordination of Care:  When you see other providers for health care, please request that they send information and records related to your visit to our office. This includes vaccinations, mammograms, radiology, procedures, diagnostic testing, lab testing and physical exams or wellness exams that you have outside of the [facility] system. Our contact information is outlined below. Please periodically review your preferred contact information, preferred methods of contact and individuals listed on your contact list in our records. We recommend when possible having at least two phone numbers available for contact. If you have a MyChart account, please access and review your account periodically to update information and check for any new information that may have been sent to you.     Planning for your next visit:  Please bring any needed information to update your insurance, payment records, and preferred contact information.  Updated Medication List including supplements and over the  counter medications.  Any information related to health care visits elsewhere since your last visit, including: urgent cares, emergency departments, specialist offices, or other imaging or procedures.    We Acknowledge:  We acknowledge that while technology has made great progress in allowing [place] to communicate more and has improved some of the connectivity of health care and health care providers, these systems are not perfect. If any questions exist please always ask and if anything is unclear please let us know. In regards to tests, labs, imaging and recommendations for referrals or other procedures from our office, you should always hear something as far as an appointment, a result, a recommendation or a next step.    Billing:  For questions regarding billing please call [phone] option #4.  If you are signed up for [facility], then all billing statements will come to you directly by way of your [facility] MyChart account. Beginning in September 2023, if you are signed up for [facility], you will not receive a mailed paper billing statement from [facility]. If you have signed up previously for a [facility] MyChart account, though you do not currently access or know how to access, then please let a member of our office staff know. If you would like to set up a [facility] MyChart account please see the instructions near the end of your After Visit Summary. If you need assistance with your [facility], you may call the [facility] at [phone].     Inclement Weather:  During times of inclement weather please call the office to check on any changes in hours of operation.    Survey:  Thank you for the chance to participate in your health care.  Soon you should be receiving a survey about your recent visit to [facility].  Please take a moment to complete this survey.  We take your feedback very seriously and will use it to guide our continuous improvement efforts.     [facility] Primary [facility] at [facility]  [address], [address]  [place], [place] [address]  Office: [phone]  Fax: [phone]

## 2022-02-28 MED ORDER — ONETOUCH VERIO TEST STRIPS
ORAL_STRIP | 1 refills | 0 days
Start: 2022-02-28 — End: ?

## 2022-02-28 NOTE — Unmapped (Signed)
Assessment/Plan     Diagnoses and all orders for this visit:    Fatigue, unspecified type  Improved compared to previous.  Likely multifactorial in etiology.  Standard education guidance.  Continue previous recommendations plan of care.    B12 deficiency  Currently stable.  Continue current plan of care.  -     cyanocobalamin, vitamin B-12, 1,000 mcg/mL injection; Inject 1 mL (1,000 mcg total) under the skin every thirty (30) days.    Atrial flutter, unspecified type (CMS-HCC)  Currently clinically stable.  Continue current plan of care.    Coronary artery disease involving native coronary artery of native heart, unspecified whether angina present  Clinically asymptomatic.   Standard education and anticipatory guidance regarding goals, objectives, and sequelae.  Continue current management and plan of care.  Continue aggressive risk factor reduction.    Rectal pain  Resolved.  Standard education and anticipatory guidance regarding goals, objectives, and sequelae.  Continue to monitor.    Iron deficiency anemia due to chronic blood loss  Reasonably asymptomatic and currently clinically stable.  Standard education guidance.  Continue to monitor  Continue current plan of care.    Peripheral polyneuropathy  Ongoing symptoms.  Standard education guidance.  Shared decision making to continue plan of care.    Disposition: Return in about 2 months (around 04/26/2022) for Recheck, Fatigue, Anemia, CAD, Atrial Fibrillation.  04/28/2022      Diagnoses and plan along with any newly prescribed medication(s), recommendations, orders and therapies were discussed in detail with patient today.   Standard education and anticipatory guidance regarding goals, objectives and potential sequelae were reviewed. Patient voiced appropriate understanding of assessment and plan of care.    Pertinent handouts were given today and reviewed with the patient as indicated.  The Care Plan and Self-Management goals have been included on the AVS and the AVS has been printed.  Where helpful for monitoring and management I have encouraged the patient to keep regular logs for me to review at their next visit. Any outside resources or referrals needed at this time are noted above. No additional referrals necessary at this time. Patient voiced understanding and all questions have been answered to satisfaction.     To complete this documentation in a timely manner, portions of this encounter may have been completed utilizing Dragon voice recognition software. Please take this into consideration in noting nuances of documentation, grammar, punctuation, and wording. If and when a question exists, or for clarification, please contact the author.    Jesus Alvine, MD  [facility]  [address]  [place], [name] [address]  Phone [phone]    Subjective:     Jesus Rubio:NFAOZH, Jesus Beam, MD    Jesus Rubio, is a 76 y.o. male    Chief Complaint   Patient presents with    Fatigue     Patient states that he is feeling better but does still feel a little fatigue.     Medication Problem     Patient wants to discuss changing gabapentin        HPI:    Patient presents: With wife.    Nursing notes and history reviewed with patient and wife.    Jesus Rubio presents with his wife follow-up regarding fatigue with multiple comorbidities conditions.  He was recently seen to have a flutter with rapid ventricular response.  He had some chest discomfort at that time.  He had opted taking extra dose of diltiazem instead of  going to the emergency department.  He says after taking the diltiazem that his symptoms resolved.  He has felt otherwise reasonably well.  He feels as best as he has felt.  Has not had any palpitations dizziness chest pain shortness of breath orthopnea PND or concerning symptoms with duration.  His level of exertion is currently baseline.    B12 deficiency.  Request refill of injection therapy which he is taking feels benefit and denies side effect.    Coronary artery disease.  No increased nitroglycerin use.     Rectal pain.  Not currently clinically significant symptom.    Iron deficiency anemia.  Noted previously.  Prior notes reviewed. Denies abdominal discomfort or evidence of active ongoing blood loss.    Peripheral neuropathy.  Continues to be symptomatic.  Would like to consider additional treatment such as Lyrica.  Given overall constellation of factors shared decision making to hold at this time.    No other complaints.  No additional recent health care visits elsewhere. No urgent care or ED visits.  No additional palliative, provocative, modifying, quantifying, qualifying, or other related factors.  Unless otherwise outlined above patient is pleased with current medication regimen, admits compliance and denies side effects of medication.    The following portions of the patient's history were reviewed and updated as appropriate: allergies, current medications, past family history, past medical history, past social history, past surgical history and problem list.    ROS: Pertinent positives and negatives are as outlined in the HPI above. The remaining balance of the 12 point review of systems is otherwise benign.      PCMH Components:      Goals         <130/80       Lack of exercise or physical activity    Increase exercise or physical activity        Get heart rate stabilized (pt-stated)         Things to think about to help me reach my goal:     What are you going to do? Take medications as prescribed, Keep positive thinking, Pace activities, Stay hydrated   How and how much? Daily   How frequent? Daily   Barriers to success?    Solutions to barriers?      06/04/17: Patient continues to work on this goal   09/29/17 Patient continues to work on this goal   12-09-2017: Reports he continues to work on this and is staying positive.        Take actions to prevent falling       12-03-2016: New goal: Will change positions slowly and pay attention to pathways. Uses cane or walker at times.   09/29/17 Patient continues to work on this goal   12-09-2017: Reports 1 fall 6-8 months ago when he missed a step and fell. Denies injury. Uses cane occasionally.              Family characteristics, patient's social characteristics, patient's cultural characteristics, patient's communication needs, patient's health literacy and behaviors affecting patient's health are outlined under the patient's History Section, Longitudinal Care Plan or Problem List in Epic or as pertinent in History of Present Illness.    Barriers to goals identified and addressed.   See Assessment and Plan for additional pertinent details.    Objective:     Physical Exam   BP 127/78 (BP Site: L Arm)  - Pulse 76  - Temp 36.9 ??C (98.4 ??F) (Oral)  -  Resp 16  - Ht 182.9 cm (6' 0.01)  - Wt (!) 110.5 kg (243 lb 8 oz)  - SpO2 96%  - BMI 33.02 kg/m??   Constitutional: Oriented to person, place, and time. Appears well-developed and well-nourished. No distress.   Head: Normocephalic and atraumatic.   Mouth/Throat: Oropharynx is without mass or concerning lesions. Moist mucous membranes.    Eyes: Conjunctivae are normal. No scleral icterus.   Neck: Neck supple. Normal range of motion. No JVD present. No thyromegaly present. No concerning neck mass.   Cardiovascular: Normal rate. Regular rhythm. Normal heart sounds. No murmur heard.  Pulmonary/Chest: Effort normal. No respiratory distress. Breath sounds normal. No wheezes. No rhonchi. No rales. No focal breath sound changes. No chest tenderness.  Abdominal: Soft. Non-distended. No abdominal mass. No tenderness. No rebound. No guarding.   Musculoskeletal: No active synovitis. No concerning joint erythema, warmth or swelling.   Range of motion is without evidence of significant impairment.   Strength is without evidence of significant impairment.   No edema of the extremities.   Lymphadenopathy: There is no lymphadenopathy about the head or neck.  Neurological: Alert and oriented to person, place, and time. No cranial nerve deficit. Normal muscle tone. Normal coordination.  Skin: Skin is warm and dry. No pallor. No rash noted.   Psychiatric: Normal mood, affect and behavior. Judgment and thought content normal.

## 2022-03-04 MED ORDER — ONETOUCH VERIO TEST STRIPS
ORAL_STRIP | 12 refills | 0 days | Status: CP
Start: 2022-03-04 — End: ?

## 2022-03-04 NOTE — Unmapped (Signed)
Patient is requesting the following refill  Requested Prescriptions     Pending Prescriptions Disp Refills    ONETOUCH VERIO TEST STRIPS Strp [Pharmacy Med Name: ONE TOUCH VERIO TEST STRIP] 100 strip 1     Sig: USE TO CHECK BLOOD SUGAR 1-2 TIMES PER DAY       Recent Visits  Date Type Provider Dept   02/24/22 Office Visit [name], MD [place] [facility]   02/13/22 Office Visit [name], MD [place] [facility] At [facility]   01/16/22 Office Visit [name], MD [place] [facility]   12/19/21 Office Visit [name], MD [place] [facility] At [facility]   11/13/21 Office Visit [name], MD [place] [facility]   10/22/21 Office Visit [name], MD [place] [facility] At [facility]   08/08/21 Office Visit [name], MD [place] [facility] At [facility]   07/25/21 Office Visit [name], MD [place] [facility] At [facility]   07/04/21 Office Visit [name], MD [place] [facility]   04/05/21 Office Visit [name], [place] [facility]   Showing recent visits within past 365 days with a meds authorizing provider and meeting all other requirements  Future Appointments  Date Type Provider Dept   04/28/22 Appointment [name], MD  Primary Care At [facility]   Showing future appointments within next 365 days with a meds authorizing provider and meeting all other requirements       Labs: Not applicable this refill

## 2022-03-22 DIAGNOSIS — D500 Iron deficiency anemia secondary to blood loss (chronic): Principal | ICD-10-CM

## 2022-03-22 MED ORDER — SUCRALFATE 1 GRAM TABLET
ORAL_TABLET | Freq: Three times a day (TID) | ORAL | 1 refills | 30 days | Status: CP
Start: 2022-03-22 — End: 2023-03-22

## 2022-03-22 NOTE — Unmapped (Signed)
Patient is requesting the following refill  Requested Prescriptions     Pending Prescriptions Disp Refills    sucralfate (CARAFATE) 1 gram tablet [Pharmacy Med Name: SUCRALFATE 1 GM TABLET] 90 tablet 1     Sig: TAKE 1 TABLET (1 G TOTAL) BY MOUTH THREE (3) TIMES A DAY. FOR STOMACH PROTECTION DUE TO ANEMIA / HISTORY OF GASTRITIS.       Recent Visits  Date Type Provider Dept   02/24/22 Office Visit [name], MD [place] [facility]   02/13/22 Office Visit [name], MD [place] [facility] At [facility]   01/16/22 Office Visit [name], MD [place] [facility] At [facility]   12/19/21 Office Visit [name], MD [place] [facility]   11/13/21 Office Visit [name], MD [place] [facility] At [facility]   10/22/21 Office Visit [name], MD [place] [facility] At [facility]   08/08/21 Office Visit [name], MD [place] [facility] At [facility]   07/25/21 Office Visit [name], MD [place] [facility] At [facility]   07/04/21 Office Visit [name], MD [place] [facility] At [facility]   04/05/21 Office Visit [name], [place] [facility] At [facility]   Showing recent visits within past 365 days with a meds authorizing provider and meeting all other requirements  Future Appointments  Date Type Provider Dept   04/28/22 Appointment [name], MD [place] [facility] At [facility]   Showing future appointments within next 365 days with a meds authorizing provider and meeting all other requirements

## 2022-03-24 NOTE — Unmapped (Signed)
[facility] Refill Coordination Note    Specialty Medication(s) to be Shipped:   General Specialty: Repatha    Other medication(s) to be shipped: No additional medications requested for fill at this time     Jesus Rubio, DOB: 10/29/1945  Phone: [phone] (home)       All above HIPAA information was verified with patient's family member, wife.     Was a Nurse, learning disability used for this call? No    Completed refill call assessment today to schedule patient's medication shipment from the [facility] [phone]).  All relevant notes have been reviewed.     Specialty medication(s) and dose(s) confirmed: Regimen is correct and unchanged.   Changes to medications: Jesus Rubio reports no changes at this time.  Changes to insurance: No  New side effects reported not previously addressed with a pharmacist or physician: None reported  Questions for the pharmacist: No    Confirmed patient received a Conservation officer, historic buildings and a Surveyor, mining with first shipment. The patient will receive a drug information handout for each medication shipped and additional FDA Medication Guides as required.       DISEASE/MEDICATION-SPECIFIC INFORMATION        For patients on injectable medications: Patient currently has 0 doses left.  Next injection is scheduled for 12/22.    SPECIALTY MEDICATION ADHERENCE     Medication Adherence    Patient reported X missed doses in the last month: 0  Specialty Medication: Repatha 140mg /ml  Patient is on additional specialty medications: No  Patient is on more than two specialty medications: No  Any gaps in refill history greater than 2 weeks in the last 3 months: no  Demonstrates understanding of importance of adherence: yes                          Were doses missed due to medication being on hold? No        REFERRAL TO PHARMACIST     Referral to the pharmacist: Not needed      [facility]     Shipping address confirmed in Epic.     Delivery Scheduled: Yes, Expected medication delivery date: 12/20.     Medication will be delivered via Same Day Courier to the prescription address in Epic WAM.    Jesus Rubio   [facility] Specialty Technician

## 2022-03-26 MED FILL — REPATHA SURECLICK 140 MG/ML SUBCUTANEOUS PEN INJECTOR: SUBCUTANEOUS | 28 days supply | Qty: 2 | Fill #9

## 2022-04-13 NOTE — Unmapped (Signed)
[facility] CARDIOLOGY AT [facility]  [address] - Phone: [phone]  [name], [name] [address] - Fax: [phone]     Date of Service: 04/14/2022    PCP: Jesus Sic, MD  Referring Provider: [name], PA    ASSESSMENT and PLAN:     Jesus Rubio is a 77 y.o. male presenting at the request of Jesus Peggye Form, PA for evaluation and management of:  1. 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 Chronic stable angina    3. Hypertension associated with diabetes (CMS-HCC)    4. Renal artery stenosis (CMS-HCC)    5. Subclavian arterial stenosis (CMS-HCC)    6. Hyperlipidemia associated with type 2 diabetes mellitus (CMS-HCC)    7. Type 2 diabetes mellitus with other specified complication, without long-term current use of insulin (CMS-HCC)    8. Paroxysmal atrial fibrillation (CMS-HCC)    9. Atrial flutter, unspecified type (CMS-HCC)      Jesus Rubio presents in follow up.  He saw his PCP in Nov 2023 with palpitations and was noted to be in rapid atrial flutter. He was referred to the ER but preferred managing at home with prn diltiazem. This was effective. He has not had other episodes and knows when he is irregular rhythm. If he has recurrent episodes we should consider amiodarone. He and his wife asked about stopping aspirin due to increased risk of bleeding. This is very reasonable. He has some blood on the TP occasionally but no frank blood in his stools. No hematuria. His Hgb has risen with iron supplementation. If he has worsening bleeding or drop in Hgb we can stop aspirin and keep him on eliquis monotherapy.    1. 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 NSTEMI while hospitalized for COVID, managed medically with plavix. Plavix was later discontinued in favor of eliquis due to PE  - anti-platelets: plavix 6 months post PCI renal artery stenosis => switch to aspirin 12/2021  - anti-anginal: metoprolol succinate 100mg  BID, imdur 60mg  daily  - statin: atorvastatin 40mg  daily => 10mg  ?muscle aches on higher dose => crestor 20mg  daily => Repatha, stop crestor  - last echo: 05/12/20 normal EF  - last stress/angiography:04/2021 see below. New since 2016 is CTO left main    2. Afib and Atrial flutter s/p ablation x2. Recurrence of paroxysmal Afib during hospitalization for COVID19 03/2020-04/2020. ER visit 02/2021 for symptomatic afib. No clear trigger.  - CHADS2-Vasc score: 6 (HTN, age+2, PE and CVA+2, IHD)  - Anti-coagulation: Eliquis  - Rate control: metoprolol as above, additional 50mg  prn dose if needed for sustained palpitations  - Rhythm control: not indicated    3. COVID19 with lengthy, complicated hospitalization 12/21-1/22. ARDS, NSTEMI, GI bleed, ARF requiring dialysis, occipital lobe CVA, PE  - has SOB with exertion, waiting for home O2    4. History of PE  - on eliquis    5. R subclavian stenosis  - BP in left arm    6. 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     Jesus Harp, MD, PhD  [name]      ORDERS THIS VISIT:     No orders of the defined types were placed in this encounter.     New Prescriptions    No medications on file  Modified Medications    No medications on file      Discontinued Medications    No medications on file        SUBJECTIVE:     ID: Jesus Rubio is a 77 y.o. male with a history of CAD s/p CABG, aflutter s/p ablation, COVID19 with numerous complications during hospitalization 03/2020-04/2020    Reason for Visit: Routine Follow-up (4 month f/u)     Cardiovascular History:    Previously evaluated by Jesus Rubio and last saw him 05/2019. He has a history of CABG and aflutter s/p ablation. Ziopatch 06/2019 did not show recurrence of aflutter. Prolonged hospitalization at [facility] from 03/17/20 to 04/17/20 for COVID19 with hypoxic respiratory failure due to ARDS, bilateral subsegmental PE, GI bleed requiring transfusion, NSTEMI, afib, L occipital lobe CVA.    Presented to [facility] ER 07/29/20 after an episode of syncope. ECG showed sinus rhythm with RBBB. A ziopatch was ordered. Ziopatch showed 16 short SVT episodes, appear to be atrial tach.    12/2020 right sided rib fractures    02/26/21 ER visit for afib with RVR and HTN. Prescribed prn diltiazem by ER in addition to BID metoprolol.    04/2021 PET stress for evaluation of CP, showing large reversible apical/anterior/septal defect. Referred for [facility]): interval proximal occlusion of the left main, LIMA is patent but mid LAD occlusion prevents backfilling of the proximal LAD. Unsuccessful attempt to pass a wire through CTO left main. All grafts patent. RAS 70% on the R. Also found to have differential of >77mmHg SBP/DBP between L and R arm.     05/2021 reviewed case with Jesus Rubio. Plan for treatment of RAS. Do not think coronary intervention will be worth the risk. S/p DES to right renal artery. Angiography also showed right subclavian stenosis.    Interval History:    Finished cardiac rehab. No  major chest pain. 1-2 nitroglycerin around thanksgiving for CP. Gets tired. Not too much palpitations, had one episode in November had to take extra diltiazem when at Jesus Rubio office.    Little dizzy when standing up, changing body position. No syncope.    Little bit of red blood on the TP, occasionally.     Review of Systems  10 systems were reviewed and negative except as noted in HPI.    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    Prior Cardiovascular Interventions / Surgery:  CABG 2006 LIMA-LAD,  SVG-OM, SVG-PDA  Aflutter ablation 2016, and 2017  R renal artery PCI 05/2021 [facility]    Prior Cardiovascular Diagnostics:    ECG 06/28/20 sinus rhythm 77bpm, RBBB with non-specific ST/TW abnormalities  ECG 02/26/21 afib 120bpm, RBBB  ECG 03/05/21 sinus rhythm with PACs, RBBB  ECG 04/04/21 sinus rhythm with PACs, RBBB  ECG 07/04/21 sinus rhythm, RBBB    Echo 04/11/20 EF normal  Echo 12/2020 normal EF, normal RV    Ziopatch 07/2020  - 16 short SVT episodes were recorded,  the longest lasting 7.7 seconds. These appear to be atrial tachycardia.   - Otherwise unremarkable 14 day ambulatory cardiac monitor without patient triggered events.     PET Stress 04/2021  - Abnormal myocardial perfusion study  - There is a large in size, moderately severe, completely reversible perfusion defect involving the apical, apical anterior, mid anterior, basal anterior, apical septal, mid anteroseptal, basal anteroseptal, mid inferoseptal and basal inferoseptal segments. This is consistent with probable ischemia.  - Left ventricular systolic function is normal. Post stress the ejection fraction is > 60%.  - Coronary and aortic calcifications are noted  - Aortic valve calcifications are noted  - Attenuation CT scan shows post CABG findings  - Status post cholecystectomy  - Nonspecific bilateral reticular changes in the lungs, including areas of atelectasis    LHC 05/04/21  Small area of lateral wall hypokinesis with preserved LV systolic function.  Coronary artery disease including proximal occlusion of the left main and long 70% lesion in the mid-RCA.  Prior coronary bypass surgery with patent LIMA to distal LAD, patent SVG to OM1 and OM2 and patent SVG to PDA.   70% right renal artery stenosis. Widely patent left renal artery.  Interval change since angiography on 07/14/14 was total occlusion of left main. Proximal LAD was now supplied by collaterals from PDA since occlusion of mid-LAD (present in 2016) prevented backfilling of the proximal LAD from LIMA to LAD graft.  Unsuccessful attempt to pass a wire through totally occluded left main.      Other Surgical History:   has a past surgical history that includes Replacement total knee; Cholecystectomy; Thyroidectomy, partial; Prostate surgery; Foot arthrodesis, triple; Cardiac surgery (2006); Joint replacement; pr upper gi endoscopy,biopsy (N/A, 12/01/2013); pr compre ep eval abltj 3d mapg tx svt (N/A, 05/16/2015); Knee arthroscopy; pr colonoscopy flx dx w/collj spec when pfrmd (N/A, 04/03/2020); pr gi imag intraluminal esophagus-ileum w/i&r (N/A, 04/05/2020); pr cath place/coron angio, img super/interp,w left heart ventriculography (N/A, 05/03/2021); and pr revascularize fem/pop artery,angioplasty/stent (N/A, 06/03/2021).    Current Outpatient Medications   Medication Instructions    acetaminophen (TYLENOL) 650 mg, Oral, Every 6 hours PRN    albuterol HFA 90 mcg/actuation inhaler 2 puffs, Inhalation, Every 6 hours PRN    apixaban (ELIQUIS) 5 mg, Oral, 2 times a day (standard)    aspirin (ECOTRIN) 81 mg, Oral, Daily (standard)    blood sugar diagnostic (ONETOUCH VERIO TEST STRIPS) Strp USE TO CHECK BLOOD SUGAR 1-2 TIMES PER DAY    blood-glucose meter kit Check BS every day    cyanocobalamin (vitamin B-12) 1,000 mcg, Subcutaneous, Every 30 days    dilTIAZem (CARDIZEM) 30 MG tablet 1 tab Q3 hr PRN bad palpitations, HR>110.  Be sure top number blood pressure over 95.    empty container (SHARPS-A-GATOR DISPOSAL SYSTEM) Misc Use as directed for sharps disposal    ergocalciferol-1,250 mcg (50,000 unit) (DRISDOL) 1,250 mcg, Oral, Weekly    evolocumab (REPATHA SURECLICK) 140 mg/mL PnIj Inject the contents of one pen (140 mg) under the skin every fourteen (14) days.    ferrous sulfate 325 mg, Oral, 2 times a day (standard)    fluticasone propionate (FLONASE) 50 mcg/actuation nasal spray 2 sprays, Each Nare, Daily (standard)    gabapentin (NEURONTIN) 100 mg, Oral, 3 times a day (standard)    glipiZIDE (GLUCOTROL) 5 MG tablet TAKE 1 TABLET EVERY MORNING FOR DIABETES    isosorbide mononitrate (IMDUR) 30 mg, Oral, Daily (standard)    metoprolol succinate (TOPROL-XL) 100 MG  24 hr tablet TAKE 2 TABLETS BY MOUTH EVERY DAY    miscellaneous medical supply Misc Glucometer  Dx: E11.9    miscellaneous medical supply Misc Glucometer test strips   Dx: E11.9    miscellaneous medical supply Misc Lancets  Dx  E11.9    nitroglycerin (NITROLINGUAL) 0.4 mg/dose spray 1 spray, Every 5 min PRN    nitroglycerin (NITROSTAT) 0.4 mg, Sublingual, Every 5 min PRN, Maximum of 3 doses in 15 minutes.    omeprazole (PRILOSEC) 20 mg, Oral, 2 times a day (standard)    sucralfate (CARAFATE) 1 g, Oral, 3 times a day (standard), For stomach protection due to anemia / history of gastritis.    traZODone (DESYREL) 50 mg, Oral, Nightly      Allergies:  is allergic to escitalopram oxalate, ace inhibitors, and statins-hmg-coa reductase inhibitors.    Social History:  He  reports that he quit smoking about 50 years ago. His smoking use included cigarettes. He started smoking about 65 years ago. He has a 15.0 pack-year smoking history. He has never used smokeless tobacco. He reports that he does not drink alcohol and does not use drugs.    Family History:  His family history includes Cancer in his brother, brother, brother, father, mother, sister, and sister; Depression in his mother; Heart attack in his brother, father, and mother; Lung cancer in his brother.       OBJECTIVE:      BP 120/72  - Pulse 79  - Temp 35.9 ??C (96.7 ??F) (Temporal)  - Resp 16  - Ht 182.9 cm (6' 0.01)  - Wt (!) 109.9 kg (242 lb 3.2 oz)  - SpO2 93%  - BMI 32.84 kg/m??      Vitals:    04/14/22 1436   BP: 120/72   Pulse: 79   Resp: 16 Temp: 35.9 ??C (96.7 ??F)   TempSrc: Temporal   SpO2: 93%   Weight: (!) 109.9 kg (242 lb 3.2 oz)   Height: 182.9 cm (6' 0.01)     Wt Readings from Last 12 Encounters:   04/14/22 (!) 109.9 kg (242 lb 3.2 oz)   02/24/22 (!) 110.5 kg (243 lb 8 oz)   02/13/22 (!) 109.3 kg (241 lb)   02/06/22 (!) 108 kg (238 lb)   02/05/22 (!) 108.9 kg (240 lb)   02/03/22 (!) 109.3 kg (241 lb)   01/30/22 (!) 110.2 kg (243 lb)   01/29/22 (!) 108.9 kg (240 lb)   01/27/22 (!) 108.4 kg (239 lb)   01/23/22 (!) 108.4 kg (239 lb)   01/22/22 (!) 109.8 kg (242 lb)   01/20/22 (!) 108.9 kg (240 lb)     BP Readings from Last 5 Encounters:   04/14/22 120/72   02/24/22 127/78   02/13/22 138/72   02/06/22 141/78   02/05/22 126/70     Pulse Readings from Last 3 Encounters:   04/14/22 79   02/24/22 76   02/13/22 (P) 91       General:  Alert, no distress.   Eyes:  EOMI, sclerae anicteric   Neck: No carotid bruit. JVP is not visible sitting upright   Resp:   CTAB bilaterally with normal WOB.   CV:  RRR with frequent ectopy, normal s1 and s2, no murmurs, normal radial pulses b/l, mild LE edema   GI:   Abdomen soft, non-tender   MSK: Normal muscle tone, no joint swelling/effusion   Skin: No lesions/rashes.   Neuro: No focal deficits.   Psych:  Normal mood, normal affect     Lab Results   Component Value Date    HGB 12.0 (L) 02/13/2022    HGB 10.9 (L) 01/16/2022    HGB 10.6 (L) 12/19/2021    PLT 362 02/13/2022    PLT 344 01/16/2022    PLT 345 12/19/2021     Lab Results   Component Value Date    CREATININE 1.40 (H) 02/13/2022    CREATININE 1.30 01/16/2022    CREATININE 1.40 (H) 12/19/2021    K 4.9 02/13/2022    K 4.9 01/16/2022    K 4.8 12/19/2021      Lab Results   Component Value Date    BNP 408 (H) 03/18/2020     Lab Results   Component Value Date    PROBNP 541.0 (H) 01/16/2022    PROBNP 281.0 04/05/2021    PROBNP 3,890.0 (H) 02/26/2021     Lab Results   Component Value Date    CHOL 129 07/05/2021    CHOL 159 05/03/2021    LDL 48 (L) 07/05/2021    LDL 97 05/03/2021    HDL 50 07/05/2021    HDL 40 05/03/2021    TRIG 295 (H) 07/05/2021    TRIG 112 05/03/2021     Lab Results   Component Value Date    A1C 6.8 (H) 11/13/2021

## 2022-04-14 ENCOUNTER — Ambulatory Visit: Admit: 2022-04-14 | Discharge: 2022-04-15 | Payer: MEDICARE | Attending: Internal Medicine | Primary: Internal Medicine

## 2022-04-14 DIAGNOSIS — I480 Paroxysmal atrial fibrillation: Principal | ICD-10-CM

## 2022-04-14 DIAGNOSIS — E1169 Type 2 diabetes mellitus with other specified complication: Principal | ICD-10-CM

## 2022-04-14 DIAGNOSIS — I4892 Unspecified atrial flutter: Principal | ICD-10-CM

## 2022-04-14 DIAGNOSIS — I259 Chronic ischemic heart disease, unspecified: Principal | ICD-10-CM

## 2022-04-14 DIAGNOSIS — E785 Hyperlipidemia, unspecified: Principal | ICD-10-CM

## 2022-04-14 DIAGNOSIS — I152 Hypertension secondary to endocrine disorders: Principal | ICD-10-CM

## 2022-04-14 DIAGNOSIS — E1159 Type 2 diabetes mellitus with other circulatory complications: Principal | ICD-10-CM

## 2022-04-14 DIAGNOSIS — I2089 Chronic stable angina: Principal | ICD-10-CM

## 2022-04-14 DIAGNOSIS — I771 Stricture of artery: Principal | ICD-10-CM

## 2022-04-14 DIAGNOSIS — I701 Atherosclerosis of renal artery: Principal | ICD-10-CM

## 2022-04-14 DIAGNOSIS — I240 Acute coronary thrombosis not resulting in myocardial infarction: Principal | ICD-10-CM

## 2022-04-14 NOTE — Unmapped (Addendum)
Let us know if you are having episodes of palpitations/fluttering. This is probably irregular heart rhythm.    No medication changes from me. Depending on what the blood tests from Dr Hollice Espy show, we can consider stopping aspirin if blood counts are dropping or if you have more persistent GI bleeding.    See me in 4 months    [name], MD, PhD  [facility]  Cardiology

## 2022-04-17 NOTE — Unmapped (Signed)
[facility] OCCUPATIONAL THERAPY [place]  OUTPATIENT OCCUPATIONAL THERAPY  04/17/2022    Patient Name: Jesus Rubio  Date of Birth:30-Sep-1945       Patient seen for evaluation 05/28/21. Was to have one follow up visit though did not occur. Discharge as lost to follow up and would need next Rx for services.      I attest that I have reviewed the above information.  Signed: Mervin Hack, OT  04/17/2022 11:40 AM

## 2022-04-22 NOTE — Unmapped (Signed)
Results as available reviewed with patient and wife at office visit on 01/16/2022.  Additional results and final labs reviewed with patient and wife at the time of office visit on 02/13/2022.

## 2022-04-22 NOTE — Unmapped (Signed)
Results reviewed with patient and wife at office visit on 02/24/2022.

## 2022-04-22 NOTE — Unmapped (Signed)
Results reviewed with patient and wife at office visit on 01/16/2022.

## 2022-04-22 NOTE — Unmapped (Signed)
[facility] Refill Coordination Note    Specialty Lite Medication(s) to be Shipped:   General Specialty: Repatha    Other medication(s) to be shipped: No additional medications requested for fill at this time     Jesus Rubio, DOB: [birth year]  Phone: [phone] (home)       All above HIPAA information was verified with patient's family member, wife.     Was a Nurse, learning disability used for this call? No    Changes to medications: Jesus Rubio reports no changes at this time.  Changes to insurance: No      REFERRAL TO PHARMACIST     Referral to the pharmacist: Not needed      [facility]     Shipping address confirmed in Epic.     Delivery Scheduled: Yes, Expected medication delivery date: 01/18.     Medication will be delivered via Same Day Courier to the prescription address in Epic WAM.    Antonietta Barcelona   [facility] Specialty Technician

## 2022-04-24 MED FILL — REPATHA SURECLICK 140 MG/ML SUBCUTANEOUS PEN INJECTOR: SUBCUTANEOUS | 56 days supply | Qty: 4 | Fill #10

## 2022-04-28 ENCOUNTER — Ambulatory Visit: Admit: 2022-04-28 | Discharge: 2022-04-29 | Payer: MEDICARE | Attending: Family Medicine | Primary: Family Medicine

## 2022-04-28 DIAGNOSIS — E538 Deficiency of other specified B group vitamins: Principal | ICD-10-CM

## 2022-04-28 DIAGNOSIS — R5383 Other fatigue: Principal | ICD-10-CM

## 2022-04-28 DIAGNOSIS — I4892 Unspecified atrial flutter: Principal | ICD-10-CM

## 2022-04-28 DIAGNOSIS — B353 Tinea pedis: Principal | ICD-10-CM

## 2022-04-28 DIAGNOSIS — G629 Polyneuropathy, unspecified: Principal | ICD-10-CM

## 2022-04-28 DIAGNOSIS — E1142 Type 2 diabetes mellitus with diabetic polyneuropathy: Principal | ICD-10-CM

## 2022-04-28 DIAGNOSIS — I2510 Atherosclerotic heart disease of native coronary artery without angina pectoris: Principal | ICD-10-CM

## 2022-04-28 DIAGNOSIS — D500 Iron deficiency anemia secondary to blood loss (chronic): Principal | ICD-10-CM

## 2022-04-28 LAB — BASIC METABOLIC PANEL
ANION GAP: 12 mmol/L (ref 7–15)
BLOOD UREA NITROGEN: 14 mg/dL (ref 7–21)
BUN / CREAT RATIO: 13
CALCIUM: 9.7 mg/dL (ref 8.5–10.2)
CHLORIDE: 102 mmol/L (ref 98–107)
CO2: 22 mmol/L (ref 22.0–32.0)
CREATININE: 1.1 mg/dL (ref 0.70–1.30)
EGFR CKD-EPI (2021) MALE: 69 mL/min/{1.73_m2} (ref >=60)
GLUCOSE RANDOM: 186 mg/dL — ABNORMAL HIGH (ref 74–106)
POTASSIUM: 4.8 mmol/L (ref 3.5–5.0)
SODIUM: 136 mmol/L (ref 135–145)

## 2022-04-28 LAB — LIPID PANEL
CHOLESTEROL/HDL RATIO SCREEN: 2.9 (ref 1.0–4.5)
CHOLESTEROL: 126 mg/dL (ref 100–199)
HDL CHOLESTEROL: 44 mg/dL (ref >=40)
LDL CHOLESTEROL CALCULATED: 33 mg/dL — ABNORMAL LOW (ref 60–99)
NON-HDL CHOLESTEROL: 82 mg/dL
TRIGLYCERIDES: 244 mg/dL — ABNORMAL HIGH (ref <150)
VLDL CHOLESTEROL CAL: 48.8 mg/dL — ABNORMAL HIGH (ref 0–40)

## 2022-04-28 LAB — CBC W/ AUTO DIFF
BASOPHILS ABSOLUTE COUNT: 0.1 10*9/L (ref 0.0–0.1)
BASOPHILS RELATIVE PERCENT: 1.3 %
EOSINOPHILS ABSOLUTE COUNT: 0.2 10*9/L (ref 0.0–0.5)
EOSINOPHILS RELATIVE PERCENT: 2 %
HEMATOCRIT: 39.3 % (ref 39.0–48.0)
HEMOGLOBIN: 12.9 g/dL (ref 12.9–16.5)
LYMPHOCYTES ABSOLUTE COUNT: 1.6 10*9/L (ref 1.1–3.6)
LYMPHOCYTES RELATIVE PERCENT: 15 %
MEAN CORPUSCULAR HEMOGLOBIN CONC: 32.9 g/dL (ref 32.0–36.0)
MEAN CORPUSCULAR HEMOGLOBIN: 27.8 pg (ref 25.9–32.4)
MEAN CORPUSCULAR VOLUME: 84.7 fL (ref 77.6–95.7)
MEAN PLATELET VOLUME: 8.5 fL (ref 6.8–10.7)
MONOCYTES ABSOLUTE COUNT: 0.8 10*9/L (ref 0.3–0.8)
MONOCYTES RELATIVE PERCENT: 7.9 %
NEUTROPHILS ABSOLUTE COUNT: 7.9 10*9/L — ABNORMAL HIGH (ref 1.8–7.8)
NEUTROPHILS RELATIVE PERCENT: 73.8 %
NUCLEATED RED BLOOD CELLS: 0 /100{WBCs} (ref <=4)
PLATELET COUNT: 303 10*9/L (ref 150–450)
RED BLOOD CELL COUNT: 4.64 10*12/L (ref 4.26–5.60)
RED CELL DISTRIBUTION WIDTH: 16.3 % — ABNORMAL HIGH (ref 12.2–15.2)
WBC ADJUSTED: 10.7 10*9/L (ref 3.6–11.2)

## 2022-04-28 LAB — HEPATIC FUNCTION PANEL
ALBUMIN: 4.2 g/dL (ref 3.4–5.0)
ALKALINE PHOSPHATASE: 66 U/L (ref 38–126)
ALT (SGPT): 32 U/L (ref <50)
AST (SGOT): 36 U/L (ref 19–55)
BILIRUBIN DIRECT: 0 mg/dL (ref 0.00–0.40)
BILIRUBIN TOTAL: 0.4 mg/dL (ref 0.1–1.2)
PROTEIN TOTAL: 7.5 g/dL (ref 6.5–8.3)

## 2022-04-28 LAB — IRON & TIBC
IRON SATURATION: 26 %
IRON: 100 ug/dL (ref 37–170)
TOTAL IRON BINDING CAPACITY: 391 ug/dL (ref 252–479)

## 2022-04-28 MED ORDER — CLOTRIMAZOLE 1 % TOPICAL CREAM
Freq: Two times a day (BID) | TOPICAL | 0 refills | 0 days | Status: CP
Start: 2022-04-28 — End: 2023-04-28

## 2022-04-28 MED ORDER — CAPSAICIN 0.025 % TOPICAL CREAM
Freq: Two times a day (BID) | TOPICAL | 1 refills | 0 days | Status: CP
Start: 2022-04-28 — End: 2023-04-28

## 2022-04-28 NOTE — Unmapped (Signed)
Abstraction Result Flowsheet Data    This patient's last AWV date: [facility] Last Medicare Wellness Visit Date: 12/09/2017  This patients last WCC/CPE date: : Not Found      Reason for Encounter  Reason for Encounter: Outreach  Primary Reason for Outreach: AWV  Text Message: No  MyChart Message: No  Outreach Call Outcome: Left message

## 2022-04-28 NOTE — Unmapped (Signed)
Health Maintenance Due   Topic Date Due    COVID-19 Vaccine (1) Never done    Zoster Vaccines (1 of 2) Never done    DTaP/Tdap/Td Vaccines (1 - Tdap) 04/08/2002    Medicare Annual Wellness Visit (AWV)  01/09/2019    Influenza Vaccine (1) 12/06/2021    Retinal Eye Exam  12/13/2021    Urine Albumin/Creatinine Ratio  02/01/2022           Goals & Recommendations:    Goals and Recommendations related to your health are outlined below.    If discussed and provided at your visit today: Please, keep and record your blood sugar, blood pressure, weight, fluid intake, dietary intake and exercise amount and frequency.     Blood Pressure Goals (unless otherwise instructed):  Check 2-3 times a week.  Typical average out of office measurement: <135 mmHg systolic and < 85 mmHg diastolic (less than 135/85).  More strict recommendations for out of office measurement: < 130 mmHg systolic and < 80 mmHg diastolic (less than 130/80).  Typical in office measurement <140 mmHg systolic and <90 mmHg diastolic (less than 140/90).  Most people will not feel well if blood pressure drops to less than 110/70, unless they typically have blood pressures in this range.   If you are noticing sudden increases or decreases in blood pressure, then please contact the office.    Diabetes: Goal Hemoglobin A1c 7.9 or lower.  Goal Blood Sugars Fasting: 60-140 (may check fasting and two hours after a meal later in the day).    For Monitoring your Mood your PHQ-9 Goal is less than 10.    Drink 48 ounces of water per day.  Avoid sweetened beverages.  You will generally feel better and experience improved health when you limit, and if possible avoid, processed foods.  Diets high in plant based foods, vegetables and fruits are associated with many health benefits.    Exercise for a minimum of 20 minutes daily. Minimum of four days a week.   If possible try to obtain 60 minutes of exercise daily for five or more days per week.  Regular movement throughout the day is best.   Walking is a good form of movement.   Squats, lunges, step-ups, push-ups, planks, and arm exercises with or without weights or resistance bands are good forms of exercise.    If your BMI (body mass index) is above 25 then your body is carrying more weight than is typically healthy.  If your BMI (body mass index) is above 25 and you are not achieving the healthy weight goals that you desire, then consider the following:  Keep calories below 2500 calories per day unless otherwise instructed. Remember that all calories are not equal.  Keep a food journal and record accurately.  Read labels and ingredient lists on the products that you consume.  Sometimes we need to adjust your level of physical activity, and or tailor an exercise program more specifically for your needs, just ask and we will come up with a plan.  If you have questions about any of these recommendations, please ask.    If you smoke, stop.  If you have heart failure or severe kidney disease: weigh yourself daily and maintain fluid intake to less than 2 Liters daily unless otherwise instructed.  If you have diabetes you need to see the eye doctor at least every one to two years and have a yearly foot exam.  Set aside time daily for relaxation.  Plan a pleasant activity that you can look forward to and enjoy.  Medication changes are outlined on your After Visit Summary.  Any referrals placed today will be outlined on your After Visit Summary.    If you have questions, please call the office or use MyChart.   If you need a refill on a medication, please contact your pharmacy.  Please, allow at least two business days for responses to refill requests.    Please, bring all medications and when possible original prescription containers to each and every office visit.    If you have been provided and / or requested to record your blood sugars, blood pressures, weight, fluid intake, dietary intake, or exercise, then please bring records to each and every appointment.    When you see other providers for health care, please request that they send information and records related to your visit to our office. This includes vaccinations, mammograms, radiology, procedures, diagnostic testing, lab testing and physical exams or wellness exams that you have outside of the [facility].      [facility]  [address], [address]  [place], [place] [address]  Office: [phone]  Fax: [phone]               If a lab, test, imaging order or referral was placed today it will show on your AVS.    Labs:  Please obtain labs today prior to leaving, unless otherwise instructed.  If you have been asked to obtain labs at a later date, please schedule a Lab Visit appointment.    X-rays (plain films):    Plain film (traditional) x-rays do not require scheduling at [facility] or elsewhere in the [facility] system.  If you had an x-ray ordered today, our office recommends going to get the x-ray performed as soon as you can, unless otherwise instructed.  If you are obtaining X-rays (plain films) outside of the [facility] system, then please verify with your health care provider so that we can assist in coordinating any needed scheduling / appointments.    Referrals (referral for specialty consultation), Imaging (CT, MRI, Ultrasounds, Cardiac Testing, Other Imaging, Spirometry, Sleep Studies):  If there is a number on your AVS today for a location to call, then calling this number is recommended.  If there is not a number to call, then you will be contacted via your listed preferred phone number, text and or MyChart.  If you are signed up for [facility], then some information may be directed to your MyChart account as the first line of contact. For example, [facility] may contact you to schedule an appointment for a referral or test first via your MyChart account. If you have signed up previously for a [facility] MyChart account, though you do not currently access or know how to access, then please let a member of our office staff know. If you would like to set up a [facility] MyChart account please see the instructions near the end of your After Visit Summary. If you need assistance with [facility], you may call the [facility] at [phone].   Phone calls to schedule through [facility] may come from [phone] or [phone] area codes.  If you have not heard regarding scheduling of your referral in the next 7 days, then please contact our office at [phone] option 5.    Insurance Specific Considerations:  Please review information from your  insurance provider regarding scheduling and coverage for specialty care, procedures and imaging.  If you know that your insurance specifically requires a referral or authorization prior to scheduling with a specialty care provider or for a test, please contact our office prior to scheduling at [phone] option 5.    Forms:  We are happy to assist with any forms you may need. We typically complete most forms within 7 business days.  For all forms that you need our office to complete, please review the forms in their entirety first, complete any areas that are required by you including any necessary signatures prior to leaving the forms with our office. Please note that many forms require signatures from patients related to consent for a health care provider or office to complete the form, where required this if often outlined on the forms that you would be submitting. Please also note the time frames for requested return of forms prior to providing to our office as well as any supporting documentation that you may need to provide. The most common reason for delayed completion of forms or return of forms to patients or submissions elsewhere are forms which are not signed by the patient where needed or forms requested for which we do not have supporting documentation or where additional information is needed prior to our completion of the requested forms. Please also note that there are instances beyond our control where forms may not be available within 7 business days.    Refills:  Please allow 3 business days for any refill requests to be processed. Please contact your pharmacy for any refills and to check the status on any refill request. In some instances your insurance may require a prior approval for certain medications. In those cases, refills are processed after we receive approval and this could possibly lengthen the turnaround time.    No Show and Late arrivals:  Please call the office when you need to cancel or reschedule an appointment, not doing so places the appointment as a no-show. If you have 3 or more no shows in a year, you may be asked to locate another provider outside of our office. If you arrive 15 minutes after your appointment time, you may be asked to reschedule.    Phone:  Our phone calls are answered Monday-Friday 8:00 am to 5:00 pm, by a staff member. Calls outside of these times and on weekends, will be answered by a registered nurse with [facility]. If needed, they will reach out to providers on call. Refills are not processed after normal business hours.     Coordination of Care:  When you see other providers for health care, please request that they send information and records related to your visit to our office. This includes vaccinations, mammograms, radiology, procedures, diagnostic testing, lab testing and physical exams or wellness exams that you have outside of the [facility] system. Our contact information is outlined below. Please periodically review your preferred contact information, preferred methods of contact and individuals listed on your contact list in our records. We recommend when possible having at least two phone numbers available for contact. If you have a MyChart account, please access and review your account periodically to update information and check for any new information that may have been sent to you.     Planning for your next visit:  Please bring any needed information to update your insurance, payment records, and preferred contact information.  Updated Medication List including supplements and over the  counter medications.  Any information related to health care visits elsewhere since your last visit, including: urgent cares, emergency departments, specialist offices, or other imaging or procedures.    We Acknowledge:  We acknowledge that while technology has made great progress in allowing [place] to communicate more and has improved some of the connectivity of health care and health care providers, these systems are not perfect. If any questions exist please always ask and if anything is unclear please let us know. In regards to tests, labs, imaging and recommendations for referrals or other procedures from our office, you should always hear something as far as an appointment, a result, a recommendation or a next step.    Billing:  For questions regarding billing please call [phone] option #4.  If you are signed up for [facility], then all billing statements will come to you directly by way of your [facility] MyChart account. Beginning in September 2023, if you are signed up for [facility], you will not receive a mailed paper billing statement from [facility]. If you have signed up previously for a [facility] MyChart account, though you do not currently access or know how to access, then please let a member of our office staff know. If you would like to set up a [facility] MyChart account please see the instructions near the end of your After Visit Summary. If you need assistance with your [facility], you may call the [facility] at [phone].     Inclement Weather:  During times of inclement weather please call the office to check on any changes in hours of operation.    Survey:  Thank you for the chance to participate in your health care.  Soon you should be receiving a survey about your recent visit to [facility].  Please take a moment to complete this survey.  We take your feedback very seriously and will use it to guide our continuous improvement efforts.     [facility]  [address]  [place], [place] [address]  Office: [phone]  Fax: [phone]           Learning About Mindfulness for Stress  What are mindfulness and stress?    Stress is what you feel when you have to handle more than you are used to. A lot of things can cause stress. You may feel stress when you go on a job interview, take a test, or run a race. This kind of short-term stress is normal and even useful. It can help you if you need to work hard or react quickly.  Stress also can last a long time. Long-term stress is caused by stressful situations or events. Examples of long-term stress include long-term health problems, ongoing problems at work, and conflicts in your family. Long-term stress can harm your health.  Mindfulness is a focus only on things happening in the present moment. It's a process of purposefully paying attention to and being aware of your surroundings, your emotions, your thoughts, and how your body feels. You are aware of these things, but you aren't judging these experiences as good or bad. Mindfulness can help you learn to calm your mind and body to help you cope with illness, pain, and stress.  How does mindfulness help to relieve stress?  Mindfulness can help quiet your mind and relax your body. Studies show that it can help some people sleep better, feel less  anxious, and bring their blood pressure down. And it's been shown to help some people live and cope better with certain health problems like heart disease, depression, chronic pain, and cancer.  How do you practice mindfulness?  To be mindful is to pay attention, to be present, and to be accepting.  When you're mindful, you do just one thing and you pay close attention to that one thing. For example, you may sit quietly and notice your emotions or how your food tastes and smells.  When you're present, you focus on the things that are happening right now. You let go of your thoughts about the past and the future. When you dwell on the past or the future, you miss moments that can heal and strengthen you. You may miss moments like hearing a child laugh or seeing a friendly face when you think you're all alone.  When you're accepting, you don't judge the present moment. Instead you accept your thoughts and feelings as they come.  You can practice anytime, anywhere, and in any way you choose. You can practice in many ways. Here are a few ideas:  While doing your chores, like washing the dishes, let your mind focus on what's in your hand. What does the dish feel like? Is the water warm or cold?  Go outside and take a few deep breaths. What is the air like? Is it warm or cold?  When you can, take some time at the start of your day to sit alone and think.  Take a slow walk by yourself. Count your steps while you breathe in and out.  Try yoga breathing exercises, stretches, and poses to strengthen and relax your muscles.  At work, if you can, try to stop for a few moments each hour. Note how your body feels. Let yourself regroup and let your mind settle before you return to what you were doing.  If you struggle with anxiety or worry thoughts, imagine your mind as a blue sky and your worry thoughts as clouds. Now imagine those worry thoughts floating across your mind's sky. Just let them pass by as you watch.  Follow-up care is a key part of your treatment and safety. Be sure to make and go to all appointments, and call your doctor if you are having problems. It's also a good idea to know your test results and keep a list of the medicines you take.  Where can you learn more?  Go to [facility] at [email or web address]  Select Preferences in the upper right hand corner, then select Health Library under Resources. Enter [phone] in the search box to learn more about Learning About Mindfulness for Stress.  Current as of: January 15, 2016  Content Version: 11.6  ?? 2006-2018 [employer], [employer]. Care instructions adapted under license by [name]. If you have questions about a medical condition or this instruction, always ask your healthcare professional. [employer], Incorporated disclaims any warranty or liability for your use of this information.         Learning About Guided Imagery for Stress  What are guided imagery and stress?    Stress is what you feel when you have to handle more than you are used to. A lot of things can cause stress. You may feel stress when you go on a job interview, take a test, or run a race. This kind of short-term stress is normal and even useful. It can help you if you need to work hard or react quickly.  Stress also can last a long time. Long-term stress is caused by stressful situations or events. Examples of long-term stress include long-term health problems, ongoing problems at work, and conflicts in your family. Long-term stress can harm your health.  Guided imagery is a technique of directed thoughts and suggestions that guide your mind toward a relaxed, focused state. This technique helps you use your mind to take you to a calm, peaceful place. You can use it to relax, which can lower blood pressure and reduce other problems related to stress.  How does guided imagery help to relieve stress?  Because of the way the mind and body are connected, guided imagery can make you feel like you are experiencing something just by imagining it. You can achieve a relaxed state when you imagine all the details of a safe, comfortable place, such as a beach or a garden. This relaxed state may help you feel more in control of your emotions and thought processes. Feeling in control may improve your attitude, health, and sense of well-being.  How do you do guided imagery?  You can use a smartphone app or a video to lead you through the process. You use all of your senses in guided imagery. For example, if you want a tropical setting, you can imagine the warm breeze on your skin, the bright blue of the water, the sound of the surf, the sweet scent of tropical flowers, and the taste of coconut. Imagining those things can make you actually feel like you're there.  But you don't have to imagine the tropics to feel peace. Guided imagery can take you to your own restful place. To give guided imagery a try, follow these steps:  Lean back comfortably in your chair. If you can, close your eyes. Put your arms on the armrests, or fold your hands in your lap.  Take a deep breath through your nose. Breathe in slowly, and then let the air out completely through your mouth.  Do it again slowly. Keep breathing like this. Gather up any tension in your body, and send it out with every breath.  Let a feeling of warmth spread from your lungs to your neck and head, down your arms to your fingertips, through your body and into your legs, all the way down to your toes. Stay this way for a moment.  Now imagine a pleasant day. You're at a park or at a place you've visited in the past where you felt at peace.  In your mind's eye, look at what lies in front of you. Maybe you see the sun, filtered through trees. Maybe clouds are drifting by.  Look to one side, and then the other. Notice the feel of the air around you. Notice how it feels on your face and on your arms.  Stay here for a while. Let it become real for you.  Follow-up care is a key part of your treatment and safety. Be sure to make and go to all appointments, and call your doctor if you are having problems. It's also a good idea to know your test results and keep a list of the medicines you take.  Where can you learn more?  Go to [facility] at [email or web address]  Select Preferences in the upper right hand corner, then select Health Library under Resources. Enter N291 in the search box to learn more about Learning About Guided Imagery for Stress.  Current as of: January 15, 2016  Content Version: 11.6  ?? 2006-2018 Healthwise,  Incorporated. Care instructions adapted under license by [facility]. If you have questions about a medical condition or this instruction, always ask your healthcare professional. Healthwise, Incorporated disclaims any warranty or liability for your use of this information.        Learning About the Mediterranean Diet  What is the Mediterranean diet?    The Mediterranean diet is a style of eating rather than a diet plan. It features foods eaten in [place], [place], southern [place] and [place], and other countries along the [employer]. It emphasizes eating foods like fish, fruits, vegetables, beans, high-fiber breads and whole grains, nuts, and olive oil. This style of eating includes limited red meat, cheese, and sweets.  Why choose the Mediterranean diet?  A Mediterranean-style diet may improve heart health. It contains more fat than other heart-healthy diets. But the fats are mainly from nuts, unsaturated oils (such as fish oils and olive oil), and certain nut or seed oils (such as canola, soybean, or flaxseed oil). These fats may help protect the heart and blood vessels.  How can you get started on the Mediterranean diet?  Here are some things you can do to switch to a more Mediterranean way of eating.  What to eat  Eat a variety of fruits and vegetables each day, such as grapes, blueberries, tomatoes, broccoli, peppers, figs, olives, spinach, eggplant, beans, lentils, and chickpeas.  Eat a variety of whole-grain foods each day, such as oats, brown rice, and whole wheat bread, pasta, and couscous.  Eat fish at least 2 times a week. Try tuna, salmon, mackerel, lake trout, herring, or sardines.  Eat moderate amounts of low-fat dairy products, such as milk, cheese, or yogurt.  Eat moderate amounts of poultry and eggs.  Choose healthy (unsaturated) fats, such as nuts, olive oil, and certain nut or seed oils like canola, soybean, and flaxseed.  Limit unhealthy (saturated) fats, such as butter, palm oil, and coconut oil. And limit fats found in animal products, such as meat and dairy products made with whole milk. Try to eat red meat only a few times a month in very small amounts.  Limit sweets and desserts to only a few times a week. This includes sugar-sweetened drinks like soda.  The Mediterranean diet may also include red wine with your meal--1 glass each day for women and up to 2 glasses a day for men.  Tips for eating at home  Use herbs, spices, garlic, lemon zest, and citrus juice instead of salt to add flavor to foods.  Add avocado slices to your sandwich instead of bacon.  Have fish for lunch or dinner instead of red meat. Brush the fish with olive oil, and broil or grill it.  Sprinkle your salad with seeds or nuts instead of cheese.  Cook with olive or canola oil instead of butter or oils that are high in saturated fat.  Switch from 2% milk or whole milk to 1% or fat-free milk.  Dip raw vegetables in a vinaigrette dressing or hummus instead of dips made from mayonnaise or sour cream.  Have a piece of fruit for dessert instead of a piece of cake. Try baked apples, or have some dried fruit.  Tips for eating out  Try broiled, grilled, baked, or poached fish instead of having it fried or breaded.  Ask your server to have your meals prepared with olive oil instead of butter.  Order dishes made with marinara sauce or sauces made from olive oil. Avoid sauces made from cream  or mayonnaise.  Choose whole-grain breads, whole wheat pasta and pizza crust, brown rice, beans, and lentils.  Cut back on butter or margarine on bread. Instead, you can dip your bread in a small amount of olive oil.  Ask for a side salad or grilled vegetables instead of french fries or chips.  Where can you learn more?  Go to [facility] [facility] at [email or web address]  [facility] under the Resources menu. Enter O407 in the search box to learn more about Learning About the Mediterranean Diet.  Current as of: February 11, 2017  Content Version: 12.2  ?? 2006-2019 Healthwise, Inc[employer]. Care instructions adapted under license by [facility]. If you have questions about a medical condition or this instruction, always ask your healthcare professional. Healthwise, Incorporated disclaims any warranty or liability for your use of this information.            Stretching: Exercises  Your Care Instructions  Here are some examples of exercises for stretching. Start each exercise slowly. Ease off the exercise if you start to have pain.  Your doctor or physical therapist will tell you when you can start these exercises and which ones will work best for you.  How to do the exercises  Latissimus stretch    Stand with your back straight and your feet shoulder-width apart. You can do this stretch sitting down if you are not steady on your feet.  Hold your arms above your head, and hold one hand with the other.  Pull upward while leaning straight over toward your right side. Keep your lower body straight. You should feel the stretch along your left side.  Hold 15 to 30 seconds, and then switch sides.  Repeat 2 to 4 times for each side.  Triceps stretch    Stand with your back straight and your feet shoulder-width apart. You can do this stretch sitting down if you are not steady on your feet.  Bring your left elbow straight up while bending your arm.  Grab your left elbow with your right hand, and pull your left elbow toward your head with light pressure. If you are more flexible, you may pull your arm slightly behind your head. You will feel the stretch along the back of your arm.  Hold 15 to 30 seconds, and then switch elbows.  Repeat 2 to 4 times for each arm.  Calf stretch    Place your hands on a wall for balance. You can also do this with your hands on the back of a chair, a countertop, or a tree.  Step back with your left leg. Keep the leg straight, and press your left heel into the floor.  Press your hips forward, bending your right leg slightly. You will feel the stretch in your left calf.  Hold the stretch 15 to 30 seconds.  Repeat 2 to 4 times for each leg.  Quadriceps stretch    Lie on your side with one hand supporting your head.  Bend your upper leg back and grab your ankle with your other hand.  Stretch your leg back by pulling your foot toward your buttocks. You will feel the stretch in the front of your thigh. If this causes stress on your knees, do not do this stretch.  Hold the stretch 15 to 30 seconds.  Repeat 2 to 4 times for each leg.  Groin stretch    Sit on the floor and put the soles of your feet together. Do not slump your back.  Grab your ankles and gently pull your legs toward you.  Press your knees toward the floor. You will feel the stretch in your inner thighs.  Hold 15 to 30 seconds.  Repeat 2 to 4 times.  Hamstring stretch in doorway    Lie on the floor near a doorway, with your buttocks close to the wall.  Let the leg you are not stretching extend through the doorway.  Put the leg you want to stretch up on the wall, and straighten your knee to feel a gentle stretch at the back of your leg.  Hold the stretch for at least 15 to 30 seconds. Repeat 2 to 4 times.  Follow-up care is a key part of your treatment and safety. Be sure to make and go to all appointments, and call your doctor if you are having problems. It's also a good idea to know your test results and keep a list of the medicines you take.  Where can you learn more?  Go to [facility] at [email or web address]  Select Preferences in the upper right hand corner, then select Health Library under Resources. Enter P733 in the search box to learn more about Stretching: Exercises.  Current as of: March 13, 2016  Content Version: 11.6  ?? 2006-2018 Healthwise, Incorporated. Care instructions adapted under license by [facility]. If you have questions about a medical condition or this instruction, always ask your healthcare professional. Healthwise, Incorporated disclaims any warranty or liability for your use of this information.     Patient Education        Preventing Falls: Care Instructions  Injuries and health problems such as trouble walking or poor eyesight can increase your risk of falling. So can some medicines. But there are things you can do to help prevent falls. You can exercise to get stronger. You can also arrange your home to make it safer.    Talk to your doctor about the medicines you take. Ask if any of them increase the risk of falls and whether they can be changed or stopped.   Try to exercise regularly. It can help improve your strength and balance. This can help lower your risk of falling.     Practice fall safety and prevention.    Wear low-heeled shoes that fit well and give your feet good support. Talk to your doctor if you have foot problems that make this hard.  Carry a cellphone or wear a medical alert device that you can use to call for help.  Use stepladders instead of chairs to reach high objects. Don't climb if you're at risk for falls. Ask for help, if needed.  Wear the correct eyeglasses, if you need them.    Make your home safer.    Remove rugs, cords, clutter, and furniture from walkways.  Keep your house well lit. Use night-lights in hallways and bathrooms.  Install and use sturdy handrails on stairways.  Wear nonskid footwear, even inside. Don't walk barefoot or in socks without shoes.    Be safe outside.    Use handrails, curb cuts, and ramps whenever possible.  Keep your hands free by using a shoulder bag or backpack.  Try to walk in well-lit areas. Watch out for uneven ground, changes in pavement, and debris.  Be careful in the winter. Walk on the grass or gravel when sidewalks are slippery. Use de-icer on steps and walkways. Add non-slip devices to shoes.    Put grab bars and nonskid mats in your shower or  tub and near the toilet. Try to use a shower chair or bath bench when bathing.   Get into a tub or shower by putting in your weaker leg first. Get out with your strong side first. Have a phone or medical alert device in the bathroom with you.   Where can you learn more?  Go to [facility] at [email or web address] forces logistics/support/[email or web address] officer in the Menu. Enter G117 in the search box to learn more about Preventing Falls: Care Instructions.  Current as of: October 22, 2021               Content Version: 13.9  ?? 2006-2023 Healthwise, Incorporated.   Care instructions adapted under license by [facility]. If you have questions about a medical condition or this instruction, always ask your healthcare professional. Healthwise, Inc[employer] disclaims any warranty or liability for your use of this information.

## 2022-04-29 LAB — RETICULOCYTES
RETICULOCYTE ABSOLUTE COUNT: 72.1 10*9/L (ref 23.0–100.0)
RETICULOCYTE COUNT PCT: 1.54 % (ref 0.50–2.17)

## 2022-04-29 LAB — FOLATE: FOLATE: >24 ng/mL (ref >=5.4)

## 2022-04-29 LAB — FERRITIN: FERRITIN: 33.2 ng/mL

## 2022-04-30 MED ORDER — METOPROLOL SUCCINATE ER 100 MG TABLET,EXTENDED RELEASE 24 HR
ORAL_TABLET | 1 refills | 0 days | Status: CP
Start: 2022-04-30 — End: ?

## 2022-05-02 NOTE — Unmapped (Signed)
Assessment/Plan     Diagnoses and all orders for this visit:    Type 2 diabetes mellitus with diabetic polyneuropathy, without long-term current use of insulin (CMS-HCC)  -     POCT glycosylated hemoglobin (Hb A1C)  Stable overall. Well controlled overall. Continue current regimen.  -     Albumin/creatinine urine ratio  -     Iron & TIBC; Future  -     Ferritin; Future  -     Folate Level; Future  -     Vitamin B12 Level; Future  -     Reticulocytes; Future  -     Basic Metabolic Panel; Future  -     Hepatic Function Panel; Future  -     Lipid Panel; Future  -     CBC w/ Differential; Future  -     HM DIABETES FOOT EXAM    Fatigue, unspecified type  Improved compared to previous.  Continue current plan of care.  -     Iron & TIBC; Future  -     Ferritin; Future  -     Folate Level; Future  -     Vitamin B12 Level; Future  -     Reticulocytes; Future  -     Basic Metabolic Panel; Future  -     Hepatic Function Panel; Future  -     Lipid Panel; Future  -     CBC w/ Differential; Future    B12 deficiency  Standard education guidance.  Continue current medical care.  Lab valuation.  -     Iron & TIBC; Future  -     Ferritin; Future  -     Folate Level; Future  -     Vitamin B12 Level; Future  -     Reticulocytes; Future  -     Basic Metabolic Panel; Future  -     Hepatic Function Panel; Future  -     Lipid Panel; Future  -     CBC w/ Differential; Future    Coronary artery disease involving native coronary artery of native heart, unspecified whether angina present  Clinically asymptomatic.   Standard education and anticipatory guidance regarding goals, objectives, and sequelae.  Continue current management and plan of care.  Continue aggressive risk factor reduction.  -     Iron & TIBC; Future  -     Ferritin; Future  -     Folate Level; Future  -     Vitamin B12 Level; Future  -     Reticulocytes; Future  -     Basic Metabolic Panel; Future  -     Hepatic Function Panel; Future  -     Lipid Panel; Future  -     CBC w/ Differential; Future    Atrial flutter, unspecified type (CMS-HCC)  Stable overall. Well controlled overall. Continue current regimen.    Iron deficiency anemia due to chronic blood loss  Most recently stable and clinically asymptomatic.   No clinical evidence of ongoing blood loss.  Standard education and anticipatory guidance regarding goals, objectives, and sequelae.  Continue current management and plan of care.  Continue aggressive risk factor reduction.  -     Iron & TIBC; Future  -     Ferritin; Future  -     Folate Level; Future  -     Vitamin B12 Level; Future  -     Reticulocytes; Future  -  Basic Metabolic Panel; Future  -     Hepatic Function Panel; Future  -     Lipid Panel; Future  -     CBC w/ Differential; Future    Peripheral polyneuropathy  Stable at present.    Standard education and anticipatory guidance regarding goals, objectives, and sequelae.  Conservative measures reviewed.  Continue current management and plan of care.  -     Iron & TIBC; Future  -     Ferritin; Future  -     Folate Level; Future  -     Vitamin B12 Level; Future  -     Reticulocytes; Future  -     Basic Metabolic Panel; Future  -     Hepatic Function Panel; Future  -     Lipid Panel; Future  -     CBC w/ Differential; Future  -     Ambulatory referral to Pain Clinic; Future  -     capsaicin (ZOSTRIX) 0.025 % cream; Apply topically two (2) times a day. Use gloves to apply. Wash hands after use. Avoid contact with face, skin folds and mucous membranes.    Tinea pedis of left foot  Standard education and anticipatory guidance regarding goals, objectives, and sequelae.  Continue current management and plan of care.  Continue aggressive risk factor reduction.  -     clotrimazole (LOTRIMIN) 1 % cream; Apply topically two (2) times a day.    Disposition: Return in about 3 weeks (around 05/19/2022) for Recheck Tinea Pedis, Diabetic Neuropathy..  04/30/2022      Diagnoses and plan along with any newly prescribed medication(s), recommendations, orders and therapies were discussed in detail with patient today.   Standard education and anticipatory guidance regarding goals, objectives and potential sequelae were reviewed. Patient voiced appropriate understanding of assessment and plan of care.    Pertinent handouts were given today and reviewed with the patient as indicated.  The Care Plan and Self-Management goals have been included on the AVS and the AVS has been printed.  Where helpful for monitoring and management I have encouraged the patient to keep regular logs for me to review at their next visit. Any outside resources or referrals needed at this time are noted above. No additional referrals necessary at this time. Patient voiced understanding and all questions have been answered to satisfaction.     To complete this documentation in a timely manner, portions of this encounter may have been completed utilizing Dragon voice recognition software. Please take this into consideration in noting nuances of documentation, grammar, punctuation, and wording. If and when a question exists, or for clarification, please contact the author.    Barron Alvine, MD  [facility]  [address], [address]  [place], [name] [address]  Phone [phone]    Subjective:     NWG:NFAOZH, [name], MD    Roen Tebeau, is a 77 y.o. male    Chief Complaint   Patient presents with    Fatigue     Patient states that he has been having weakness and fatigue     Atrial Fibrillation     Patient states that he has been having some palpations     Neuropathy     Patient states that he is having a lot of pain in his feet. Patient states that his calves are getting tight.     Diabetes     Patient states that he has been  checking blood sugar. Patient would like you to look at his feet. States he is having some bleeding and his skin is peeling        HPI:    Patient presents: with wife.    Nursing notes and history reviewed with patient and wife.    Diabetes type II.  Goal A1c less than 8.  No hypo or hyperglycemic symptoms.  Compliant medication regimen denies side effects.    Ongoing fatigue improved compared with previous.  No chest pain shortness of breath orthopnea or PND.  No palpitations or dizziness.    B12 deficiency.  Denies recent concerning related symptoms.  Compliant with oral replacement therapy.    Coronary artery disease. Compliant with therapy. No chest pain, shortness of breath, orthopnea, PND.  No increase nitroglycerin use.  No bleeding complications from aspirin therapy noted.    Atrial flutter. Compliant with Eliquis anticoagulation regimen. No bleeding complications from therapy. No chest pain, palpitations, shortness of breath, orthopnea, PND, or dizziness.    Iron deficiency anemia.  Denies evidence of ongoing blood loss.    Peripheral neuropathy.  Currently stable.  Denies new or additional concerning symptoms.    Rash of the foot on the left.  White skin between toes.  No recent trauma.  Denies new or additional concerning symptoms.    No other complaints.  No additional recent health care visits elsewhere. No urgent care or ED visits.  No additional palliative, provocative, modifying, quantifying, qualifying, or other related factors.  Unless otherwise outlined above patient is pleased with current medication regimen, admits compliance and denies side effects of medication.    The following portions of the patient's history were reviewed and updated as appropriate: allergies, current medications, past family history, past medical history, past social history, past surgical history and problem list.    ROS: Pertinent positives and negatives are as outlined in the HPI above. The remaining balance of the 12 point review of systems is otherwise benign.      PCMH Components:      Goals         <130/80       Lack of exercise or physical activity    Increase exercise or physical activity        Get heart rate stabilized (pt-stated)         Things to think about to help me reach my goal:     What are you going to do? Take medications as prescribed, Keep positive thinking, Pace activities, Stay hydrated   How and how much? Daily   How frequent? Daily   Barriers to success?    Solutions to barriers?      06/04/17: Patient continues to work on this goal   09/29/17 Patient continues to work on this goal   12-09-2017: Reports he continues to work on this and is staying positive.        Take actions to prevent falling       12-03-2016: New goal: Will change positions slowly and pay attention to pathways. Uses cane or walker at times.   09/29/17 Patient continues to work on this goal   12-09-2017: Reports 1 fall 6-8 months ago when he missed a step and fell. Denies injury. Uses cane occasionally.              Family characteristics, patient's social characteristics, patient's cultural characteristics, patient's communication needs, patient's health literacy and behaviors affecting patient's health are outlined under the patient's History Section, Longitudinal  Care Plan or Problem List in Epic or as pertinent in History of Present Illness.    Barriers to goals identified and addressed.   See Assessment and Plan for additional pertinent details.    Objective:     Physical Exam   BP 134/76  - Pulse 91  - Temp 36.8 ??C (98.3 ??F) (Oral)  - Resp 16  - Ht 182.9 cm (6' 0.01)  - Wt (!) 108.4 kg (239 lb)  - SpO2 97%  - BMI 32.41 kg/m??   Constitutional: Oriented to person, place, and time. Appears well-developed and well-nourished. No distress.   Head: Normocephalic and atraumatic.   Mouth/Throat: Oropharynx is without mass or concerning lesions. Moist mucous membranes.    Eyes: Conjunctivae are normal. No scleral icterus.   Neck: Neck supple. Normal range of motion. No JVD present. No thyromegaly present. No concerning neck mass.   Cardiovascular: Normal rate. Regular rhythm. Normal heart sounds. No murmur heard.  Pulmonary/Chest: Effort normal. No respiratory distress. Breath sounds normal. No wheezes. No rhonchi. No rales. No focal breath sound changes. No chest tenderness.  Abdominal: Soft. Non-distended. No abdominal mass. No tenderness. No rebound. No guarding.   Musculoskeletal: No active synovitis. No concerning joint erythema, warmth or swelling.   Range of motion is without evidence of significant impairment.   Strength is without evidence of significant impairment.   No edema of the extremities.   Diabetic Foot Exam:  No prior amputations.  Diabetic Circulation Bilateral.  1+ Dorsalis Pedis & Posterior Tibial Bilateral.  Dystrophic nails throughout.  Decreased sensation throughout.  No ulcers.  There is maceration of the skin between the toes #3 and 4 and #4 and 5 consistent with tinea pedis.  Lymphadenopathy: There is no lymphadenopathy about the head or neck.  Neurological: Alert and oriented to person, place, and time. No cranial nerve deficit. Normal muscle tone. Normal coordination.  Skin: Skin is warm and dry. No pallor. No rash noted.   Psychiatric: Normal mood, affect and behavior. Judgment and thought content normal.

## 2022-05-05 NOTE — Unmapped (Signed)
Triglycerides are elevated.  The remainder of labs are overall OK.  Continue current medications and recommendations.  Keep planned follow-up appointment.

## 2022-05-30 DIAGNOSIS — G4700 Insomnia, unspecified: Principal | ICD-10-CM

## 2022-05-30 MED ORDER — TRAZODONE 50 MG TABLET
ORAL_TABLET | 1 refills | 0 days
Start: 2022-05-30 — End: ?

## 2022-05-31 DIAGNOSIS — D500 Iron deficiency anemia secondary to blood loss (chronic): Principal | ICD-10-CM

## 2022-05-31 MED ORDER — SUCRALFATE 1 GRAM TABLET
ORAL_TABLET | Freq: Three times a day (TID) | ORAL | 1 refills | 30 days | Status: CP
Start: 2022-05-31 — End: 2023-05-31

## 2022-05-31 MED ORDER — TRAZODONE 50 MG TABLET
ORAL_TABLET | 1 refills | 0 days | Status: CP
Start: 2022-05-31 — End: ?

## 2022-05-31 NOTE — Unmapped (Signed)
Patient is requesting the following refill  Requested Prescriptions     Pending Prescriptions Disp Refills    sucralfate (CARAFATE) 1 gram tablet [Pharmacy Med Name: SUCRALFATE 1 GM TABLET] 90 tablet 1     Sig: TAKE 1 TABLET (1 G TOTAL) BY MOUTH THREE (3) TIMES A DAY. FOR STOMACH PROTECTION DUE TO ANEMIA / HISTORY OF GASTRITIS.       Recent Visits  Date Type Provider Dept   04/28/22 Office Visit [name], MD [place] [facility]   02/24/22 Office Visit [name], MD [place] [facility]   02/13/22 Office Visit [name], MD [place] [facility]   01/16/22 Office Visit [name], MD [place] [facility]   12/19/21 Office Visit [name], MD [place] [facility]   11/13/21 Office Visit [name], MD [place] [facility]   10/22/21 Office Visit [name], MD [place] [facility]   08/08/21 Office Visit [name], MD [place] [facility]   07/25/21 Office Visit [name], MD [place] [facility]   07/04/21 Office Visit [name], MD [place] [facility]   Showing recent visits within past 365 days with a meds authorizing provider and meeting all other requirements  Future Appointments  Date Type Provider Dept   07/30/22 Appointment [name], MD [place] [facility]   Showing future appointments within next 365 days with a meds authorizing provider and meeting all other requirements

## 2022-06-03 NOTE — Unmapped (Signed)
Abstraction Result Flowsheet Data    This patient's last AWV date: [facility] Last Medicare Wellness Visit Date: 12/09/2017  This patients last WCC/CPE date: : Not Found      Reason for Encounter  Reason for Encounter: Outreach  Primary Reason for Outreach: AWV  Text Message: No  MyChart Message: No  Outreach Call Outcome: Left message

## 2022-06-06 DIAGNOSIS — E119 Type 2 diabetes mellitus without complications: Principal | ICD-10-CM

## 2022-06-06 DIAGNOSIS — I259 Chronic ischemic heart disease, unspecified: Principal | ICD-10-CM

## 2022-06-06 DIAGNOSIS — I240 Acute coronary thrombosis not resulting in myocardial infarction: Principal | ICD-10-CM

## 2022-06-06 DIAGNOSIS — Z789 Other specified health status: Principal | ICD-10-CM

## 2022-06-06 MED ORDER — REPATHA SURECLICK 140 MG/ML SUBCUTANEOUS PEN INJECTOR
SUBCUTANEOUS | 11 refills | 28 days | Status: CP
Start: 2022-06-06 — End: ?
  Filled 2022-06-11: qty 2, 28d supply, fill #0

## 2022-06-06 NOTE — Unmapped (Signed)
[facility] Refill Coordination Note    Specialty Medication(s) to be Shipped:   General Specialty: Repatha    Other medication(s) to be shipped: No additional medications requested for fill at this time     Jesus Rubio, DOB: [birth year]  Phone: [phone] (home)       All above HIPAA information was verified with patient.     Was a Nurse, learning disability used for this call? No    Completed refill call assessment today to schedule patient's medication shipment from the [facility] [phone]).  All relevant notes have been reviewed.     Specialty medication(s) and dose(s) confirmed: Regimen is correct and unchanged.   Changes to medications: Jesus Rubio reports no changes at this time.  Changes to insurance: No  New side effects reported not previously addressed with a pharmacist or physician: None reported  Questions for the pharmacist: No    Confirmed patient received a Conservation officer, historic buildings and a Surveyor, mining with first shipment. The patient will receive a drug information handout for each medication shipped and additional FDA Medication Guides as required.       DISEASE/MEDICATION-SPECIFIC INFORMATION        For patients on injectable medications: Patient currently has 1 doses left.  Next injection is scheduled for 06/13/22.    SPECIALTY MEDICATION ADHERENCE     Medication Adherence    Patient reported X missed doses in the last month: 0  Specialty Medication: REPATHA SURECLICK 140 mg/mL Pnij (evolocumab)  Patient is on additional specialty medications: No              Were doses missed due to medication being on hold? No        REFERRAL TO PHARMACIST     Referral to the pharmacist: Not needed      [facility]     Shipping address confirmed in Epic.     Patient was notified of new phone menu : No    Delivery Scheduled: Yes, Expected medication delivery date: 06/11/22.     Medication will be delivered via Same Day Courier to the prescription address in Epic WAM.    Jesus Rubio   [facility] Specialty Technician

## 2022-06-07 NOTE — Unmapped (Signed)
Patient is requesting the following refill  Requested Prescriptions     Pending Prescriptions Disp Refills    evolocumab (REPATHA SURECLICK) 140 mg/mL PnIj 2 mL 11     Sig: Inject the contents of one pen (140 mg) under the skin every fourteen (14) days.       Recent Visits  Date Type Provider Dept   04/28/22 Office Visit [name], MD [place] Primary Care At [facility]   04/14/22 Office Visit [name], MD [facility] Cardiology Primary Care [facility]   02/24/22 Office Visit [name], MD [place] Primary [facility] At [facility]   02/13/22 Office Visit [name], MD [place] Primary [facility] At [facility]   01/16/22 Office Visit [name], MD [place] [facility] At [facility]   12/19/21 Office Visit [name], MD [place] [facility] At [facility]   11/26/21 Office Visit [name], MD [facility] Cardiology Primary Care [facility]   11/13/21 Office Visit [name], MD [place] Primary Care At [facility]   10/22/21 Office Visit [name], MD [place] Primary [facility] At [facility]   09/04/21 Office Visit [name], MD [place] [facility] Primary [facility]   Showing recent visits within past 365 days with a meds authorizing provider and meeting all other requirements  Future Appointments  Date Type Provider Dept   07/30/22 Appointment [name], MD  Primary Care At [facility]   08/14/22 Appointment [name], MD  Cardiology Primary [facility]   Showing future appointments within next 365 days with a meds authorizing provider and meeting all other requirements       Labs: Not applicable this refill

## 2022-06-24 DIAGNOSIS — M79621 Pain in right upper arm: Principal | ICD-10-CM

## 2022-06-24 DIAGNOSIS — M898X2 Other specified disorders of bone, upper arm: Principal | ICD-10-CM

## 2022-06-24 DIAGNOSIS — G8929 Other chronic pain: Principal | ICD-10-CM

## 2022-06-24 DIAGNOSIS — M79671 Pain in right foot: Principal | ICD-10-CM

## 2022-06-24 DIAGNOSIS — M79672 Pain in left foot: Principal | ICD-10-CM

## 2022-06-24 MED ORDER — PREGABALIN 50 MG CAPSULE
ORAL_CAPSULE | 0 refills | 0 days
Start: 2022-06-24 — End: ?

## 2022-06-24 NOTE — Unmapped (Signed)
Medication not currently prescribed for patient or on medication list.

## 2022-07-10 NOTE — Unmapped (Signed)
[facility] Refill Coordination Note    Specialty Medication(s) to be Shipped:   General Specialty: Repatha    Other medication(s) to be shipped: No additional medications requested for fill at this time     Matvey Santillana, DOB: November 24, 1945  Phone: [phone] (home)       All above HIPAA information was verified with patient.     Was a Nurse, learning disability used for this call? No    Completed refill call assessment today to schedule patient's medication shipment from the [facility] [phone]).  All relevant notes have been reviewed.     Specialty medication(s) and dose(s) confirmed: Regimen is correct and unchanged.   Changes to medications: Enoc reports no changes at this time.  Changes to insurance: No  New side effects reported not previously addressed with a pharmacist or physician: None reported  Questions for the pharmacist: No    Confirmed patient received a Conservation officer, historic buildings and a Surveyor, mining with first shipment. The patient will receive a drug information handout for each medication shipped and additional FDA Medication Guides as required.       DISEASE/MEDICATION-SPECIFIC INFORMATION        For patients on injectable medications: Patient currently has 1 doses left.  Next injection is scheduled for 07/12/22.    SPECIALTY MEDICATION ADHERENCE     Medication Adherence    Patient reported X missed doses in the last month: 0  Specialty Medication: REPATHA SURECLICK 140 mg/mL Pnij (evolocumab)  Patient is on additional specialty medications: No  Patient is on more than two specialty medications: No  Any gaps in refill history greater than 2 weeks in the last 3 months: no  Demonstrates understanding of importance of adherence: yes              Were doses missed due to medication being on hold? No    REPATHA SURECLICK 140  mg/ml: 10 days of medicine on hand       REFERRAL TO PHARMACIST     Referral to the pharmacist: Not needed      [facility]     Shipping address confirmed in Epic.     Patient was notified of new phone menu : No    Delivery Scheduled: Yes, Expected medication delivery date: 07/15/22.     Medication will be delivered via Same Day Courier to the prescription address in Epic WAM.    [name]   [facility] Specialty Technician

## 2022-07-15 DIAGNOSIS — I259 Chronic ischemic heart disease, unspecified: Principal | ICD-10-CM

## 2022-07-15 DIAGNOSIS — E119 Type 2 diabetes mellitus without complications: Principal | ICD-10-CM

## 2022-07-15 DIAGNOSIS — I240 Acute coronary thrombosis not resulting in myocardial infarction: Principal | ICD-10-CM

## 2022-07-15 DIAGNOSIS — Z789 Other specified health status: Principal | ICD-10-CM

## 2022-07-15 MED FILL — REPATHA SURECLICK 140 MG/ML SUBCUTANEOUS PEN INJECTOR: SUBCUTANEOUS | 28 days supply | Qty: 2 | Fill #1

## 2022-07-28 NOTE — Unmapped (Signed)
The [facility] has made a second and final attempt to reach this patient to refill the following medication:Repatha.      We have left voicemails on the following phone numbers: [phone] and have sent a Mychart questionnaire..    Dates contacted: 07/23/22, 07/28/22  Last scheduled delivery: 07/15/22    The patient may be at risk of non-compliance with this medication. The patient should call the [facility] at [phone]  Option 4, then Option 5: Cardiology, Endocrinology to refill medication.    [name]   [facility] Specialty Technician

## 2022-07-28 NOTE — Unmapped (Signed)
[facility] Refill Coordination Note    Specialty Medication(s) to be Shipped:   General Specialty: Repatha    Other medication(s) to be shipped: No additional medications requested for fill at this time     Jesus Rubio, DOB: Feb 25, 1946  Phone: [phone] (home)       All above HIPAA information was verified with patient's family member, wife.     Was a Nurse, learning disability used for this call? No    Completed refill call assessment today to schedule patient's medication shipment from the [facility] [phone]).  All relevant notes have been reviewed.     Specialty medication(s) and dose(s) confirmed: Regimen is correct and unchanged.   Changes to medications: Jesus Rubio reports no changes at this time.  Changes to insurance: No  New side effects reported not previously addressed with a pharmacist or physician: None reported  Questions for the pharmacist: No    Confirmed patient received a Conservation officer, historic buildings and a Surveyor, mining with first shipment. The patient will receive a drug information handout for each medication shipped and additional FDA Medication Guides as required.       DISEASE/MEDICATION-SPECIFIC INFORMATION        For patients on injectable medications: Patient currently has 0 doses left.  Next injection is scheduled for 08/14/22.    SPECIALTY MEDICATION ADHERENCE     Medication Adherence    Patient reported X missed doses in the last month: 0  Specialty Medication: REPATHA SURECLICK 140 mg/mL Pnij (evolocumab)  Patient is on additional specialty medications: No              Were doses missed due to medication being on hold? No      REFERRAL TO PHARMACIST     Referral to the pharmacist: Not needed      [facility]     Shipping address confirmed in Epic.       Delivery Scheduled: Yes, Expected medication delivery date: 08/08/22.     Medication will be delivered via Same Day Courier to the prescription address in Epic WAM.    [name]   [facility] Specialty Technician

## 2022-07-30 ENCOUNTER — Ambulatory Visit: Admit: 2022-07-30 | Discharge: 2022-07-31 | Payer: MEDICARE | Attending: Family Medicine | Primary: Family Medicine

## 2022-07-30 DIAGNOSIS — I2510 Atherosclerotic heart disease of native coronary artery without angina pectoris: Principal | ICD-10-CM

## 2022-07-30 DIAGNOSIS — L299 Pruritus, unspecified: Principal | ICD-10-CM

## 2022-07-30 DIAGNOSIS — R002 Palpitations: Principal | ICD-10-CM

## 2022-07-30 DIAGNOSIS — D500 Iron deficiency anemia secondary to blood loss (chronic): Principal | ICD-10-CM

## 2022-07-30 DIAGNOSIS — G629 Polyneuropathy, unspecified: Principal | ICD-10-CM

## 2022-07-30 DIAGNOSIS — E1142 Type 2 diabetes mellitus with diabetic polyneuropathy: Principal | ICD-10-CM

## 2022-07-30 LAB — ALBUMIN / CREATININE URINE RATIO
ALBUMIN QUANT URINE: 2.2 mg/dL
ALBUMIN/CREATININE RATIO: 6 ug/mg (ref 0.0–30.0)
CREATININE, URINE: 364.3 mg/dL

## 2022-07-30 MED ORDER — PRAMOXINE 1 % TOPICAL CREAM
0 refills | 0 days | Status: CP
Start: 2022-07-30 — End: ?

## 2022-07-30 MED ORDER — ISOSORBIDE MONONITRATE ER 30 MG TABLET,EXTENDED RELEASE 24 HR
ORAL_TABLET | Freq: Every day | ORAL | 1 refills | 90 days | Status: CP
Start: 2022-07-30 — End: ?

## 2022-07-30 NOTE — Unmapped (Signed)
Patient is requesting the following refill  Requested Prescriptions     Pending Prescriptions Disp Refills    isosorbide mononitrate (IMDUR) 30 MG 24 hr tablet [Pharmacy Med Name: Junious Dresser ER 30 MG TB] 90 tablet 1     Sig: TAKE 1 TABLET BY MOUTH EVERY DAY       Recent Visits  Date Type Provider Dept   04/28/22 Office Visit [name], MD [place] Primary [facility] At [facility]   02/24/22 Office Visit [name], MD [place] Primary Care At [facility]   02/13/22 Office Visit [name], MD [place] [facility] At [facility]   01/16/22 Office Visit [name], MD [place] [facility] At [facility]   12/19/21 Office Visit [name], MD [place] Primary Care At [facility]   11/13/21 Office Visit [name], MD [place] Primary Care At [facility]   10/22/21 Office Visit [name], MD [place] Primary Care At [facility]   08/08/21 Office Visit [name], MD [place] Primary Care At [facility]   Showing recent visits within past 365 days with a meds authorizing provider and meeting all other requirements  Today's Visits  Date Type Provider Dept   07/30/22 Appointment [name], MD  Primary Care At [facility]   Showing today's visits with a meds authorizing provider and meeting all other requirements  Future Appointments  No visits were found meeting these conditions.  Showing future appointments within next 365 days with a meds authorizing provider and meeting all other requirements       Labs: Not applicable this refill

## 2022-07-30 NOTE — Unmapped (Signed)
Health Maintenance Due   Topic Date Due    Zoster Vaccines (1 of 2) Never done    DTaP/Tdap/Td Vaccines (1 - Tdap) 04/08/2002    Medicare Annual Wellness Visit (AWV)  01/09/2019    COVID-19 Vaccine (1 - 2023-24 season) Never done    Retinal Eye Exam  12/13/2021    Urine Albumin/Creatinine Ratio  02/01/2022    Hemoglobin A1c  07/28/2022           Goals & Recommendations:    Goals and Recommendations related to your health are outlined below.    If discussed and provided at your visit today: Please, keep and record your blood sugar, blood pressure, weight, fluid intake, dietary intake and exercise amount and frequency.     Blood Pressure Goals (unless otherwise instructed):  Check 2-3 times a week.  Typical average out of office measurement: <135 mmHg systolic and < 85 mmHg diastolic (less than 135/85).  More strict recommendations for out of office measurement: < 130 mmHg systolic and < 80 mmHg diastolic (less than 130/80).  Typical in office measurement <140 mmHg systolic and <90 mmHg diastolic (less than 140/90).  Most people will not feel well if blood pressure drops to less than 110/70, unless they typically have blood pressures in this range.   If you are noticing sudden increases or decreases in blood pressure, then please contact the office.    Diabetes: Goal Hemoglobin A1c 7.9 or lower.  Goal Blood Sugars Fasting: 60-140 (may check fasting and two hours after a meal later in the day).    For Monitoring your Mood your PHQ-9 Goal is less than 10.    Drink 48 ounces of water per day.  Avoid sweetened beverages.  You will generally feel better and experience improved health when you limit, and if possible avoid, processed foods.  Diets high in plant based foods, vegetables and fruits are associated with many health benefits.    Exercise for a minimum of 20 minutes daily. Minimum of four days a week.   If possible try to obtain 60 minutes of exercise daily for five or more days per week.  Regular movement throughout the day is best.   Walking is a good form of movement.   Squats, lunges, step-ups, push-ups, planks, and arm exercises with or without weights or resistance bands are good forms of exercise.    If your BMI (body mass index) is above 25 then your body is carrying more weight than is typically healthy.  If your BMI (body mass index) is above 25 and you are not achieving the healthy weight goals that you desire, then consider the following:  Keep calories below 2500 calories per day unless otherwise instructed. Remember that all calories are not equal.  Keep a food journal and record accurately.  Read labels and ingredient lists on the products that you consume.  Sometimes we need to adjust your level of physical activity, and or tailor an exercise program more specifically for your needs, just ask and we will come up with a plan.  If you have questions about any of these recommendations, please ask.    If you smoke, stop.  If you have heart failure or severe kidney disease: weigh yourself daily and maintain fluid intake to less than 2 Liters daily unless otherwise instructed.  If you have diabetes you need to see the eye doctor at least every one to two years and have a yearly foot exam.  Set aside time daily  for relaxation.  Plan a pleasant activity that you can look forward to and enjoy.  Medication changes are outlined on your After Visit Summary.  Any referrals placed today will be outlined on your After Visit Summary.    If you have questions, please call the office or use MyChart.   If you need a refill on a medication, please contact your pharmacy.  Please, allow at least two business days for responses to refill requests.    Please, bring all medications and when possible original prescription containers to each and every office visit.    If you have been provided and / or requested to record your blood sugars, blood pressures, weight, fluid intake, dietary intake, or exercise, then please bring records to each and every appointment.    When you see other providers for health care, please request that they send information and records related to your visit to our office. This includes vaccinations, mammograms, radiology, procedures, diagnostic testing, lab testing and physical exams or wellness exams that you have outside of the [facility].      [facility] at [facility]  [address], [address]  [place], [place] [address]  Office: [phone]  Fax: [phone]               If a lab, test, imaging order or referral was placed today it will show on your AVS.    Labs:  Please obtain labs today prior to leaving, unless otherwise instructed.  If you have been asked to obtain labs at a later date, please schedule a Lab Visit appointment.    X-rays (plain films):    Plain film (traditional) x-rays do not require scheduling at [facility] or elsewhere in the [facility] system.  If you had an x-ray ordered today, our office recommends going to get the x-ray performed as soon as you can, unless otherwise instructed.  If you are obtaining X-rays (plain films) outside of the [facility] system, then please verify with your health care provider so that we can assist in coordinating any needed scheduling / appointments.    Referrals (referral for specialty consultation), Imaging (CT, MRI, Ultrasounds, Cardiac Testing, Other Imaging, Spirometry, Sleep Studies):  If there is a number on your AVS today for a location to call, then calling this number is recommended.  If there is not a number to call, then you will be contacted via your listed preferred phone number, text and or MyChart.  If you are signed up for [facility], then some information may be directed to your MyChart account as the first line of contact. For example, [place] may contact you to schedule an appointment for a referral or test first via your MyChart account. If you have signed up previously for a [facility] MyChart account, though you do not currently access or know how to access, then please let a member of our office staff know. If you would like to set up a [facility] MyChart account please see the instructions near the end of your After Visit Summary. If you need assistance with [facility], you may call the [facility] at [phone].   Phone calls to schedule through [facility] may come from [phone] or [phone] area codes.  If you have not heard regarding scheduling of your referral in the next 7 days, then please contact our office at [phone] option 5.    Insurance Specific Considerations:  Please review  information from your insurance provider regarding scheduling and coverage for specialty care, procedures and imaging.  If you know that your insurance specifically requires a referral or authorization prior to scheduling with a specialty care provider or for a test, please contact our office prior to scheduling at [phone] option 5.    Forms:  We are happy to assist with any forms you may need. We typically complete most forms within 7 business days.  For all forms that you need our office to complete, please review the forms in their entirety first, complete any areas that are required by you including any necessary signatures prior to leaving the forms with our office. Please note that many forms require signatures from patients related to consent for a health care provider or office to complete the form, where required this if often outlined on the forms that you would be submitting. Please also note the time frames for requested return of forms prior to providing to our office as well as any supporting documentation that you may need to provide. The most common reason for delayed completion of forms or return of forms to patients or submissions elsewhere are forms which are not signed by the patient where needed or forms requested for which we do not have supporting documentation or where additional information is needed prior to our completion of the requested forms. Please also note that there are instances beyond our control where forms may not be available within 7 business days.    Refills:  Please allow 3 business days for any refill requests to be processed. Please contact your pharmacy for any refills and to check the status on any refill request. In some instances your insurance may require a prior approval for certain medications. In those cases, refills are processed after we receive approval and this could possibly lengthen the turnaround time.    No Show and Late arrivals:  Please call the office when you need to cancel or reschedule an appointment, not doing so places the appointment as a no-show. If you have 3 or more no shows in a year, you may be asked to locate another provider outside of our office. If you arrive 15 minutes after your appointment time, you may be asked to reschedule.    Phone:  Our phone calls are answered Monday-Friday 8:00 am to 5:00 pm, by a staff member. Calls outside of these times and on weekends, will be answered by a registered nurse with [facility]. If needed, they will reach out to providers on call. Refills are not processed after normal business hours.     Coordination of Care:  When you see other providers for health care, please request that they send information and records related to your visit to our office. This includes vaccinations, mammograms, radiology, procedures, diagnostic testing, lab testing and physical exams or wellness exams that you have outside of the [facility] system. Our contact information is outlined below. Please periodically review your preferred contact information, preferred methods of contact and individuals listed on your contact list in our records. We recommend when possible having at least two phone numbers available for contact. If you have a MyChart account, please access and review your account periodically to update information and check for any new information that may have been sent to you.     Planning for your next visit:  Please bring any needed information to update your insurance, payment records, and preferred contact information.  Updated Medication List including supplements  and over the counter medications.  Any information related to health care visits elsewhere since your last visit, including: urgent cares, emergency departments, specialist offices, or other imaging or procedures.    We Acknowledge:  We acknowledge that while technology has made great progress in allowing [place] to communicate more and has improved some of the connectivity of health care and health care providers, these systems are not perfect. If any questions exist please always ask and if anything is unclear please let us know. In regards to tests, labs, imaging and recommendations for referrals or other procedures from our office, you should always hear something as far as an appointment, a result, a recommendation or a next step.    Billing:  For questions regarding billing please call [phone] option #4.  If you are signed up for [facility], then all billing statements will come to you directly by way of your [facility] MyChart account. Beginning in September 2023, if you are signed up for [facility], you will not receive a mailed paper billing statement from [facility]. If you have signed up previously for a [facility] MyChart account, though you do not currently access or know how to access, then please let a member of our office staff know. If you would like to set up a [facility] MyChart account please see the instructions near the end of your After Visit Summary. If you need assistance with your [facility], you may call the [facility] at [phone].     Inclement Weather:  During times of inclement weather please call the office to check on any changes in hours of operation.    Survey:  Thank you for the chance to participate in your health care.  Soon you should be receiving a survey about your recent visit to [facility] [place].  Please take a moment to complete this survey.  We take your feedback very seriously and will use it to guide our continuous improvement efforts.     [facility] at [facility]  [address]  [place], [place] [address]  Office: [phone]  Fax: [phone]           Learning About Mindfulness for Stress  What are mindfulness and stress?    Stress is what you feel when you have to handle more than you are used to. A lot of things can cause stress. You may feel stress when you go on a job interview, take a test, or run a race. This kind of short-term stress is normal and even useful. It can help you if you need to work hard or react quickly.  Stress also can last a long time. Long-term stress is caused by stressful situations or events. Examples of long-term stress include long-term health problems, ongoing problems at work, and conflicts in your family. Long-term stress can harm your health.  Mindfulness is a focus only on things happening in the present moment. It's a process of purposefully paying attention to and being aware of your surroundings, your emotions, your thoughts, and how your body feels. You are aware of these things, but you aren't judging these experiences as good or bad. Mindfulness can help you learn to calm your mind and body to help you cope with illness, pain, and stress.  How does mindfulness help to relieve stress?  Mindfulness can help quiet your mind and relax your body. Studies show that it can help some people sleep  better, feel less anxious, and bring their blood pressure down. And it's been shown to help some people live and cope better with certain health problems like heart disease, depression, chronic pain, and cancer.  How do you practice mindfulness?  To be mindful is to pay attention, to be present, and to be accepting.  When you're mindful, you do just one thing and you pay close attention to that one thing. For example, you may sit quietly and notice your emotions or how your food tastes and smells.  When you're present, you focus on the things that are happening right now. You let go of your thoughts about the past and the future. When you dwell on the past or the future, you miss moments that can heal and strengthen you. You may miss moments like hearing a child laugh or seeing a friendly face when you think you're all alone.  When you're accepting, you don't judge the present moment. Instead you accept your thoughts and feelings as they come.  You can practice anytime, anywhere, and in any way you choose. You can practice in many ways. Here are a few ideas:  While doing your chores, like washing the dishes, let your mind focus on what's in your hand. What does the dish feel like? Is the water warm or cold?  Go outside and take a few deep breaths. What is the air like? Is it warm or cold?  When you can, take some time at the start of your day to sit alone and think.  Take a slow walk by yourself. Count your steps while you breathe in and out.  Try yoga breathing exercises, stretches, and poses to strengthen and relax your muscles.  At work, if you can, try to stop for a few moments each hour. Note how your body feels. Let yourself regroup and let your mind settle before you return to what you were doing.  If you struggle with anxiety or worry thoughts, imagine your mind as a blue sky and your worry thoughts as clouds. Now imagine those worry thoughts floating across your mind's sky. Just let them pass by as you watch.  Follow-up care is a key part of your treatment and safety. Be sure to make and go to all appointments, and call your doctor if you are having problems. It's also a good idea to know your test results and keep a list of the medicines you take.  Where can you learn more?  Go to [facility] at [email or web address]  Select Preferences in the upper right hand corner, then select Health Library under Resources. Enter [id number] in the search box to learn more about Learning About Mindfulness for Stress.  Current as of: January 15, 2016  Content Version: 11.6  ?? 2006-2018 [employer]. Care instructions adapted under license by [facility]. If you have questions about a medical condition or this instruction, always ask your healthcare professional. Health[employer], Incorporated disclaims any warranty or liability for your use of this information.         Learning About Guided Imagery for Stress  What are guided imagery and stress?    Stress is what you feel when you have to handle more than you are used to. A lot of things can cause stress. You may feel stress when you go on a job interview, take a test, or run a race. This kind of short-term stress is normal and even useful. It can help you if you need to work hard  or react quickly.  Stress also can last a long time. Long-term stress is caused by stressful situations or events. Examples of long-term stress include long-term health problems, ongoing problems at work, and conflicts in your family. Long-term stress can harm your health.  Guided imagery is a technique of directed thoughts and suggestions that guide your mind toward a relaxed, focused state. This technique helps you use your mind to take you to a calm, peaceful place. You can use it to relax, which can lower blood pressure and reduce other problems related to stress.  How does guided imagery help to relieve stress?  Because of the way the mind and body are connected, guided imagery can make you feel like you are experiencing something just by imagining it. You can achieve a relaxed state when you imagine all the details of a safe, comfortable place, such as a beach or a garden. This relaxed state may help you feel more in control of your emotions and thought processes. Feeling in control may improve your attitude, health, and sense of well-being.  How do you do guided imagery?  You can use a smartphone app or a video to lead you through the process. You use all of your senses in guided imagery. For example, if you want a tropical setting, you can imagine the warm breeze on your skin, the bright blue of the water, the sound of the surf, the sweet scent of tropical flowers, and the taste of coconut. Imagining those things can make you actually feel like you're there.  But you don't have to imagine the tropics to feel peace. Guided imagery can take you to your own restful place. To give guided imagery a try, follow these steps:  Lean back comfortably in your chair. If you can, close your eyes. Put your arms on the armrests, or fold your hands in your lap.  Take a deep breath through your nose. Breathe in slowly, and then let the air out completely through your mouth.  Do it again slowly. Keep breathing like this. Gather up any tension in your body, and send it out with every breath.  Let a feeling of warmth spread from your lungs to your neck and head, down your arms to your fingertips, through your body and into your legs, all the way down to your toes. Stay this way for a moment.  Now imagine a pleasant day. You're at a park or at a place you've visited in the past where you felt at peace.  In your mind's eye, look at what lies in front of you. Maybe you see the sun, filtered through trees. Maybe clouds are drifting by.  Look to one side, and then the other. Notice the feel of the air around you. Notice how it feels on your face and on your arms.  Stay here for a while. Let it become real for you.  Follow-up care is a key part of your treatment and safety. Be sure to make and go to all appointments, and call your doctor if you are having problems. It's also a good idea to know your test results and keep a list of the medicines you take.  Where can you learn more?  Go to [facility] at [email or web address]  Select Preferences in the upper right hand corner, then select Health Library under Resources. Enter N291 in the search box to learn more about Learning About Guided Imagery for Stress.  Current as of: January 15, 2016  Content Version: 11.6  ??  2006-2018 [employer], [employer]. Care instructions adapted under license by [facility]. If you have questions about a medical condition or this instruction, always ask your healthcare professional. Health[employer], Incorporated disclaims any warranty or liability for your use of this information.        Learning About the Mediterranean Diet  What is the Mediterranean diet?    The Mediterranean diet is a style of eating rather than a diet plan. It features foods eaten in [place], [place], southern [place] and [place], and other countries along the [employer]. It emphasizes eating foods like fish, fruits, vegetables, beans, high-fiber breads and whole grains, nuts, and olive oil. This style of eating includes limited red meat, cheese, and sweets.  Why choose the Mediterranean diet?  A Mediterranean-style diet may improve heart health. It contains more fat than other heart-healthy diets. But the fats are mainly from nuts, unsaturated oils (such as fish oils and olive oil), and certain nut or seed oils (such as canola, soybean, or flaxseed oil). These fats may help protect the heart and blood vessels.  How can you get started on the Mediterranean diet?  Here are some things you can do to switch to a more Mediterranean way of eating.  What to eat  Eat a variety of fruits and vegetables each day, such as grapes, blueberries, tomatoes, broccoli, peppers, figs, olives, spinach, eggplant, beans, lentils, and chickpeas.  Eat a variety of whole-grain foods each day, such as oats, brown rice, and whole wheat bread, pasta, and couscous.  Eat fish at least 2 times a week. Try tuna, salmon, mackerel, lake trout, herring, or sardines.  Eat moderate amounts of low-fat dairy products, such as milk, cheese, or yogurt.  Eat moderate amounts of poultry and eggs.  Choose healthy (unsaturated) fats, such as nuts, olive oil, and certain nut or seed oils like canola, soybean, and flaxseed.  Limit unhealthy (saturated) fats, such as butter, palm oil, and coconut oil. And limit fats found in animal products, such as meat and dairy products made with whole milk. Try to eat red meat only a few times a month in very small amounts.  Limit sweets and desserts to only a few times a week. This includes sugar-sweetened drinks like soda.  The Mediterranean diet may also include red wine with your meal--1 glass each day for women and up to 2 glasses a day for men.  Tips for eating at home  Use herbs, spices, garlic, lemon zest, and citrus juice instead of salt to add flavor to foods.  Add avocado slices to your sandwich instead of bacon.  Have fish for lunch or dinner instead of red meat. Brush the fish with olive oil, and broil or grill it.  Sprinkle your salad with seeds or nuts instead of cheese.  Cook with olive or canola oil instead of butter or oils that are high in saturated fat.  Switch from 2% milk or whole milk to 1% or fat-free milk.  Dip raw vegetables in a vinaigrette dressing or hummus instead of dips made from mayonnaise or sour cream.  Have a piece of fruit for dessert instead of a piece of cake. Try baked apples, or have some dried fruit.  Tips for eating out  Try broiled, grilled, baked, or poached fish instead of having it fried or breaded.  Ask your server to have your meals prepared with olive oil instead of butter.  Order dishes made with marinara sauce or sauces made from olive oil. Avoid sauces made  from cream or mayonnaise.  Choose whole-grain breads, whole wheat pasta and pizza crust, brown rice, beans, and lentils.  Cut back on butter or margarine on bread. Instead, you can dip your bread in a small amount of olive oil.  Ask for a side salad or grilled vegetables instead of french fries or chips.  Where can you learn more?  Go to [facility] at [email or web address]  [facility] under the Resources menu. Enter O407 in the search box to learn more about Learning About the Mediterranean Diet.  Current as of: February 11, 2017  Content Version: 12.2  ?? 2006-2019 [employer]. Care instructions adapted under license by [facility]. If you have questions about a medical condition or this instruction, always ask your healthcare professional. [employer] disclaims any warranty or liability for your use of this information.            Stretching: Exercises  Your Care Instructions  Here are some examples of exercises for stretching. Start each exercise slowly. Ease off the exercise if you start to have pain.  Your doctor or physical therapist will tell you when you can start these exercises and which ones will work best for you.  How to do the exercises  Latissimus stretch    Stand with your back straight and your feet shoulder-width apart. You can do this stretch sitting down if you are not steady on your feet.  Hold your arms above your head, and hold one hand with the other.  Pull upward while leaning straight over toward your right side. Keep your lower body straight. You should feel the stretch along your left side.  Hold 15 to 30 seconds, and then switch sides.  Repeat 2 to 4 times for each side.  Triceps stretch    Stand with your back straight and your feet shoulder-width apart. You can do this stretch sitting down if you are not steady on your feet.  Bring your left elbow straight up while bending your arm.  Grab your left elbow with your right hand, and pull your left elbow toward your head with light pressure. If you are more flexible, you may pull your arm slightly behind your head. You will feel the stretch along the back of your arm.  Hold 15 to 30 seconds, and then switch elbows.  Repeat 2 to 4 times for each arm.  Calf stretch    Place your hands on a wall for balance. You can also do this with your hands on the back of a chair, a countertop, or a tree.  Step back with your left leg. Keep the leg straight, and press your left heel into the floor.  Press your hips forward, bending your right leg slightly. You will feel the stretch in your left calf.  Hold the stretch 15 to 30 seconds.  Repeat 2 to 4 times for each leg.  Quadriceps stretch    Lie on your side with one hand supporting your head.  Bend your upper leg back and grab your ankle with your other hand.  Stretch your leg back by pulling your foot toward your buttocks. You will feel the stretch in the front of your thigh. If this causes stress on your knees, do not do this stretch.  Hold the stretch 15 to 30 seconds.  Repeat 2 to 4 times for each leg.  Groin stretch    Sit on the floor and put the soles of your feet together. Do not slump  your back.  Grab your ankles and gently pull your legs toward you.  Press your knees toward the floor. You will feel the stretch in your inner thighs.  Hold 15 to 30 seconds.  Repeat 2 to 4 times.  Hamstring stretch in doorway    Lie on the floor near a doorway, with your buttocks close to the wall.  Let the leg you are not stretching extend through the doorway.  Put the leg you want to stretch up on the wall, and straighten your knee to feel a gentle stretch at the back of your leg.  Hold the stretch for at least 15 to 30 seconds. Repeat 2 to 4 times.  Follow-up care is a key part of your treatment and safety. Be sure to make and go to all appointments, and call your doctor if you are having problems. It's also a good idea to know your test results and keep a list of the medicines you take.  Where can you learn more?  Go to [facility] at [email or web address]  Select Preferences in the upper right hand corner, then select Health Library under Resources. Enter P733 in the search box to learn more about Stretching: Exercises.  Current as of: March 13, 2016  Content Version: 11.6  ?? 2006-2018 [employer]. Care instructions adapted under license by [facility]. If you have questions about a medical condition or this instruction, always ask your healthcare professional. [employer] disclaims any warranty or liability for your use of this information.

## 2022-08-03 NOTE — Unmapped (Signed)
Assessment/Plan     Diagnoses and all orders for this visit:    Type 2 diabetes mellitus with diabetic polyneuropathy, without long-term current use of insulin (CMS-HCC)  Stable overall. Well controlled overall. Continue current regimen.  -     Albumin/creatinine urine ratio; Future  -     POCT glycosylated hemoglobin (Hb A1C)    Palpitations  -     ECG 12 Lead  Premature atrial contractions.  Standard education and anticipatory guidance regarding goals, objectives, and sequelae.    Coronary artery disease involving native coronary artery of native heart, unspecified whether angina present  Clinically asymptomatic.   Standard education and anticipatory guidance regarding goals, objectives, and sequelae.  Continue current management and plan of care.  Continue aggressive risk factor reduction.    Peripheral polyneuropathy  Clinically stable.  Continue current plan of care.    Iron deficiency anemia due to chronic blood loss  Clinically asymptomatic. No evidence of ongoing blood loss.    Pruritus  Scrotal itching.  No evidence of current infectious etiology.  No evidence of current yeast.  Treat with pramoxine.  -     pramoxine 1 % Crea; Apply up to three times daily. Limit to 7 days consistent use.      Disposition: Return in about 3 months (around 10/29/2022) for Recheck, DM.  10/29/2022      Diagnoses and plan along with any newly prescribed medication(s), recommendations, orders and therapies were discussed in detail with patient today.   Standard education and anticipatory guidance regarding goals, objectives and potential sequelae were reviewed. Patient voiced appropriate understanding of assessment and plan of care.    Pertinent handouts were given today and reviewed with the patient as indicated.  The Care Plan and Self-Management goals have been included on the AVS and the AVS has been printed.  Where helpful for monitoring and management I have encouraged the patient to keep regular logs for me to review at their next visit. Any outside resources or referrals needed at this time are noted above. No additional referrals necessary at this time. Patient voiced understanding and all questions have been answered to satisfaction.     To complete this documentation in a timely manner, portions of this encounter may have been completed utilizing Dragon voice recognition software. Please take this into consideration in noting nuances of documentation, grammar, punctuation, and wording. If and when a question exists, or for clarification, please contact the author.    Barron Alvine, MD  [facility]  [address]  [place], [name] [address]  Phone [phone]    Subjective:     DGL:OVFIEP, Jesus Beam, MD    Jesus Rubio, is a 77 y.o. male    Chief Complaint   Patient presents with     Diabetic Neuropathy     Patient states he does have numbness and tingling in his hand.     Groin Pain     Patient states that he has a really bad itch in his groin. Patient states that he has been putting cream and nothing helps. Patient states he is having flaky skin in his groin.     Palpitations     Patient states he has been having palpations recently. Patient states he has had a little SOB       HPI:    Patient presents: Alone.    Nursing notes and history reviewed with patient.    Diabetes mellitus type 2.  Goal A1c less than 8.  No hypo or hyperglycemic symptoms.  Compliant medication regimen denies side effects.    Palpitations.  Noted recently today.  No chest pain or shortness of breath.  No presyncope or dizziness.    Coronary artery disease. Compliant with therapy. No chest pain, shortness of breath, orthopnea, PND.  No increase nitroglycerin use.  No bleeding complications from aspirin therapy noted.    Peripheral neuropathy.  Most recently clinically stable.    Iron deficiency anemia due to chronic blood loss.  Denies concerning related symptoms.  No evidence ongoing blood loss.    Pruritus of the scrotum.  No other areas of itching.  Has used topical antifungals without benefit.  Mainly is scrotal skin.      No other complaints.  No additional recent health care visits elsewhere. No urgent care or ED visits.  No additional palliative, provocative, modifying, quantifying, qualifying, or other related factors.  Unless otherwise outlined above patient is pleased with current medication regimen, admits compliance and denies side effects of medication.    The following portions of the patient's history were reviewed and updated as appropriate: allergies, current medications, past family history, past medical history, past social history, past surgical history and problem list.    ROS: Pertinent positives and negatives are as outlined in the HPI above. The remaining balance of the 12 point review of systems is otherwise benign.      [facility] Components:      Goals         <130/80       Lack of exercise or physical activity    Increase exercise or physical activity        Get heart rate stabilized (pt-stated)         Things to think about to help me reach my goal:     What are you going to do? Take medications as prescribed, Keep positive thinking, Pace activities, Stay hydrated   How and how much? Daily   How frequent? Daily   Barriers to success?    Solutions to barriers?      06/04/17: Patient continues to work on this goal   09/29/17 Patient continues to work on this goal   12-09-2017: Reports he continues to work on this and is staying positive.        Take actions to prevent falling       12-03-2016: New goal: Will change positions slowly and pay attention to pathways. Uses cane or walker at times.   09/29/17 Patient continues to work on this goal   12-09-2017: Reports 1 fall 6-8 months ago when he missed a step and fell. Denies injury. Uses cane occasionally.              Family characteristics, patient's social characteristics, patient's cultural characteristics, patient's communication needs, patient's health literacy and behaviors affecting patient's health are outlined under the patient's History Section, Longitudinal Care Plan or Problem List in Epic or as pertinent in History of Present Illness.    Barriers to goals identified and addressed.   See Assessment and Plan for additional pertinent details.    Objective:     Physical Exam   BP 124/72  - Pulse 81  - Temp 36.8 ??C (98.2 ??F) (Oral)  - Resp 16  - Ht 182.9 cm (6' 0.01)  - Wt (!) 105.9 kg (233 lb 8 oz)  - SpO2 98%  - BMI 31.66 kg/m??   Constitutional: Oriented to person, place, and  time. Appears well-developed and well-nourished. No distress.   Head: Normocephalic and atraumatic.   Mouth/Throat: Oropharynx is without mass or concerning lesions. Moist mucous membranes.    Eyes: Conjunctivae are normal. No scleral icterus.   Neck: Neck supple. Normal range of motion. No JVD present. No thyromegaly present. No concerning neck mass.   Cardiovascular: Normal rate. Regular rhythm. Normal heart sounds. No murmur heard.  Pulmonary/Chest: Effort normal. No respiratory distress. Breath sounds normal. No wheezes. No rhonchi. No rales. No focal breath sound changes. No chest tenderness.  Abdominal: Soft. Non-distended. No abdominal mass. No tenderness. No rebound. No guarding.   GU: Normal male phallus. Two symmetric scrotal testes. No masses. No hernias.  No concerning scrotal rash.  No inguinal rash.  Musculoskeletal: No active synovitis. No concerning joint erythema, warmth or swelling.   Range of motion is without evidence of significant impairment.   Strength is without evidence of significant impairment.   No edema of the extremities.   Lymphadenopathy: There is no lymphadenopathy about the head or neck.  Neurological: Alert and oriented to person, place, and time. No cranial nerve deficit. Normal muscle tone. Normal coordination.  Skin: Skin is warm and dry. No pallor. No rash noted.   Psychiatric: Normal mood, affect and behavior. Judgment and thought content normal.

## 2022-08-08 MED FILL — REPATHA SURECLICK 140 MG/ML SUBCUTANEOUS PEN INJECTOR: SUBCUTANEOUS | 28 days supply | Qty: 2 | Fill #2

## 2022-08-10 NOTE — Unmapped (Signed)
Urine testing for diabetes related kidney changes is normal range.

## 2022-08-14 ENCOUNTER — Ambulatory Visit: Admit: 2022-08-14 | Discharge: 2022-08-15 | Payer: MEDICARE | Attending: Internal Medicine | Primary: Internal Medicine

## 2022-08-14 DIAGNOSIS — I771 Stricture of artery: Principal | ICD-10-CM

## 2022-08-14 DIAGNOSIS — E1169 Type 2 diabetes mellitus with other specified complication: Principal | ICD-10-CM

## 2022-08-14 DIAGNOSIS — E785 Hyperlipidemia, unspecified: Principal | ICD-10-CM

## 2022-08-14 DIAGNOSIS — I701 Atherosclerosis of renal artery: Principal | ICD-10-CM

## 2022-08-14 DIAGNOSIS — I2089 Chronic stable angina (CMS-HCC): Principal | ICD-10-CM

## 2022-08-14 DIAGNOSIS — I152 Hypertension secondary to endocrine disorders: Principal | ICD-10-CM

## 2022-08-14 DIAGNOSIS — I259 Chronic ischemic heart disease, unspecified: Principal | ICD-10-CM

## 2022-08-14 DIAGNOSIS — I480 Paroxysmal atrial fibrillation: Principal | ICD-10-CM

## 2022-08-14 DIAGNOSIS — I240 Acute coronary thrombosis not resulting in myocardial infarction: Principal | ICD-10-CM

## 2022-08-14 DIAGNOSIS — E1159 Type 2 diabetes mellitus with other circulatory complications: Principal | ICD-10-CM

## 2022-08-14 MED ORDER — ISOSORBIDE MONONITRATE ER 60 MG TABLET,EXTENDED RELEASE 24 HR
ORAL_TABLET | Freq: Every day | ORAL | 3 refills | 90 days | Status: CP
Start: 2022-08-14 — End: 2023-08-14

## 2022-08-14 NOTE — Unmapped (Signed)
[facility] CARDIOLOGY AT [facility]  [address] - Phone: [phone]  [place], [name] [address] - Fax: [phone]     Date of Service: 08/14/2022    PCP: [name], MD  Referring Provider: [name], PA    ASSESSMENT and PLAN:     Jesus Rubio is a 77 y.o. male presenting at the request of [name], PA for evaluation and management of:  1. Chronic stable angina (CMS-HCC)    2. 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 Hypertension associated with diabetes (CMS-HCC)    4. Hyperlipidemia associated with type 2 diabetes mellitus (CMS-HCC)    5. Type 2 diabetes mellitus with other specified complication, without long-term current use of insulin (CMS-HCC)    6. Paroxysmal atrial fibrillation (CMS-HCC)    7. Renal artery stenosis (CMS-HCC)    8. Subclavian arterial stenosis (CMS-HCC)      Jesus Rubio presents in follow up.  I last saw him 04/2022. He overall is stable. He is having to do more of the household work as his wife fell and broke her L arm. He has been having some more CP than before but has taken nitroglycerin 6-8 times since I saw him last. Nitroglycerin typically works after 1-2 doses. He has not had palpitations. He has had labs done in January which I reviewed showing Hgb improved and Cr improved as well from last check in Nov 2023. LDL is excellent at 33 on repatha. On medication review noted he was prescribed imdur 30mg  instead of 60mg . I don't have documentation that we made this change so we will increase back to 60mg . BP is controlled as measured in the L arm (note R subclavian stenosis). He will let me know if CP is getting worse. Plan to follow up in 2 months.    1. 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 NSTEMI while hospitalized for COVID, managed medically with plavix. Plavix was later discontinued in favor of eliquis due to PE  - anti-platelets: plavix 6 months post PCI renal artery stenosis => switch to aspirin 12/2021  - anti-anginal: metoprolol succinate 100mg  BID, imdur 60mg  daily  - statin: atorvastatin 40mg  daily => 10mg  ?muscle aches on higher dose => crestor 20mg  daily => Repatha, stop crestor  - last echo: 05/12/20 normal EF  - last stress/angiography:04/2021 see below. New since 2016 is CTO left main    2. Afib and Atrial flutter s/p ablation x2. Recurrence of paroxysmal Afib during hospitalization for COVID19 03/2020-04/2020. ER visit 02/2021 for symptomatic afib. No clear trigger.  - CHADS2-Vasc score: 6 (HTN, age+2, PE and CVA+2, IHD)  - Anti-coagulation: Eliquis  - Rate control: metoprolol as above, additional 50mg  prn dose if needed for sustained palpitations  - Rhythm control: not indicated    3. COVID19 with lengthy, complicated hospitalization 12/21-1/22. ARDS, NSTEMI, GI bleed, ARF requiring dialysis, occipital lobe CVA, PE  - has SOB with exertion, waiting for home O2    4. History of PE  - on eliquis    5. R subclavian stenosis  - BP in left arm    6. 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     [name], MD, PhD  [facility] Cardiology      ORDERS THIS VISIT:  No orders of the defined types were placed in this encounter.     New Prescriptions    ISOSORBIDE MONONITRATE (IMDUR) 60 MG 24 HR TABLET    Take 1 tablet (60 mg total) by mouth daily.      Modified Medications    No medications on file      Discontinued Medications    ISOSORBIDE MONONITRATE (IMDUR) 30 MG 24 HR TABLET    TAKE 1 TABLET BY MOUTH EVERY DAY        SUBJECTIVE:     ID: Jesus Rubio is a 77 y.o. male with a history of CAD s/p CABG, aflutter s/p ablation, COVID19 with numerous complications during hospitalization 03/2020-04/2020    Reason for Visit: Coronary Artery Disease (4 month checkup)     Cardiovascular History:    Previously evaluated by [name] and last saw him 05/2019. He has a history of CABG and aflutter s/p ablation. Ziopatch 06/2019 did not show recurrence of aflutter. Prolonged hospitalization at [facility] from 03/17/20 to 04/17/20 for COVID19 with hypoxic respiratory failure due to ARDS, bilateral subsegmental PE, GI bleed requiring transfusion, NSTEMI, afib, L occipital lobe CVA.    Presented to [facility] ER 07/29/20 after an episode of syncope. ECG showed sinus rhythm with RBBB. A ziopatch was ordered. Ziopatch showed 16 short SVT episodes, appear to be atrial tach.    12/2020 right sided rib fractures    02/26/21 ER visit for afib with RVR and HTN. Prescribed prn diltiazem by ER in addition to BID metoprolol.    04/2021 PET stress for evaluation of CP, showing large reversible apical/anterior/septal defect. Referred for [facility]): interval proximal occlusion of the left main, LIMA is patent but mid LAD occlusion prevents backfilling of the proximal LAD. Unsuccessful attempt to pass a wire through CTO left main. All grafts patent. RAS 70% on the R. Also found to have differential of >31mmHg SBP/DBP between L and R arm.     05/2021 reviewed case with [name]. Plan for treatment of RAS. Do not think coronary intervention will be worth the risk. S/p DES to right renal artery. Angiography also showed right subclavian stenosis.    Interval History:    Chest discomfort yesterday. Took nitroglycerin x2. Resolved. Feels fine today.     Has taken ntg maybe 6-8 times since January 2024.     Has been more active than usual because wife got injured, fall broken arm. He has to do housework now.        Review of Systems  10 systems were reviewed and negative except as noted in HPI.    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    Prior Cardiovascular Interventions / Surgery:  CABG 2006 LIMA-LAD,  SVG-OM, SVG-PDA  Aflutter ablation 2016, and 2017  R renal artery PCI 05/2021 [place]    Prior Cardiovascular Diagnostics:    ECG 06/28/20 sinus rhythm 77bpm, RBBB with non-specific ST/TW abnormalities  ECG 02/26/21 afib 120bpm, RBBB  ECG 03/05/21 sinus rhythm with PACs, RBBB  ECG 04/04/21 sinus rhythm with PACs, RBBB  ECG 07/04/21 sinus rhythm, RBBB    Echo 04/11/20 EF normal  Echo 12/2020 normal EF, normal RV  Echo 10/2021 normal EF    Ziopatch 07/2020  - 16 short SVT episodes were recorded, the longest lasting 7.7 seconds. These appear to be atrial tachycardia.   - Otherwise unremarkable 14 day ambulatory cardiac monitor without patient triggered events.     PET Stress 04/2021  - Abnormal myocardial perfusion study  - There is a large in size, moderately severe, completely reversible perfusion defect involving the apical, apical anterior, mid anterior, basal anterior, apical septal, mid anteroseptal, basal anteroseptal, mid inferoseptal and basal inferoseptal segments. This is consistent with probable ischemia.  - Left ventricular systolic function is normal. Post stress the ejection fraction is > 60%.  - Coronary and aortic calcifications are noted  - Aortic valve calcifications are noted  - Attenuation CT scan shows post CABG findings  - Status post cholecystectomy  - Nonspecific bilateral reticular changes in the lungs, including areas of atelectasis    LHC 05/04/21  Small area of lateral wall hypokinesis with preserved LV systolic function.  Coronary artery disease including proximal occlusion of the left main and long 70% lesion in the mid-RCA.  Prior coronary bypass surgery with patent LIMA to distal LAD, patent SVG to OM1 and OM2 and patent SVG to PDA.   70% right renal artery stenosis. Widely patent left renal artery.  Interval change since angiography on 07/14/14 was total occlusion of left main. Proximal LAD was now supplied by collaterals from PDA since occlusion of mid-LAD (present in 2016) prevented backfilling of the proximal LAD from LIMA to LAD graft.  Unsuccessful attempt to pass a wire through totally occluded left main.      Other Surgical History:   has a past surgical history that includes Replacement total knee; Cholecystectomy; Thyroidectomy, partial; Prostate surgery; Foot arthrodesis, triple; Cardiac surgery (2006); Joint replacement; pr upper gi endoscopy,biopsy (N/A, 12/01/2013); pr compre ep eval abltj 3d mapg tx svt (N/A, 05/16/2015); Knee arthroscopy; pr colonoscopy flx dx w/collj spec when pfrmd (N/A, 04/03/2020); pr gi imag intraluminal esophagus-ileum w/i&r (N/A, 04/05/2020); pr cath place/coron angio, img super/interp,w left heart ventriculography (N/A, 05/03/2021); and pr revascularize fem/pop artery,angioplasty/stent (N/A, 06/03/2021).    Current Outpatient Medications   Medication Instructions    acetaminophen (TYLENOL) 650 mg, Oral, Every 6 hours PRN    albuterol HFA 90 mcg/actuation inhaler 2 puffs, Inhalation, Every 6 hours PRN    apixaban (ELIQUIS) 5 mg, Oral, 2 times a day (standard)    aspirin (ECOTRIN) 81 mg, Oral, Daily (standard)    blood sugar diagnostic (ONETOUCH VERIO TEST STRIPS) Strp USE TO CHECK BLOOD SUGAR 1-2 TIMES PER DAY    blood-glucose meter kit Check BS every day    capsaicin (ZOSTRIX) 0.025 % cream Topical, 2 times a day (standard), Use gloves to apply. Wash hands after use. Avoid contact with face, skin folds and mucous membranes.    clotrimazole (LOTRIMIN) 1 % cream Topical, 2 times a day (standard)    cyanocobalamin (vitamin B-12) 1,000 mcg, Subcutaneous, Every 30 days dilTIAZem (CARDIZEM) 30 MG tablet 1 tab Q3 hr PRN bad palpitations, HR>110.  Be sure top number blood pressure over 95.    empty container (SHARPS-A-GATOR DISPOSAL SYSTEM) Misc Use as directed for sharps disposal    ergocalciferol-1,250 mcg (50,000 unit) (DRISDOL) 1,250 mcg, Oral, Weekly    evolocumab (REPATHA  SURECLICK) 140 mg/mL PnIj Inject the contents of 1 pen (140 mg total) under the skin every fourteen (14) days.    ferrous sulfate 325 mg, Oral, 2 times a day (standard)    fluticasone propionate (FLONASE) 50 mcg/actuation nasal spray 2 sprays, Each Nare, Daily (standard)    gabapentin (NEURONTIN) 100 mg, Oral, 3 times a day (standard)    glipiZIDE (GLUCOTROL) 5 MG tablet TAKE 1 TABLET EVERY MORNING FOR DIABETES    isosorbide mononitrate (IMDUR) 60 mg, Oral, Daily (standard)    metoPROLOL succinate (TOPROL-XL) 100 MG 24 hr tablet TAKE 2 TABLETS BY MOUTH EVERY DAY    miscellaneous medical supply Misc Glucometer  Dx: E11.9    miscellaneous medical supply Misc Glucometer test strips   Dx: E11.9    miscellaneous medical supply Misc Lancets  Dx  E11.9    nitroglycerin (NITROLINGUAL) 0.4 mg/dose spray 1 spray, Sublingual, Every 5 min PRN    omeprazole (PRILOSEC) 20 mg, Oral, 2 times a day (standard)    pramoxine 1 % Crea Apply up to three times daily. Limit to 7 days consistent use.    sucralfate (CARAFATE) 1 g, Oral, 3 times a day (standard), For stomach protection due to anemia / history of gastritis.    traZODone (DESYREL) 50 MG tablet TAKE 2 TABLETS BY MOUTH EVERY DAY AT NIGHT      Allergies:  is allergic to escitalopram oxalate, ace inhibitors, and statins-hmg-coa reductase inhibitors.    Social History:  He  reports that he quit smoking about 50 years ago. His smoking use included cigarettes. He started smoking about 65 years ago. He has a 15 pack-year smoking history. He has never used smokeless tobacco. He reports that he does not drink alcohol and does not use drugs.    Family History:  His family history includes Cancer in his brother, brother, brother, father, mother, sister, and sister; Depression in his mother; Heart attack in his brother, father, and mother; Lung cancer in his brother.       OBJECTIVE:      BP 124/74 (BP Site: L Arm, BP Position: Sitting, BP Cuff Size: Large)  - Pulse 79  - Temp 36.2 ??C (97.2 ??F) (Temporal)  - Resp 18  - Ht 182.9 cm (6' 0.01)  - Wt (!) 104.7 kg (230 lb 12.8 oz)  - SpO2 96%  - BMI 31.30 kg/m??      Wt Readings from Last 12 Encounters:   08/14/22 (!) 104.7 kg (230 lb 12.8 oz)   07/30/22 (!) 105.9 kg (233 lb 8 oz)   04/28/22 (!) 108.4 kg (239 lb)   04/14/22 (!) 109.9 kg (242 lb 3.2 oz)   02/24/22 (!) 110.5 kg (243 lb 8 oz)   02/13/22 (!) 109.3 kg (241 lb)   02/06/22 (!) 108 kg (238 lb)   02/05/22 (!) 108.9 kg (240 lb)   02/03/22 (!) 109.3 kg (241 lb)   01/30/22 (!) 110.2 kg (243 lb)   01/29/22 (!) 108.9 kg (240 lb)   01/27/22 (!) 108.4 kg (239 lb)     BP Readings from Last 5 Encounters:   08/14/22 124/74   07/30/22 124/72   04/28/22 134/76   04/14/22 120/72   02/24/22 127/78     Pulse Readings from Last 3 Encounters:   08/14/22 79   07/30/22 81   04/28/22 91       General:  Alert, no distress.   Eyes:  EOMI, sclerae anicteric   Neck: No carotid bruit. JVP  is not visible sitting upright   Resp:   CTAB bilaterally with normal WOB.   CV:  RRR with frequent ectopy, normal s1 and s2, no murmurs, normal radial pulses b/l, mild LE edema   GI:   Abdomen soft, non-tender   MSK: Normal muscle tone, no joint swelling/effusion   Skin: No lesions/rashes.   Neuro: No focal deficits.   Psych: Normal mood, normal affect     Lab Results   Component Value Date    HGB 12.9 04/28/2022    HGB 12.0 (L) 02/13/2022    HGB 10.9 (L) 01/16/2022    PLT 303 04/28/2022    PLT 362 02/13/2022    PLT 344 01/16/2022     Lab Results   Component Value Date    CREATININE 1.10 04/28/2022    CREATININE 1.40 (H) 02/13/2022    CREATININE 1.30 01/16/2022    K 4.8 04/28/2022    K 4.9 02/13/2022    K 4.9 01/16/2022      Lab Results   Component Value Date    BNP 408 (H) 03/18/2020     Lab Results   Component Value Date    PROBNP 541.0 (H) 01/16/2022    PROBNP 281.0 04/05/2021    PROBNP 3,890.0 (H) 02/26/2021     Lab Results   Component Value Date    CHOL 126 04/28/2022    CHOL 129 07/05/2021    LDL 33 (L) 04/28/2022    LDL 48 (L) 07/05/2021    HDL 44 04/28/2022    HDL 50 07/05/2021    TRIG 161 (H) 04/28/2022    TRIG 153 (H) 07/05/2021     Lab Results   Component Value Date    A1C 6.8 (A) 07/30/2022

## 2022-08-14 NOTE — Unmapped (Signed)
Let's increase the isosorbide/imdur back to 60mg  to see if that helps with chest pain episodes.    We can add yet another drug if we need to if you keep having more chest pain. Amlodipine or Ranolazine    See me in 2 months    [name], MD, PhD  [facility]  Cardiology

## 2022-08-17 DIAGNOSIS — E119 Type 2 diabetes mellitus without complications: Principal | ICD-10-CM

## 2022-08-17 MED ORDER — GLIPIZIDE 5 MG TABLET
ORAL_TABLET | 1 refills | 0 days
Start: 2022-08-17 — End: ?

## 2022-08-18 MED ORDER — GLIPIZIDE 5 MG TABLET
ORAL_TABLET | Freq: Every morning | ORAL | 1 refills | 90 days | Status: CP
Start: 2022-08-18 — End: 2023-08-18

## 2022-08-18 NOTE — Unmapped (Signed)
Patient is requesting the following refill  Requested Prescriptions     Pending Prescriptions Disp Refills    glipiZIDE (GLUCOTROL) 5 MG tablet [Pharmacy Med Name: GLIPIZIDE 5 MG TABLET] 90 tablet 1     Sig: TAKE 1 TABLET EVERY MORNING FOR DIABETES       Recent Visits  Date Type Provider Dept   07/30/22 Office Visit [name], MD [place] [facility]   04/28/22 Office Visit [name], MD [place] [facility] At [facility]   02/24/22 Office Visit [name], MD [place] [facility]   02/13/22 Office Visit [name], MD [place] [facility]   01/16/22 Office Visit [name], MD [place] [facility]   12/19/21 Office Visit [name], MD [place] [facility] At [facility]   11/13/21 Office Visit [name], MD [place] [facility] At [facility]   10/22/21 Office Visit [name], MD [place] [facility]   Showing recent visits within past 365 days with a meds authorizing provider and meeting all other requirements  Future Appointments  Date Type Provider Dept   10/29/22 Appointment [name], MD  Primary Care At [facility]   Showing future appointments within next 365 days with a meds authorizing provider and meeting all other requirements       Labs: A1c:   Hemoglobin A1C (%)   Date Value   07/30/2022 6.8 (A)

## 2022-08-27 DIAGNOSIS — E538 Deficiency of other specified B group vitamins: Principal | ICD-10-CM

## 2022-08-27 MED ORDER — CYANOCOBALAMIN (VIT B-12) 1,000 MCG/ML INJECTION SOLUTION
SUBCUTANEOUS | 1 refills | 0 days
Start: 2022-08-27 — End: ?

## 2022-08-27 NOTE — Unmapped (Signed)
Patient is requesting the following refill  Requested Prescriptions     Pending Prescriptions Disp Refills    cyanocobalamin, vitamin B-12, 1,000 mcg/mL injection [Pharmacy Med Name: CYANOCOBALAMIN 10,000 MCG/10ML] 10 mL 1     Sig: INJECT 1 ML (1,000 MCG TOTAL) UNDER THE SKIN EVERY THIRTY (30) DAYS.       Recent Visits  Date Type Provider Dept   07/30/22 Office Visit [name], MD [place] [facility]   04/28/22 Office Visit [name], MD [place] [facility]   02/24/22 Office Visit [name], MD [place] [facility] At [facility]   02/13/22 Office Visit [name], MD [place] [facility] At [facility]   01/16/22 Office Visit [name], MD [place] [facility] At [facility]   12/19/21 Office Visit [name], MD [place] [facility] At [facility]   11/13/21 Office Visit [name], MD [place] [facility] At [facility]   10/22/21 Office Visit [name], MD [place] [facility] At [facility]   Showing recent visits within past 365 days with a meds authorizing provider and meeting all other requirements  Future Appointments  Date Type Provider Dept   10/29/22 Appointment [name], MD [place] [facility] At [facility]   Showing future appointments within next 365 days with a meds authorizing provider and meeting all other requirements       Labs: Vitamin B12:    Vitamin B-12   Date Value   11/13/2021 568 pg/ml   07/06/2014 549 PG/ML

## 2022-08-27 NOTE — Unmapped (Signed)
Abstraction Result Flowsheet Data    This patient's last AWV date: [place] Last Medicare Wellness Visit Date: 12/09/2017  This patients last WCC/CPE date: : Not Found      Reason for Encounter  Reason for Encounter: Outreach  Primary Reason for Outreach: AWV  Text Message: No  MyChart Message: No  Outreach Call Outcome: Left message

## 2022-08-28 MED ORDER — CYANOCOBALAMIN (VIT B-12) 1,000 MCG/ML INJECTION SOLUTION
SUBCUTANEOUS | 1 refills | 300 days | Status: CP
Start: 2022-08-28 — End: ?

## 2022-08-29 NOTE — Unmapped (Signed)
[facility] Refill Coordination Note    Specialty Medication(s) to be Shipped:   General Specialty: Repatha    Other medication(s) to be shipped: No additional medications requested for fill at this time     Jesus Rubio, DOB: 15-Apr-1945  Phone: [phone] (home)       All above HIPAA information was verified with patient.     Was a Nurse, learning disability used for this call? No    Completed refill call assessment today to schedule patient's medication shipment from the [facility] [phone]).  All relevant notes have been reviewed.     Specialty medication(s) and dose(s) confirmed: Regimen is correct and unchanged.   Changes to medications: Jesus Rubio reports no changes at this time.  Changes to insurance: No  New side effects reported not previously addressed with a pharmacist or physician: None reported  Questions for the pharmacist: No    Confirmed patient received a Conservation officer, historic buildings and a Surveyor, mining with first shipment. The patient will receive a drug information handout for each medication shipped and additional FDA Medication Guides as required.       DISEASE/MEDICATION-SPECIFIC INFORMATION        For patients on injectable medications: Patient currently has 2 doses left.  Next injection is scheduled for 6/1.    SPECIALTY MEDICATION ADHERENCE     Medication Adherence    Patient reported X missed doses in the last month: 0  Specialty Medication: repatha sureclick 140mg /ml              Were doses missed due to medication being on hold? No    repatha sureclick 140mg /ml  : 21 days of medicine on hand     REFERRAL TO PHARMACIST     Referral to the pharmacist: Not needed      [facility]     Shipping address confirmed in Epic.       Delivery Scheduled: Yes, Expected medication delivery date: 6/7.     Medication will be delivered via Same Day Courier to the prescription address in Epic WAM.    Westley Gambles   [facility] Specialty Technician

## 2022-08-30 MED ORDER — ELIQUIS 5 MG TABLET
ORAL_TABLET | Freq: Two times a day (BID) | ORAL | 5 refills | 0 days
Start: 2022-08-30 — End: ?

## 2022-09-01 NOTE — Unmapped (Signed)
Patient is requesting the following refill  Requested Prescriptions     Pending Prescriptions Disp Refills    ELIQUIS 5 mg Tab [Pharmacy Med Name: ELIQUIS 5 MG TABLET] 60 tablet 5     Sig: TAKE 1 TABLET BY MOUTH TWO TIMES A DAY.       Recent Visits  Date Type Provider Dept   07/30/22 Office Visit [name], MD [place] [facility] At [facility]   04/28/22 Office Visit [name], MD [place] [facility] At [facility]   02/24/22 Office Visit [name], MD [place] [facility] At [facility]   02/13/22 Office Visit [name], MD [place] [facility] At [facility]   01/16/22 Office Visit [name], MD [place] [facility] At [facility]   12/19/21 Office Visit [name], MD [place] [facility]   11/13/21 Office Visit [name], MD [place] [facility]   10/22/21 Office Visit [name], MD [place] [facility] At [facility]   Showing recent visits within past 365 days with a meds authorizing provider and meeting all other requirements  Future Appointments  Date Type Provider Dept   10/29/22 Appointment [name], MD [place] [facility] At [facility]   Showing future appointments within next 365 days with a meds authorizing provider and meeting all other requirements

## 2022-09-02 MED ORDER — ELIQUIS 5 MG TABLET
ORAL_TABLET | Freq: Two times a day (BID) | ORAL | 6 refills | 30 days | Status: CP
Start: 2022-09-02 — End: ?

## 2022-09-08 ENCOUNTER — Ambulatory Visit: Admit: 2022-09-08 | Discharge: 2022-09-09 | Payer: MEDICARE | Attending: Family Medicine | Primary: Family Medicine

## 2022-09-08 DIAGNOSIS — R413 Other amnesia: Principal | ICD-10-CM

## 2022-09-08 MED ORDER — DONEPEZIL 5 MG TABLET
ORAL_TABLET | Freq: Every evening | ORAL | 2 refills | 30 days | Status: CP
Start: 2022-09-08 — End: 2023-09-08

## 2022-09-08 NOTE — Unmapped (Signed)
Health Maintenance Due   Topic Date Due    Zoster Vaccines (1 of 2) Never done    DTaP/Tdap/Td Vaccines (1 - Tdap) 04/08/2002    Medicare Annual Wellness Visit (AWV)  01/09/2019    COVID-19 Vaccine (1 - 2023-24 season) Never done    Retinal Eye Exam  12/13/2021           Goals & Recommendations:    Goals and Recommendations related to your health are outlined below.    If discussed and provided at your visit today: Please, keep and record your blood sugar, blood pressure, weight, fluid intake, dietary intake and exercise amount and frequency.     Blood Pressure Goals (unless otherwise instructed):  Check 2-3 times a week.  Typical average out of office measurement: <135 mmHg systolic and < 85 mmHg diastolic (less than 135/85).  More strict recommendations for out of office measurement: < 130 mmHg systolic and < 80 mmHg diastolic (less than 130/80).  Typical in office measurement <140 mmHg systolic and <90 mmHg diastolic (less than 140/90).  Most people will not feel well if blood pressure drops to less than 110/70, unless they typically have blood pressures in this range.   If you are noticing sudden increases or decreases in blood pressure, then please contact the office.    Diabetes: Goal Hemoglobin A1c 7.9 or lower.  Goal Blood Sugars Fasting: 60-140 (may check fasting and two hours after a meal later in the day).    For Monitoring your Mood your PHQ-9 Goal is less than 10.    Drink 48 ounces of water per day.  Avoid sweetened beverages.  You will generally feel better and experience improved health when you limit, and if possible avoid, processed foods.  Diets high in plant based foods, vegetables and fruits are associated with many health benefits.    Exercise for a minimum of 20 minutes daily. Minimum of four days a week.   If possible try to obtain 60 minutes of exercise daily for five or more days per week.  Regular movement throughout the day is best.   Walking is a good form of movement.   Squats, lunges, step-ups, push-ups, planks, and arm exercises with or without weights or resistance bands are good forms of exercise.    If your BMI (body mass index) is above 25 then your body is carrying more weight than is typically healthy.  If your BMI (body mass index) is above 25 and you are not achieving the healthy weight goals that you desire, then consider the following:  Keep calories below 2500 calories per day unless otherwise instructed. Remember that all calories are not equal.  Keep a food journal and record accurately.  Read labels and ingredient lists on the products that you consume.  Sometimes we need to adjust your level of physical activity, and or tailor an exercise program more specifically for your needs, just ask and we will come up with a plan.  If you have questions about any of these recommendations, please ask.    If you smoke, stop.  If you have heart failure or severe kidney disease: weigh yourself daily and maintain fluid intake to less than 2 Liters daily unless otherwise instructed.  If you have diabetes you need to see the eye doctor at least every one to two years and have a yearly foot exam.  Set aside time daily for relaxation.  Plan a pleasant activity that you can look forward to and enjoy.  Medication changes are outlined on your After Visit Summary.  Any referrals placed today will be outlined on your After Visit Summary.    If you have questions, please call the office or use MyChart.   If you need a refill on a medication, please contact your pharmacy.  Please, allow at least two business days for responses to refill requests.    Please, bring all medications and when possible original prescription containers to each and every office visit.    If you have been provided and / or requested to record your blood sugars, blood pressures, weight, fluid intake, dietary intake, or exercise, then please bring records to each and every appointment.    When you see other providers for health care, please request that they send information and records related to your visit to our office. This includes vaccinations, mammograms, radiology, procedures, diagnostic testing, lab testing and physical exams or wellness exams that you have outside of the [facility].      [facility] at [facility]  [address], [address]  [place], [place] [address]  Office: [phone]  Fax: [phone]               If a lab, test, imaging order or referral was placed today it will show on your AVS.    Labs:  Please obtain labs today prior to leaving, unless otherwise instructed.  If you have been asked to obtain labs at a later date, please schedule a Lab Visit appointment.    X-rays (plain films):    Plain film (traditional) x-rays do not require scheduling at [facility] or elsewhere in the [facility] system.  If you had an x-ray ordered today, our office recommends going to get the x-ray performed as soon as you can, unless otherwise instructed.  If you are obtaining X-rays (plain films) outside of the [facility] system, then please verify with your health care provider so that we can assist in coordinating any needed scheduling / appointments.    Referrals (referral for specialty consultation), Imaging (CT, MRI, Ultrasounds, Cardiac Testing, Other Imaging, Spirometry, Sleep Studies):  If there is a number on your AVS today for a location to call, then calling this number is recommended.  If there is not a number to call, then you will be contacted via your listed preferred phone number, text and or MyChart.  If you are signed up for [facility], then some information may be directed to your MyChart account as the first line of contact. For example, [facility] may contact you to schedule an appointment for a referral or test first via your MyChart account. If you have signed up previously for a [facility] MyChart account, though you do not currently access or know how to access, then please let a member of our office staff know. If you would like to set up a [facility] MyChart account please see the instructions near the end of your After Visit Summary. If you need assistance with [facility], you may call the [facility] at [phone].   Phone calls to schedule through [facility] may come from [phone] or [phone] area codes.  If you have not heard regarding scheduling of your referral in the next 7 days, then please contact our office at [phone] option 5.    Insurance Specific Considerations:  Please review information from your insurance provider regarding scheduling and coverage for specialty care, procedures and imaging.  If you know that your insurance specifically requires a referral or authorization prior to scheduling with a specialty care provider or for a test, please contact our office prior to scheduling at [phone] option 5.    Forms:  We are happy to assist with any forms you may need. We typically complete most forms within 7 business days.  For all forms that you need our office to complete, please review the forms in their entirety first, complete any areas that are required by you including any necessary signatures prior to leaving the forms with our office. Please note that many forms require signatures from patients related to consent for a health care provider or office to complete the form, where required this if often outlined on the forms that you would be submitting. Please also note the time frames for requested return of forms prior to providing to our office as well as any supporting documentation that you may need to provide. The most common reason for delayed completion of forms or return of forms to patients or submissions elsewhere are forms which are not signed by the patient where needed or forms requested for which we do not have supporting documentation or where additional information is needed prior to our completion of the requested forms. Please also note that there are instances beyond our control where forms may not be available within 7 business days.    Refills:  Please allow 3 business days for any refill requests to be processed. Please contact your pharmacy for any refills and to check the status on any refill request. In some instances your insurance may require a prior approval for certain medications. In those cases, refills are processed after we receive approval and this could possibly lengthen the turnaround time.    No Show and Late arrivals:  Please call the office when you need to cancel or reschedule an appointment, not doing so places the appointment as a no-show. If you have 3 or more no shows in a year, you may be asked to locate another provider outside of our office. If you arrive 15 minutes after your appointment time, you may be asked to reschedule.    Phone:  Our phone calls are answered Monday-Friday 8:00 am to 5:00 pm, by a staff member. Calls outside of these times and on weekends, will be answered by a registered nurse with [facility]. If needed, they will reach out to providers on call. Refills are not processed after normal business hours.     Coordination of Care:  When you see other providers for health care, please request that they send information and records related to your visit to our office. This includes vaccinations, mammograms, radiology, procedures, diagnostic testing, lab testing and physical exams or wellness exams that you have outside of the [facility] system. Our contact information is outlined below. Please periodically review your preferred contact information, preferred methods of contact and individuals listed on your contact list in our records. We recommend when possible having at least two phone numbers available for contact. If you have a MyChart account, please access and review your account periodically to update information and check for any new information that may have been sent to you.     Planning for your next visit:  Please bring any needed information to update your insurance, payment records, and preferred contact information.  Updated Medication List including supplements and over the counter medications.  Any information related to health care visits elsewhere since your  last visit, including: urgent cares, emergency departments, specialist offices, or other imaging or procedures.    We Acknowledge:  We acknowledge that while technology has made great progress in allowing [place] to communicate more and has improved some of the connectivity of health care and health care providers, these systems are not perfect. If any questions exist please always ask and if anything is unclear please let us know. In regards to tests, labs, imaging and recommendations for referrals or other procedures from our office, you should always hear something as far as an appointment, a result, a recommendation or a next step.    Billing:  For questions regarding billing please call [phone] option #4.  If you are signed up for [facility], then all billing statements will come to you directly by way of your [facility] MyChart account. Beginning in September 2023, if you are signed up for [facility], you will not receive a mailed paper billing statement from [facility]. If you have signed up previously for a [facility] MyChart account, though you do not currently access or know how to access, then please let a member of our office staff know. If you would like to set up a [facility] MyChart account please see the instructions near the end of your After Visit Summary. If you need assistance with your [facility], you may call the [facility] at [phone].     Inclement Weather:  During times of inclement weather please call the office to check on any changes in hours of operation.    Survey:  Thank you for the chance to participate in your health care.  Soon you should be receiving a survey about your recent visit to [facility].  Please take a moment to complete this survey.  We take your feedback very seriously and will use it to guide our continuous improvement efforts.     [facility] Primary Care at [facility]  [address], [address]  [place], [place] [address]  Office: [phone]  Fax: [phone]

## 2022-09-12 MED FILL — REPATHA SURECLICK 140 MG/ML SUBCUTANEOUS PEN INJECTOR: SUBCUTANEOUS | 28 days supply | Qty: 2 | Fill #3

## 2022-09-12 NOTE — Unmapped (Signed)
Assessment/Plan     Diagnoses and all orders for this visit:    Memory changes  Progressive memory changes.  Unclear specific etiology though likely multifactorial in light of cerebrovascular disease.  Recommend MRI of the brain.  Advised aggressive efforts to maintain cognition to include sleep hygiene, appropriate diet, exercise and engaging in mentally challenging activities.  Recommend starting Aricept.  -     MRI brain with and without contrast; Future  -     donepezil (ARICEPT) 5 MG tablet; Take 1 tablet (5 mg total) by mouth nightly.    Type 2 diabetes mellitus with diabetic polyneuropathy, without long-term current use of insulin (CMS-HCC)  Stable overall. Well controlled overall. Continue current regimen.    Palpitations  Clinically stable doing well at present.  Continue current plan of care.    Coronary artery disease involving native coronary artery of native heart, unspecified whether angina present  Clinically stable and doing well at present.  Continue current plan of care.    Disposition: Return in about 4 weeks (around 10/06/2022) for Recheck, Memory.  10/07/2022      Diagnoses and plan along with any newly prescribed medication(s), recommendations, orders and therapies were discussed in detail with patient today.   Standard education and anticipatory guidance regarding goals, objectives and potential sequelae were reviewed. Patient voiced appropriate understanding of assessment and plan of care.    Pertinent handouts were given today and reviewed with the patient as indicated.  The Care Plan and Self-Management goals have been included on the AVS and the AVS has been printed.  Where helpful for monitoring and management I have encouraged the patient to keep regular logs for me to review at their next visit. Any outside resources or referrals needed at this time are noted above. No additional referrals necessary at this time. Patient voiced understanding and all questions have been answered to satisfaction.     To complete this documentation in a timely manner, portions of this encounter may have been completed utilizing Dragon voice recognition software. Please take this into consideration in noting nuances of documentation, grammar, punctuation, and wording. If and when a question exists, or for clarification, please contact the author.    Barron Alvine, MD  [facility] at [facility]  [address], [address]  [place], [name] [address]  Phone [phone]    Subjective:     [id number], Jesus Beam, MD    Jesus Rubio, is a 77 y.o. male    Chief Complaint   Patient presents with    Memory Loss     Wife states that patient is having memory loss. Not sure if it is heat related. Can remember things.        HPI:    Patient presents: With wife.    Nursing notes and history reviewed with patient.    Memory changes.  Has been progressive since last visit.  No acute strokelike symptoms.  Upon review with patient and wife patient was not able to recall what he had for breakfast this morning or what he did yesterday.    Diabetes Mellitus, Type II with polyneuropathy.  Most recently stable.  Goal A1c less than 8.  No hypo or hyperglycemic symptoms.    Palpitations.  Noted previously.  Denies recent concerning related symptoms.  No dizziness.    Coronary artery disease. Compliant with therapy. No chest pain, shortness of breath, orthopnea, PND.  No increase nitroglycerin use.  No bleeding complications from aspirin therapy  noted.    No other complaints.  No additional recent health care visits elsewhere. No urgent care or ED visits.  No additional palliative, provocative, modifying, quantifying, qualifying, or other related factors.  Unless otherwise outlined above patient is pleased with current medication regimen, admits compliance and denies side effects of medication.    The following portions of the patient's history were reviewed and updated as appropriate: allergies, current medications, past family history, past medical history, past social history, past surgical history and problem list.    ROS: Pertinent positives and negatives are as outlined in the HPI above. The remaining balance of the 12 point review of systems is otherwise benign.      PCMH Components:      Goals         <130/80       Lack of exercise or physical activity    Increase exercise or physical activity        Get heart rate stabilized (pt-stated)         Things to think about to help me reach my goal:     What are you going to do? Take medications as prescribed, Keep positive thinking, Pace activities, Stay hydrated   How and how much? Daily   How frequent? Daily   Barriers to success?    Solutions to barriers?      06/04/17: Patient continues to work on this goal   09/29/17 Patient continues to work on this goal   12-09-2017: Reports he continues to work on this and is staying positive.        Take actions to prevent falling       12-03-2016: New goal: Will change positions slowly and pay attention to pathways. Uses cane or walker at times.   09/29/17 Patient continues to work on this goal   12-09-2017: Reports 1 fall 6-8 months ago when he missed a step and fell. Denies injury. Uses cane occasionally.              Family characteristics, patient's social characteristics, patient's cultural characteristics, patient's communication needs, patient's health literacy and behaviors affecting patient's health are outlined under the patient's History Section, Longitudinal Care Plan or Problem List in Epic or as pertinent in History of Present Illness.    Barriers to goals identified and addressed.   See Assessment and Plan for additional pertinent details.    Objective:     Physical Exam   BP 120/76  - Pulse 68  - Temp 37.4 ??C (99.4 ??F) (Oral)  - Resp 16  - Ht 182.9 cm (6' 0.01)  - Wt (!) 101.6 kg (224 lb)  - SpO2 95%  - BMI 30.37 kg/m??   Constitutional: Oriented to person, place, and time. Appears well-developed and well-nourished. No distress. Head: Normocephalic and atraumatic.   Mouth/Throat: Oropharynx is without mass or concerning lesions. Moist mucous membranes.    Eyes: Conjunctivae are normal. No scleral icterus.   Neck: Neck supple. Normal range of motion. No JVD present. No thyromegaly present. No concerning neck mass.   Cardiovascular: Normal rate. Regular rhythm. Normal heart sounds. No murmur heard.  Pulmonary/Chest: Effort normal. No respiratory distress. Breath sounds normal. No wheezes. No rhonchi. No rales. No focal breath sound changes. No chest tenderness.  Abdominal: Soft. Non-distended. No abdominal mass. No tenderness. No rebound. No guarding.   Musculoskeletal: No active synovitis. No concerning joint erythema, warmth or swelling.   Range of motion is without evidence of significant impairment.  Strength is without evidence of significant impairment.   No edema of the extremities.   Lymphadenopathy: There is no lymphadenopathy about the head or neck.  Neurological: Alert and oriented to person, place, and time. No cranial nerve deficit. Normal muscle tone. Normal coordination.  Skin: Skin is warm and dry. No pallor. No rash noted.   Psychiatric: Normal mood, affect and behavior. Judgment and thought content normal.

## 2022-10-01 NOTE — Unmapped (Signed)
Abstraction Result Flowsheet Data    This patient's last AWV date: [place] Last Medicare Wellness Visit Date: 12/09/2017  This patients last WCC/CPE date: : Not Found      Reason for Encounter  Reason for Encounter: Outreach  Primary Reason for Outreach: AWV  Text Message: No  MyChart Message: No  Outreach Call Outcome: Left message

## 2022-10-03 NOTE — Unmapped (Signed)
[facility] Refill Coordination Note    Specialty Lite Medication(s) to be Shipped:   General Specialty: Repatha    Other medication(s) to be shipped: No additional medications requested for fill at this time     Jesus Rubio, DOB: 14-Dec-1945  Phone: There are no phone numbers on file.      All above HIPAA information was verified with patient's family member, spouse.     Was a Nurse, learning disability used for this call? No    Changes to medications: [place] reports no changes at this time.  Changes to insurance: No      REFERRAL TO PHARMACIST     Referral to the pharmacist: Not needed      [facility]     Shipping address confirmed in Epic.     Delivery Scheduled: Yes, Expected medication delivery date: 07/05.     Medication will be delivered via Same Day Courier to the prescription address in Epic WAM.    Antonietta Barcelona   [facility] Specialty Technician

## 2022-10-07 ENCOUNTER — Ambulatory Visit: Admit: 2022-10-07 | Discharge: 2022-10-07 | Payer: MEDICARE | Attending: Family Medicine | Primary: Family Medicine

## 2022-10-07 ENCOUNTER — Ambulatory Visit: Admit: 2022-10-07 | Discharge: 2022-10-07 | Payer: MEDICARE

## 2022-10-07 DIAGNOSIS — Z862 Personal history of diseases of the blood and blood-forming organs and certain disorders involving the immune mechanism: Principal | ICD-10-CM

## 2022-10-07 DIAGNOSIS — G629 Polyneuropathy, unspecified: Principal | ICD-10-CM

## 2022-10-07 DIAGNOSIS — E1122 Type 2 diabetes mellitus with diabetic chronic kidney disease: Principal | ICD-10-CM

## 2022-10-07 DIAGNOSIS — Z0000 Encounter for general adult medical examination without abnormal findings: Principal | ICD-10-CM

## 2022-10-07 DIAGNOSIS — N1832 Type 2 diabetes mellitus with stage 3b chronic kidney disease, without long-term current use of insulin (CMS-HCC): Principal | ICD-10-CM

## 2022-10-07 DIAGNOSIS — R413 Other amnesia: Principal | ICD-10-CM

## 2022-10-07 DIAGNOSIS — Z8673 Personal history of transient ischemic attack (TIA), and cerebral infarction without residual deficits: Principal | ICD-10-CM

## 2022-10-07 DIAGNOSIS — N1831 Stage 3a chronic kidney disease (CMS-HCC): Principal | ICD-10-CM

## 2022-10-07 DIAGNOSIS — J849 Interstitial pulmonary disease, unspecified: Principal | ICD-10-CM

## 2022-10-07 DIAGNOSIS — L304 Erythema intertrigo: Principal | ICD-10-CM

## 2022-10-07 DIAGNOSIS — I2510 Atherosclerotic heart disease of native coronary artery without angina pectoris: Principal | ICD-10-CM

## 2022-10-07 MED ORDER — CLOTRIMAZOLE 1 % TOPICAL CREAM
Freq: Two times a day (BID) | TOPICAL | 0 refills | 0 days | Status: CP
Start: 2022-10-07 — End: 2023-10-07

## 2022-10-07 NOTE — Unmapped (Signed)
ADVANCE CARE PLANNING NOTE    Discussion Date:  October 07, 2022    Patient has decisional capacity:  Yes    Patient has selected a Health Care Decision-Maker if loses capacity: Yes    Health Care Decision Maker as of 10/07/2022    HCDM (patient stated preference): Jesus Rubio, Jesus Rubio Spouse - [phone]    HCDM, back-up (If primary HCDM is unavailable): Jesus Rubio, Jesus Rubio - [phone]    Discussion Participants:  Patient and RNCM     Communication of Medical Status/Prognosis:   Patient states my overall health is good and has no other concerns at this time.      Communication of Treatment Goals/Options:   Patient completed. Denies changes     Treatment Decisions:   10/07/2022    [name], MD was present and immediately available in office suite.    The patient reports they have a Healthcare power of attorney Living Will but have not brought a copy to the office.  Have discussed the importance of having this included in the medical record. .                I spent 15 minutes providing voluntary advance care planning services for this patient.

## 2022-10-07 NOTE — Unmapped (Signed)
This auto-generated note displays some results identified during the AWV Assessments. For full results, please see the Flowsheet Links under the Additional Documentation section of this encounter in Chart Review.        Inform/Regulatory Low priority, no response needed unless change in care.     I am reaching out to Jesus Rubio as part of the embedded care management team regarding his Annual Medicare Wellness visit.  Patient is at risk for Dental issues, Falls Risk, and Tired/fatigued.    Patient advised to Review AVS for education related to risk.Marland Kitchen      Next appt with PCP: Today     See chart for med list if needed    Sharing communication as part of regulatory requirements.        Social Determinants of Health:  Social Determinants of Health Screened today.  Interventions Provided: I provided an intervention for the Physical Activity, Stress, and Tobacco Use SDOH domain. The intervention was Counseling, Education, and Other: Exercise Instruction on AVS and Exercise goal set with patient. Patient former smoker. No intervention needed.        PCP notified of above risks and PCP made aware of HM items due via patient chart.        The following list of current providers and suppliers reviewed and updated this visit.  Patient Care Team:  [name], MD as PCP - General (Family Medicine)  [name], [place] as PCP - Shonna Chock, Encarnacion Chu, MD as Attending Provider (Psychiatry)  [name], MD as Consulting Physician (Cardiology)  Excell Seltzer, MD (Nephrology)    Medications and supplements were reviewed and updated this visit. See medication list in encounter summary.     A personalized prevent plan was updated and reviewed with the patient. A copy has been provided to the patient in Patient Instructions.    Recent Hospitalizations reviewed:  No recent hospitalizations     General Health:  Patient answered (!) No (Patient has dentures) to having visited the dentist in the past year.         Patient's BMI is 29.56   Pain identified during today's visit        10/07/22 1118   PainSc: 0-No pain              Safety:   The patient answered (!) Yes to falling in the last year, and (!) Yes to feeling unsteady while standing or walking.  Falls Assessment Review    Fall Risk Assessment was positive.  Falls risk interventions taken today were:  Medications were reviewed  Falls Prevention on AVS      Patient answered No to leaking stool or losing control of their bowels. Patient answered (!) Yes to leaking urine.         Psychosocial Assessment:   Patient expressed that they or a family member has concerns about their memory  Inform PCP    Patient answered one or more psychosocial assessments abnormally  Do you feel overwhelmed as a caregiver? Not Applicable   Do you express feelings of anger and frustration in ways that are hurtful to yourself or others? Never or Almost Never    Do you feel generally tired or fatigue? (!) Always    Fatigue Instruction on AVS    PHQ 2:  Patient had a PHQ 2 score of 1    PHQ 9: N/A    No intervention necessary Banker)    Social Determinants  of Health     Financial Resource Strain: Low Risk  (10/07/2022)    Overall Financial Resource Strain (CARDIA)     Difficulty of Paying Living Expenses: Not hard at all   Internet Connectivity: No Internet connectivity concern identified (10/07/2022)    Internet Connectivity     Do you have access to internet services: Yes     How do you connect to the internet: Personal Device at home     Is your internet connection strong enough for you to watch video on your device without major problems?: Yes     Do you have enough data to get through the month?: Yes     Does at least one of the devices have a camera that you can use for video chat?: Yes   Food Insecurity: No Food Insecurity (10/07/2022)    Hunger Vital Sign     Worried About Running Out of Food in the Last Year: Never true     Ran Out of Food in the Last Year: Never true   Tobacco Use: Medium Risk (10/07/2022)    Patient History     Smoking Tobacco Use: Former     Smokeless Tobacco Use: Never     Passive Exposure: Past   Housing/Utilities: Low Risk  (10/07/2022)    Housing/Utilities     Within the past 12 months, have you ever stayed: outside, in a car, in a tent, in an overnight shelter, or temporarily in someone else's home (i.e. couch-surfing)?: No     Are you worried about losing your housing?: No     Within the past 12 months, have you been unable to get utilities (heat, electricity) when it was really needed?: No   Alcohol Use: Not At Risk (10/07/2022)    Alcohol Use     How often do you have a drink containing alcohol?: Never     How many drinks containing alcohol do you have on a typical day when you are drinking?: 1 - 2     How often do you have 5 or more drinks on one occasion?: Never   Transportation Needs: No Transportation Needs (10/07/2022)    PRAPARE - Transportation     Lack of Transportation (Medical): No     Lack of Transportation (Non-Medical): No   Substance Use: Low Risk  (10/07/2022)    Substance Use     Taken prescription drugs for non-medical reasons: Never     Taken illegal drugs: Never     Patient indicated they have taken drugs in the past year for non-medical reasons: Yes, [positive answer(s)]: Not on file   Health Literacy: Low Risk  (10/07/2022)    Health Literacy     : Never   Physical Activity: Insufficiently Active (10/07/2022)    Exercise Vital Sign     Days of Exercise per Week: 2 days     Minutes of Exercise per Session: 30 min   Interpersonal Safety: Not at risk (10/07/2022)    Interpersonal Safety     Unsafe Where You Currently Live: No     Physically Hurt by Anyone: No     Abused by Anyone: No   Stress: Stress Concern Present (10/07/2022)    [employer] of Occupational Health - Occupational Stress Questionnaire     Feeling of Stress : Rather much   Intimate Partner Violence: Not At Risk (10/07/2022)    Humiliation, Afraid, Rape, and Kick questionnaire     Fear of Current or Ex-Partner: No  Emotionally Abused: No     Physically Abused: No     Sexually Abused: No   Depression: Not at risk (10/07/2022)    PHQ-2     PHQ-2 Score: 1   Social Connections: Socially Integrated (10/07/2022)    Social Connection and Isolation Panel [NHANES]     Frequency of Communication with Friends and Family: More than three times a week     Frequency of Social Gatherings with Friends and Family: More than three times a week     Attends Religious Services: More than 4 times per year     Active Member of [employer] or Organizations: Yes     Attends Engineer, structural: More than 4 times per year     Marital Status: Married

## 2022-10-07 NOTE — Unmapped (Signed)
Health Maintenance Due   Topic Date Due    Zoster Vaccines (1 of 2) Never done    DTaP/Tdap/Td Vaccines (1 - Tdap) 04/08/2002    Medicare Annual Wellness Visit (AWV)  01/09/2019    COVID-19 Vaccine (1 - 2023-24 season) Never done    Retinal Eye Exam  12/13/2021           Goals & Recommendations:    Goals and Recommendations related to your health are outlined below.    If discussed and provided at your visit today: Please, keep and record your blood sugar, blood pressure, weight, fluid intake, dietary intake and exercise amount and frequency.     Blood Pressure Goals (unless otherwise instructed):  Check 2-3 times a week.  Typical average out of office measurement: <135 mmHg systolic and < 85 mmHg diastolic (less than 135/85).  More strict recommendations for out of office measurement: < 130 mmHg systolic and < 80 mmHg diastolic (less than 130/80).  Typical in office measurement <140 mmHg systolic and <90 mmHg diastolic (less than 140/90).  Most people will not feel well if blood pressure drops to less than 110/70, unless they typically have blood pressures in this range.   If you are noticing sudden increases or decreases in blood pressure, then please contact the office.    Diabetes: Goal Hemoglobin A1c 7.9 or lower.  Goal Blood Sugars Fasting: 60-140 (may check fasting and two hours after a meal later in the day).    For Monitoring your Mood your PHQ-9 Goal is less than 10.    Drink 48 ounces of water per day.  Avoid sweetened beverages.  You will generally feel better and experience improved health when you limit, and if possible avoid, processed foods.  Diets high in plant based foods, vegetables and fruits are associated with many health benefits.    Exercise for a minimum of 20 minutes daily. Minimum of four days a week.   If possible try to obtain 60 minutes of exercise daily for five or more days per week.  Regular movement throughout the day is best.   Walking is a good form of movement.   Squats, lunges, step-ups, push-ups, planks, and arm exercises with or without weights or resistance bands are good forms of exercise.    If your BMI (body mass index) is above 25 then your body is carrying more weight than is typically healthy.  If your BMI (body mass index) is above 25 and you are not achieving the healthy weight goals that you desire, then consider the following:  Keep calories below 2500 calories per day unless otherwise instructed. Remember that all calories are not equal.  Keep a food journal and record accurately.  Read labels and ingredient lists on the products that you consume.  Sometimes we need to adjust your level of physical activity, and or tailor an exercise program more specifically for your needs, just ask and we will come up with a plan.  If you have questions about any of these recommendations, please ask.    If you smoke, stop.  If you have heart failure or severe kidney disease: weigh yourself daily and maintain fluid intake to less than 2 Liters daily unless otherwise instructed.  If you have diabetes you need to see the eye doctor at least every one to two years and have a yearly foot exam.  Set aside time daily for relaxation.  Plan a pleasant activity that you can look forward to and enjoy.  Medication changes are outlined on your After Visit Summary.  Any referrals placed today will be outlined on your After Visit Summary.    If you have questions, please call the office or use MyChart.   If you need a refill on a medication, please contact your pharmacy.  Please, allow at least two business days for responses to refill requests.    Please, bring all medications and when possible original prescription containers to each and every office visit.    If you have been provided and / or requested to record your blood sugars, blood pressures, weight, fluid intake, dietary intake, or exercise, then please bring records to each and every appointment.    When you see other providers for health care, please request that they send information and records related to your visit to our office. This includes vaccinations, mammograms, radiology, procedures, diagnostic testing, lab testing and physical exams or wellness exams that you have outside of the [facility].      [facility] at [facility]  [address]  [place], [place] [address]  Office: [phone]  Fax: [phone]               If a lab, test, imaging order or referral was placed today it will show on your AVS.    Labs:  Please obtain labs today prior to leaving, unless otherwise instructed.  If you have been asked to obtain labs at a later date, please schedule a Lab Visit appointment.    X-rays (plain films):    Plain film (traditional) x-rays do not require scheduling at [facility] or elsewhere in the [facility] system.  If you had an x-ray ordered today, our office recommends going to get the x-ray performed as soon as you can, unless otherwise instructed.  If you are obtaining X-rays (plain films) outside of the [facility] system, then please verify with your health care provider so that we can assist in coordinating any needed scheduling / appointments.    Referrals (referral for specialty consultation), Imaging (CT, MRI, Ultrasounds, Cardiac Testing, Other Imaging, Spirometry, Sleep Studies):  If there is a number on your AVS today for a location to call, then calling this number is recommended.  If there is not a number to call, then you will be contacted via your listed preferred phone number, text and or MyChart.  If you are signed up for [facility], then some information may be directed to your MyChart account as the first line of contact. For example, Vina may contact you to schedule an appointment for a referral or test first via your MyChart account. If you have signed up previously for a [facility] MyChart account, though you do not currently access or know how to access, then please let a member of our office staff know. If you would like to set up a [facility] MyChart account please see the instructions near the end of your After Visit Summary. If you need assistance with [facility], you may call the [facility] at [phone].   Phone calls to schedule through [facility] may come from [phone] or [phone] area codes.  If you have not heard regarding scheduling of your referral in the next 7 days, then please contact our office at [phone] option 5.    Insurance Specific Considerations:  Please review information from your insurance provider regarding scheduling and coverage for specialty care, procedures and imaging.  If you know that your insurance specifically requires a referral or authorization prior to scheduling with a specialty care provider or for a test, please contact our office prior to scheduling at [phone] option 5.    Forms:  We are happy to assist with any forms you may need. We typically complete most forms within 7 business days.  For all forms that you need our office to complete, please review the forms in their entirety first, complete any areas that are required by you including any necessary signatures prior to leaving the forms with our office. Please note that many forms require signatures from patients related to consent for a health care provider or office to complete the form, where required this if often outlined on the forms that you would be submitting. Please also note the time frames for requested return of forms prior to providing to our office as well as any supporting documentation that you may need to provide. The most common reason for delayed completion of forms or return of forms to patients or submissions elsewhere are forms which are not signed by the patient where needed or forms requested for which we do not have supporting documentation or where additional information is needed prior to our completion of the requested forms. Please also note that there are instances beyond our control where forms may not be available within 7 business days.    Refills:  Please allow 3 business days for any refill requests to be processed. Please contact your pharmacy for any refills and to check the status on any refill request. In some instances your insurance may require a prior approval for certain medications. In those cases, refills are processed after we receive approval and this could possibly lengthen the turnaround time.    No Show and Late arrivals:  Please call the office when you need to cancel or reschedule an appointment, not doing so places the appointment as a no-show. If you have 3 or more no shows in a year, you may be asked to locate another provider outside of our office. If you arrive 15 minutes after your appointment time, you may be asked to reschedule.    Phone:  Our phone calls are answered Monday-Friday 8:00 am to 5:00 pm, by a staff member. Calls outside of these times and on weekends, will be answered by a registered nurse with [facility]. If needed, they will reach out to providers on call. Refills are not processed after normal business hours.     Coordination of Care:  When you see other providers for health care, please request that they send information and records related to your visit to our office. This includes vaccinations, mammograms, radiology, procedures, diagnostic testing, lab testing and physical exams or wellness exams that you have outside of the [facility] system. Our contact information is outlined below. Please periodically review your preferred contact information, preferred methods of contact and individuals listed on your contact list in our records. We recommend when possible having at least two phone numbers available for contact. If you have a MyChart account, please access and review your account periodically to update information and check for any new information that may have been sent to you.     Planning for your next visit:  Please bring any needed information to update your insurance, payment records, and preferred contact information.  Updated Medication List including supplements and over the counter medications.  Any information related to health care visits elsewhere since your  last visit, including: urgent cares, emergency departments, specialist offices, or other imaging or procedures.    We Acknowledge:  We acknowledge that while technology has made great progress in allowing [place] to communicate more and has improved some of the connectivity of health care and health care providers, these systems are not perfect. If any questions exist please always ask and if anything is unclear please let us know. In regards to tests, labs, imaging and recommendations for referrals or other procedures from our office, you should always hear something as far as an appointment, a result, a recommendation or a next step.    Billing:  For questions regarding billing please call [phone] option #4.  If you are signed up for [facility], then all billing statements will come to you directly by way of your [facility] MyChart account. Beginning in September 2023, if you are signed up for [facility], you will not receive a mailed paper billing statement from [facility]. If you have signed up previously for a [facility] MyChart account, though you do not currently access or know how to access, then please let a member of our office staff know. If you would like to set up a [facility] MyChart account please see the instructions near the end of your After Visit Summary. If you need assistance with your [facility], you may call the [facility] at [phone].     Inclement Weather:  During times of inclement weather please call the office to check on any changes in hours of operation.    Survey:  Thank you for the chance to participate in your health care.  Soon you should be receiving a survey about your recent visit to [facility] [place].  Please take a moment to complete this survey.  We take your feedback very seriously and will use it to guide our continuous improvement efforts.     [facility] at [facility]  [address]  [place], [place] [address]  Office: [phone]  Fax: [phone]           Learning About Mindfulness for Stress  What are mindfulness and stress?    Stress is what you feel when you have to handle more than you are used to. A lot of things can cause stress. You may feel stress when you go on a job interview, take a test, or run a race. This kind of short-term stress is normal and even useful. It can help you if you need to work hard or react quickly.  Stress also can last a long time. Long-term stress is caused by stressful situations or events. Examples of long-term stress include long-term health problems, ongoing problems at work, and conflicts in your family. Long-term stress can harm your health.  Mindfulness is a focus only on things happening in the present moment. It's a process of purposefully paying attention to and being aware of your surroundings, your emotions, your thoughts, and how your body feels. You are aware of these things, but you aren't judging these experiences as good or bad. Mindfulness can help you learn to calm your mind and body to help you cope with illness, pain, and stress.  How does mindfulness help to relieve stress?  Mindfulness can help quiet your mind and relax your body. Studies show that it can help some people sleep better, feel less anxious, and bring their blood pressure down. And it's been shown to help  some people live and cope better with certain health problems like heart disease, depression, chronic pain, and cancer.  How do you practice mindfulness?  To be mindful is to pay attention, to be present, and to be accepting.  When you're mindful, you do just one thing and you pay close attention to that one thing. For example, you may sit quietly and notice your emotions or how your food tastes and smells.  When you're present, you focus on the things that are happening right now. You let go of your thoughts about the past and the future. When you dwell on the past or the future, you miss moments that can heal and strengthen you. You may miss moments like hearing a child laugh or seeing a friendly face when you think you're all alone.  When you're accepting, you don't judge the present moment. Instead you accept your thoughts and feelings as they come.  You can practice anytime, anywhere, and in any way you choose. You can practice in many ways. Here are a few ideas:  While doing your chores, like washing the dishes, let your mind focus on what's in your hand. What does the dish feel like? Is the water warm or cold?  Go outside and take a few deep breaths. What is the air like? Is it warm or cold?  When you can, take some time at the start of your day to sit alone and think.  Take a slow walk by yourself. Count your steps while you breathe in and out.  Try yoga breathing exercises, stretches, and poses to strengthen and relax your muscles.  At work, if you can, try to stop for a few moments each hour. Note how your body feels. Let yourself regroup and let your mind settle before you return to what you were doing.  If you struggle with anxiety or worry thoughts, imagine your mind as a blue sky and your worry thoughts as clouds. Now imagine those worry thoughts floating across your mind's sky. Just let them pass by as you watch.  Follow-up care is a key part of your treatment and safety. Be sure to make and go to all appointments, and call your doctor if you are having problems. It's also a good idea to know your test results and keep a list of the medicines you take.  Where can you learn more?  Go to [facility] at [email or web address]  Select Preferences in the upper right hand corner, then select Health Library under Resources. Enter [phone] in the search box to learn more about Learning About Mindfulness for Stress.  Current as of: January 15, 2016  Content Version: 11.6  ?? 2006-2018 [employer]. Care instructions adapted under license by [facility]. If you have questions about a medical condition or this instruction, always ask your healthcare professional. [employer] Inc[employer] disclaims any warranty or liability for your use of this information.         Learning About Guided Imagery for Stress  What are guided imagery and stress?    Stress is what you feel when you have to handle more than you are used to. A lot of things can cause stress. You may feel stress when you go on a job interview, take a test, or run a race. This kind of short-term stress is normal and even useful. It can help you if you need to work hard or react quickly.  Stress also can last a long time. Long-term stress is caused by  stressful situations or events. Examples of long-term stress include long-term health problems, ongoing problems at work, and conflicts in your family. Long-term stress can harm your health.  Guided imagery is a technique of directed thoughts and suggestions that guide your mind toward a relaxed, focused state. This technique helps you use your mind to take you to a calm, peaceful place. You can use it to relax, which can lower blood pressure and reduce other problems related to stress.  How does guided imagery help to relieve stress?  Because of the way the mind and body are connected, guided imagery can make you feel like you are experiencing something just by imagining it. You can achieve a relaxed state when you imagine all the details of a safe, comfortable place, such as a beach or a garden. This relaxed state may help you feel more in control of your emotions and thought processes. Feeling in control may improve your attitude, health, and sense of well-being.  How do you do guided imagery?  You can use a smartphone app or a video to lead you through the process. You use all of your senses in guided imagery. For example, if you want a tropical setting, you can imagine the warm breeze on your skin, the bright blue of the water, the sound of the surf, the sweet scent of tropical flowers, and the taste of coconut. Imagining those things can make you actually feel like you're there.  But you don't have to imagine the tropics to feel peace. Guided imagery can take you to your own restful place. To give guided imagery a try, follow these steps:  Lean back comfortably in your chair. If you can, close your eyes. Put your arms on the armrests, or fold your hands in your lap.  Take a deep breath through your nose. Breathe in slowly, and then let the air out completely through your mouth.  Do it again slowly. Keep breathing like this. Gather up any tension in your body, and send it out with every breath.  Let a feeling of warmth spread from your lungs to your neck and head, down your arms to your fingertips, through your body and into your legs, all the way down to your toes. Stay this way for a moment.  Now imagine a pleasant day. You're at a park or at a place you've visited in the past where you felt at peace.  In your mind's eye, look at what lies in front of you. Maybe you see the sun, filtered through trees. Maybe clouds are drifting by.  Look to one side, and then the other. Notice the feel of the air around you. Notice how it feels on your face and on your arms.  Stay here for a while. Let it become real for you.  Follow-up care is a key part of your treatment and safety. Be sure to make and go to all appointments, and call your doctor if you are having problems. It's also a good idea to know your test results and keep a list of the medicines you take.  Where can you learn more?  Go to [facility] at [email or web address]  Select Preferences in the upper right hand corner, then select Health Library under Resources. Enter N291 in the search box to learn more about Learning About Guided Imagery for Stress.  Current as of: January 15, 2016  Content Version: 11.6  ?? 2006-2018 [employer]. Care instructions adapted under license by [facility]. If you  have questions about a medical condition or this instruction, always ask your healthcare professional. [employer] Inc[employer] disclaims any warranty or liability for your use of this information.        Learning About the Mediterranean Diet  What is the Mediterranean diet?    The Mediterranean diet is a style of eating rather than a diet plan. It features foods eaten in [place], [place], southern [place] and [place], and other countries along the [employer]. It emphasizes eating foods like fish, fruits, vegetables, beans, high-fiber breads and whole grains, nuts, and olive oil. This style of eating includes limited red meat, cheese, and sweets.  Why choose the Mediterranean diet?  A Mediterranean-style diet may improve heart health. It contains more fat than other heart-healthy diets. But the fats are mainly from nuts, unsaturated oils (such as fish oils and olive oil), and certain nut or seed oils (such as canola, soybean, or flaxseed oil). These fats may help protect the heart and blood vessels.  How can you get started on the Mediterranean diet?  Here are some things you can do to switch to a more Mediterranean way of eating.  What to eat  Eat a variety of fruits and vegetables each day, such as grapes, blueberries, tomatoes, broccoli, peppers, figs, olives, spinach, eggplant, beans, lentils, and chickpeas.  Eat a variety of whole-grain foods each day, such as oats, brown rice, and whole wheat bread, pasta, and couscous.  Eat fish at least 2 times a week. Try tuna, salmon, mackerel, lake trout, herring, or sardines.  Eat moderate amounts of low-fat dairy products, such as milk, cheese, or yogurt.  Eat moderate amounts of poultry and eggs.  Choose healthy (unsaturated) fats, such as nuts, olive oil, and certain nut or seed oils like canola, soybean, and flaxseed.  Limit unhealthy (saturated) fats, such as butter, palm oil, and coconut oil. And limit fats found in animal products, such as meat and dairy products made with whole milk. Try to eat red meat only a few times a month in very small amounts.  Limit sweets and desserts to only a few times a week. This includes sugar-sweetened drinks like soda.  The Mediterranean diet may also include red wine with your meal--1 glass each day for women and up to 2 glasses a day for men.  Tips for eating at home  Use herbs, spices, garlic, lemon zest, and citrus juice instead of salt to add flavor to foods.  Add avocado slices to your sandwich instead of bacon.  Have fish for lunch or dinner instead of red meat. Brush the fish with olive oil, and broil or grill it.  Sprinkle your salad with seeds or nuts instead of cheese.  Cook with olive or canola oil instead of butter or oils that are high in saturated fat.  Switch from 2% milk or whole milk to 1% or fat-free milk.  Dip raw vegetables in a vinaigrette dressing or hummus instead of dips made from mayonnaise or sour cream.  Have a piece of fruit for dessert instead of a piece of cake. Try baked apples, or have some dried fruit.  Tips for eating out  Try broiled, grilled, baked, or poached fish instead of having it fried or breaded.  Ask your server to have your meals prepared with olive oil instead of butter.  Order dishes made with marinara sauce or sauces made from olive oil. Avoid sauces made from cream or mayonnaise.  Choose whole-grain breads, whole wheat pasta and pizza crust,  brown rice, beans, and lentils.  Cut back on butter or margarine on bread. Instead, you can dip your bread in a small amount of olive oil.  Ask for a side salad or grilled vegetables instead of french fries or chips.  Where can you learn more?  Go to [facility] at [email or web address]  [facility] under the Resources menu. Enter O407 in the search box to learn more about Learning About the Mediterranean Diet.  Current as of: February 11, 2017  Content Version: 12.2  ?? 2006-2019 [employer]. Care instructions adapted under license by [facility]. If you have questions about a medical condition or this instruction, always ask your healthcare professional. [employer] disclaims any warranty or liability for your use of this information.            Stretching: Exercises  Your Care Instructions  Here are some examples of exercises for stretching. Start each exercise slowly. Ease off the exercise if you start to have pain.  Your doctor or physical therapist will tell you when you can start these exercises and which ones will work best for you.  How to do the exercises  Latissimus stretch    Stand with your back straight and your feet shoulder-width apart. You can do this stretch sitting down if you are not steady on your feet.  Hold your arms above your head, and hold one hand with the other.  Pull upward while leaning straight over toward your right side. Keep your lower body straight. You should feel the stretch along your left side.  Hold 15 to 30 seconds, and then switch sides.  Repeat 2 to 4 times for each side.  Triceps stretch    Stand with your back straight and your feet shoulder-width apart. You can do this stretch sitting down if you are not steady on your feet.  Bring your left elbow straight up while bending your arm.  Grab your left elbow with your right hand, and pull your left elbow toward your head with light pressure. If you are more flexible, you may pull your arm slightly behind your head. You will feel the stretch along the back of your arm.  Hold 15 to 30 seconds, and then switch elbows.  Repeat 2 to 4 times for each arm.  Calf stretch    Place your hands on a wall for balance. You can also do this with your hands on the back of a chair, a countertop, or a tree.  Step back with your left leg. Keep the leg straight, and press your left heel into the floor.  Press your hips forward, bending your right leg slightly. You will feel the stretch in your left calf.  Hold the stretch 15 to 30 seconds.  Repeat 2 to 4 times for each leg.  Quadriceps stretch    Lie on your side with one hand supporting your head.  Bend your upper leg back and grab your ankle with your other hand.  Stretch your leg back by pulling your foot toward your buttocks. You will feel the stretch in the front of your thigh. If this causes stress on your knees, do not do this stretch.  Hold the stretch 15 to 30 seconds.  Repeat 2 to 4 times for each leg.  Groin stretch    Sit on the floor and put the soles of your feet together. Do not slump your back.  Grab your ankles and gently pull your legs toward you.  Press your knees toward the floor. You will feel the stretch in your inner thighs.  Hold 15 to 30 seconds.  Repeat 2 to 4 times.  Hamstring stretch in doorway    Lie on the floor near a doorway, with your buttocks close to the wall.  Let the leg you are not stretching extend through the doorway.  Put the leg you want to stretch up on the wall, and straighten your knee to feel a gentle stretch at the back of your leg.  Hold the stretch for at least 15 to 30 seconds. Repeat 2 to 4 times.  Follow-up care is a key part of your treatment and safety. Be sure to make and go to all appointments, and call your doctor if you are having problems. It's also a good idea to know your test results and keep a list of the medicines you take.  Where can you learn more?  Go to [facility] at [email or web address]  Select Preferences in the upper right hand corner, then select Health Library under Resources. Enter P733 in the search box to learn more about Stretching: Exercises.  Current as of: March 13, 2016  Content Version: 11.6  ?? 2006-2018 [employer]. Care instructions adapted under license by [facility]. If you have questions about a medical condition or this instruction, always ask your healthcare professional. [employer] disclaims any warranty or liability for your use of this information.       Patient Education        Preventing Falls: Care Instructions  Injuries and health problems such as trouble walking or poor eyesight can increase your risk of falling. So can some medicines. But there are things you can do to help prevent falls. You can exercise to get stronger. You can also arrange your home to make it safer.    Talk to your doctor about the medicines you take. Ask if any of them increase the risk of falls and whether they can be changed or stopped.   Try to exercise regularly. It can help improve your strength and balance. This can help lower your risk of falling.     Practice fall safety and prevention.    Wear low-heeled shoes that fit well and give your feet good support. Talk to your doctor if you have foot problems that make this hard.  Carry a cellphone or wear a medical alert device that you can use to call for help.  Use stepladders instead of chairs to reach high objects. Don't climb if you're at risk for falls. Ask for help, if needed.  Wear the correct eyeglasses, if you need them.    Make your home safer.    Remove rugs, cords, clutter, and furniture from walkways.  Keep your house well lit. Use night-lights in hallways and bathrooms.  Install and use sturdy handrails on stairways.  Wear nonskid footwear, even inside. Don't walk barefoot or in socks without shoes.    Be safe outside.    Use handrails, curb cuts, and ramps whenever possible.  Keep your hands free by using a shoulder bag or backpack.  Try to walk in well-lit areas. Watch out for uneven ground, changes in pavement, and debris.  Be careful in the winter. Walk on the grass or gravel when sidewalks are slippery. Use de-icer on steps and walkways. Add non-slip devices to shoes.    Put grab bars and nonskid mats in your shower or tub and near the toilet. Try to use a  shower chair or bath bench when bathing.   Get into a tub or shower by putting in your weaker leg first. Get out with your strong side first. Have a phone or medical alert device in the bathroom with you.   Where can you learn more?  Go to [facility] at [email or web address] in the Menu. Enter G117 in the search box to learn more about Preventing Falls: Care Instructions.  Current as of: October 21, 2021               Content Version: 14.0  ?? 2006-2024 [employer].   Care instructions adapted under license by [facility]. If you have questions about a medical condition or this instruction, always ask your healthcare professional. [employer] disclaims any warranty or liability for your use of this information.

## 2022-10-07 NOTE — Unmapped (Addendum)
Thank kindly you for completing your Medicare Annual Wellness Visit today. If you have any questions about your Medicare Annual Wellness Visit please call our Care Manager, Johnanna Schneiders, RN CM at [phone]  Please see your updated Personalized Prevention Plan which details the status of your preventative services, agreed upon goals to improve your fitness and health, and educational materials on health topics specific to you.     Here is your personalized prevention plan based on your Annual Wellness Visit today.    Medicare Screening & Prevention Guidelines Recommendations Dates Completed HM Status and Next Due Follow-Up   AAA Ultrasound Once in men age 80 to 26 who have ever smoked or currently smoke. AAA screening date: 08/12/2016 Health Maintenance Summary     This patient has no relevant Health Maintenance data.       Not within age range   Colorectal Cancer Screening Patients 45 to 75: stool cards annually OR colonoscopy every 10 years (or more frequently if high risk) OR FIT-DNA every 3 years.  Colonoscopy date: Not Found  FOBT/FIT date: 12/02/2019  Sigmoidoscopy date: Not Found  FIT-DNA date: Not Found Health Maintenance Summary     This patient has no relevant Health Maintenance data.       Not within age range   DEXA Bone Density Measurement Patients age 87-95 to have a Dexa every 5 years in postmenopausal women and high risk males. Males will defer to PCP. DEXA date: 01/18/2021 Health Maintenance Summary     This patient has no relevant Health Maintenance data.       Up to date   Diabetes Eye Exam Annually if Diabetic Eye Exam date: 12/13/2020 Health Maintenance Summary    -      Overdue - Retinal Eye Exam (Yearly) Overdue since 12/13/2021    12/13/2020  SmartData: OPHTH FUNDUS OD PERIPHERY    12/13/2020  SmartData: FINDINGS - PE - EYES - FUNDUSCOPIC - PERIPHERY -   RIGHT PERIPHERY NORMAL    12/13/2020  SmartData: OPHTH FUNDUS OS PERIPHERY    12/13/2020  SmartData: FINDINGS - PE - EYES - FUNDUSCOPIC - PERIPHERY -   LEFT PERIPHERY NORMAL    12/13/2020  OCT, Retina - OU - Both Eyes    Only the first 5 history entries have been loaded, but more history   exists.           Patient reported done within past year   Diabetes Foot Exam Annually if Diabetic. Most Recent Foot Exam Date: 04/28/2022 Health Maintenance Summary    -      Foot Exam (Yearly) Next due on 04/29/2023    04/28/2022  HM DIABETES FOOT EXAM    12/19/2021  HM DIABETES FOOT EXAM    12/24/2020  SmartData: WORKFLOW - DIABETES - DIABETIC FOOT EXAM PERFORMED      11/28/2019  SmartData: WORKFLOW - DIABETES - DIABETIC FOOT EXAM PERFORMED             Up to date   Diabetes urine Albumin/Creatinine Ratio Annually if Diabetic UACR Date: 07/30/2022 Health Maintenance Summary    -      Urine Albumin/Creatinine Ratio (Yearly) Next due on 07/30/2023    07/30/2022  Albumin/Creatinine Ratio, Urine component of   Albumin/creatinine urine ratio    02/01/2021  Albumin/Creatinine Ratio, Urine component of Microalbumin /   creatinine urine ratio    08/17/2020  Albumin/Creatinine Ratio, Urine component of   Albumin/creatinine urine ratio    08/10/2019  Albumin/Creatinine Ratio, Urine component  of   Albumin/creatinine urine ratio             Up to date   Diabetes Hemoglobin A1c Every 3 or 6 months depending on last result Last Hemoglobin A1c Date: 07/30/2022 Health Maintenance Summary    -      Hemoglobin A1c (Every 6 Months) Next due on 01/29/2023    07/30/2022  HGB A1C, RAP/PDS component of POCT glycosylated hemoglobin   (Hb A1C)    04/28/2022  HGB A1C, RAP/PDS component of POCT glycosylated hemoglobin   (Hb A1C)    11/13/2021  Hemoglobin A1c component of Hemoglobin A1c    07/04/2021  HGB A1C, RAP/PDS component of POCT glycosylated hemoglobin   (Hb A1C)    04/05/2021  HGB A1C, RAP/PDS component of POCT glycosylated hemoglobin   (Hb A1C)    Only the first 5 history entries have been loaded, but more history   exists.           Up to date   Heart Disease Screening (fasting lipid panel) Minimum of every 5 years, patients age 32-75,  if no apparent signs or symptoms of heart disease. LDL date: 04/28/2022  Total choleseterol date: 04/28/2022  HDL date: 04/28/2022  Triglycerides date: 04/28/2022 Health Maintenance Summary     This patient has no relevant Health Maintenance data.       Up to date   Hepatitis C Screening A one-time screening for HCV infection for adults age 36 - 17 years old. HCV screening date: 11/28/2019 Health Maintenance Summary    -      Hepatitis C Screen  Completed    11/28/2019  Hepatitis C Ab component of Hepatitis C Antibody             Complete   Covid-19 Vaccine For persons 5 and older @[id number] @ Health Maintenance   Topic Date Due    COVID-19 Vaccine (1 - 2023-24 season) Never done        Provided information on how to obtain at pharmacy   Tdap Every 10 years (will not be covered by Medicare). Check with your pharmacy to see if you are eligible for this vaccine based on your insurance. DTap/Tdap/TD vaccination: 04/07/2002 Health Maintenance Summary    -      Overdue - DTaP/Tdap/Td Vaccines (1 - Tdap) Overdue since 04/08/2002    04/07/2002  Imm Admin: Td (adult) unspecified formulation    04/08/1995  Imm Admin: Td (adult) unspecified formulation           Provided information on how to obtain at pharmacy, health department, PCP office   Influenza Vaccine Annually  Influenza Vaccination: 03/07/2020   Health Maintenance Summary    -      Influenza Vaccine (1) Next due on 12/07/2022    03/07/2020  Imm Admin: INFLUENZA INJ MDCK PF, QUAD,(FLUCELVAX)(29MO AND UP   EGG FREE)    01/25/2019  Imm Admin: Influenza Vaccine Quad (IIV4 PF) 49mo+ injectable    04/01/2018  Imm Admin: Seasonal Trivalent Influenza Vaccine, Adjuvanted,   Pf    04/10/2017  Imm Admin: Influenza Virus Vaccine, unspecified formulation    04/10/2017  Imm Admin: Influenza Vaccine Quad (IIV4 PF) 69mo+ injectable    Only the first 5 history entries have been loaded, but more history   exists. Up to date   Pneumococcal Vaccine For people ?65, or with an underlying condition Pneumonia vaccination: 01/05/2015   Health Maintenance Summary    -      Pneumococcal Vaccine 65+ (  Series Information) Completed    01/05/2015  Imm Admin: Pneumococcal Conjugate 13-Valent    11/15/2013  Imm Admin: PNEUMOCOCCAL POLYSACCHARIDE 23             Complete   RSV Vaccine Current recommendations to have shared decision making with your PCP, please talk with your provider. Influenza Vaccination: Not Found Health Maintenance Summary     This patient has no relevant Health Maintenance data.     Provided information on how to obtain at pharmacy   Zoster Vaccine Healthy adults 50 years and older receive 2 doses of recombinant zoster vaccine two to six months apart (may not be covered by Medicare). Check with   your pharmacy to see if you are eligible for this vaccine . Zoster vaccination: Not Found Health Maintenance Summary    -      Overdue - Zoster Vaccines (1 of 2) Never done    No completion history exists for this topic.           Provided information on how to obtain at pharmacy-[facility]     Patient Education        Well Visit, Over 65: Care Instructions  Well visits can help you stay healthy. Your doctor has checked your overall health and may have suggested ways to take good care of yourself. Your doctor also may have recommended tests. You can help prevent illness with healthy eating, good sleep, vaccinations, regular exercise, and other steps.    Get the tests that you and your doctor decide on. Depending on your age and risks, examples might include hearing tests as well as screening for colon, breast, and lung cancer. Screening helps find diseases before any symptoms appear.   Eat healthy foods. Choose fruits, vegetables, whole grains, lean protein, and low-fat dairy foods. Limit saturated fat, and reduce salt.     Limit alcohol. Men should have no more than 2 drinks a day. Women should have no more than 1. For some people, no alcohol is the best choice.   Exercise. It can help prevent falls. Get at least 30 minutes of exercise on most days of the week. Walking, yoga, and tai chi can be good choices.     Reach and stay at your healthy weight. This will lower your risk for many health problems.   Take care of your mental health. Try to stay connected with friends, family, and community, and find ways to manage stress.     If you're feeling depressed or hopeless, talk to someone. A counselor can help. If you don't have a counselor, talk to your doctor.   Talk to your doctor if you think you may have a problem with alcohol or drug use. This includes prescription medicines and illegal drugs.     Avoid tobacco and nicotine: Don't smoke, vape, or chew. If you need help quitting, talk to your doctor.   Practice safer sex. Getting tested, using condoms or dental dams, and limiting sex partners can help prevent STIs.     Make an advance directive. This is a legal way to tell your family and doctor what you want to happen at the end of your life or when you can't speak for yourself.   Prevent problems where you can. Protect your skin from too much sun, wash your hands, brush your teeth twice a day, and wear a seat belt in the car.   Where can you learn more?  Go to [facility] at https://[facility]chart.[employer] fundraiser  Library in the Menu. Enter [phone] in the search box to learn more about Well Visit, Over 65: Care Instructions.  Current as of: November 10, 2021               Content Version: 14.0  ?? 2006-2024 [employer].   Care instructions adapted under license by [facility]. If you have questions about a medical condition or this instruction, always ask your healthcare professional. [employer] disclaims any warranty or liability for your use of this information.    Patient Education        Learning About Being Physically Active  What is physical activity?     Being physically active means doing any kind of activity that gets your body moving.  The types of physical activity that can help you get fit and stay healthy include:  Aerobic or cardio activities. These make your heart beat faster and make you breathe harder, such as brisk walking, riding a bike, or running. They strengthen your heart and lungs and build up your endurance.  Strength training activities. These make your muscles work against, or resist, something. Examples include lifting weights or doing push-ups. These activities help tone and strengthen your muscles and bones.  Stretches. These let you move your joints and muscles through their full range of motion. Stretching helps you be more flexible.  Reaching a balance between these three types of physical activity is important because each one contributes to your overall fitness.  What are the benefits of being active?  Being active is one of the best things you can do for your health. It helps you to:  Feel stronger and have more energy to do all the things you like to do.  Focus better at school or work.  Feel, think, and sleep better.  Reach and stay at a healthy weight.  Lose fat and build lean muscle.  Lower your risk for serious health problems, including diabetes, heart attack, high blood pressure, and some cancers.  Keep your heart, lungs, bones, muscles, and joints strong and healthy.  How can you make being active part of your life?  Start slowly. Make it your long-term goal to get at least 30 minutes of exercise on most days of the week. Walking is a good choice. You also may want to do other activities, such as running, swimming, cycling, or playing tennis or team sports.  Pick activities that you like--ones that make your heart beat faster, your muscles stronger, and your muscles and joints more flexible. If you find more than one thing you like doing, do them all. You don't have to do the same thing every day.  Get your heart pumping every day. Any activity that makes your heart beat faster and keeps it at that rate for a while counts.  Here are some great ways to get your heart beating faster:  Go for a brisk walk, run, or hike.  Go for a swim or bike ride.  Take an online exercise class or dance.  Play a game of touch football, basketball, or soccer.  Play tennis, pickleball, or racquetball.  Climb stairs.  Even some household chores can be aerobic. Just do them at a faster pace. Raking or mowing the lawn, sweeping the garage, and vacuuming and cleaning your home all can help get your heart rate up.  Strengthen your muscles during the week. You don't have to lift heavy weights or grow big, bulky muscles to get stronger. Doing a few simple  activities that make your muscles work against, or resist, something can help you get stronger. Aim for at least twice a week.  For example, you can:  Do push-ups or sit-ups, which use your own body weight as resistance.  Lift weights or dumbbells or use stretch bands at home or in a gym or community center.  Stretch your muscles often. Stretching will help you as you become more active. It can help you stay flexible and loosen tight muscles. It can also help improve your balance and posture and can be a great way to relax.  Be sure to stretch the muscles you'll be using when you work out. It's best to warm your muscles slightly before you stretch them. Walk or do some other light aerobic activity for a few minutes. Then start stretching.  When you stretch your muscles:  Do it slowly. Stretching is not about going fast or making sudden movements.  Don't push or bounce during a stretch.  Hold each stretch for at least 15 to 30 seconds, if you can. You should feel a stretch in the muscle, but not pain.  Breathe out as you do the stretch. Then breathe in as you hold the stretch. Don't hold your breath.  If you're worried about how more activity might affect your health, have a checkup before you start. Follow any special advice your doctor gives you for getting a smart start.  Where can you learn more?  Go to [facility] at [email or web address] in the Menu. Enter [phone] in the search box to learn more about Learning About Being Physically Active.  Current as of: September 09, 2021               Content Version: 14.0  ?? 2006-2024 [employer].   Care instructions adapted under license by [facility]. If you have questions about a medical condition or this instruction, always ask your healthcare professional. [employer] disclaims any warranty or liability for your use of this information.       Patient Education        Preventing Falls: Care Instructions  Injuries and health problems such as trouble walking or poor eyesight can increase your risk of falling. So can some medicines. But there are things you can do to help prevent falls. You can exercise to get stronger. You can also arrange your home to make it safer.    Talk to your doctor about the medicines you take. Ask if any of them increase the risk of falls and whether they can be changed or stopped.   Try to exercise regularly. It can help improve your strength and balance. This can help lower your risk of falling.     Practice fall safety and prevention.    Wear low-heeled shoes that fit well and give your feet good support. Talk to your doctor if you have foot problems that make this hard.  Carry a cellphone or wear a medical alert device that you can use to call for help.  Use stepladders instead of chairs to reach high objects. Don't climb if you're at risk for falls. Ask for help, if needed.  Wear the correct eyeglasses, if you need them.    Make your home safer.    Remove rugs, cords, clutter, and furniture from walkways.  Keep your house well lit. Use night-lights in hallways and bathrooms.  Install and use sturdy handrails on stairways.  Wear nonskid footwear, even inside. Don't walk barefoot  or in socks without shoes.    Be safe outside.    Use handrails, curb cuts, and ramps whenever possible.  Keep your hands free by using a shoulder bag or backpack.  Try to walk in well-lit areas. Watch out for uneven ground, changes in pavement, and debris.  Be careful in the winter. Walk on the grass or gravel when sidewalks are slippery. Use de-icer on steps and walkways. Add non-slip devices to shoes.    Put grab bars and nonskid mats in your shower or tub and near the toilet. Try to use a shower chair or bath bench when bathing.   Get into a tub or shower by putting in your weaker leg first. Get out with your strong side first. Have a phone or medical alert device in the bathroom with you.   Where can you learn more?  Go to [facility] at [email or web address].[email or web address] [email or web address]/[email or web address] in the Menu. Enter G117 in the search box to learn more about Preventing Falls: Care Instructions.  Current as of: October 21, 2021               Content Version: 14.0  ?? 2006-2024 [employer].   Care instructions adapted under license by [facility]. If you have questions about a medical condition or this instruction, always ask your healthcare professional. [employer] disclaims any warranty or liability for your use of this information.       Patient Education        Fatigue: Care Instructions  Overview     Fatigue is a feeling of tiredness, exhaustion, or lack of energy. You may feel fatigue because of too much or not enough activity. It can also come from stress, lack of sleep, boredom, and poor diet. Many medical problems, such as viral infections, can cause fatigue. Emotional problems, especially depression, are often the cause of fatigue.  Fatigue is most often a symptom of another problem. Treatment for fatigue depends on the cause. For example, if you have fatigue because you have a certain health problem, treating this problem also treats your fatigue. If depression or anxiety is the cause, treatment may help.  Follow-up care is a key part of your treatment and safety. Be sure to make and go to all appointments, and call your doctor if you are having problems. It's also a good idea to know your test results and keep a list of the medicines you take.  How can you care for yourself at home?  Get regular exercise. But try not to overdo it. It may help to go back and forth between rest and exercise.  Get plenty of rest.  Eat a variety of healthy foods. Try not to skip any meals.  Avoid or try to cut back on your use of caffeine, tobacco, and alcohol. Caffeine is most often found in coffee, tea, cola drinks, and energy drinks.  Limit medicines that can cause fatigue. These include medicines such as cold and allergy medicines.  When should you call for help?  Watch closely for changes in your health, and be sure to contact your doctor if:    You have new symptoms such as fever or a rash.     Your fatigue gets worse.     You have been feeling down, depressed, or hopeless. Or you may have lost interest in things that you usually enjoy.     You are not getting better as expected.   Where can you learn more?  Go to [facility] at [email or web address] in the Menu. Enter [phone] in the search box to learn more about Fatigue: Care Instructions.  Current as of: September 28, 2021               Content Version: 14.0  ?? 2006-2024 [employer].   Care instructions adapted under license by [facility]. If you have questions about a medical condition or this instruction, always ask your healthcare professional. [employer] disclaims any warranty or liability for your use of this information.          If you need care after 5:00 pm during the week or on the weekend: Call [facility] at [phone] for advice.

## 2022-10-08 ENCOUNTER — Ambulatory Visit: Admit: 2022-10-08 | Discharge: 2022-10-09 | Payer: MEDICARE

## 2022-10-08 DIAGNOSIS — Z862 Personal history of diseases of the blood and blood-forming organs and certain disorders involving the immune mechanism: Principal | ICD-10-CM

## 2022-10-08 DIAGNOSIS — N1832 Type 2 diabetes mellitus with stage 3b chronic kidney disease, without long-term current use of insulin (CMS-HCC): Principal | ICD-10-CM

## 2022-10-08 DIAGNOSIS — I2510 Atherosclerotic heart disease of native coronary artery without angina pectoris: Principal | ICD-10-CM

## 2022-10-08 DIAGNOSIS — E1122 Type 2 diabetes mellitus with diabetic chronic kidney disease: Principal | ICD-10-CM

## 2022-10-08 DIAGNOSIS — R413 Other amnesia: Principal | ICD-10-CM

## 2022-10-08 LAB — CBC W/ AUTO DIFF
BASOPHILS ABSOLUTE COUNT: 0.1 10*9/L (ref 0.0–0.1)
BASOPHILS RELATIVE PERCENT: 1.2 %
EOSINOPHILS ABSOLUTE COUNT: 0.2 10*9/L (ref 0.0–0.5)
EOSINOPHILS RELATIVE PERCENT: 2.9 %
HEMATOCRIT: 41.4 % (ref 39.0–48.0)
HEMOGLOBIN: 13.6 g/dL (ref 12.9–16.5)
LYMPHOCYTES ABSOLUTE COUNT: 1.6 10*9/L (ref 1.1–3.6)
LYMPHOCYTES RELATIVE PERCENT: 19.2 %
MEAN CORPUSCULAR HEMOGLOBIN CONC: 32.9 g/dL (ref 32.0–36.0)
MEAN CORPUSCULAR HEMOGLOBIN: 29 pg (ref 25.9–32.4)
MEAN CORPUSCULAR VOLUME: 88.1 fL (ref 77.6–95.7)
MEAN PLATELET VOLUME: 9 fL (ref 6.8–10.7)
MONOCYTES ABSOLUTE COUNT: 0.5 10*9/L (ref 0.3–0.8)
MONOCYTES RELATIVE PERCENT: 6.3 %
NEUTROPHILS ABSOLUTE COUNT: 5.8 10*9/L (ref 1.8–7.8)
NEUTROPHILS RELATIVE PERCENT: 70.4 %
NUCLEATED RED BLOOD CELLS: 0 /100{WBCs} (ref <=4)
PLATELET COUNT: 263 10*9/L (ref 150–450)
RED BLOOD CELL COUNT: 4.7 10*12/L (ref 4.26–5.60)
RED CELL DISTRIBUTION WIDTH: 13.8 % (ref 12.2–15.2)
WBC ADJUSTED: 8.3 10*9/L (ref 3.6–11.2)

## 2022-10-08 LAB — HEPATIC FUNCTION PANEL
ALBUMIN: 4.2 g/dL (ref 3.4–5.0)
ALKALINE PHOSPHATASE: 73 U/L (ref 38–126)
ALT (SGPT): 24 U/L (ref <50)
AST (SGOT): 31 U/L (ref 19–55)
BILIRUBIN DIRECT: 0.2 mg/dL (ref 0.00–0.40)
BILIRUBIN TOTAL: 0.6 mg/dL (ref 0.1–1.2)
PROTEIN TOTAL: 7.1 g/dL (ref 6.5–8.3)

## 2022-10-08 LAB — BASIC METABOLIC PANEL
ANION GAP: 9 mmol/L (ref 7–15)
BLOOD UREA NITROGEN: 13 mg/dL (ref 7–21)
BUN / CREAT RATIO: 11
CALCIUM: 9.8 mg/dL (ref 8.5–10.2)
CHLORIDE: 106 mmol/L (ref 98–107)
CO2: 24 mmol/L (ref 22.0–32.0)
CREATININE: 1.2 mg/dL (ref 0.70–1.30)
EGFR CKD-EPI (2021) MALE: 62 mL/min/{1.73_m2} (ref >=60)
GLUCOSE RANDOM: 145 mg/dL — ABNORMAL HIGH (ref 74–106)
POTASSIUM: 4.3 mmol/L (ref 3.5–5.0)
SODIUM: 139 mmol/L (ref 135–145)

## 2022-10-08 LAB — LIPID PANEL
CHOLESTEROL/HDL RATIO SCREEN: 2 (ref 1.0–4.5)
CHOLESTEROL: 105 mg/dL (ref 100–199)
HDL CHOLESTEROL: 52 mg/dL (ref >=40)
LDL CHOLESTEROL CALCULATED: 24 mg/dL — ABNORMAL LOW (ref 60–99)
NON-HDL CHOLESTEROL: 53 mg/dL
TRIGLYCERIDES: 143 mg/dL (ref <150)
VLDL CHOLESTEROL CAL: 28.6 mg/dL (ref 0–40)

## 2022-10-08 LAB — IRON & TIBC
IRON SATURATION: 20 %
IRON: 78 ug/dL (ref 37–170)
TOTAL IRON BINDING CAPACITY: 399 ug/dL (ref 252–479)

## 2022-10-09 LAB — FOLATE: FOLATE: 14 ng/mL (ref >=5.4)

## 2022-10-09 LAB — RETICULOCYTES
RETICULOCYTE ABSOLUTE COUNT: 62.1 10*9/L (ref 23.0–100.0)
RETICULOCYTE COUNT PCT: 1.34 % (ref 0.50–2.17)

## 2022-10-09 LAB — FERRITIN: FERRITIN: 23 ng/mL

## 2022-10-09 LAB — VITAMIN B12: VITAMIN B-12: 438 pg/mL (ref 211–911)

## 2022-10-10 MED FILL — REPATHA SURECLICK 140 MG/ML SUBCUTANEOUS PEN INJECTOR: SUBCUTANEOUS | 28 days supply | Qty: 2 | Fill #4

## 2022-10-11 NOTE — Unmapped (Signed)
Assessment/Plan     Diagnoses and all orders for this visit:    Memory changes  Stable present.  Continue current plan of care.  -     Folate Level; Future  -     Vitamin B12 Level; Future    Type 2 diabetes mellitus with stage 3b chronic kidney disease, without long-term current use of insulin (CMS-HCC)  Stable overall. Well controlled overall. Continue current regimen.  -     CBC w/ Differential; Future  -     Basic Metabolic Panel; Future  -     Hepatic Function Panel; Future  -     Lipid Panel; Future    Stage 3a chronic kidney disease (CMS-HCC)  Clinically asymptomatic.   Standard education and anticipatory guidance regarding goals, objectives, and sequelae.  Continue current management and plan of care.  Continue aggressive risk factor reduction.    Coronary artery disease involving native coronary artery of native heart, unspecified whether angina present  Clinically asymptomatic.   Standard education and anticipatory guidance regarding goals, objectives, and sequelae.  Continue current management and plan of care.  Continue aggressive risk factor reduction.  -     CBC w/ Differential; Future  -     Basic Metabolic Panel; Future  -     Hepatic Function Panel; Future  -     Lipid Panel; Future    History of ischemic stroke  Clinically stable.  Standard education and anticipatory guidance regarding goals, objectives, and sequelae.  Continue current management and plan of care.  Continue aggressive risk factor reduction.    Peripheral polyneuropathy  Clinically stable.  Standard education and anticipatory guidance regarding goals, objectives, and sequelae.  Continue current management and plan of care.  Continue aggressive risk factor reduction.    History of anemia  Clinically asymptomatic. No evidence of ongoing blood loss.  -     CBC w/ Differential; Future  -     Iron & TIBC; Future  -     Ferritin; Future  -     Folate Level; Future  -     Vitamin B12 Level; Future  -     Reticulocytes; Future    Interstitial pulmonary disease, unspecified (CMS-HCC)  Clinically stable.  Standard education and anticipatory guidance regarding goals, objectives, and sequelae.  Continue current management and plan of care.  Continue aggressive risk factor reduction.    Intertrigo  Standard education and anticipatory guidance regarding goals, objectives, and sequelae.  Continue current management and plan of care.  Continue aggressive risk factor reduction.  -     clotrimazole (LOTRIMIN) 1 % cream; Apply topically two (2) times a day.      Disposition: Return in about 2 months (around 12/08/2022) for Recheck, Memory, Mood.  12/10/2022      Diagnoses and plan along with any newly prescribed medication(s), recommendations, orders and therapies were discussed in detail with patient today.   Standard education and anticipatory guidance regarding goals, objectives and potential sequelae were reviewed. Patient voiced appropriate understanding of assessment and plan of care.    Pertinent handouts were given today and reviewed with the patient as indicated.  The Care Plan and Self-Management goals have been included on the AVS and the AVS has been printed.  Where helpful for monitoring and management I have encouraged the patient to keep regular logs for me to review at their next visit. Any outside resources or referrals needed at this time are noted above. No additional referrals necessary  at this time. Patient voiced understanding and all questions have been answered to satisfaction.     To complete this documentation in a timely manner, portions of this encounter may have been completed utilizing Dragon voice recognition software. Please take this into consideration in noting nuances of documentation, grammar, punctuation, and wording. If and when a question exists, or for clarification, please contact the author.    Barron Alvine, MD  [facility]  [address]  [place], [name] [address]  Phone [phone]    Subjective:     [id number], [name], MD    Jesus Rubio, is a 77 y.o. male    Chief Complaint   Patient presents with    Memory Loss     Patient states he feels like everything ok. Patient states that he does have spells where he feels like memory gets fuzzy. And has a hard time trying to talk.        HPI:    Patient presents: Alone.    Nursing notes and history reviewed with patient.    Memory changes.  Feels that memory is stable.  Sometimes he gets more stressed difficult.  Otherwise does fine.  Denies new concerning symptoms.  No acute strokelike symptoms.    Diabetes Mellitus, type 2 diabetes.  Denies recent concerning symptoms.  Goal A1c less than 8.  No specific hypo or hyperglycemic symptomatology.  Compliant with previously outlined recommendations including trying to work better on diet.  Denies side effects from medications.    Stage III chronic kidney disease.   No urinary tract symptoms, shortness of breath, abdominal swelling, lower extremity edema, orthopnea, pnd.  Compliant with renal protection recommendations.    Coronary artery disease.  Compliant with therapy. No chest pain, shortness of breath, orthopnea, PND.  No increase nitroglycerin use.  No bleeding complications from aspirin therapy noted.    History of ischemic stroke.  Denies recent strokelike symptoms.    Peripheral neuropathy.  Reports stable.  Denies new concerning symptoms.     History of anemia.  No evidence of ongoing blood loss.    Interstitial lung disease.  Notes relevant data reviewed.    Intertrigo.  Waxing waning symptomatology.  Most recently concerning symptoms.  No current treatment or interventions.    No other complaints.  No additional recent health care visits elsewhere. No urgent care or ED visits.  No additional palliative, provocative, modifying, quantifying, qualifying, or other related factors.  Unless otherwise outlined above patient is pleased with current medication regimen, admits compliance and denies side effects of medication.    The following portions of the patient's history were reviewed and updated as appropriate: allergies, current medications, past family history, past medical history, past social history, past surgical history and problem list.    ROS: Pertinent positives and negatives are as outlined in the HPI above. The remaining balance of the 12 point review of systems is otherwise benign.      [facility] Components:      Goals         <130/80       Lack of exercise or physical activity    Increase exercise or physical activity        Get heart rate stabilized (pt-stated)         Things to think about to help me reach my goal:     What are you going to do? Take medications as prescribed, Keep positive thinking, Pace activities,  Stay hydrated   How and how much? Daily   How frequent? Daily   Barriers to success?    Solutions to barriers?      06/04/17: Patient continues to work on this goal   09/29/17 Patient continues to work on this goal   12-09-2017: Reports he continues to work on this and is staying positive.        Take actions to prevent falling       12-03-2016: New goal: Will change positions slowly and pay attention to pathways. Uses cane or walker at times.   09/29/17 Patient continues to work on this goal   12-09-2017: Reports 1 fall 6-8 months ago when he missed a step and fell. Denies injury. Uses cane occasionally.        Weight < 90.7 kg (200 lb)       October 07, 2022 11:24 AM AWV Goal   Things to think about to help me reach my goal:     What are you going to do? Lose weight   How and how much? 20 lbs   How frequent? daily   Barriers to success? None identified   Solutions to barriers? N/a                     Family characteristics, patient's social characteristics, patient's cultural characteristics, patient's communication needs, patient's health literacy and behaviors affecting patient's health are outlined under the patient's History Section, Longitudinal Care Plan or Problem List in Epic or as pertinent in History of Present Illness.    Barriers to goals identified and addressed.   See Assessment and Plan for additional pertinent details.    Objective:     Physical Exam   BP 138/82  - Pulse 74  - Temp 36.6 ??C (97.8 ??F) (Oral)  - Resp 16  - Ht 182.9 cm (6' 0.01)  - Wt 98.9 kg (218 lb)  - SpO2 95%  - BMI 29.56 kg/m??   Constitutional: Oriented to person, place, and time. Appears well-developed and well-nourished. No distress.   Head: Normocephalic and atraumatic.   Mouth/Throat: Oropharynx is without mass or concerning lesions. Moist mucous membranes.    Eyes: Conjunctivae are normal. No scleral icterus.   Neck: Neck supple. Normal range of motion. No JVD present. No thyromegaly present. No concerning neck mass.   Cardiovascular: Normal rate. Regular rhythm. Normal heart sounds. No murmur heard.  Pulmonary/Chest: Effort normal. No respiratory distress. Breath sounds normal. No wheezes. No rhonchi. No rales. No focal breath sound changes. No chest tenderness.  Abdominal: Soft. Non-distended. No abdominal mass. No tenderness. No rebound. No guarding.   GU: Normal male phallus. Two symmetric scrotal testes. No masses. No hernias.  Rash consistent with intertrigo.  Musculoskeletal: No active synovitis. No concerning joint erythema, warmth or swelling.   Range of motion is without evidence of significant impairment.   Strength is without evidence of significant impairment.   No edema of the extremities.   Lymphadenopathy: There is no lymphadenopathy about the head or neck.  Neurological: Alert and oriented to person, place, and time. No cranial nerve deficit. Normal muscle tone. Normal coordination.  Skin: Skin is warm and dry. No pallor. No rash noted.   Psychiatric: Normal mood, affect and behavior. Judgment and thought content normal.

## 2022-10-12 NOTE — Unmapped (Signed)
Labs are overall OK.  Continue current medications and recommendations.  Keep planned follow-up appointment.

## 2022-10-29 ENCOUNTER — Ambulatory Visit: Admit: 2022-10-29 | Discharge: 2022-10-30 | Payer: MEDICARE | Attending: Family Medicine | Primary: Family Medicine

## 2022-10-29 ENCOUNTER — Ambulatory Visit: Admit: 2022-10-29 | Discharge: 2022-10-30 | Payer: MEDICARE | Attending: Internal Medicine | Primary: Internal Medicine

## 2022-10-29 DIAGNOSIS — Z789 Other specified health status: Principal | ICD-10-CM

## 2022-10-29 DIAGNOSIS — T466X5A Adverse effect of antihyperlipidemic and antiarteriosclerotic drugs, initial encounter: Principal | ICD-10-CM

## 2022-10-29 DIAGNOSIS — I771 Stricture of artery: Principal | ICD-10-CM

## 2022-10-29 DIAGNOSIS — I152 Hypertension secondary to endocrine disorders: Principal | ICD-10-CM

## 2022-10-29 DIAGNOSIS — I701 Atherosclerosis of renal artery: Principal | ICD-10-CM

## 2022-10-29 DIAGNOSIS — E785 Hyperlipidemia, unspecified: Principal | ICD-10-CM

## 2022-10-29 DIAGNOSIS — M791 Myalgia, unspecified site: Principal | ICD-10-CM

## 2022-10-29 DIAGNOSIS — I480 Paroxysmal atrial fibrillation: Principal | ICD-10-CM

## 2022-10-29 DIAGNOSIS — I4892 Unspecified atrial flutter: Principal | ICD-10-CM

## 2022-10-29 DIAGNOSIS — E1169 Type 2 diabetes mellitus with other specified complication: Principal | ICD-10-CM

## 2022-10-29 DIAGNOSIS — E1159 Type 2 diabetes mellitus with other circulatory complications: Principal | ICD-10-CM

## 2022-10-29 DIAGNOSIS — I259 Chronic ischemic heart disease, unspecified: Principal | ICD-10-CM

## 2022-10-29 DIAGNOSIS — I2089 Chronic stable angina (CMS-HCC): Principal | ICD-10-CM

## 2022-10-29 DIAGNOSIS — I240 Acute coronary thrombosis not resulting in myocardial infarction: Principal | ICD-10-CM

## 2022-10-29 NOTE — Unmapped (Signed)
KEep up the good work. No medication changes today. Lets catch up in 3 months    [name], MD, PhD  [facility]  Cardiology

## 2022-10-29 NOTE — Unmapped (Signed)
[facility] CARDIOLOGY    [facility]  [address]  [place], [name] [address]  Ph: [phone]  Fax: [phone] Specialty Care at [facility]  [address], [address]  [place], [name] [address]   Ph: [phone]  Fax: [phone]     Date of Service: 10/29/2022    PCP: [name], MD  Referring Provider: [name], PA    ASSESSMENT and PLAN:     Jesus Rubio is a 77 y.o. male presenting at the request of [name], PA for evaluation and management of:  1. Chronic stable angina (CMS-HCC)    2. 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 Hypertension associated with diabetes (CMS-HCC)    4. Hyperlipidemia associated with type 2 diabetes mellitus (CMS-HCC)    5. Type 2 diabetes mellitus with other specified complication, without long-term current use of insulin (CMS-HCC)    6. Paroxysmal atrial fibrillation (CMS-HCC)    7. Renal artery stenosis (CMS-HCC)    8. Subclavian arterial stenosis (CMS-HCC)    9. Statin intolerance    10. Atrial flutter, unspecified type (CMS-HCC)    11. Myalgia due to statin      Jesus Rubio presents in follow up.  I last saw him 08/2022. He was having a little more CP at his last visit so we increased imdur. This seems to have helped. Overall he seems to be doing fairly well. He is doing things around the house and a little bit of walking. He has not had significant chest pain. He does have SOB with exertion and fatigue but this is stable. We discussed that for the time being our goal will be to monitor him closely and maintain his current status. If he has concerning changes he will let me know. Follow up 3 months.     1. 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 NSTEMI while hospitalized for COVID, managed medically with plavix. Plavix was later discontinued in favor of eliquis due to PE  - anti-platelets: plavix 6 months post PCI renal artery stenosis => switch to aspirin 12/2021  - anti-anginal: metoprolol succinate 100mg  BID, imdur 60mg  daily  - statin: atorvastatin 40mg  daily => 10mg  ?muscle aches on higher dose => crestor 20mg  daily => Repatha, stop crestor  - last echo: 05/12/20 normal EF  - last stress/angiography:04/2021 see below. New since 2016 is CTO left main    2. Afib and Atrial flutter s/p ablation x2. Recurrence of paroxysmal Afib during hospitalization for COVID19 03/2020-04/2020. ER visit 02/2021 for symptomatic afib. No clear trigger.  - CHADS2-Vasc score: 6 (HTN, age+2, PE and CVA+2, IHD)  - Anti-coagulation: Eliquis  - Rate control: metoprolol as above, additional 50mg  prn dose if needed for sustained palpitations  - Rhythm control: not indicated    3. COVID19 with lengthy, complicated hospitalization 12/21-1/22. ARDS, NSTEMI, GI bleed, ARF requiring dialysis, occipital lobe CVA, PE  - has SOB with exertion, waiting for home O2    4. History of PE  - on eliquis    5. R subclavian stenosis  - BP in left arm    6. 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     [name], MD, PhD  [facility] Cardiology      ORDERS THIS VISIT:     No orders of  the defined types were placed in this encounter.     New Prescriptions    No medications on file      Modified Medications    No medications on file      Discontinued Medications    No medications on file        SUBJECTIVE:     ID: Jesus Rubio is a 77 y.o. male with a history of CAD s/p CABG, aflutter s/p ablation, COVID19 with numerous complications during hospitalization 03/2020-04/2020    Reason for Visit: Follow-up     Cardiovascular History:    Previously evaluated by [name] and last saw him 05/2019. He has a history of CABG and aflutter s/p ablation. Ziopatch 06/2019 did not show recurrence of aflutter. Prolonged hospitalization at [facility] from 03/17/20 to 04/17/20 for COVID19 with hypoxic respiratory failure due to ARDS, bilateral subsegmental PE, GI bleed requiring transfusion, NSTEMI, afib, L occipital lobe CVA.    Presented to [facility] ER 07/29/20 after an episode of syncope. ECG showed sinus rhythm with RBBB. A ziopatch was ordered. Ziopatch showed 16 short SVT episodes, appear to be atrial tach.    12/2020 right sided rib fractures    02/26/21 ER visit for afib with RVR and HTN. Prescribed prn diltiazem by ER in addition to BID metoprolol.    04/2021 PET stress for evaluation of CP, showing large reversible apical/anterior/septal defect. Referred for [facility]): interval proximal occlusion of the left main, LIMA is patent but mid LAD occlusion prevents backfilling of the proximal LAD. Unsuccessful attempt to pass a wire through CTO left main. All grafts patent. RAS 70% on the R. Also found to have differential of >13mmHg SBP/DBP between L and R arm.     05/2021 reviewed case with [name]. Plan for treatment of RAS. Do not think coronary intervention will be worth the risk. S/p DES to right renal artery. Angiography also showed right subclavian stenosis.    Interval History:    No significant chest pain or palpitations. No nitroglycerin since last visit in May.    Generally feels relatively weak. Doing things around the house and does ok.    Review of Systems  10 systems were reviewed and negative except as noted in HPI.    Cardiovascular History and Pertinent Past Medical History:  CAD s/p CABG   Aflutter s/p ablation  Afib  COVID19 03/2020 with prolonged hospitalization  Pulmonary embolism 03/2020  CVA 03/2020  RAS s/p R renal artery PCI 05/2021  R subclavian stenosis     has a past medical history of Acute hypoxemic respiratory failure (CMS-HCC) (03/17/2020), Alcoholism (CMS-HCC), Anxiety, Arthritis, At risk for falls, B12 deficiency, CHF (congestive heart failure) (CMS-HCC), Coronary artery disease, Diabetes mellitus (CMS-HCC), Financial difficulties, Hearing impairment, Heart disease, Hyperlipemia, Hypertension, Joint pain, Major depressive disorder, Memory changes (10/10/2022), Peptic ulceration, PONV (postoperative nausea and vomiting), Renal disorder, and Stage 3a chronic kidney disease (CMS-HCC) (07/11/2014).    Prior Cardiovascular Interventions / Surgery:  CABG 2006 LIMA-LAD,  SVG-OM, SVG-PDA  Aflutter ablation 2016, and 2017  R renal artery PCI 05/2021 [facility]    Prior Cardiovascular Diagnostics:    ECG 06/28/20 sinus rhythm 77bpm, RBBB with non-specific ST/TW abnormalities  ECG 02/26/21 afib 120bpm, RBBB  ECG 03/05/21 sinus rhythm with PACs, RBBB  ECG 04/04/21 sinus rhythm with PACs, RBBB  ECG 07/04/21 sinus rhythm, RBBB    Echo 04/11/20 EF normal  Echo 12/2020 normal EF, normal RV  Echo 10/2021 normal EF  Ziopatch 07/2020  - 16 short SVT episodes were recorded, the longest lasting 7.7 seconds. These appear to be atrial tachycardia.   - Otherwise unremarkable 14 day ambulatory cardiac monitor without patient triggered events.     PET Stress 04/2021  - Abnormal myocardial perfusion study  - There is a large in size, moderately severe, completely reversible perfusion defect involving the apical, apical anterior, mid anterior, basal anterior, apical septal, mid anteroseptal, basal anteroseptal, mid inferoseptal and basal inferoseptal segments. This is consistent with probable ischemia.  - Left ventricular systolic function is normal. Post stress the ejection fraction is > 60%.  - Coronary and aortic calcifications are noted  - Aortic valve calcifications are noted  - Attenuation CT scan shows post CABG findings  - Status post cholecystectomy  - Nonspecific bilateral reticular changes in the lungs, including areas of atelectasis    [facility]HC 05/04/21  Small area of lateral wall hypokinesis with preserved LV systolic function.  Coronary artery disease including proximal occlusion of the left main and long 70% lesion in the mid-RCA.  Prior coronary bypass surgery with patent LIMA to distal LAD, patent SVG to OM1 and OM2 and patent SVG to PDA.   70% right renal artery stenosis. Widely patent left renal artery.  Interval change since angiography on 07/14/14 was total occlusion of left main. Proximal LAD was now supplied by collaterals from PDA since occlusion of mid-LAD (present in 2016) prevented backfilling of the proximal LAD from LIMA to LAD graft.  Unsuccessful attempt to pass a wire through totally occluded left main.      Other Surgical History:   has a past surgical history that includes Replacement total knee; Cholecystectomy; Thyroidectomy, partial; Prostate surgery; Foot arthrodesis, triple; Cardiac surgery (2006); Joint replacement; pr upper gi endoscopy,biopsy (N/A, 12/01/2013); pr compre ep eval abltj 3d mapg tx svt (N/A, 05/16/2015); Knee arthroscopy; pr colonoscopy flx dx w/collj spec when pfrmd (N/A, 04/03/2020); pr gi imag intraluminal esophagus-ileum w/i&r (N/A, 04/05/2020); pr cath place/coron angio, img super/interp,w left heart ventriculography (N/A, 05/03/2021); and pr revascularize fem/pop artery,angioplasty/stent (N/A, 06/03/2021).    Current Outpatient Medications   Medication Instructions    acetaminophen (TYLENOL) 650 mg, Oral, Every 6 hours PRN    albuterol HFA 90 mcg/actuation inhaler 2 puffs, Inhalation, Every 6 hours PRN    aspirin (ECOTRIN) 81 mg, Oral, Daily (standard)    blood sugar diagnostic (ONETOUCH VERIO TEST STRIPS) Strp USE TO CHECK BLOOD SUGAR 1-2 TIMES PER DAY    blood-glucose meter kit Check BS every day    capsaicin (ZOSTRIX) 0.025 % cream Topical, 2 times a day (standard), Use gloves to apply. Wash hands after use. Avoid contact with face, skin folds and mucous membranes.    clotrimazole (LOTRIMIN) 1 % cream Topical, 2 times a day (standard)    cyanocobalamin (vitamin B-12) 1,000 mcg, Subcutaneous, Every 30 days    dilTIAZem (CARDIZEM) 30 MG tablet 1 tab Q3 hr PRN bad palpitations, HR>110.  Be sure top number blood pressure over 95.    donepezil (ARICEPT) 5 mg, Oral, Nightly    ELIQUIS 5 mg, Oral, 2 times a day (standard)    empty container (SHARPS-A-GATOR DISPOSAL SYSTEM) Misc Use as directed for sharps disposal    ergocalciferol-1,250 mcg (50,000 unit) (DRISDOL) 1,250 mcg, Oral, Weekly    evolocumab (REPATHA SURECLICK) 140 mg/mL PnIj Inject the contents of 1 pen (140 mg total) under the skin every fourteen (14) days.    ferrous sulfate 325 mg, Oral, 2 times a day (  standard)    fluticasone propionate (FLONASE) 50 mcg/actuation nasal spray 2 sprays, Each Nare, Daily (standard)    gabapentin (NEURONTIN) 100 mg, Oral, 3 times a day (standard)    glipiZIDE (GLUCOTROL) 5 mg, Oral, Every morning    isosorbide mononitrate (IMDUR) 60 mg, Oral, Daily (standard)    metoPROLOL succinate (TOPROL-XL) 100 MG 24 hr tablet TAKE 2 TABLETS BY MOUTH EVERY DAY    miscellaneous medical supply Misc Glucometer  Dx: E11.9    miscellaneous medical supply Misc Glucometer test strips   Dx: E11.9    miscellaneous medical supply Misc Lancets  Dx  E11.9    nitroglycerin (NITROLINGUAL) 0.4 mg/dose spray 1 spray, Sublingual, Every 5 min PRN    omeprazole (PRILOSEC) 20 mg, Oral, 2 times a day (standard)    pramoxine 1 % Crea Apply up to three times daily. Limit to 7 days consistent use.    sucralfate (CARAFATE) 1 g, Oral, 3 times a day (standard), For stomach protection due to anemia / history of gastritis.    traZODone (DESYREL) 50 MG tablet TAKE 2 TABLETS BY MOUTH EVERY DAY AT NIGHT      Allergies:  is allergic to escitalopram oxalate, ace inhibitors, and statins-hmg-coa reductase inhibitors.    Social History:  He  reports that he quit smoking about 50 years ago. His smoking use included cigarettes. He started smoking about 65 years ago. He has a 15 pack-year smoking history. He has been exposed to tobacco smoke. He has never used smokeless tobacco. He reports that he does not drink alcohol and does not use drugs.    Family History:  His family history includes Cancer in his brother, brother, brother, father, mother, sister, and sister; Depression in his mother; Heart attack in his brother, father, and mother; Lung cancer in his brother.       OBJECTIVE:      BP 134/78 (BP Site: L Arm, BP Position: Sitting, BP Cuff Size: Large)  - Pulse 71  - Temp 37.2 ??C (99 ??F) (Temporal)  - Resp 18  - Ht 182.9 cm (6' 0.01)  - Wt 97.9 kg (215 lb 12.8 oz)  - SpO2 96%  - BMI 29.26 kg/m??      Wt Readings from Last 12 Encounters:   10/29/22 97.9 kg (215 lb 12.8 oz)   10/07/22 98.9 kg (218 lb)   10/07/22 98.9 kg (218 lb)   09/08/22 (!) 101.6 kg (224 lb)   08/14/22 (!) 104.7 kg (230 lb 12.8 oz)   07/30/22 (!) 105.9 kg (233 lb 8 oz)   04/28/22 (!) 108.4 kg (239 lb)   04/14/22 (!) 109.9 kg (242 lb 3.2 oz)   02/24/22 (!) 110.5 kg (243 lb 8 oz)   02/13/22 (!) 109.3 kg (241 lb)   02/06/22 (!) 108 kg (238 lb)   02/05/22 (!) 108.9 kg (240 lb)     BP Readings from Last 5 Encounters:   10/29/22 134/78   10/07/22 132/82   10/07/22 138/82   09/08/22 120/76   08/14/22 124/74     Pulse Readings from Last 3 Encounters:   10/29/22 71   10/07/22 74   10/07/22 74       General:  Alert, no distress.   Eyes:  EOMI, sclerae anicteric   Neck: No carotid bruit. JVP is not visible sitting upright   Resp:   CTAB bilaterally with normal WOB.   CV:  RRR, normal s1 and s2, no murmurs, normal radial pulses b/l, mild LE  edema   GI:   Abdomen soft, non-tender   MSK: Normal muscle tone, no joint swelling/effusion   Skin: No lesions/rashes.   Neuro: No focal deficits.   Psych: Normal mood, normal affect     Lab Results   Component Value Date    HGB 13.6 10/08/2022    HGB 12.9 04/28/2022    HGB 12.0 (L) 02/13/2022    PLT 263 10/08/2022    PLT 303 04/28/2022    PLT 362 02/13/2022     Lab Results   Component Value Date    CREATININE 1.20 10/08/2022    CREATININE 1.10 04/28/2022    CREATININE 1.40 (H) 02/13/2022    K 4.3 10/08/2022    K 4.8 04/28/2022    K 4.9 02/13/2022      Lab Results   Component Value Date    BNP 408 (H) 03/18/2020     Lab Results   Component Value Date    PROBNP 541.0 (H) 01/16/2022    PROBNP 281.0 04/05/2021 PROBNP 3,890.0 (H) 02/26/2021     Lab Results   Component Value Date    CHOL 105 10/08/2022    CHOL 126 04/28/2022    LDL 24 (L) 10/08/2022    LDL 33 (L) 04/28/2022    HDL 52 10/08/2022    HDL 44 04/28/2022    TRIG 086 10/08/2022    TRIG 244 (H) 04/28/2022     Lab Results   Component Value Date    A1C 6.8 (A) 07/30/2022

## 2022-11-04 NOTE — Unmapped (Signed)
The [facility] has made a second and final attempt to reach this patient to refill the following medication:Repatha.      We have left voicemails on the following phone numbers: [phone] and have sent a Mychart questionnaire..    Dates contacted: 10/24/22, 11/03/22  Last scheduled delivery: 10/08/22    The patient may be at risk of non-compliance with this medication. The patient should call the [facility] at [phone]  Option 4, then Option 5: Cardiology, Endocrinology to refill medication.    [name]   [facility] Specialty Technician

## 2022-11-04 NOTE — Unmapped (Signed)
-----   Message from Barron Alvine, MD sent at 11/04/2022  2:39 PM EDT -----  Regarding: FW: Pt Advice  I recommend phone visit for stable and no worse symptoms.  If symptoms worse then in office visit recommended.    Barron Alvine, MD  [facility] at [facility]  [address]  [place], [name] [address]  Phone [phone]  ----- Message -----  From: Ruthy Dick, RN  Sent: 11/04/2022   2:30 PM EDT  To: [name], MD  Subject: Pt Advice                                        Spoke with pt, pt reports rash is not improving, genital area is still red, itchy, and sore. The prescribed cream is not improving symptoms and pt reports that the rash/infection is coming back.  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 Please call her back @ [phone].

## 2022-11-04 NOTE — Unmapped (Signed)
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 Please call her back @ [phone].

## 2022-11-04 NOTE — Unmapped (Signed)
Spoke with pt, pt reports rash is not improving, genital area is still red, itchy, and sore. The prescribed cream is not improving symptoms and pt reports that the rash/infection is coming back. Sending message to Dr. Hollice Espy for further advice and recommendations.

## 2022-11-07 NOTE — Unmapped (Signed)
Entered in error. Duplicate made

## 2022-11-10 ENCOUNTER — Ambulatory Visit: Admit: 2022-11-10 | Discharge: 2022-11-11 | Payer: MEDICARE | Attending: Family Medicine | Primary: Family Medicine

## 2022-11-10 DIAGNOSIS — L304 Erythema intertrigo: Principal | ICD-10-CM

## 2022-11-10 DIAGNOSIS — L299 Pruritus, unspecified: Principal | ICD-10-CM

## 2022-11-10 DIAGNOSIS — N1832 Type 2 diabetes mellitus with stage 3b chronic kidney disease, without long-term current use of insulin (CMS-HCC): Principal | ICD-10-CM

## 2022-11-10 DIAGNOSIS — E1122 Type 2 diabetes mellitus with diabetic chronic kidney disease: Principal | ICD-10-CM

## 2022-11-10 MED ORDER — PRAMOXINE 1 % TOPICAL CREAM
0 refills | 0 days | Status: CP
Start: 2022-11-10 — End: ?

## 2022-11-10 MED ORDER — NYSTATIN 100,000 UNIT/GRAM TOPICAL CREAM
Freq: Three times a day (TID) | TOPICAL | 0 refills | 0 days | Status: CP
Start: 2022-11-10 — End: 2023-11-10

## 2022-11-10 MED ORDER — FLUCONAZOLE 150 MG TABLET
ORAL_TABLET | Freq: Once | ORAL | 0 refills | 1 days | Status: CP
Start: 2022-11-10 — End: 2022-11-10

## 2022-11-10 NOTE — Unmapped (Signed)
Health Maintenance Due   Topic Date Due    Zoster Vaccines (1 of 2) Never done    DTaP/Tdap/Td Vaccines (1 - Tdap) 04/08/2002    COVID-19 Vaccine (1 - 2023-24 season) Never done    Retinal Eye Exam  12/13/2021           Goals & Recommendations:    Goals and Recommendations related to your health are outlined below.    If discussed and provided at your visit today: Please, keep and record your blood sugar, blood pressure, weight, fluid intake, dietary intake and exercise amount and frequency.     Blood Pressure Goals (unless otherwise instructed):  Check 2-3 times a week.  Typical average out of office measurement: <135 mmHg systolic and < 85 mmHg diastolic (less than 135/85).  More strict recommendations for out of office measurement: < 130 mmHg systolic and < 80 mmHg diastolic (less than 130/80).  Typical in office measurement <140 mmHg systolic and <90 mmHg diastolic (less than 140/90).  Most people will not feel well if blood pressure drops to less than 110/70, unless they typically have blood pressures in this range.   If you are noticing sudden increases or decreases in blood pressure, then please contact the office.    Diabetes: Goal Hemoglobin A1c 7.9 or lower.  Goal Blood Sugars Fasting: 60-140 (may check fasting and two hours after a meal later in the day).    For Monitoring your Mood your PHQ-9 Goal is less than 10.    Drink 48 ounces of water per day.  Avoid sweetened beverages.  You will generally feel better and experience improved health when you limit, and if possible avoid, processed foods.  Diets high in plant based foods, vegetables and fruits are associated with many health benefits.    Exercise for a minimum of 20 minutes daily. Minimum of four days a week.   If possible try to obtain 60 minutes of exercise daily for five or more days per week.  Regular movement throughout the day is best.   Walking is a good form of movement.   Squats, lunges, step-ups, push-ups, planks, and arm exercises with or without weights or resistance bands are good forms of exercise.    If your BMI (body mass index) is above 25 then your body is carrying more weight than is typically healthy.  If your BMI (body mass index) is above 25 and you are not achieving the healthy weight goals that you desire, then consider the following:  Keep calories below 2500 calories per day unless otherwise instructed. Remember that all calories are not equal.  Keep a food journal and record accurately.  Read labels and ingredient lists on the products that you consume.  Sometimes we need to adjust your level of physical activity, and or tailor an exercise program more specifically for your needs, just ask and we will come up with a plan.  If you have questions about any of these recommendations, please ask.    If you smoke, stop.  If you have heart failure or severe kidney disease: weigh yourself daily and maintain fluid intake to less than 2 Liters daily unless otherwise instructed.  If you have diabetes you need to see the eye doctor at least every one to two years and have a yearly foot exam.  Set aside time daily for relaxation.  Plan a pleasant activity that you can look forward to and enjoy.  Medication changes are outlined on your After Visit Summary.  Any referrals placed today will be outlined on your After Visit Summary.    If you have questions, please call the office or use MyChart.   If you need a refill on a medication, please contact your pharmacy.  Please, allow at least two business days for responses to refill requests.    Please, bring all medications and when possible original prescription containers to each and every office visit.    If you have been provided and / or requested to record your blood sugars, blood pressures, weight, fluid intake, dietary intake, or exercise, then please bring records to each and every appointment.    When you see other providers for health care, please request that they send information and records related to your visit to our office. This includes vaccinations, mammograms, radiology, procedures, diagnostic testing, lab testing and physical exams or wellness exams that you have outside of the [facility].      [facility]  [address], [address]  [place], [place] [address]  Office: [phone]  Fax: [phone]               If a lab, test, imaging order or referral was placed today it will show on your AVS.    Labs:  Please obtain labs today prior to leaving, unless otherwise instructed.  If you have been asked to obtain labs at a later date, please schedule a Lab Visit appointment.    X-rays (plain films):    Plain film (traditional) x-rays do not require scheduling at [facility] or elsewhere in the [facility] system.  If you had an x-ray ordered today, our office recommends going to get the x-ray performed as soon as you can, unless otherwise instructed.  If you are obtaining X-rays (plain films) outside of the [facility] system, then please verify with your health care provider so that we can assist in coordinating any needed scheduling / appointments.    Referrals (referral for specialty consultation), Imaging (CT, MRI, Ultrasounds, Cardiac Testing, Other Imaging, Spirometry, Sleep Studies):  If there is a number on your AVS today for a location to call, then calling this number is recommended.  If there is not a number to call, then you will be contacted via your listed preferred phone number, text and or MyChart.  If you are signed up for [facility], then some information may be directed to your MyChart account as the first line of contact. For example, [facility] may contact you to schedule an appointment for a referral or test first via your MyChart account. If you have signed up previously for a [facility] MyChart account, though you do not currently access or know how to access, then please let a member of our office staff know. If you would like to set up a [facility] MyChart account please see the instructions near the end of your After Visit Summary. If you need assistance with [facility], you may call the [facility] at [phone].   Phone calls to schedule through [facility] may come from [phone] or [phone] area codes.  If you have not heard regarding scheduling of your referral in the next 7 days, then please contact our office at [phone] option 5.    Insurance Specific Considerations:  Please review information from your insurance provider regarding scheduling and coverage for specialty care, procedures and imaging.  If you know that your insurance specifically requires a referral  or authorization prior to scheduling with a specialty care provider or for a test, please contact our office prior to scheduling at [phone] option 5.    Forms:  We are happy to assist with any forms you may need. We typically complete most forms within 7 business days.  For all forms that you need our office to complete, please review the forms in their entirety first, complete any areas that are required by you including any necessary signatures prior to leaving the forms with our office. Please note that many forms require signatures from patients related to consent for a health care provider or office to complete the form, where required this if often outlined on the forms that you would be submitting. Please also note the time frames for requested return of forms prior to providing to our office as well as any supporting documentation that you may need to provide. The most common reason for delayed completion of forms or return of forms to patients or submissions elsewhere are forms which are not signed by the patient where needed or forms requested for which we do not have supporting documentation or where additional information is needed prior to our completion of the requested forms. Please also note that there are instances beyond our control where forms may not be available within 7 business days.    Refills:  Please allow 3 business days for any refill requests to be processed. Please contact your pharmacy for any refills and to check the status on any refill request. In some instances your insurance may require a prior approval for certain medications. In those cases, refills are processed after we receive approval and this could possibly lengthen the turnaround time.    No Show and Late arrivals:  Please call the office when you need to cancel or reschedule an appointment, not doing so places the appointment as a no-show. If you have 3 or more no shows in a year, you may be asked to locate another provider outside of our office. If you arrive 15 minutes after your appointment time, you may be asked to reschedule.    Phone:  Our phone calls are answered Monday-Friday 8:00 am to 5:00 pm, by a staff member. Calls outside of these times and on weekends, will be answered by a registered nurse with [facility]. If needed, they will reach out to providers on call. Refills are not processed after normal business hours.     Coordination of Care:  When you see other providers for health care, please request that they send information and records related to your visit to our office. This includes vaccinations, mammograms, radiology, procedures, diagnostic testing, lab testing and physical exams or wellness exams that you have outside of the [facility] system. Our contact information is outlined below. Please periodically review your preferred contact information, preferred methods of contact and individuals listed on your contact list in our records. We recommend when possible having at least two phone numbers available for contact. If you have a MyChart account, please access and review your account periodically to update information and check for any new information that may have been sent to you.     Planning for your next visit:  Please bring any needed information to update your insurance, payment records, and preferred contact information.  Updated Medication List including supplements and over the counter medications.  Any information related to health care visits elsewhere since your last visit, including: urgent cares, emergency departments, specialist offices, or  other imaging or procedures.    We Acknowledge:  We acknowledge that while technology has made great progress in allowing [place] to communicate more and has improved some of the connectivity of health care and health care providers, these systems are not perfect. If any questions exist please always ask and if anything is unclear please let us know. In regards to tests, labs, imaging and recommendations for referrals or other procedures from our office, you should always hear something as far as an appointment, a result, a recommendation or a next step.    Billing:  For questions regarding billing please call [phone] option #4.  If you are signed up for [facility], then all billing statements will come to you directly by way of your [facility] MyChart account. Beginning in September 2023, if you are signed up for [facility], you will not receive a mailed paper billing statement from [facility]. If you have signed up previously for a [facility] MyChart account, though you do not currently access or know how to access, then please let a member of our office staff know. If you would like to set up a [facility] MyChart account please see the instructions near the end of your After Visit Summary. If you need assistance with your [facility], you may call the [facility] at [phone].     Inclement Weather:  During times of inclement weather please call the office to check on any changes in hours of operation.    Survey:  Thank you for the chance to participate in your health care.  Soon you should be receiving a survey about your recent visit to [facility] [place].  Please take a moment to complete this survey.  We take your feedback very seriously and will use it to guide our continuous improvement efforts.     [facility]  [address], [address]  [place], [place] [address]  Office: [phone]  Fax: [phone]           Learning About Mindfulness for Stress  What are mindfulness and stress?    Stress is what you feel when you have to handle more than you are used to. A lot of things can cause stress. You may feel stress when you go on a job interview, take a test, or run a race. This kind of short-term stress is normal and even useful. It can help you if you need to work hard or react quickly.  Stress also can last a long time. Long-term stress is caused by stressful situations or events. Examples of long-term stress include long-term health problems, ongoing problems at work, and conflicts in your family. Long-term stress can harm your health.  Mindfulness is a focus only on things happening in the present moment. It's a process of purposefully paying attention to and being aware of your surroundings, your emotions, your thoughts, and how your body feels. You are aware of these things, but you aren't judging these experiences as good or bad. Mindfulness can help you learn to calm your mind and body to help you cope with illness, pain, and stress.  How does mindfulness help to relieve stress?  Mindfulness can help quiet your mind and relax your body. Studies show that it can help some people sleep better, feel less anxious, and bring their blood pressure down. And it's been shown to help some people live and cope better with certain health problems  like heart disease, depression, chronic pain, and cancer.  How do you practice mindfulness?  To be mindful is to pay attention, to be present, and to be accepting.  When you're mindful, you do just one thing and you pay close attention to that one thing. For example, you may sit quietly and notice your emotions or how your food tastes and smells.  When you're present, you focus on the things that are happening right now. You let go of your thoughts about the past and the future. When you dwell on the past or the future, you miss moments that can heal and strengthen you. You may miss moments like hearing a child laugh or seeing a friendly face when you think you're all alone.  When you're accepting, you don't judge the present moment. Instead you accept your thoughts and feelings as they come.  You can practice anytime, anywhere, and in any way you choose. You can practice in many ways. Here are a few ideas:  While doing your chores, like washing the dishes, let your mind focus on what's in your hand. What does the dish feel like? Is the water warm or cold?  Go outside and take a few deep breaths. What is the air like? Is it warm or cold?  When you can, take some time at the start of your day to sit alone and think.  Take a slow walk by yourself. Count your steps while you breathe in and out.  Try yoga breathing exercises, stretches, and poses to strengthen and relax your muscles.  At work, if you can, try to stop for a few moments each hour. Note how your body feels. Let yourself regroup and let your mind settle before you return to what you were doing.  If you struggle with anxiety or worry thoughts, imagine your mind as a blue sky and your worry thoughts as clouds. Now imagine those worry thoughts floating across your mind's sky. Just let them pass by as you watch.  Follow-up care is a key part of your treatment and safety. Be sure to make and go to all appointments, and call your doctor if you are having problems. It's also a good idea to know your test results and keep a list of the medicines you take.  Where can you learn more?  Go to [facility] at [email or web address]  Select Preferences in the upper right hand corner, then select Health Library under Resources. Enter [id number] in the search box to learn more about Learning About Mindfulness for Stress.  Current as of: January 15, 2016  Content Version: 11.6  ?? 2006-2018 [employer]. Care instructions adapted under license by [facility]. If you have questions about a medical condition or this instruction, always ask your healthcare professional. [employer], Inc[employer] disclaims any warranty or liability for your use of this information.         Learning About Guided Imagery for Stress  What are guided imagery and stress?    Stress is what you feel when you have to handle more than you are used to. A lot of things can cause stress. You may feel stress when you go on a job interview, take a test, or run a race. This kind of short-term stress is normal and even useful. It can help you if you need to work hard or react quickly.  Stress also can last a long time. Long-term stress is caused by stressful situations or events. Examples of long-term stress include long-term  health problems, ongoing problems at work, and conflicts in your family. Long-term stress can harm your health.  Guided imagery is a technique of directed thoughts and suggestions that guide your mind toward a relaxed, focused state. This technique helps you use your mind to take you to a calm, peaceful place. You can use it to relax, which can lower blood pressure and reduce other problems related to stress.  How does guided imagery help to relieve stress?  Because of the way the mind and body are connected, guided imagery can make you feel like you are experiencing something just by imagining it. You can achieve a relaxed state when you imagine all the details of a safe, comfortable place, such as a beach or a garden. This relaxed state may help you feel more in control of your emotions and thought processes. Feeling in control may improve your attitude, health, and sense of well-being.  How do you do guided imagery?  You can use a smartphone app or a video to lead you through the process. You use all of your senses in guided imagery. For example, if you want a tropical setting, you can imagine the warm breeze on your skin, the bright blue of the water, the sound of the surf, the sweet scent of tropical flowers, and the taste of coconut. Imagining those things can make you actually feel like you're there.  But you don't have to imagine the tropics to feel peace. Guided imagery can take you to your own restful place. To give guided imagery a try, follow these steps:  Lean back comfortably in your chair. If you can, close your eyes. Put your arms on the armrests, or fold your hands in your lap.  Take a deep breath through your nose. Breathe in slowly, and then let the air out completely through your mouth.  Do it again slowly. Keep breathing like this. Gather up any tension in your body, and send it out with every breath.  Let a feeling of warmth spread from your lungs to your neck and head, down your arms to your fingertips, through your body and into your legs, all the way down to your toes. Stay this way for a moment.  Now imagine a pleasant day. You're at a park or at a place you've visited in the past where you felt at peace.  In your mind's eye, look at what lies in front of you. Maybe you see the sun, filtered through trees. Maybe clouds are drifting by.  Look to one side, and then the other. Notice the feel of the air around you. Notice how it feels on your face and on your arms.  Stay here for a while. Let it become real for you.  Follow-up care is a key part of your treatment and safety. Be sure to make and go to all appointments, and call your doctor if you are having problems. It's also a good idea to know your test results and keep a list of the medicines you take.  Where can you learn more?  Go to [facility] at [email or web address]  Select Preferences in the upper right hand corner, then select Health Library under Resources. Enter N291 in the search box to learn more about Learning About Guided Imagery for Stress.  Current as of: January 15, 2016  Content Version: 11.6  ?? 2006-2018 [employer]. Care instructions adapted under license by [facility]. If you have questions about a medical condition or this instruction, always  ask your healthcare professional. Health[employer], Incorporated disclaims any warranty or liability for your use of this information.        Learning About the Mediterranean Diet  What is the Mediterranean diet?    The Mediterranean diet is a style of eating rather than a diet plan. It features foods eaten in [place], [place], southern [place] and [place], and other countries along the [employer]. It emphasizes eating foods like fish, fruits, vegetables, beans, high-fiber breads and whole grains, nuts, and olive oil. This style of eating includes limited red meat, cheese, and sweets.  Why choose the Mediterranean diet?  A Mediterranean-style diet may improve heart health. It contains more fat than other heart-healthy diets. But the fats are mainly from nuts, unsaturated oils (such as fish oils and olive oil), and certain nut or seed oils (such as canola, soybean, or flaxseed oil). These fats may help protect the heart and blood vessels.  How can you get started on the Mediterranean diet?  Here are some things you can do to switch to a more Mediterranean way of eating.  What to eat  Eat a variety of fruits and vegetables each day, such as grapes, blueberries, tomatoes, broccoli, peppers, figs, olives, spinach, eggplant, beans, lentils, and chickpeas.  Eat a variety of whole-grain foods each day, such as oats, brown rice, and whole wheat bread, pasta, and couscous.  Eat fish at least 2 times a week. Try tuna, salmon, mackerel, lake trout, herring, or sardines.  Eat moderate amounts of low-fat dairy products, such as milk, cheese, or yogurt.  Eat moderate amounts of poultry and eggs.  Choose healthy (unsaturated) fats, such as nuts, olive oil, and certain nut or seed oils like canola, soybean, and flaxseed.  Limit unhealthy (saturated) fats, such as butter, palm oil, and coconut oil. And limit fats found in animal products, such as meat and dairy products made with whole milk. Try to eat red meat only a few times a month in very small amounts.  Limit sweets and desserts to only a few times a week. This includes sugar-sweetened drinks like soda.  The Mediterranean diet may also include red wine with your meal--1 glass each day for women and up to 2 glasses a day for men.  Tips for eating at home  Use herbs, spices, garlic, lemon zest, and citrus juice instead of salt to add flavor to foods.  Add avocado slices to your sandwich instead of bacon.  Have fish for lunch or dinner instead of red meat. Brush the fish with olive oil, and broil or grill it.  Sprinkle your salad with seeds or nuts instead of cheese.  Cook with olive or canola oil instead of butter or oils that are high in saturated fat.  Switch from 2% milk or whole milk to 1% or fat-free milk.  Dip raw vegetables in a vinaigrette dressing or hummus instead of dips made from mayonnaise or sour cream.  Have a piece of fruit for dessert instead of a piece of cake. Try baked apples, or have some dried fruit.  Tips for eating out  Try broiled, grilled, baked, or poached fish instead of having it fried or breaded.  Ask your server to have your meals prepared with olive oil instead of butter.  Order dishes made with marinara sauce or sauces made from olive oil. Avoid sauces made from cream or mayonnaise.  Choose whole-grain breads, whole wheat pasta and pizza crust, brown rice, beans, and lentils.  Cut back on butter  or margarine on bread. Instead, you can dip your bread in a small amount of olive oil.  Ask for a side salad or grilled vegetables instead of french fries or chips.  Where can you learn more?  Go to [facility] at [email or web address]  [facility] Library under the Resources menu. Enter O407 in the search box to learn more about Learning About the Mediterranean Diet.  Current as of: February 11, 2017  Content Version: 12.2  ?? 2006-2019 [employer]. Care instructions adapted under license by [facility]. If you have questions about a medical condition or this instruction, always ask your healthcare professional. [employer] disclaims any warranty or liability for your use of this information.            Stretching: Exercises  Your Care Instructions  Here are some examples of exercises for stretching. Start each exercise slowly. Ease off the exercise if you start to have pain.  Your doctor or physical therapist will tell you when you can start these exercises and which ones will work best for you.  How to do the exercises  Latissimus stretch    Stand with your back straight and your feet shoulder-width apart. You can do this stretch sitting down if you are not steady on your feet.  Hold your arms above your head, and hold one hand with the other.  Pull upward while leaning straight over toward your right side. Keep your lower body straight. You should feel the stretch along your left side.  Hold 15 to 30 seconds, and then switch sides.  Repeat 2 to 4 times for each side.  Triceps stretch    Stand with your back straight and your feet shoulder-width apart. You can do this stretch sitting down if you are not steady on your feet.  Bring your left elbow straight up while bending your arm.  Grab your left elbow with your right hand, and pull your left elbow toward your head with light pressure. If you are more flexible, you may pull your arm slightly behind your head. You will feel the stretch along the back of your arm.  Hold 15 to 30 seconds, and then switch elbows.  Repeat 2 to 4 times for each arm.  Calf stretch    Place your hands on a wall for balance. You can also do this with your hands on the back of a chair, a countertop, or a tree.  Step back with your left leg. Keep the leg straight, and press your left heel into the floor.  Press your hips forward, bending your right leg slightly. You will feel the stretch in your left calf.  Hold the stretch 15 to 30 seconds.  Repeat 2 to 4 times for each leg.  Quadriceps stretch    Lie on your side with one hand supporting your head.  Bend your upper leg back and grab your ankle with your other hand.  Stretch your leg back by pulling your foot toward your buttocks. You will feel the stretch in the front of your thigh. If this causes stress on your knees, do not do this stretch.  Hold the stretch 15 to 30 seconds.  Repeat 2 to 4 times for each leg.  Groin stretch    Sit on the floor and put the soles of your feet together. Do not slump your back.  Grab your ankles and gently pull your legs toward you.  Press your knees toward the floor. You will feel the  stretch in your inner thighs.  Hold 15 to 30 seconds.  Repeat 2 to 4 times.  Hamstring stretch in doorway    Lie on the floor near a doorway, with your buttocks close to the wall.  Let the leg you are not stretching extend through the doorway.  Put the leg you want to stretch up on the wall, and straighten your knee to feel a gentle stretch at the back of your leg.  Hold the stretch for at least 15 to 30 seconds. Repeat 2 to 4 times.  Follow-up care is a key part of your treatment and safety. Be sure to make and go to all appointments, and call your doctor if you are having problems. It's also a good idea to know your test results and keep a list of the medicines you take.  Where can you learn more?  Go to [facility] at [email or web address]  Select Preferences in the upper right hand corner, then select Health Library under Resources. Enter P733 in the search box to learn more about Stretching: Exercises.  Current as of: March 13, 2016  Content Version: 11.6  ?? 2006-2018 [employer]. Care instructions adapted under license by [facility]. If you have questions about a medical condition or this instruction, always ask your healthcare professional. [employer] disclaims any warranty or liability for your use of this information.

## 2022-11-10 NOTE — Unmapped (Signed)
Left several messages and attempted calls to schedule appointment for pt with Dr. Hollice Espy r/t note below and PCP recommendation. Appointment scheduled by message for pt on 8/5 regarding rash and fungal infection.

## 2022-11-14 NOTE — Unmapped (Signed)
Assessment/Plan     Diagnoses and all orders for this visit:    Type 2 diabetes mellitus with stage 3b chronic kidney disease, without long-term current use of insulin (CMS-HCC)  Stable overall. Well controlled overall. Continue current regimen.  -     Health Maintenance Outside Records Request; Future    Pruritus secondary to Intertrigo  Standard education and anticipatory guidance regarding goals, objectives, and sequelae.  Conservative measures reviewed.  -     pramoxine 1 % Crea; Apply up to three times daily. Limit to 7 days consistent use.  -     nystatin (MYCOSTATIN) 100,000 unit/gram cream; Apply topically Three (3) times a day.  -     fluconazole (DIFLUCAN) 150 MG tablet; Take 1 tablet (150 mg total) by mouth once for 1 dose. Hold Aricet (donepezil) for one day before and after taking oral fluconazole.  If not improving as expected then recommend consultation with dermatology.      Disposition: Return for Next scheduled follow up.  12/10/2022      Diagnoses and plan along with any newly prescribed medication(s), recommendations, orders and therapies were discussed in detail with patient today.   Standard education and anticipatory guidance regarding goals, objectives and potential sequelae were reviewed. Patient voiced appropriate understanding of assessment and plan of care.    Pertinent handouts were given today and reviewed with the patient as indicated.  The Care Plan and Self-Management goals have been included on the AVS and the AVS has been printed.  Where helpful for monitoring and management I have encouraged the patient to keep regular logs for me to review at their next visit. Any outside resources or referrals needed at this time are noted above. No additional referrals necessary at this time. Patient voiced understanding and all questions have been answered to satisfaction.     To complete this documentation in a timely manner, portions of this encounter may have been completed utilizing Dragon voice recognition software. Please take this into consideration in noting nuances of documentation, grammar, punctuation, and wording. If and when a question exists, or for clarification, please contact the author.    Barron Alvine, MD  [facility] at [facility]  [address], [address]  [place], [name] [address]  Phone [phone]    Subjective:     [name], Jesus Beam, MD    Jesus Rubio, is a 77 y.o. male    Chief Complaint   Patient presents with    Rash     Patient states that the rash is located in his groin area, states that the area is itchy, and red and hot to the touch. States that the ointment that was given has help some.       HPI:    Patient presents: Alone.    Nursing notes and history reviewed with patient.    Diabetes mellitus type 2.  Goal A1c less than 8.  No hypo or hyperglycemic symptoms.  Compliant with medication regimen denies side effects.     Stage III chronic kidney disease.  No urinary tract symptoms, shortness of breath, abdominal swelling, lower extremity edema, orthopnea, pnd.  Compliant with renal protection recommendations.    Pruritus of the inguinal and perineal region with erythema.  Treated with topical pramoxine and clotrimazole previously though does not always consistently use.  Denies new additional symptoms of concern.  No other rash elsewhere.    Memory is stable.     History of anemia.  No  evidence of ongoing concerns related to blood loss.    No other complaints.  No additional recent health care visits elsewhere. No urgent care or ED visits.  No additional palliative, provocative, modifying, quantifying, qualifying, or other related factors.  Unless otherwise outlined above patient is pleased with current medication regimen, admits compliance and denies side effects of medication.    The following portions of the patient's history were reviewed and updated as appropriate: allergies, current medications, past family history, past medical history, past social history, past surgical history and problem list.    ROS: Pertinent positives and negatives are as outlined in the HPI above. The remaining balance of the 12 point review of systems is otherwise benign.      PCMH Components:      Goals         <130/80       Lack of exercise or physical activity    Increase exercise or physical activity        Get heart rate stabilized (pt-stated)         Things to think about to help me reach my goal:     What are you going to do? Take medications as prescribed, Keep positive thinking, Pace activities, Stay hydrated   How and how much? Daily   How frequent? Daily   Barriers to success?    Solutions to barriers?      06/04/17: Patient continues to work on this goal   09/29/17 Patient continues to work on this goal   12-09-2017: Reports he continues to work on this and is staying positive.        Take actions to prevent falling       12-03-2016: New goal: Will change positions slowly and pay attention to pathways. Uses cane or walker at times.   09/29/17 Patient continues to work on this goal   12-09-2017: Reports 1 fall 6-8 months ago when he missed a step and fell. Denies injury. Uses cane occasionally.        Weight < 90.7 kg (200 lb)       October 07, 2022 11:24 AM AWV Goal   Things to think about to help me reach my goal:     What are you going to do? Lose weight   How and how much? 20 lbs   How frequent? daily   Barriers to success? None identified   Solutions to barriers? N/a                     Family characteristics, patient's social characteristics, patient's cultural characteristics, patient's communication needs, patient's health literacy and behaviors affecting patient's health are outlined under the patient's History Section, Longitudinal Care Plan or Problem List in Epic or as pertinent in History of Present Illness.    Barriers to goals identified and addressed.   See Assessment and Plan for additional pertinent details.    Objective:     Physical Exam   BP 128/70  - Pulse 70  - Temp 36.8 ??C (98.3 ??F) (Oral)  - Resp 18  - Ht 182.9 cm (6' 0.01)  - Wt 97.8 kg (215 lb 8 oz)  - SpO2 97%  - BMI 29.22 kg/m??   Constitutional: Oriented to person, place, and time. Appears well-developed and well-nourished. No distress.   Head: Normocephalic and atraumatic.   Mouth/Throat: Oropharynx is without mass or concerning lesions. Moist mucous membranes.    Eyes: Conjunctivae are normal. No scleral icterus.  Neck: Neck supple. Normal range of motion. No JVD present. No thyromegaly present. No concerning neck mass.   Cardiovascular: Normal rate. Regular rhythm. Normal heart sounds. No murmur heard.  Pulmonary/Chest: Effort normal. No respiratory distress. Breath sounds normal. No wheezes. No rhonchi. No rales. No focal breath sound changes. No chest tenderness.  Abdominal: Soft. Non-distended. No abdominal mass. No tenderness. No rebound. No guarding.   GU: Normal male phallus. Two symmetric scrotal testes. No masses. No hernias. Erythematous rash bilateral inguinal region consistent with intertrigo.  Musculoskeletal: No active synovitis. No concerning joint erythema, warmth or swelling.   Range of motion is without evidence of significant impairment.   Strength is without evidence of significant impairment.   No edema of the extremities.   Lymphadenopathy: There is no lymphadenopathy about the head or neck.  Neurological: Alert and oriented to person, place, and time. No cranial nerve deficit. Normal muscle tone. Normal coordination.  Skin: Skin is warm and dry. No pallor.  Erythematous rash bilateral inguinal region consistent with intertrigo.  Psychiatric: Normal mood, affect and behavior. Judgment and thought content normal.

## 2022-11-17 NOTE — Unmapped (Signed)
[facility] Refill Coordination Note    Specialty Lite Medication(s) to be Shipped:   Repatha    Other medication(s) to be shipped: No additional medications requested for fill at this time     Jesus Rubio, DOB: 05-16-1945  Phone: There are no phone numbers on file.      All above HIPAA information was verified with patient's family member, [name].     Was a Nurse, learning disability used for this call? No    Changes to medications: [name] reports no changes at this time.  Changes to insurance: No      REFERRAL TO PHARMACIST     Referral to the pharmacist: Not needed      [facility]     Shipping address confirmed in Epic.     Delivery Scheduled: Yes, Expected medication delivery date: 11/25/22.     Medication will be delivered via Same Day Courier to the prescription address in Epic WAM.    [name]   [facility] Specialty Technician

## 2022-11-24 DIAGNOSIS — G4700 Insomnia, unspecified: Principal | ICD-10-CM

## 2022-11-24 MED ORDER — METOPROLOL SUCCINATE ER 100 MG TABLET,EXTENDED RELEASE 24 HR
ORAL_TABLET | 1 refills | 0 days | Status: CP
Start: 2022-11-24 — End: ?

## 2022-11-24 MED ORDER — TRAZODONE 50 MG TABLET
ORAL_TABLET | 1 refills | 0 days | Status: CP
Start: 2022-11-24 — End: ?

## 2022-11-25 MED FILL — REPATHA SURECLICK 140 MG/ML SUBCUTANEOUS PEN INJECTOR: SUBCUTANEOUS | 28 days supply | Qty: 2 | Fill #5

## 2022-12-03 DIAGNOSIS — R21 Rash and other nonspecific skin eruption: Principal | ICD-10-CM

## 2022-12-03 DIAGNOSIS — R413 Other amnesia: Principal | ICD-10-CM

## 2022-12-03 MED ORDER — DONEPEZIL 5 MG TABLET
ORAL_TABLET | Freq: Every evening | ORAL | 1 refills | 90 days | Status: CP
Start: 2022-12-03 — End: ?

## 2022-12-03 NOTE — Unmapped (Signed)
Wife reports ongoing rash and itching perineal region and gluteal cleft.    Referral placed to Dermatology.    Barron Alvine, MD  [facility]  [address], [address]  [place], [name] [address]  Phone [phone]

## 2022-12-03 NOTE — Unmapped (Signed)
Patient is requesting the following refill  Requested Prescriptions     Pending Prescriptions Disp Refills    donepezil (ARICEPT) 5 MG tablet [Pharmacy Med Name: DONEPEZIL HCL 5 MG TABLET] 90 tablet 0     Sig: TAKE 1 TABLET BY MOUTH EVERY DAY AT NIGHT       Recent Visits  Date Type Provider Dept   11/10/22 Office Visit [name], MD [place] [facility]   10/07/22 Office Visit Semerzier, Thalia Party, RN [place] [facility]   10/07/22 Office Visit [name], MD [place] [facility]   09/08/22 Office Visit [name], MD [place] [facility]   07/30/22 Office Visit [name], MD [place] [facility]   04/28/22 Office Visit [name], MD [place] [facility]   02/24/22 Office Visit [name], MD [place] [facility]   02/13/22 Office Visit [name], MD [place] [facility]   01/16/22 Office Visit [name], MD [place] [facility]   12/19/21 Office Visit [name], MD [place] [facility]   Showing recent visits within past 365 days and meeting all other requirements  Future Appointments  Date Type Provider Dept   12/10/22 Appointment [name], MD [place] [facility]   Showing future appointments within next 365 days and meeting all other requirements       Labs: Not applicable this refill

## 2022-12-10 ENCOUNTER — Ambulatory Visit: Admit: 2022-12-10 | Discharge: 2022-12-11 | Payer: MEDICARE | Attending: Family Medicine | Primary: Family Medicine

## 2022-12-10 DIAGNOSIS — Z862 Personal history of diseases of the blood and blood-forming organs and certain disorders involving the immune mechanism: Principal | ICD-10-CM

## 2022-12-10 DIAGNOSIS — R32 Unspecified urinary incontinence: Principal | ICD-10-CM

## 2022-12-10 DIAGNOSIS — N1831 Stage 3a chronic kidney disease (CMS-HCC): Principal | ICD-10-CM

## 2022-12-10 DIAGNOSIS — R413 Other amnesia: Principal | ICD-10-CM

## 2022-12-10 DIAGNOSIS — E1122 Type 2 diabetes mellitus with diabetic chronic kidney disease: Principal | ICD-10-CM

## 2022-12-10 DIAGNOSIS — I2510 Atherosclerotic heart disease of native coronary artery without angina pectoris: Principal | ICD-10-CM

## 2022-12-10 DIAGNOSIS — L299 Pruritus, unspecified: Principal | ICD-10-CM

## 2022-12-10 DIAGNOSIS — G629 Polyneuropathy, unspecified: Principal | ICD-10-CM

## 2022-12-10 DIAGNOSIS — N1832 Type 2 diabetes mellitus with stage 3b chronic kidney disease, without long-term current use of insulin (CMS-HCC): Principal | ICD-10-CM

## 2022-12-10 DIAGNOSIS — Z8673 Personal history of transient ischemic attack (TIA), and cerebral infarction without residual deficits: Principal | ICD-10-CM

## 2022-12-10 NOTE — Unmapped (Signed)
Health Maintenance Due   Topic Date Due    Zoster Vaccines (1 of 2) Never done    DTaP/Tdap/Td Vaccines (1 - Tdap) 04/08/2002    Retinal Eye Exam  12/13/2021    COVID-19 Vaccine (1 - 2023-24 season) Never done    Influenza Vaccine (1) 12/07/2022           Goals & Recommendations:    Goals and Recommendations related to your health are outlined below.    If discussed and provided at your visit today: Please, keep and record your blood sugar, blood pressure, weight, fluid intake, dietary intake and exercise amount and frequency.     Blood Pressure Goals (unless otherwise instructed):  Check 2-3 times a week.  Typical average out of office measurement: <135 mmHg systolic and < 85 mmHg diastolic (less than 135/85).  More strict recommendations for out of office measurement: < 130 mmHg systolic and < 80 mmHg diastolic (less than 130/80).  Typical in office measurement <140 mmHg systolic and <90 mmHg diastolic (less than 140/90).  Most people will not feel well if blood pressure drops to less than 110/70, unless they typically have blood pressures in this range.   If you are noticing sudden increases or decreases in blood pressure, then please contact the office.    Diabetes: Goal Hemoglobin A1c 7.9 or lower.  Goal Blood Sugars Fasting: 60-140 (may check fasting and two hours after a meal later in the day).    For Monitoring your Mood your PHQ-9 Goal is less than 10.    Drink 48 ounces of water per day.  Avoid sweetened beverages.  You will generally feel better and experience improved health when you limit, and if possible avoid, processed foods.  Diets high in plant based foods, vegetables and fruits are associated with many health benefits.    Exercise for a minimum of 20 minutes daily. Minimum of four days a week.   If possible try to obtain 60 minutes of exercise daily for five or more days per week.  Regular movement throughout the day is best.   Walking is a good form of movement.   Squats, lunges, step-ups, push-ups, planks, and arm exercises with or without weights or resistance bands are good forms of exercise.    If your BMI (body mass index) is above 25 then your body is carrying more weight than is typically healthy.  If your BMI (body mass index) is above 25 and you are not achieving the healthy weight goals that you desire, then consider the following:  Keep calories below 2500 calories per day unless otherwise instructed. Remember that all calories are not equal.  Keep a food journal and record accurately.  Read labels and ingredient lists on the products that you consume.  Sometimes we need to adjust your level of physical activity, and or tailor an exercise program more specifically for your needs, just ask and we will come up with a plan.  If you have questions about any of these recommendations, please ask.    If you smoke, stop.  If you have heart failure or severe kidney disease: weigh yourself daily and maintain fluid intake to less than 2 Liters daily unless otherwise instructed.  If you have diabetes you need to see the eye doctor at least every one to two years and have a yearly foot exam.  Set aside time daily for relaxation.  Plan a pleasant activity that you can look forward to and enjoy.  Medication changes  are outlined on your After Visit Summary.  Any referrals placed today will be outlined on your After Visit Summary.    If you have questions, please call the office or use MyChart.   If you need a refill on a medication, please contact your pharmacy.  Please, allow at least two business days for responses to refill requests.    Please, bring all medications and when possible original prescription containers to each and every office visit.    If you have been provided and / or requested to record your blood sugars, blood pressures, weight, fluid intake, dietary intake, or exercise, then please bring records to each and every appointment.    When you see other providers for health care, please request that they send information and records related to your visit to our office. This includes vaccinations, mammograms, radiology, procedures, diagnostic testing, lab testing and physical exams or wellness exams that you have outside of the [facility] System.      [facility] at [facility]  [address]  [place], [place] [address]  Office: [phone]  Fax: [phone]               If a lab, test, imaging order or referral was placed today it will show on your AVS.    Labs:  Please obtain labs today prior to leaving, unless otherwise instructed.  If you have been asked to obtain labs at a later date, please schedule a Lab Visit appointment.    X-rays (plain films):    Plain film (traditional) x-rays do not require scheduling at [facility] or elsewhere in the [facility] system.  If you had an x-ray ordered today, our office recommends going to get the x-ray performed as soon as you can, unless otherwise instructed.  If you are obtaining X-rays (plain films) outside of the [facility] system, then please verify with your health care provider so that we can assist in coordinating any needed scheduling / appointments.    Referrals (referral for specialty consultation), Imaging (CT, MRI, Ultrasounds, Cardiac Testing, Other Imaging, Spirometry, Sleep Studies):  If there is a number on your AVS today for a location to call, then calling this number is recommended.  If there is not a number to call, then you will be contacted via your listed preferred phone number, text and or MyChart.  If you are signed up for [facility], then some information may be directed to your MyChart account as the first line of contact. For example, [facility] may contact you to schedule an appointment for a referral or test first via your MyChart account. If you have signed up previously for a [facility] MyChart account, though you do not currently access or know how to access, then please let a member of our office staff know. If you would like to set up a [facility] MyChart account please see the instructions near the end of your After Visit Summary. If you need assistance with [facility], you may call the [facility] at [phone].   Phone calls to schedule through [facility] may come from [phone] or [phone] area codes.  If you have not heard regarding scheduling of your referral in the next 7 days, then please contact our office at [phone] option 5.    Insurance Specific Considerations:  Please review information from your insurance provider regarding scheduling and coverage for specialty care, procedures and imaging.  If you  know that your insurance specifically requires a referral or authorization prior to scheduling with a specialty care provider or for a test, please contact our office prior to scheduling at [phone] option 5.    Forms:  We are happy to assist with any forms you may need. We typically complete most forms within 7 business days.  For all forms that you need our office to complete, please review the forms in their entirety first, complete any areas that are required by you including any necessary signatures prior to leaving the forms with our office. Please note that many forms require signatures from patients related to consent for a health care provider or office to complete the form, where required this if often outlined on the forms that you would be submitting. Please also note the time frames for requested return of forms prior to providing to our office as well as any supporting documentation that you may need to provide. The most common reason for delayed completion of forms or return of forms to patients or submissions elsewhere are forms which are not signed by the patient where needed or forms requested for which we do not have supporting documentation or where additional information is needed prior to our completion of the requested forms. Please also note that there are instances beyond our control where forms may not be available within 7 business days.    Refills:  Please allow 3 business days for any refill requests to be processed. Please contact your pharmacy for any refills and to check the status on any refill request. In some instances your insurance may require a prior approval for certain medications. In those cases, refills are processed after we receive approval and this could possibly lengthen the turnaround time.    No Show and Late arrivals:  Please call the office when you need to cancel or reschedule an appointment, not doing so places the appointment as a no-show. If you have 3 or more no shows in a year, you may be asked to locate another provider outside of our office. If you arrive 15 minutes after your appointment time, you may be asked to reschedule.    Phone:  Our phone calls are answered Monday-Friday 8:00 am to 5:00 pm, by a staff member. Calls outside of these times and on weekends, will be answered by a registered nurse with [facility]. If needed, they will reach out to providers on call. Refills are not processed after normal business hours.     Coordination of Care:  When you see other providers for health care, please request that they send information and records related to your visit to our office. This includes vaccinations, mammograms, radiology, procedures, diagnostic testing, lab testing and physical exams or wellness exams that you have outside of the [facility] system. Our contact information is outlined below. Please periodically review your preferred contact information, preferred methods of contact and individuals listed on your contact list in our records. We recommend when possible having at least two phone numbers available for contact. If you have a MyChart account, please access and review your account periodically to update information and check for any new information that may have been sent to you.     Planning for your next visit:  Please bring any needed information to update your insurance, payment records, and preferred contact information.  Updated Medication List including supplements and over the counter medications.  Any information related to health care visits elsewhere since your last visit,  including: urgent cares, emergency departments, specialist offices, or other imaging or procedures.    We Acknowledge:  We acknowledge that while technology has made great progress in allowing [place] to communicate more and has improved some of the connectivity of health care and health care providers, these systems are not perfect. If any questions exist please always ask and if anything is unclear please let us know. In regards to tests, labs, imaging and recommendations for referrals or other procedures from our office, you should always hear something as far as an appointment, a result, a recommendation or a next step.    Billing:  For questions regarding billing please call [phone] option #4.  If you are signed up for [facility], then all billing statements will come to you directly by way of your [facility] MyChart account. Beginning in September 2023, if you are signed up for [facility], you will not receive a mailed paper billing statement from [facility] [place] [facility]. If you have signed up previously for a [facility] MyChart account, though you do not currently access or know how to access, then please let a member of our office staff know. If you would like to set up a [facility] MyChart account please see the instructions near the end of your After Visit Summary. If you need assistance with your [facility], you may call the [facility] at [phone].     Inclement Weather:  During times of inclement weather please call the office to check on any changes in hours of operation.    Survey:  Thank you for the chance to participate in your health care.  Soon you should be receiving a survey about your recent visit to [facility].  Please take a moment to complete this survey.  We take your feedback very seriously and will use it to guide our continuous improvement efforts.     [facility] at [facility]  [address], [address]  [place], [place] [address]  Office: [phone]  Fax: [phone]           Learning About Mindfulness for Stress  What are mindfulness and stress?    Stress is what you feel when you have to handle more than you are used to. A lot of things can cause stress. You may feel stress when you go on a job interview, take a test, or run a race. This kind of short-term stress is normal and even useful. It can help you if you need to work hard or react quickly.  Stress also can last a long time. Long-term stress is caused by stressful situations or events. Examples of long-term stress include long-term health problems, ongoing problems at work, and conflicts in your family. Long-term stress can harm your health.  Mindfulness is a focus only on things happening in the present moment. It's a process of purposefully paying attention to and being aware of your surroundings, your emotions, your thoughts, and how your body feels. You are aware of these things, but you aren't judging these experiences as good or bad. Mindfulness can help you learn to calm your mind and body to help you cope with illness, pain, and stress.  How does mindfulness help to relieve stress?  Mindfulness can help quiet your mind and relax your body. Studies show that it can help some people sleep better, feel less anxious, and bring their blood pressure down. And it's been shown to help some people  live and cope better with certain health problems like heart disease, depression, chronic pain, and cancer.  How do you practice mindfulness?  To be mindful is to pay attention, to be present, and to be accepting.  When you're mindful, you do just one thing and you pay close attention to that one thing. For example, you may sit quietly and notice your emotions or how your food tastes and smells.  When you're present, you focus on the things that are happening right now. You let go of your thoughts about the past and the future. When you dwell on the past or the future, you miss moments that can heal and strengthen you. You may miss moments like hearing a child laugh or seeing a friendly face when you think you're all alone.  When you're accepting, you don't judge the present moment. Instead you accept your thoughts and feelings as they come.  You can practice anytime, anywhere, and in any way you choose. You can practice in many ways. Here are a few ideas:  While doing your chores, like washing the dishes, let your mind focus on what's in your hand. What does the dish feel like? Is the water warm or cold?  Go outside and take a few deep breaths. What is the air like? Is it warm or cold?  When you can, take some time at the start of your day to sit alone and think.  Take a slow walk by yourself. Count your steps while you breathe in and out.  Try yoga breathing exercises, stretches, and poses to strengthen and relax your muscles.  At work, if you can, try to stop for a few moments each hour. Note how your body feels. Let yourself regroup and let your mind settle before you return to what you were doing.  If you struggle with anxiety or worry thoughts, imagine your mind as a blue sky and your worry thoughts as clouds. Now imagine those worry thoughts floating across your mind's sky. Just let them pass by as you watch.  Follow-up care is a key part of your treatment and safety. Be sure to make and go to all appointments, and call your doctor if you are having problems. It's also a good idea to know your test results and keep a list of the medicines you take.  Where can you learn more?  Go to [facility] at [email or web address]  Select Preferences in the upper right hand corner, then select Health Library under Resources. Enter [phone] in the search box to learn more about Learning About Mindfulness for Stress.  Current as of: January 15, 2016  Content Version: 11.6  ?? 2006-2018 [employer]. Care instructions adapted under license by [facility]. If you have questions about a medical condition or this instruction, always ask your healthcare professional. [employer], Incorporated disclaims any warranty or liability for your use of this information.         Learning About Guided Imagery for Stress  What are guided imagery and stress?    Stress is what you feel when you have to handle more than you are used to. A lot of things can cause stress. You may feel stress when you go on a job interview, take a test, or run a race. This kind of short-term stress is normal and even useful. It can help you if you need to work hard or react quickly.  Stress also can last a long time. Long-term stress is caused by stressful situations  or events. Examples of long-term stress include long-term health problems, ongoing problems at work, and conflicts in your family. Long-term stress can harm your health.  Guided imagery is a technique of directed thoughts and suggestions that guide your mind toward a relaxed, focused state. This technique helps you use your mind to take you to a calm, peaceful place. You can use it to relax, which can lower blood pressure and reduce other problems related to stress.  How does guided imagery help to relieve stress?  Because of the way the mind and body are connected, guided imagery can make you feel like you are experiencing something just by imagining it. You can achieve a relaxed state when you imagine all the details of a safe, comfortable place, such as a beach or a garden. This relaxed state may help you feel more in control of your emotions and thought processes. Feeling in control may improve your attitude, health, and sense of well-being.  How do you do guided imagery?  You can use a smartphone app or a video to lead you through the process. You use all of your senses in guided imagery. For example, if you want a tropical setting, you can imagine the warm breeze on your skin, the bright blue of the water, the sound of the surf, the sweet scent of tropical flowers, and the taste of coconut. Imagining those things can make you actually feel like you're there.  But you don't have to imagine the tropics to feel peace. Guided imagery can take you to your own restful place. To give guided imagery a try, follow these steps:  Lean back comfortably in your chair. If you can, close your eyes. Put your arms on the armrests, or fold your hands in your lap.  Take a deep breath through your nose. Breathe in slowly, and then let the air out completely through your mouth.  Do it again slowly. Keep breathing like this. Gather up any tension in your body, and send it out with every breath.  Let a feeling of warmth spread from your lungs to your neck and head, down your arms to your fingertips, through your body and into your legs, all the way down to your toes. Stay this way for a moment.  Now imagine a pleasant day. You're at a park or at a place you've visited in the past where you felt at peace.  In your mind's eye, look at what lies in front of you. Maybe you see the sun, filtered through trees. Maybe clouds are drifting by.  Look to one side, and then the other. Notice the feel of the air around you. Notice how it feels on your face and on your arms.  Stay here for a while. Let it become real for you.  Follow-up care is a key part of your treatment and safety. Be sure to make and go to all appointments, and call your doctor if you are having problems. It's also a good idea to know your test results and keep a list of the medicines you take.  Where can you learn more?  Go to [facility] at [email or web address]  Select Preferences in the upper right hand corner, then select Health Library under Resources. Enter N291 in the search box to learn more about Learning About Guided Imagery for Stress.  Current as of: January 15, 2016  Content Version: 11.6  ?? 2006-2018 [employer], Inc[employer]. Care instructions adapted under license by [facility]. If you have questions  about a medical condition or this instruction, always ask your healthcare professional. Healthwise, Incorporated disclaims any warranty or liability for your use of this information.        Learning About the Mediterranean Diet  What is the Mediterranean diet?    The Mediterranean diet is a style of eating rather than a diet plan. It features foods eaten in [place], [place], southern [place] and [place], and other countries along the [employer]. It emphasizes eating foods like fish, fruits, vegetables, beans, high-fiber breads and whole grains, nuts, and olive oil. This style of eating includes limited red meat, cheese, and sweets.  Why choose the Mediterranean diet?  A Mediterranean-style diet may improve heart health. It contains more fat than other heart-healthy diets. But the fats are mainly from nuts, unsaturated oils (such as fish oils and olive oil), and certain nut or seed oils (such as canola, soybean, or flaxseed oil). These fats may help protect the heart and blood vessels.  How can you get started on the Mediterranean diet?  Here are some things you can do to switch to a more Mediterranean way of eating.  What to eat  Eat a variety of fruits and vegetables each day, such as grapes, blueberries, tomatoes, broccoli, peppers, figs, olives, spinach, eggplant, beans, lentils, and chickpeas.  Eat a variety of whole-grain foods each day, such as oats, brown rice, and whole wheat bread, pasta, and couscous.  Eat fish at least 2 times a week. Try tuna, salmon, mackerel, lake trout, herring, or sardines.  Eat moderate amounts of low-fat dairy products, such as milk, cheese, or yogurt.  Eat moderate amounts of poultry and eggs.  Choose healthy (unsaturated) fats, such as nuts, olive oil, and certain nut or seed oils like canola, soybean, and flaxseed.  Limit unhealthy (saturated) fats, such as butter, palm oil, and coconut oil. And limit fats found in animal products, such as meat and dairy products made with whole milk. Try to eat red meat only a few times a month in very small amounts.  Limit sweets and desserts to only a few times a week. This includes sugar-sweetened drinks like soda.  The Mediterranean diet may also include red wine with your meal--1 glass each day for women and up to 2 glasses a day for men.  Tips for eating at home  Use herbs, spices, garlic, lemon zest, and citrus juice instead of salt to add flavor to foods.  Add avocado slices to your sandwich instead of bacon.  Have fish for lunch or dinner instead of red meat. Brush the fish with olive oil, and broil or grill it.  Sprinkle your salad with seeds or nuts instead of cheese.  Cook with olive or canola oil instead of butter or oils that are high in saturated fat.  Switch from 2% milk or whole milk to 1% or fat-free milk.  Dip raw vegetables in a vinaigrette dressing or hummus instead of dips made from mayonnaise or sour cream.  Have a piece of fruit for dessert instead of a piece of cake. Try baked apples, or have some dried fruit.  Tips for eating out  Try broiled, grilled, baked, or poached fish instead of having it fried or breaded.  Ask your server to have your meals prepared with olive oil instead of butter.  Order dishes made with marinara sauce or sauces made from olive oil. Avoid sauces made from cream or mayonnaise.  Choose whole-grain breads, whole wheat pasta and pizza crust, brown rice,  beans, and lentils.  Cut back on butter or margarine on bread. Instead, you can dip your bread in a small amount of olive oil.  Ask for a side salad or grilled vegetables instead of french fries or chips.  Where can you learn more?  Go to [facility] at [email or web address]  [facility] under the Resources menu. Enter O407 in the search box to learn more about Learning About the Mediterranean Diet.  Current as of: February 11, 2017  Content Version: 12.2  ?? 2006-2019 [employer]. Care instructions adapted under license by [facility]. If you have questions about a medical condition or this instruction, always ask your healthcare professional. Healthwise, Inc[employer] disclaims any warranty or liability for your use of this information.            Stretching: Exercises  Your Care Instructions  Here are some examples of exercises for stretching. Start each exercise slowly. Ease off the exercise if you start to have pain.  Your doctor or physical therapist will tell you when you can start these exercises and which ones will work best for you.  How to do the exercises  Latissimus stretch    Stand with your back straight and your feet shoulder-width apart. You can do this stretch sitting down if you are not steady on your feet.  Hold your arms above your head, and hold one hand with the other.  Pull upward while leaning straight over toward your right side. Keep your lower body straight. You should feel the stretch along your left side.  Hold 15 to 30 seconds, and then switch sides.  Repeat 2 to 4 times for each side.  Triceps stretch    Stand with your back straight and your feet shoulder-width apart. You can do this stretch sitting down if you are not steady on your feet.  Bring your left elbow straight up while bending your arm.  Grab your left elbow with your right hand, and pull your left elbow toward your head with light pressure. If you are more flexible, you may pull your arm slightly behind your head. You will feel the stretch along the back of your arm.  Hold 15 to 30 seconds, and then switch elbows.  Repeat 2 to 4 times for each arm.  Calf stretch    Place your hands on a wall for balance. You can also do this with your hands on the back of a chair, a countertop, or a tree.  Step back with your left leg. Keep the leg straight, and press your left heel into the floor.  Press your hips forward, bending your right leg slightly. You will feel the stretch in your left calf.  Hold the stretch 15 to 30 seconds.  Repeat 2 to 4 times for each leg.  Quadriceps stretch    Lie on your side with one hand supporting your head.  Bend your upper leg back and grab your ankle with your other hand.  Stretch your leg back by pulling your foot toward your buttocks. You will feel the stretch in the front of your thigh. If this causes stress on your knees, do not do this stretch.  Hold the stretch 15 to 30 seconds.  Repeat 2 to 4 times for each leg.  Groin stretch    Sit on the floor and put the soles of your feet together. Do not slump your back.  Grab your ankles and gently pull your legs toward you.  Press your  knees toward the floor. You will feel the stretch in your inner thighs.  Hold 15 to 30 seconds.  Repeat 2 to 4 times.  Hamstring stretch in doorway    Lie on the floor near a doorway, with your buttocks close to the wall.  Let the leg you are not stretching extend through the doorway.  Put the leg you want to stretch up on the wall, and straighten your knee to feel a gentle stretch at the back of your leg.  Hold the stretch for at least 15 to 30 seconds. Repeat 2 to 4 times.  Follow-up care is a key part of your treatment and safety. Be sure to make and go to all appointments, and call your doctor if you are having problems. It's also a good idea to know your test results and keep a list of the medicines you take.  Where can you learn more?  Go to [facility] at [email or web address]  Select Preferences in the upper right hand corner, then select Health Library under Resources. Enter P733 in the search box to learn more about Stretching: Exercises.  Current as of: March 13, 2016  Content Version: 11.6  ?? 2006-2018 [employer], [employer]. Care instructions adapted under license by [facility]. If you have questions about a medical condition or this instruction, always ask your healthcare professional. Health[employer], Incorporated disclaims any warranty or liability for your use of this information.           Patient Education        Preventing Falls: Care Instructions  Injuries and health problems such as trouble walking or poor eyesight can increase your risk of falling. So can some medicines. But there are things you can do to help prevent falls. You can exercise to get stronger. You can also arrange your home to make it safer.    Talk to your doctor about the medicines you take. Ask if any of them increase the risk of falls and whether they can be changed or stopped.   Try to exercise regularly. It can help improve your strength and balance. This can help lower your risk of falling.         Practice fall safety and prevention.   Wear low-heeled shoes that fit well and give your feet good support. Talk to your doctor if you have foot problems that make this hard.  Carry a cellphone or wear a medical alert device that you can use to call for help.  Use stepladders instead of chairs to reach high objects. Don't climb if you're at risk for falls. Ask for help, if needed.  Wear the correct eyeglasses, if you need them.        Make your home safer.   Remove rugs, cords, clutter, and furniture from walkways.  Keep your house well lit. Use night-lights in hallways and bathrooms.  Install and use sturdy handrails on stairways.  Wear nonskid footwear, even inside. Don't walk barefoot or in socks without shoes.        Be safe outside.   Use handrails, curb cuts, and ramps whenever possible.  Keep your hands free by using a shoulder bag or backpack.  Try to walk in well-lit areas. Watch out for uneven ground, changes in pavement, and debris.  Be careful in the winter. Walk on the grass or gravel when sidewalks are slippery. Use de-icer on steps and walkways. Add non-slip devices to shoes.    Put grab bars and nonskid mats in your  shower or tub and near the toilet. Try to use a shower chair or bath bench when bathing.   Get into a tub or shower by putting in your weaker leg first. Get out with your strong side first. Have a phone or medical alert device in the bathroom with you.   Where can you learn more?  Go to [facility] at [email or web address] forces logistics/[email or web address] in the Menu. Enter G117 in the search box to learn more about Preventing Falls: Care Instructions.  Current as of: October 21, 2021  Content Version: 14.1  ?? 2006-2024 [employer]wise, [employer].   Care instructions adapted under license by [facility]. If you have questions about a medical condition or this instruction, always ask your healthcare professional. [employer] disclaims any warranty or liability for your use of this information.

## 2022-12-13 NOTE — Unmapped (Signed)
Assessment/Plan     Diagnoses and all orders for this visit:    Type 2 diabetes mellitus with stage 3b chronic kidney disease, without long-term current use of insulin (CMS-HCC)  -     POCT glycosylated hemoglobin (Hb A1C)  Stable overall. Well controlled overall. Continue current regimen.    Memory changes  Stable.  Standard education and anticipatory guidance regarding goals, objectives, and sequelae.  Continue current management and plan of care.  Continue aggressive risk factor reduction.    Pruritus  Genitourinary region.  Stable.  Continue current management and plan of care.  Keep appointment with Dermatology.    Coronary artery disease involving native coronary artery of native heart, unspecified whether angina present  Clinically asymptomatic.   Standard education and anticipatory guidance regarding goals, objectives, and sequelae.  Continue current management and plan of care.  Continue aggressive risk factor reduction.    History of ischemic stroke  Clinically asymptomatic.   Standard education and anticipatory guidance regarding goals, objectives, and sequelae.  Continue current management and plan of care.  Continue aggressive risk factor reduction.    Stage 3a chronic kidney disease (CMS-HCC)  Clinically stable.  Standard education and anticipatory guidance regarding goals, objectives, and sequelae.  Continue current management and plan of care.  Continue aggressive risk factor reduction.    Peripheral polyneuropathy  Clinically stable.  Standard education and anticipatory guidance regarding goals, objectives, and sequelae.  Continue current management and plan of care.  Continue aggressive risk factor reduction.    History of anemia  Clinically asymptomatic. No evidence of ongoing blood loss.    Urinary incontinence, unspecified type  Unclear particular etiology likely multifactorial.  Standard education guidance.  Conservative measures.  Recommend evaluation with urology.  -     Ambulatory referral to Urology; Future      Disposition: Return in about 3 months (around 03/11/2023) for Recheck, DM, Memory, Mood, Anemia.  03/11/2023      Diagnoses and plan along with any newly prescribed medication(s), recommendations, orders and therapies were discussed in detail with patient today.   Standard education and anticipatory guidance regarding goals, objectives and potential sequelae were reviewed. Patient voiced appropriate understanding of assessment and plan of care.    Pertinent handouts were given today and reviewed with the patient as indicated.  The Care Plan and Self-Management goals have been included on the AVS and the AVS has been printed.  Where helpful for monitoring and management I have encouraged the patient to keep regular logs for me to review at their next visit. Any outside resources or referrals needed at this time are noted above. No additional referrals necessary at this time. Patient voiced understanding and all questions have been answered to satisfaction.     To complete this documentation in a timely manner, portions of this encounter may have been completed utilizing Dragon voice recognition software. Please take this into consideration in noting nuances of documentation, grammar, punctuation, and wording. If and when a question exists, or for clarification, please contact the author.    Jesus Alvine, MD  [facility]  [address], [address]  [place], [name] [address]  Phone [phone]    Subjective:     NWG:NFAOZH, Jesus Beam, MD    Jesus Rubio, is a 77 y.o. male    Chief Complaint   Patient presents with    Memory Loss     Patient still he feels like his memory has improved.  Mood     Patient states he has been doing well    Itching     Patient states he is still having the ongoing itching but he does have an appointment with dermatology on the 11th.     Sore Throat     Patient states that he has been have throat for about a week. Patient denies having any other symptoms        HPI:    Patient presents: Alone.    Nursing notes and history reviewed with patient.    Diabetes mellitus type 2.  Goal A1c less than 8.  No hypo or hyperglycemic symptoms.  Compliant medication regimen denies side effects.    Memory changes.  Most recently stable.  Denies new additional symptoms of concern.      Pruritus genitourinary region.  No concerning skin breakdown. Current appoint with dermatology pending.      Coronary artery disease. Compliant with therapy. No chest pain, shortness of breath, orthopnea, PND.  No increase nitroglycerin use.  No bleeding complications from aspirin therapy noted.    History of ischemic stroke.  Denies recent concerning related dermatology.  No acute strokelike symptoms.    Stage III chronic kidney disease.  No urinary tract symptoms, shortness of breath, abdominal swelling, lower extremity edema, orthopnea, pnd.  Compliant with renal protection recommendations.    Peripheral neuropathy.  Most recently stable.  Denies new additional symptoms of concern.     History of anemia. Clinically asymptomatic. No evidence of ongoing blood loss.    Urinary incontinence.  Ongoing.  No worsening.  No additional lower urinary tract symptoms of concern.    No other complaints.  No additional recent health care visits elsewhere. No urgent care or ED visits.  No additional palliative, provocative, modifying, quantifying, qualifying, or other related factors.  Unless otherwise outlined above patient is pleased with current medication regimen, admits compliance and denies side effects of medication.    The following portions of the patient's history were reviewed and updated as appropriate: allergies, current medications, past family history, past medical history, past social history, past surgical history and problem list.    ROS: Pertinent positives and negatives are as outlined in the HPI above. The remaining balance of the 12 point review of systems is otherwise benign.      PCMH Components:      Goals         <130/80       Lack of exercise or physical activity    Increase exercise or physical activity        Get heart rate stabilized (pt-stated)         Things to think about to help me reach my goal:     What are you going to do? Take medications as prescribed, Keep positive thinking, Pace activities, Stay hydrated   How and how much? Daily   How frequent? Daily   Barriers to success?    Solutions to barriers?      06/04/17: Patient continues to work on this goal   09/29/17 Patient continues to work on this goal   12-09-2017: Reports he continues to work on this and is staying positive.        Take actions to prevent falling       12-03-2016: New goal: Will change positions slowly and pay attention to pathways. Uses cane or walker at times.   09/29/17 Patient continues to work on this goal   12-09-2017: Reports 1 fall  6-8 months ago when he missed a step and fell. Denies injury. Uses cane occasionally.        Weight < 90.7 kg (200 lb)       October 07, 2022 11:24 AM AWV Goal   Things to think about to help me reach my goal:     What are you going to do? Lose weight   How and how much? 20 lbs   How frequent? daily   Barriers to success? None identified   Solutions to barriers? N/a                     Family characteristics, patient's social characteristics, patient's cultural characteristics, patient's communication needs, patient's health literacy and behaviors affecting patient's health are outlined under the patient's History Section, Longitudinal Care Plan or Problem List in Epic or as pertinent in History of Present Illness.    Barriers to goals identified and addressed.   See Assessment and Plan for additional pertinent details.    Objective:     Physical Exam   BP 118/78  - Pulse 61  - Temp 36.6 ??C (97.8 ??F) (Oral)  - Resp 18  - Ht 182.9 cm (6' 0.01)  - Wt 98.7 kg (217 lb 8 oz)  - SpO2 96%  - BMI 29.49 kg/m??   Constitutional: Oriented to person, place, and time. Appears well-developed and well-nourished. No distress.   Head: Normocephalic and atraumatic.   Mouth/Throat: Oropharynx is without mass or concerning lesions. Moist mucous membranes.    Eyes: Conjunctivae are normal. No scleral icterus.   Neck: Neck supple. Normal range of motion. No JVD present. No thyromegaly present. No concerning neck mass.   Cardiovascular: Normal rate. Regular rhythm. Normal heart sounds. No murmur heard.  Pulmonary/Chest: Effort normal. No respiratory distress. Breath sounds normal. No wheezes. No rhonchi. No rales. No focal breath sound changes. No chest tenderness.  Abdominal: Soft. Non-distended. No abdominal mass. No tenderness. No rebound. No guarding.   Musculoskeletal: No active synovitis. No concerning joint erythema, warmth or swelling.   Range of motion is without evidence of significant impairment.   Strength is without evidence of significant impairment.   No edema of the extremities.   Lymphadenopathy: There is no lymphadenopathy about the head or neck.  Neurological: Alert and oriented to person, place, and time. No cranial nerve deficit. Normal muscle tone. Normal coordination.  Skin: Skin is warm and dry. No pallor. No rash noted.   Psychiatric: Normal mood, affect and behavior. Judgment and thought content normal.

## 2022-12-17 NOTE — Unmapped (Signed)
[facility] Specialty Pharm[facility] Refill Coordination Note    Specialty Lite Medication(s) to be Shipped:   Repatha    Other medication(s) to be shipped: No additional medications requested for fill at this time     Jesus Rubio, DOB: Aug 27, 1945  Phone: There are no phone numbers on file.      All above HIPAA information was verified with patient's family member, wife.     Was a Nurse, learning disability used for this call? No    Changes to medications: Pranith reports no changes at this time.  Changes to insurance: No      REFERRAL TO PHARMACIST     Referral to the pharmacist: Not needed      [facility]     Shipping address confirmed in Epic.     Delivery Scheduled: Yes, Expected medication delivery date: 09/19.     Medication will be delivered via Same Day Courier to the prescription address in Epic WAM.    Antonietta Barcelona   [facility] Specialty Technician

## 2022-12-25 MED FILL — REPATHA SURECLICK 140 MG/ML SUBCUTANEOUS PEN INJECTOR: SUBCUTANEOUS | 28 days supply | Qty: 2 | Fill #6

## 2023-01-15 NOTE — Unmapped (Signed)
The [facility] has made a second and final attempt to reach this patient to refill the following medication:Repatha.      We have left voicemails on the following phone numbers: [phone], have sent a text message to the following phone numbers: [phone], and have sent a Mychart questionnaire..    Dates contacted: 01/12/23, 01/15/23  Last scheduled delivery: 12/25/22    The patient may be at risk of non-compliance with this medication. The patient should call the [facility] at [phone]  Option 4, then Option 5: Cardiology, Endocrinology to refill medication.    [name]   [facility] Specialty Technician

## 2023-01-16 NOTE — Unmapped (Signed)
[facility] Specialty and Home Delivery Pharmacy Refill Coordination Note    Specialty Lite Medication(s) to be Shipped:   Repatha    Other medication(s) to be shipped: No additional medications requested for fill at this time     Flay Ribera, DOB: July 03, 1945  Phone: There are no phone numbers on file.      All above HIPAA information was verified with patient's family member, [name].     Was a Nurse, learning disability used for this call? No    Changes to medications: Neale reports no changes at this time.  Changes to insurance: No      REFERRAL TO PHARMACIST     Referral to the pharmacist: Not needed      [facility]     Shipping address confirmed in Epic.     Delivery Scheduled: Yes, Expected medication delivery date: 01/21/23.     Medication will be delivered via Same Day Courier to the prescription address in Epic WAM.    [name]   [facility] Specialty and Home Delivery Pharmacy Specialty Technician

## 2023-01-21 MED FILL — REPATHA SURECLICK 140 MG/ML SUBCUTANEOUS PEN INJECTOR: SUBCUTANEOUS | 28 days supply | Qty: 2 | Fill #7

## 2023-01-29 ENCOUNTER — Ambulatory Visit: Admit: 2023-01-29 | Discharge: 2023-01-30 | Attending: Internal Medicine | Primary: Internal Medicine

## 2023-01-29 DIAGNOSIS — I771 Stricture of artery: Principal | ICD-10-CM

## 2023-01-29 DIAGNOSIS — I240 Acute coronary thrombosis not resulting in myocardial infarction: Principal | ICD-10-CM

## 2023-01-29 DIAGNOSIS — I259 Chronic ischemic heart disease, unspecified: Principal | ICD-10-CM

## 2023-01-29 DIAGNOSIS — T466X5A Adverse effect of antihyperlipidemic and antiarteriosclerotic drugs, initial encounter: Principal | ICD-10-CM

## 2023-01-29 DIAGNOSIS — M791 Myalgia, unspecified site: Principal | ICD-10-CM

## 2023-01-29 DIAGNOSIS — E785 Hyperlipidemia, unspecified: Principal | ICD-10-CM

## 2023-01-29 DIAGNOSIS — I701 Atherosclerosis of renal artery: Principal | ICD-10-CM

## 2023-01-29 DIAGNOSIS — R5383 Other fatigue: Principal | ICD-10-CM

## 2023-01-29 DIAGNOSIS — E1169 Type 2 diabetes mellitus with other specified complication: Principal | ICD-10-CM

## 2023-01-29 DIAGNOSIS — E1159 Type 2 diabetes mellitus with other circulatory complications: Principal | ICD-10-CM

## 2023-01-29 DIAGNOSIS — I480 Paroxysmal atrial fibrillation: Principal | ICD-10-CM

## 2023-01-29 DIAGNOSIS — I2089 Chronic stable angina (CMS-HCC): Principal | ICD-10-CM

## 2023-01-29 DIAGNOSIS — Z789 Other specified health status: Principal | ICD-10-CM

## 2023-01-29 DIAGNOSIS — I152 Hypertension secondary to endocrine disorders: Principal | ICD-10-CM

## 2023-01-29 LAB — CBC W/ AUTO DIFF
BASOPHILS ABSOLUTE COUNT: 0 10*9/L (ref 0.0–0.1)
BASOPHILS RELATIVE PERCENT: 0.5 %
EOSINOPHILS ABSOLUTE COUNT: 0.4 10*9/L (ref 0.0–0.5)
EOSINOPHILS RELATIVE PERCENT: 4.6 %
HEMATOCRIT: 43.2 % (ref 39.0–48.0)
HEMOGLOBIN: 14.2 g/dL (ref 12.9–16.5)
LYMPHOCYTES ABSOLUTE COUNT: 1.9 10*9/L (ref 1.1–3.6)
LYMPHOCYTES RELATIVE PERCENT: 21.9 %
MEAN CORPUSCULAR HEMOGLOBIN CONC: 32.9 g/dL (ref 32.0–36.0)
MEAN CORPUSCULAR HEMOGLOBIN: 29.5 pg (ref 25.9–32.4)
MEAN CORPUSCULAR VOLUME: 89.8 fL (ref 77.6–95.7)
MEAN PLATELET VOLUME: 8.6 fL (ref 6.8–10.7)
MONOCYTES ABSOLUTE COUNT: 0.6 10*9/L (ref 0.3–0.8)
MONOCYTES RELATIVE PERCENT: 7.6 %
NEUTROPHILS ABSOLUTE COUNT: 5.5 10*9/L (ref 1.8–7.8)
NEUTROPHILS RELATIVE PERCENT: 65.4 %
NUCLEATED RED BLOOD CELLS: 0 /100{WBCs} (ref <=4)
PLATELET COUNT: 275 10*9/L (ref 150–450)
RED BLOOD CELL COUNT: 4.81 10*12/L (ref 4.26–5.60)
RED CELL DISTRIBUTION WIDTH: 13.5 % (ref 12.2–15.2)
WBC ADJUSTED: 8.5 10*9/L (ref 3.6–11.2)

## 2023-01-29 LAB — COMPREHENSIVE METABOLIC PANEL
ALBUMIN: 4.3 g/dL (ref 3.4–5.0)
ALKALINE PHOSPHATASE: 89 U/L (ref 38–126)
ALT (SGPT): 19 U/L (ref <50)
ANION GAP: 10 mmol/L (ref 7–15)
AST (SGOT): 26 U/L (ref 19–55)
BILIRUBIN TOTAL: 0.5 mg/dL (ref 0.1–1.2)
BLOOD UREA NITROGEN: 12 mg/dL (ref 7–21)
BUN / CREAT RATIO: 9
CALCIUM: 9.4 mg/dL (ref 8.5–10.2)
CHLORIDE: 98 mmol/L (ref 98–107)
CO2: 28 mmol/L (ref 22.0–32.0)
CREATININE: 1.4 mg/dL — ABNORMAL HIGH (ref 0.70–1.30)
EGFR CKD-EPI (2021) MALE: 51 mL/min/{1.73_m2} — ABNORMAL LOW (ref >=60)
GLUCOSE RANDOM: 132 mg/dL — ABNORMAL HIGH (ref 74–106)
POTASSIUM: 5.3 mmol/L — ABNORMAL HIGH (ref 3.5–5.0)
PROTEIN TOTAL: 7.3 g/dL (ref 6.5–8.3)
SODIUM: 136 mmol/L (ref 135–145)

## 2023-01-29 LAB — IRON & TIBC
IRON SATURATION: 21 %
IRON: 81 ug/dL (ref 37–170)
TOTAL IRON BINDING CAPACITY: 392 ug/dL (ref 252–479)

## 2023-01-29 LAB — VITAMIN B12: VITAMIN B-12: 719 pg/mL (ref 187–1059)

## 2023-01-29 LAB — TSH: THYROID STIMULATING HORMONE: 1.86 u[IU]/mL (ref 0.600–3.300)

## 2023-01-29 LAB — MAGNESIUM: MAGNESIUM: 2.1 mg/dL (ref 1.6–2.2)

## 2023-01-29 LAB — FERRITIN: FERRITIN: 23.8 ng/mL

## 2023-01-29 NOTE — Unmapped (Addendum)
Let me know if you want to wear another monitor to evaluate these episodes you have had.    Check your blood glucose during these episodes.    Lab work today.    See me in 4 months    [name], MD, PhD  [facility]  Cardiology

## 2023-01-29 NOTE — Unmapped (Signed)
[facility] CARDIOLOGY    [facility]  [address]  [place], [name] [address]  Ph: [phone]  Fax: [phone] [facility]  [address]  [place], [name] [address]   Ph: [phone]  Fax: [phone]     Date of Service: 01/29/2023    PCP: [name], MD  Referring Provider: [name], PA    ASSESSMENT and PLAN:     Corvon Kempker is a 77 y.o. male presenting at the request of [name], PA for evaluation and management of:  1. Chronic stable angina (CMS-HCC)    2. 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 Hypertension associated with diabetes (CMS-HCC)    4. Fatigue, unspecified type    5. Type 2 diabetes mellitus with other specified complication, without long-term current use of insulin (CMS-HCC)    6. Hyperlipidemia associated with type 2 diabetes mellitus (CMS-HCC)    7. Paroxysmal atrial fibrillation (CMS-HCC)    8. Renal artery stenosis (CMS-HCC)    9. Subclavian arterial stenosis (CMS-HCC)    10. Statin intolerance    11. Myalgia due to statin      Mr. Darko presents in follow up.  I last saw him 10/2022. His angina seems to be under control. He has not required much nitroglycerin.  He continues to have shortness of breath with exertion and generalized fatigue with activity but this is unfortunately been his baseline.  He has been having episodes of feeling hot, sweaty, dizzy.  He feels hungry and then feels better after he eats.  Episodes can last quite some time.  His blood pressure has been checked and okay.  His blood glucose has been checked and okay.  I am not sure what to make of these episodes although I did consider arrhythmia.  I offered him another heart monitor but he wants to hold off for now.  We did discuss his medications and his heart rate.  He is on quite a bit of metoprolol due to angina and determining whether or not he has significant bradycardia or other arrhythmia may be helpful.  For the time being we will not make any medication changes.  I have ordered a battery of test that he can review with his PCP. I will see him again in 4 months.    1. 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 NSTEMI while hospitalized for COVID, managed medically with plavix. Plavix was later discontinued in favor of eliquis due to PE  - anti-platelets: plavix 6 months post PCI renal artery stenosis => switch to aspirin 12/2021  - anti-anginal: metoprolol succinate 100mg  BID, imdur 60mg  daily  - statin: atorvastatin 40mg  daily => 10mg  ?muscle aches on higher dose => crestor 20mg  daily => Repatha, stop crestor  - last echo: 05/12/20 normal EF  - last stress/angiography:04/2021 see below. New since 2016 is CTO left main    2. Afib and Atrial flutter s/p ablation x2. Recurrence of paroxysmal Afib during hospitalization for COVID19 03/2020-04/2020. ER visit 02/2021 for symptomatic afib. No clear trigger.  - CHADS2-Vasc score: 6 (HTN, age+2, PE and CVA+2, IHD)  - Anti-coagulation: Eliquis  - Rate control: metoprolol as above, additional 50mg  prn dose if needed for sustained palpitations  - Rhythm control: not indicated    3. COVID19 with lengthy, complicated hospitalization 12/21-1/22. ARDS, NSTEMI, GI bleed, ARF requiring dialysis, occipital lobe CVA, PE  - has  SOB with exertion, stable    4. History of PE  - on eliquis    5. R subclavian stenosis  - BP in left arm    6. Renal artery stenosis s/p R renal artery DES 05/2021  - plavix for 6 months    Follow-up: Return in about 4 months (around 06/01/2023).     [name], MD, PhD  [facility] Cardiology      ORDERS THIS VISIT:     Orders Placed This Encounter   Procedures    CBC w/ Differential    Comprehensive Metabolic Panel    TSH    Vitamin B12 Level    Magnesium Level    Iron Panel    Ferritin      New Prescriptions    No medications on file      Modified Medications    No medications on file      Discontinued Medications    No medications on file SUBJECTIVE:     ID: Mr. Westgate is a 78 y.o. male with a history of CAD s/p CABG, aflutter s/p ablation, COVID19 with numerous complications during hospitalization 03/2020-04/2020    Reason for Visit: Follow-up     Cardiovascular History:    Previously evaluated by [name] and last saw him 05/2019. He has a history of CABG and aflutter s/p ablation. Ziopatch 06/2019 did not show recurrence of aflutter. Prolonged hospitalization at [facility] from 03/17/20 to 04/17/20 for COVID19 with hypoxic respiratory failure due to ARDS, bilateral subsegmental PE, GI bleed requiring transfusion, NSTEMI, afib, L occipital lobe CVA.    Presented to [facility] ER 07/29/20 after an episode of syncope. ECG showed sinus rhythm with RBBB. A ziopatch was ordered. Ziopatch showed 16 short SVT episodes, appear to be atrial tach.    12/2020 right sided rib fractures    02/26/21 ER visit for afib with RVR and HTN. Prescribed prn diltiazem by ER in addition to BID metoprolol.    04/2021 PET stress for evaluation of CP, showing large reversible apical/anterior/septal defect. Referred for [facility]): interval proximal occlusion of the left main, LIMA is patent but mid LAD occlusion prevents backfilling of the proximal LAD. Unsuccessful attempt to pass a wire through CTO left main. All grafts patent. RAS 70% on the R. Also found to have differential of >63mmHg SBP/DBP between L and R arm.     05/2021 reviewed case with [name]. Plan for treatment of RAS. Do not think coronary intervention will be worth the risk. S/p DES to right renal artery. Angiography also showed right subclavian stenosis.    Interval History:    Not too much chest pain. 4 ntg in last 2 months.    Rare palpitations/skipping. Stable SOB.     Working on weight loss.    No GI or GU bleeding.    Has episodes of getting really hot hungry sweaty - has checked BP, BG both ok.         Review of Systems  10 systems were reviewed and negative except as noted in HPI.    Cardiovascular History and Pertinent Past Medical History:  CAD s/p CABG   Aflutter s/p ablation  Afib  COVID19 03/2020 with prolonged hospitalization  Pulmonary embolism 03/2020  CVA 03/2020  RAS s/p R renal artery PCI 05/2021  R subclavian stenosis     has a past medical history of Acute hypoxemic respiratory failure (CMS-HCC) (03/17/2020), Alcoholism (CMS-HCC), Anxiety, Arthritis, At risk for falls, B12 deficiency, CHF (  congestive heart failure) (CMS-HCC), Coronary artery disease, Diabetes mellitus (CMS-HCC), Financial difficulties, Hearing impairment, Heart disease, Hyperlipemia, Hypertension, Joint pain, Major depressive disorder, Memory changes (10/10/2022), Peptic ulceration, PONV (postoperative nausea and vomiting), Renal disorder, and Stage 3a chronic kidney disease (CMS-HCC) (07/11/2014).    Prior Cardiovascular Interventions / Surgery:  CABG 2006 LIMA-LAD,  SVG-OM, SVG-PDA  Aflutter ablation 2016, and 2017  R renal artery PCI 05/2021 [facility]    Prior Cardiovascular Diagnostics:    ECG 06/28/20 sinus rhythm 77bpm, RBBB with non-specific ST/TW abnormalities  ECG 02/26/21 afib 120bpm, RBBB  ECG 03/05/21 sinus rhythm with PACs, RBBB  ECG 04/04/21 sinus rhythm with PACs, RBBB  ECG 07/04/21 sinus rhythm, RBBB    Echo 04/11/20 EF normal  Echo 12/2020 normal EF, normal RV  Echo 10/2021 normal EF    Ziopatch 07/2020  - 16 short SVT episodes were recorded, the longest lasting 7.7 seconds. These appear to be atrial tachycardia.   - Otherwise unremarkable 14 day ambulatory cardiac monitor without patient triggered events.     PET Stress 04/2021  - Abnormal myocardial perfusion study  - There is a large in size, moderately severe, completely reversible perfusion defect involving the apical, apical anterior, mid anterior, basal anterior, apical septal, mid anteroseptal, basal anteroseptal, mid inferoseptal and basal inferoseptal segments. This is consistent with probable ischemia.  - Left ventricular systolic function is normal. Post stress the ejection fraction is > 60%.  - Coronary and aortic calcifications are noted  - Aortic valve calcifications are noted  - Attenuation CT scan shows post CABG findings  - Status post cholecystectomy  - Nonspecific bilateral reticular changes in the lungs, including areas of atelectasis    LHC 05/04/21  Small area of lateral wall hypokinesis with preserved LV systolic function.  Coronary artery disease including proximal occlusion of the left main and long 70% lesion in the mid-RCA.  Prior coronary bypass surgery with patent LIMA to distal LAD, patent SVG to OM1 and OM2 and patent SVG to PDA.   70% right renal artery stenosis. Widely patent left renal artery.  Interval change since angiography on 07/14/14 was total occlusion of left main. Proximal LAD was now supplied by collaterals from PDA since occlusion of mid-LAD (present in 2016) prevented backfilling of the proximal LAD from LIMA to LAD graft.  Unsuccessful attempt to pass a wire through totally occluded left main.      Other Surgical History:   has a past surgical history that includes Replacement total knee; Cholecystectomy; Thyroidectomy, partial; Prostate surgery; Foot arthrodesis, triple; Cardiac surgery (2006); Joint replacement; pr upper gi endoscopy,biopsy (N/A, 12/01/2013); pr compre ep eval abltj 3d mapg tx svt (N/A, 05/16/2015); Knee arthroscopy; pr colonoscopy flx dx w/collj spec when pfrmd (N/A, 04/03/2020); pr gi imag intraluminal esophagus-ileum w/i&r (N/A, 04/05/2020); pr cath place/coron angio, img super/interp,w left heart ventriculography (N/A, 05/03/2021); and pr revascularize fem/pop artery,angioplasty/stent (N/A, 06/03/2021).    Current Outpatient Medications   Medication Instructions    acetaminophen (TYLENOL) 650 mg, Oral, Every 6 hours PRN    albuterol HFA 90 mcg/actuation inhaler 2 puffs, Inhalation, Every 6 hours PRN    aspirin (ECOTRIN) 81 mg, Oral, Daily (standard)    blood sugar diagnostic (ONETOUCH VERIO TEST STRIPS) Strp USE TO CHECK BLOOD SUGAR 1-2 TIMES PER DAY    blood-glucose meter kit Check BS every day    capsaicin (ZOSTRIX) 0.025 % cream Topical, 2 times a day (standard), Use gloves to apply. Wash hands after use. Avoid contact with face, skin  folds and mucous membranes.    cyanocobalamin (vitamin B-12) 1,000 mcg, Subcutaneous, Every 30 days    dilTIAZem (CARDIZEM) 30 MG tablet 1 tab Q3 hr PRN bad palpitations, HR>110.  Be sure top number blood pressure over 95.    donepezil (ARICEPT) 5 mg, Oral, Nightly    ELIQUIS 5 mg, Oral, 2 times a day (standard)    empty container (SHARPS-A-GATOR DISPOSAL SYSTEM) Misc Use as directed for sharps disposal    ergocalciferol-1,250 mcg (50,000 unit) (DRISDOL) 1,250 mcg, Oral, Weekly    evolocumab (REPATHA SURECLICK) 140 mg/mL PnIj Inject the contents of 1 pen (140 mg total) under the skin every fourteen (14) days.    ferrous sulfate 325 mg, Oral, 2 times a day (standard)    fluticasone propionate (FLONASE) 50 mcg/actuation nasal spray 2 sprays, Each Nare, Daily (standard)    gabapentin (NEURONTIN) 100 mg, Oral, 3 times a day (standard)    glipiZIDE (GLUCOTROL) 5 mg, Oral, Every morning    isosorbide mononitrate (IMDUR) 60 mg, Oral, Daily (standard)    metoPROLOL succinate (TOPROL-XL) 100 MG 24 hr tablet TAKE 2 TABLETS BY MOUTH EVERY DAY    miscellaneous medical supply Misc Glucometer  Dx: E11.9    miscellaneous medical supply Misc Glucometer test strips   Dx: E11.9    miscellaneous medical supply Misc Lancets  Dx  E11.9    nitroglycerin (NITROLINGUAL) 0.4 mg/dose spray 1 spray, Sublingual, Every 5 min PRN    nystatin (MYCOSTATIN) 100,000 unit/gram cream Topical, 3 times a day (standard)    pramoxine 1 % Crea Apply up to three times daily. Limit to 7 days consistent use.    sucralfate (CARAFATE) 1 g, Oral, 3 times a day (standard), For stomach protection due to anemia / history of gastritis.    traZODone (DESYREL) 50 MG tablet TAKE 2 TABLETS BY MOUTH EVERY DAY AT NIGHT Allergies:  is allergic to escitalopram oxalate, ace inhibitors, and statins-hmg-coa reductase inhibitors.    Social History:  He  reports that he quit smoking about 50 years ago. His smoking use included cigarettes. He started smoking about 65 years ago. He has a 15 pack-year smoking history. He has been exposed to tobacco smoke. He has never used smokeless tobacco. He reports that he does not drink alcohol and does not use drugs.    Family History:  His family history includes Cancer in his brother, brother, brother, father, mother, sister, and sister; Depression in his mother; Heart attack in his brother, father, and mother; Lung cancer in his brother.       OBJECTIVE:      BP 128/64 (BP Site: R Arm, BP Position: Sitting, BP Cuff Size: Medium)  - Pulse 60  - Temp 37.2 ??C (98.9 ??F) (Temporal)  - Resp 18  - Ht 182.9 cm (6' 0.01)  - Wt 96.8 kg (213 lb 6.4 oz)  - SpO2 91%  - BMI 28.94 kg/m??      Wt Readings from Last 12 Encounters:   01/29/23 96.8 kg (213 lb 6.4 oz)   12/10/22 98.7 kg (217 lb 8 oz)   11/10/22 97.8 kg (215 lb 8 oz)   10/29/22 97.9 kg (215 lb 12.8 oz)   10/07/22 98.9 kg (218 lb)   10/07/22 98.9 kg (218 lb)   09/08/22 (!) 101.6 kg (224 lb)   08/14/22 (!) 104.7 kg (230 lb 12.8 oz)   07/30/22 (!) 105.9 kg (233 lb 8 oz)   04/28/22 (!) 108.4 kg (239 lb)   04/14/22 Marland Kitchen)  109.9 kg (242 lb 3.2 oz)   02/24/22 (!) 110.5 kg (243 lb 8 oz)     BP Readings from Last 5 Encounters:   01/29/23 128/64   12/10/22 118/78   11/10/22 128/70   10/29/22 134/78   10/07/22 132/82     Pulse Readings from Last 3 Encounters:   01/29/23 60   12/10/22 61   11/10/22 70       General:  Alert, no distress.   Eyes:  EOMI, sclerae anicteric   Neck: No carotid bruit. JVP is not visible sitting upright   Resp:   CTAB bilaterally with normal WOB.   CV:  RRR, normal s1 and s2, no murmurs, normal radial pulses b/l, mild LE edema   GI:   Abdomen soft, non-tender   MSK: Normal muscle tone, no joint swelling/effusion   Skin: No lesions/rashes. Neuro: No focal deficits.   Psych: Normal mood, normal affect     Lab Results   Component Value Date    HGB 13.6 10/08/2022    HGB 12.9 04/28/2022    HGB 12.0 (L) 02/13/2022    PLT 263 10/08/2022    PLT 303 04/28/2022    PLT 362 02/13/2022     Lab Results   Component Value Date    CREATININE 1.20 10/08/2022    CREATININE 1.10 04/28/2022    CREATININE 1.40 (H) 02/13/2022    K 4.3 10/08/2022    K 4.8 04/28/2022    K 4.9 02/13/2022      Lab Results   Component Value Date    BNP 408 (H) 03/18/2020     Lab Results   Component Value Date    PROBNP 541.0 (H) 01/16/2022    PROBNP 281.0 04/05/2021    PROBNP 3,890.0 (H) 02/26/2021     Lab Results   Component Value Date    CHOL 105 10/08/2022    CHOL 126 04/28/2022    LDL 24 (L) 10/08/2022    LDL 33 (L) 04/28/2022    HDL 52 10/08/2022    HDL 44 04/28/2022    TRIG 161 10/08/2022    TRIG 244 (H) 04/28/2022     Lab Results   Component Value Date    A1C 6.0 12/10/2022

## 2023-02-11 NOTE — Unmapped (Signed)
[facility] Pharm[facility] Refill Coordination Note    Specialty Lite Medication(s) to be Shipped:   Repatha    Other medication(s) to be shipped: No additional medications requested for fill at this time     Jesus Rubio, DOB: 1945/08/26  Phone: There are no phone numbers on file.      All above HIPAA information was verified with patient.     Was a Nurse, learning disability used for this call? No    Changes to medications: Jesus Rubio reports no changes at this time.  Changes to insurance: No      REFERRAL TO PHARMACIST     Referral to the pharmacist: Not needed      [facility]     Shipping address confirmed in Epic.     Delivery Scheduled: Yes, Expected medication delivery date: 02/19/23.     Medication will be delivered via Same Day Courier to the prescription address in Epic WAM.    [name]   [facility] Specialty and Home Delivery Pharmacy Specialty Technician

## 2023-02-12 DIAGNOSIS — E119 Type 2 diabetes mellitus without complications: Principal | ICD-10-CM

## 2023-02-12 MED ORDER — GLIPIZIDE 5 MG TABLET
ORAL_TABLET | Freq: Every morning | ORAL | 1 refills | 90 days | Status: CP
Start: 2023-02-12 — End: ?

## 2023-02-19 MED FILL — REPATHA SURECLICK 140 MG/ML SUBCUTANEOUS PEN INJECTOR: SUBCUTANEOUS | 28 days supply | Qty: 2 | Fill #8

## 2023-02-21 NOTE — Unmapped (Signed)
02/21/23  9:49 AM    Labs reviewed. K and Cr a bit higher than before. No medication changes were made to precipitate this. Suggest repeat when he sees Dr Hollice Espy in December.     [name], MD, PhD  [facility]  Cardiology

## 2023-03-11 ENCOUNTER — Ambulatory Visit: Admit: 2023-03-11 | Discharge: 2023-03-12 | Attending: Family Medicine | Primary: Family Medicine

## 2023-03-11 DIAGNOSIS — Z862 Personal history of diseases of the blood and blood-forming organs and certain disorders involving the immune mechanism: Principal | ICD-10-CM

## 2023-03-11 DIAGNOSIS — R002 Palpitations: Principal | ICD-10-CM

## 2023-03-11 DIAGNOSIS — G629 Polyneuropathy, unspecified: Principal | ICD-10-CM

## 2023-03-11 DIAGNOSIS — I2089 Chronic stable angina (CMS-HCC): Principal | ICD-10-CM

## 2023-03-11 DIAGNOSIS — Z8673 Personal history of transient ischemic attack (TIA), and cerebral infarction without residual deficits: Principal | ICD-10-CM

## 2023-03-11 DIAGNOSIS — I2510 Atherosclerotic heart disease of native coronary artery without angina pectoris: Principal | ICD-10-CM

## 2023-03-11 DIAGNOSIS — J849 Interstitial pulmonary disease, unspecified: Principal | ICD-10-CM

## 2023-03-11 DIAGNOSIS — R413 Other amnesia: Principal | ICD-10-CM

## 2023-03-11 DIAGNOSIS — N1832 Type 2 diabetes mellitus with stage 3b chronic kidney disease, without long-term current use of insulin (CMS-HCC): Principal | ICD-10-CM

## 2023-03-11 DIAGNOSIS — E1122 Type 2 diabetes mellitus with diabetic chronic kidney disease: Principal | ICD-10-CM

## 2023-03-11 DIAGNOSIS — N1831 Stage 3a chronic kidney disease (CMS-HCC): Principal | ICD-10-CM

## 2023-03-11 DIAGNOSIS — R232 Flushing: Principal | ICD-10-CM

## 2023-03-11 DIAGNOSIS — E538 Deficiency of other specified B group vitamins: Principal | ICD-10-CM

## 2023-03-11 LAB — CBC W/ AUTO DIFF
BASOPHILS ABSOLUTE COUNT: 0.1 10*9/L (ref 0.0–0.1)
BASOPHILS RELATIVE PERCENT: 1.1 %
EOSINOPHILS ABSOLUTE COUNT: 0.3 10*9/L (ref 0.0–0.5)
EOSINOPHILS RELATIVE PERCENT: 3.4 %
HEMATOCRIT: 43.3 % (ref 39.0–48.0)
HEMOGLOBIN: 14.7 g/dL (ref 12.9–16.5)
LYMPHOCYTES ABSOLUTE COUNT: 1.7 10*9/L (ref 1.1–3.6)
LYMPHOCYTES RELATIVE PERCENT: 19.1 %
MEAN CORPUSCULAR HEMOGLOBIN CONC: 33.9 g/dL (ref 32.0–36.0)
MEAN CORPUSCULAR HEMOGLOBIN: 30.3 pg (ref 25.9–32.4)
MEAN CORPUSCULAR VOLUME: 89.4 fL (ref 77.6–95.7)
MEAN PLATELET VOLUME: 8.8 fL (ref 6.8–10.7)
MONOCYTES ABSOLUTE COUNT: 0.6 10*9/L (ref 0.3–0.8)
MONOCYTES RELATIVE PERCENT: 6.9 %
NEUTROPHILS ABSOLUTE COUNT: 6.2 10*9/L (ref 1.8–7.8)
NEUTROPHILS RELATIVE PERCENT: 69.5 %
NUCLEATED RED BLOOD CELLS: 0 /100{WBCs} (ref <=4)
PLATELET COUNT: 285 10*9/L (ref 150–450)
RED BLOOD CELL COUNT: 4.84 10*12/L (ref 4.26–5.60)
RED CELL DISTRIBUTION WIDTH: 13.5 % (ref 12.2–15.2)
WBC ADJUSTED: 9 10*9/L (ref 3.6–11.2)

## 2023-03-11 LAB — IRON & TIBC
IRON SATURATION: 23 %
IRON: 93 ug/dL (ref 37–170)
TOTAL IRON BINDING CAPACITY: 405 ug/dL (ref 252–479)

## 2023-03-11 LAB — BASIC METABOLIC PANEL
ANION GAP: 6 mmol/L — ABNORMAL LOW (ref 7–15)
BLOOD UREA NITROGEN: 16 mg/dL (ref 7–21)
BUN / CREAT RATIO: 11
CALCIUM: 9.5 mg/dL (ref 8.5–10.2)
CHLORIDE: 102 mmol/L (ref 98–107)
CO2: 27 mmol/L (ref 22.0–32.0)
CREATININE: 1.4 mg/dL — ABNORMAL HIGH (ref 0.70–1.30)
EGFR CKD-EPI (2021) MALE: 51 mL/min/{1.73_m2} — ABNORMAL LOW (ref >=60)
GLUCOSE RANDOM: 224 mg/dL — ABNORMAL HIGH (ref 74–106)
POTASSIUM: 4.7 mmol/L (ref 3.5–5.0)
SODIUM: 135 mmol/L (ref 135–145)

## 2023-03-11 LAB — FERRITIN: FERRITIN: 25.6 ng/mL (ref 16.0–294.0)

## 2023-03-11 LAB — MAGNESIUM: MAGNESIUM: 2 mg/dL (ref 1.6–2.2)

## 2023-03-11 MED ORDER — GABAPENTIN 100 MG CAPSULE
ORAL_CAPSULE | Freq: Three times a day (TID) | ORAL | 6 refills | 30 days | Status: CP
Start: 2023-03-11 — End: 2024-03-10

## 2023-03-11 NOTE — Unmapped (Signed)
Placed Zio Patch and gave detailed patient instructions. Patient verbalized understanding. No PA required per insurance.

## 2023-03-16 ENCOUNTER — Ambulatory Visit: Admit: 2023-03-16 | Discharge: 2023-03-17

## 2023-03-16 DIAGNOSIS — R32 Unspecified urinary incontinence: Principal | ICD-10-CM

## 2023-03-16 MED ORDER — SILODOSIN 4 MG CAPSULE
ORAL_CAPSULE | Freq: Every day | ORAL | 3 refills | 0 days | Status: CP
Start: 2023-03-16 — End: ?

## 2023-03-16 NOTE — Unmapped (Signed)
[facility] Delivery Pharmacy Refill Coordination Note    Specialty Lite Medication(s) to be Shipped:   Repatha    Other medication(s) to be shipped: No additional medications requested for fill at this time     Jesus Rubio, DOB: 21-Jan-1946  Phone: [phone] (home)       All above HIPAA information was verified with patient's family member, wife .     Was a Nurse, learning disability used for this call? No    Changes to medications: Dossie reports no changes at this time.  Changes to insurance: No      REFERRAL TO PHARMACIST     Referral to the pharmacist: Not needed      [facility]     Shipping address confirmed in Epic.     Delivery Scheduled: Yes, Expected medication delivery date: 03/24/2023.     Medication will be delivered via Same Day Courier to the prescription address in Epic WAM.    Pasqualina Colasurdo [facility] Specialty Technician

## 2023-03-16 NOTE — Unmapped (Signed)
Assessment/Plan     Diagnoses and all orders for this visit:    Type 2 diabetes mellitus with stage 3b chronic kidney disease, without long-term current use of insulin (CMS-HCC)  -     POCT glycosylated hemoglobin (Hb A1C)  Stable overall. Well controlled overall. Continue current regimen.  -     Basic Metabolic Panel; Future  -     CBC w/ Differential; Future  -     Iron & TIBC; Future  -     Ferritin; Future  -     Magnesium Level; Future    Peripheral polyneuropathy  Stable overall. Well controlled overall. Continue current regimen.  -     gabapentin (NEURONTIN) 100 MG capsule; Take 1 capsule (100 mg total) by mouth Three (3) times a day.    Coronary artery disease involving native coronary artery of native heart, Chronic stable angina (CMS-HCC)  Clinically asymptomatic.   Standard education and anticipatory guidance regarding goals, objectives, and sequelae.  Continue current management and plan of care.  Continue aggressive risk factor reduction.  -     Basic Metabolic Panel; Future  -     External ECG-8 days to 15 days (ZIO XT); Future    Stage 3a chronic kidney disease (CMS-HCC)  Clinically asymptomatic.   Standard education and anticipatory guidance regarding goals, objectives, and sequelae.  Continue current management and plan of care.  Continue aggressive risk factor reduction.  -     Basic Metabolic Panel; Future  -     CBC w/ Differential; Future  -     Iron & TIBC; Future  -     Ferritin; Future  -     Magnesium Level; Future    Palpitations with Flushing  Unclear specific etiology.  Standard education and anticipatory guidance regarding goals, objectives, and sequelae.  Precautions reviewed.  Obtain Zio patch for further evaluation.  -     External ECG-8 days to 15 days (ZIO XT); Future    Memory changes  Clinically stable.  Standard education and anticipatory guidance regarding goals, objectives, and sequelae.  Continue current plan of care.  -     CBC w/ Differential; Future  -     Iron & TIBC; Future  -     Ferritin; Future  -     Magnesium Level; Future    History of ischemic stroke  Clinically asymptomatic.   Standard education and anticipatory guidance regarding goals, objectives, and sequelae.  Continue current management and plan of care.  Continue aggressive risk factor reduction.    History of anemia  Clinically asymptomatic. No evidence of ongoing blood loss.  -     CBC w/ Differential; Future  -     Iron & TIBC; Future  -     Ferritin; Future  -     Magnesium Level; Future    Interstitial pulmonary disease, unspecified (CMS-HCC)  Denies specific related symptoms.  Standard education and anticipatory guidance regarding goals, objectives, and sequelae.  Continue current management and plan of care.    B12 deficiency  Denies specific related symptoms.  Standard education and anticipatory guidance regarding goals, objectives, and sequelae.  Continue current management and plan of care.  -     CBC w/ Differential; Future  -     Iron & TIBC; Future  -     Ferritin; Future  -     Magnesium Level; Future    Other orders  -     gentamicin (GARAMYCIN)  0.1 % cream; Apply 1 Application topically.      Disposition: Return in about 3 months (around 06/09/2023) for Recheck, DM.  06/09/2023      Diagnoses and plan along with any newly prescribed medication(s), recommendations, orders and therapies were discussed in detail with patient today.   Standard education and anticipatory guidance regarding goals, objectives and potential sequelae were reviewed. Patient voiced appropriate understanding of assessment and plan of care.    Pertinent handouts were given today and reviewed with the patient as indicated.  The Care Plan and Self-Management goals have been included on the AVS and the AVS has been printed.  Where helpful for monitoring and management I have encouraged the patient to keep regular logs for me to review at their next visit. Any outside resources or referrals needed at this time are noted above. No additional referrals necessary at this time. Patient voiced understanding and all questions have been answered to satisfaction.     To complete this documentation in a timely manner, portions of this encounter may have been completed utilizing Dragon voice recognition software. Please take this into consideration in noting nuances of documentation, grammar, punctuation, and wording. If and when a question exists, or for clarification, please contact the author.    Barron Alvine, MD  [facility]  [address], [address]  [place], [name] [address]  Phone [phone]    Subjective:     NWG:NFAOZH, [name], MD    Jesus Rubio, is a 77 y.o. male    Chief Complaint   Patient presents with    Diabetes     Patient states he does check blood sugar. Patient states at times he gets really hot, sweaty and sick feeling. Patient states that if he eats then he feels better. Patient states when he checks blood sugar his reading are up and down.     High Potassium     Patient states Dr. Mervyn Skeeters wanted him to get labs rechecked today.     Anemia     Patient states he stays tired all the time.        HPI:    Patient presents: Alone.    Nursing notes and history reviewed with patient.    Diabetes mellitus type 2.  Goal A1c less than 8.  No hypo or hyperglycemic symptoms.  Compliant with medication regimen denies side effects.    Stage III chronic kidney disease.  No urinary tract symptoms, shortness of breath, abdominal swelling, lower extremity edema, orthopnea, pnd.  Compliant with renal protection recommendations.    Peripheral polyneuropathy.  Stable.  No new symptoms of concern.     Coronary artery disease with chronic stable angina.  No recent chest pain. Compliant with therapy. Denies shortness of breath, orthopnea, PND.  No increase nitroglycerin use.  No bleeding complications from aspirin therapy noted.    Sometimes notes palpitations and flushing.  Reports feeling hot, sweaty, sick feeling.  He has checked his blood sugar and the readings can be high or low but nothing of hypoglycemia less than 60.  Eating sometimes helps.  Symptoms will last for several minutes up to an hour.  No presyncope or syncope.    For memory changes.  Most recently clinically stable.  Denies new additional symptoms of concern.      History of ischemic stroke.  No recent strokelike symptoms.    History of anemia. Clinically asymptomatic. No evidence of ongoing blood loss.  Interstitial pulmonary disease.  No recent cough or shortness of breath.  No recent chest pain.    B12 deficiency.  Denies recent concerning related symptoms.    No other complaints.  No additional recent health care visits elsewhere. No urgent care or ED visits.  No additional palliative, provocative, modifying, quantifying, qualifying, or other related factors.  Unless otherwise outlined above patient is pleased with current medication regimen, admits compliance and denies side effects of medication.    The following portions of the patient's history were reviewed and updated as appropriate: allergies, current medications, past family history, past medical history, past social history, past surgical history and problem list.    ROS: Pertinent positives and negatives are as outlined in the HPI above. The remaining balance of the 12 point review of systems is otherwise benign.      PCMH Components:      Goals         <130/80       Lack of exercise or physical activity    Increase exercise or physical activity        Get heart rate stabilized (pt-stated)         Things to think about to help me reach my goal:     What are you going to do? Take medications as prescribed, Keep positive thinking, Pace activities, Stay hydrated   How and how much? Daily   How frequent? Daily   Barriers to success?    Solutions to barriers?      06/04/17: Patient continues to work on this goal   09/29/17 Patient continues to work on this goal   12-09-2017: Reports he continues to work on this and is staying positive.        Take actions to prevent falling       12-03-2016: New goal: Will change positions slowly and pay attention to pathways. Uses cane or walker at times.   09/29/17 Patient continues to work on this goal   12-09-2017: Reports 1 fall 6-8 months ago when he missed a step and fell. Denies injury. Uses cane occasionally.        Weight < 90.7 kg (200 lb)       October 07, 2022 11:24 AM AWV Goal   Things to think about to help me reach my goal:     What are you going to do? Lose weight   How and how much? 20 lbs   How frequent? daily   Barriers to success? None identified   Solutions to barriers? N/a                     Family characteristics, patient's social characteristics, patient's cultural characteristics, patient's communication needs, patient's health literacy and behaviors affecting patient's health are outlined under the patient's History Section, Longitudinal Care Plan or Problem List in Epic or as pertinent in History of Present Illness.    Barriers to goals identified and addressed.   See Assessment and Plan for additional pertinent details.    Objective:     Physical Exam   BP 124/72 (BP Site: R Arm)  - Pulse 81  - Temp 37.1 ??C (98.7 ??F) (Oral)  - Resp 18  - Ht 182.9 cm (6' 0.01)  - Wt 100.2 kg (221 lb)  - SpO2 95%  - BMI 29.97 kg/m??   Constitutional: Oriented to person, place, and time. Appears well-developed and well-nourished. No distress.   Head: Normocephalic and atraumatic.   Mouth/Throat: Oropharynx  is without mass or concerning lesions. Moist mucous membranes.    Eyes: Conjunctivae are normal. No scleral icterus.   Neck: Neck supple. Normal range of motion. No JVD present. No thyromegaly present. No concerning neck mass.   Cardiovascular: Normal rate. Regular rhythm. Normal heart sounds. No murmur heard.  Pulmonary/Chest: Effort normal. No respiratory distress. Breath sounds normal. No wheezes. No rhonchi. No rales. No focal breath sound changes.   No chest tenderness.  Abdominal: Soft. Non-distended. No abdominal mass. No tenderness. No rebound. No guarding.   Musculoskeletal: No active synovitis. No concerning joint erythema, warmth or swelling.   Range of motion is without evidence of significant impairment.   Strength is without evidence of significant impairment.   No edema of the extremities.   Lymphadenopathy: There is no lymphadenopathy about the head or neck.  Neurological: Alert and oriented to person, place, and time. No cranial nerve deficit. Normal muscle tone. Normal coordination.  Skin: Skin is warm and dry. No pallor. No rash noted.   Psychiatric: Normal mood, affect and behavior. Judgment and thought content normal.

## 2023-03-16 NOTE — Unmapped (Signed)
REFERRING PROVIDER: [name]    REASON FOR VISIT:   Chief Complaint   Patient presents with    Urine Leakage       ASSESSMENT/PLAN:  74M h/o AFlutter, T2DM, memory changes, CAD, stroke, neuropathy, consult for post-void dribbling, frequency/urgency.  No caffeine usage.    Reviewed PCP notes, UA dip, PVR.    - PVR 30; no retention  - UA without evidence of UTI  - Start silodosin  - Discussed fluid restriction prior to bedtime  - f/u for cystoscopy to evaluate for etiology of symptoms    Ulyses Southward, MD  Assistant Professor  Department of Urology    HISTORY OF PRESENT ILLNESS:   [phone] h/o memory changes, CAD, stroke, neuropathy, consult for urinary incontinence    His most bothersome issue is post-void dribbling.  Frequently at night, his pants are wet.  No diapers/pads.    He does note incontinence without trigger.    No coffee, alcohol, tea.  Notes prostate surgery where a doctor had to put tubes in his prostate?  Also notes a rotorooter.  He has a sense of frequency/urgency.    [employer] BPH SYMPTOM SCORE INDEX, 03/16/23 :    1. INCOMPLETE EMPTYING: 0  2. FREQUENCY:              1  3. INTERMITTENCY:         did not fill  4. URGENCY:                0  5. WEAK STREAM:     1  6. STRAINING:              0  7. NOCTURIA:               2    QUALITY OF LIFE:           did not fill      PAST MEDICAL HISTORY:   Past Medical History:   Diagnosis Date    Acute hypoxemic respiratory failure (CMS-HCC) 03/17/2020    Acute hypoxemic respiratory failure resolved from PL. 12/26/2020 note, Dr. Donnie Aho, Acute hypoxemic respiratory failure: resolved, patient will monitor oxygen sats with exertion using home pulse ox.  [place]      Alcoholism (CMS-HCC)     Anxiety     Arthritis     At risk for falls     B12 deficiency     CHF (congestive heart failure) (CMS-HCC)     Coronary artery disease     Diabetes mellitus (CMS-HCC)     Financial difficulties     Hearing impairment     Heart disease     Hyperlipemia Hypertension     Joint pain     Major depressive disorder     Memory changes 10/10/2022    Peptic ulceration     PONV (postoperative nausea and vomiting)     Renal disorder     Stage 3a chronic kidney disease (CMS-HCC) 07/11/2014    Stage 3a chronic kidney disease - resolved from PL as Type 2 diabetes mellitus with stage 3b chronic kidney disease, without long-term current use of insulin is a combination code that captures both dx.  [place]         PAST SURGICAL HISTORY:   Past Surgical History:   Procedure Laterality Date    CARDIAC SURGERY  2006    Open heart surgery    CHOLECYSTECTOMY      FOOT ARTHRODESIS, TRIPLE  JOINT REPLACEMENT      KNEE ARTHROSCOPY      PR CATH PLACE/CORON ANGIO, IMG SUPER/INTERP,W LEFT HEART VENTRICULOGRAPHY N/A 05/03/2021    Procedure: Left Heart Catheterization;  Surgeon: Alvira Philips, MD;  Location: [facility] CATH;  Service: Cardiology    PR COLONOSCOPY FLX DX W/COLLJ SPEC WHEN PFRMD N/A 04/03/2020    Procedure: COLONOSCOPY, FLEXIBLE, PROXIMAL TO SPLENIC FLEXURE; DIAGNOSTIC, W/WO COLLECTION SPECIMEN BY BRUSH OR WASH;  Surgeon: Bronson Curb, MD;  [facility];  Service: Gastroenterology    PR COMPRE EP EVAL ABLTJ 3D MAPG TX SVT N/A 05/16/2015    Procedure: A-Flutter Ablation;  Surgeon: Dorcas Carrow, MD;  Location: [facility] EP;  Service: Cardiology    PR GI IMAG INTRALUMINAL ESOPHAGUS-ILEUM W/I&R N/A 04/05/2020    Procedure: GI VIDEO TRACT IMAGE INTRALUMINAL (EG, CAPSULE ENDOSCOPY), ESOPHAGUS VIA ILEUM, PHYSICIAN INTERPRETATION & REPORT;  Surgeon: Beverly Milch, MD;  [facility];  Service: Gastrointestinal    PR REVASCULARIZE FEM/POP ARTERY,ANGIOPLASTY/STENT N/A 06/03/2021    Procedure: Peripheral Angiography W Intervetion;  Surgeon: Alvira Philips, MD;  Location: [facility] CATH;  Service: Cardiology    PR UPPER GI ENDOSCOPY,BIOPSY N/A 12/01/2013    Procedure: EGD;  Surgeon: Darletta Moll, MD;  Location: [facility]; Service: General Surgery    PROSTATE SURGERY      REPLACEMENT TOTAL KNEE      RIGHT    THYROIDECTOMY, PARTIAL         ALLERGIES:   Allergies   Allergen Reactions    Escitalopram Oxalate Hives, Itching and Nausea And Vomiting    Ace Inhibitors     Escitalopram     Statins-Hmg-Coa Reductase Inhibitors Muscle Pain       FAMILY HISTORY:   Family History   Problem Relation Age of Onset    Heart attack Mother     Cancer Mother     Depression Mother     Heart attack Father     Cancer Father     Cancer Sister         Pancreatic cancer    Heart attack Brother     Lung cancer Brother     Cancer Brother     Cancer Brother         Bladder    Cancer Brother         Prostate with Metastasis    Cancer Sister     Mental illness Neg Hx     Drug abuse Neg Hx        SOCIAL HISTORY:    reports that he quit smoking about 50 years ago. His smoking use included cigarettes. He started smoking about 65 years ago. He has a 15 pack-year smoking history. He has been exposed to tobacco smoke. He has never used smokeless tobacco. He reports that he does not drink alcohol and does not use drugs.    PHYSICAL EXAM:   GENERAL: Pleasant male in no acute distress.   VITAL SIGNS: Pulse 56, temperature 36.3 ??C (97.4 ??F), temperature source Temporal, height 182.9 cm (6' 0.01), weight 100.2 kg (221 lb), SpO2 97%.  PULMONARY: Normal work of breathing, no use of accessory muscles  PSYCH: Normal affect, normal mood

## 2023-03-24 MED FILL — REPATHA SURECLICK 140 MG/ML SUBCUTANEOUS PEN INJECTOR: SUBCUTANEOUS | 28 days supply | Qty: 2 | Fill #9

## 2023-03-24 NOTE — Unmapped (Signed)
Spoke with phobe from [employer] and she just wanted to confirm that patient has a diagnosis of diabetes. Informed her that he did have diabetes. Phobe verbalized understanding

## 2023-03-24 NOTE — Unmapped (Signed)
Copied from CRM #[id number]. Topic: Access To Clinicians - Req Clinic Call Back  >> Mar 24, 2023  3:32 PM Summer P wrote:  The patient is requesting for the Provider or clinical staff to contact them in regards to the patients wife states that their insurance agent instructed them to give this number to their provider so they may call to give the information they need for their new plan please call back to assist at phone: [phone].      Routine callback turnaround time: 24-48 business hours. Programmer, systems Notified)

## 2023-03-28 NOTE — Unmapped (Signed)
Copied from CRM #[id number]. Topic: Other - Other  >> Mar 27, 2023  4:11 PM Kendrick [place] wrote:  Pt need a Chronic condition verification form completed before Dec 31 so I scheduled a return general appt with Dr.Melvin on 12/26 because the next available with pcp is not until 1/29. Please inform pt if this appt can be done with Dr.Melvin. Pt was also advised to send form through mychart or drop it off at the office. Thank you

## 2023-03-30 NOTE — Unmapped (Signed)
Spoke with patient in regards to forms informed him that Dr. Hollice Espy was out of office for the week and I'm not sure if Dr. Alinda Money would be able to fill forms out due to him not being aware of patient history. Patient brought forms into office to be reviewed. Informed patient that it would be best for PCP to fill forms out. Patient agreed and scheduled an appointment with Dr. Hollice Espy for 12/30

## 2023-03-30 NOTE — Unmapped (Signed)
Copied from CRM [id number]. Topic: Access To Clinicians - Req Clinic Call Back  >> Mar 30, 2023  3:34 PM Erma Pinto wrote:  The patient is requesting for the Nurse to contact them in regards to Form that was dropped off, PT has advised that the form Can be signed and completed by any provider within clinic. Pt had initially rescheduled his appt being he assumed form can be only filled by primary, however his appt now is 12/30 and he is looking to either have appt moved up or form completed prior to 12/30 to avoid being pushed off another year of waiting. Pt is asking to pleasebe worked in with [employer] or melvin prior to 12/30.     Please contact The patient by Cell Phone      Urgent callback turnaround time: within 24 business hours. Programmer, systems Notified)

## 2023-04-06 ENCOUNTER — Ambulatory Visit: Admit: 2023-04-06 | Discharge: 2023-04-07 | Attending: Family Medicine | Primary: Family Medicine

## 2023-04-06 DIAGNOSIS — J849 Interstitial pulmonary disease, unspecified: Principal | ICD-10-CM

## 2023-04-06 DIAGNOSIS — Z8673 Personal history of transient ischemic attack (TIA), and cerebral infarction without residual deficits: Principal | ICD-10-CM

## 2023-04-06 DIAGNOSIS — E1122 Type 2 diabetes mellitus with diabetic chronic kidney disease: Principal | ICD-10-CM

## 2023-04-06 DIAGNOSIS — R413 Other amnesia: Principal | ICD-10-CM

## 2023-04-06 DIAGNOSIS — I2510 Atherosclerotic heart disease of native coronary artery without angina pectoris: Principal | ICD-10-CM

## 2023-04-06 DIAGNOSIS — N1832 Type 2 diabetes mellitus with stage 3b chronic kidney disease, without long-term current use of insulin (CMS-HCC): Principal | ICD-10-CM

## 2023-04-06 DIAGNOSIS — Z862 Personal history of diseases of the blood and blood-forming organs and certain disorders involving the immune mechanism: Principal | ICD-10-CM

## 2023-04-06 DIAGNOSIS — G629 Polyneuropathy, unspecified: Principal | ICD-10-CM

## 2023-04-06 DIAGNOSIS — N1831 Stage 3a chronic kidney disease (CMS-HCC): Principal | ICD-10-CM

## 2023-04-06 NOTE — Unmapped (Signed)
Patient came in office on 12/30 to get forms completed

## 2023-04-10 DIAGNOSIS — R059 Cough, unspecified type: Principal | ICD-10-CM

## 2023-04-10 DIAGNOSIS — R0989 Other specified symptoms and signs involving the circulatory and respiratory systems: Principal | ICD-10-CM

## 2023-04-10 DIAGNOSIS — J310 Chronic rhinitis: Principal | ICD-10-CM

## 2023-04-10 MED ORDER — AZITHROMYCIN 250 MG TABLET
ORAL_TABLET | 0 refills | 0.00 days | Status: CP
Start: 2023-04-10 — End: ?

## 2023-04-10 MED ORDER — BENZONATATE 200 MG CAPSULE
ORAL_CAPSULE | Freq: Three times a day (TID) | ORAL | 0 refills | 10.00 days | Status: CP | PRN
Start: 2023-04-10 — End: 2024-04-09

## 2023-04-10 MED ORDER — IPRATROPIUM BROMIDE 21 MCG (0.03 %) NASAL SPRAY
Freq: Three times a day (TID) | NASAL | 1 refills | 30.00 days | Status: CP
Start: 2023-04-10 — End: 2024-04-09

## 2023-04-11 NOTE — Unmapped (Signed)
Results reviewed with patient at office visit on 04/06/2023.

## 2023-04-11 NOTE — Unmapped (Signed)
Assessment/Plan     Diagnoses and all orders for this visit:    Type 2 diabetes mellitus with stage 3b chronic kidney disease, without long-term current use of insulin (CMS-HCC)  Stable overall. Well controlled overall. Continue current regimen.  -     HM DIABETES FOOT EXAM    Peripheral polyneuropathy  Clinically stable.  Standard education guidance.  Continue current plan of care.    Coronary artery disease involving native coronary artery of native heart, unspecified whether angina present  Clinically asymptomatic.   Standard education and anticipatory guidance regarding goals, objectives, and sequelae.  Continue current management and plan of care.  Continue aggressive risk factor reduction.    Stage 3a chronic kidney disease (CMS-HCC)  Clinically asymptomatic.   Standard education and anticipatory guidance regarding goals, objectives, and sequelae.  Continue current management and plan of care.  Continue aggressive risk factor reduction.    Memory changes  Clinically stable.  Continue current plan of care.    History of ischemic stroke  Clinically asymptomatic.   Standard education and anticipatory guidance regarding goals, objectives, and sequelae.  Continue current management and plan of care.  Continue aggressive risk factor reduction.    History of anemia  Denies specific related symptoms.    Interstitial pulmonary disease, unspecified (CMS-HCC)  Denies specific related symptoms.    Typical atrial flutter (CMS-HCC)  Asymptomatic.  Continue current management and plan of care.      Disposition: Return for Next scheduled follow up.  04/10/2023      Diagnoses and plan along with any newly prescribed medication(s), recommendations, orders and therapies were discussed in detail with patient today.   Standard education and anticipatory guidance regarding goals, objectives and potential sequelae were reviewed. Patient voiced appropriate understanding of assessment and plan of care.    Pertinent handouts were given today and reviewed with the patient as indicated.  The Care Plan and Self-Management goals have been included on the AVS and the AVS has been printed.  Where helpful for monitoring and management I have encouraged the patient to keep regular logs for me to review at their next visit. Any outside resources or referrals needed at this time are noted above. No additional referrals necessary at this time. Patient voiced understanding and all questions have been answered to satisfaction.     To complete this documentation in a timely manner, portions of this encounter may have been completed utilizing Dragon voice recognition software. Please take this into consideration in noting nuances of documentation, grammar, punctuation, and wording. If and when a question exists, or for clarification, please contact the author.    Barron Alvine, MD  [facility] Primary Care at [facility]  [address]  [place], [name] [address]  Phone [phone]    Subjective:     [id number], [name], MD    Jesus Rubio, is a 78 y.o. male    Chief Complaint   Patient presents with    Complete Form     Form completed, faxed, and copy provided to patient     Flu Vaccine     Pt declines flu vaccine today       HPI:    Patient presents: Alone.    Nursing notes and history reviewed with patient.    Diabetes mellitus type 2.  Goal A1c less than 8.  No hypo or hyperglycemic symptoms.  Compliant medication regimen denies side effects.     Peripheral neuropathy.  Noted previously.  Reports  stable symptomatology.  No acute worsening.    Coronary artery disease. Compliant with therapy. No chest pain, shortness of breath, orthopnea, PND.  No increase nitroglycerin use.  No bleeding complications from aspirin therapy noted.    Stage III chronic kidney disease.  No urinary tract symptoms, shortness of breath, abdominal swelling, lower extremity edema, orthopnea, pnd.  Compliant with renal protection recommendations.    Memory changes. Noted previously.  Currently clinically stable.  Denies any additional symptoms of concern.    History of ischemic stroke.  Noted previously.  Denies recent new concerning related symptomatology.  No new strokelike symptoms.    History of anemia.  Noted previously.  Denies recent evidence of ongoing blood loss.  No abdominal discomfort.  No change in stools.    Interstitial pulmonary disease.  Noted previously.  Denies recent new concerning symptoms.     Typical atrial flutter.  Noted previously.  No recent concerning symptoms.  Most recent cardiology notes reviewed.  No bleeding difficulty with Eliquis.    No other complaints.  No additional recent health care visits elsewhere. No urgent care or ED visits.  No additional palliative, provocative, modifying, quantifying, qualifying, or other related factors.  Unless otherwise outlined above patient is pleased with current medication regimen, admits compliance and denies side effects of medication.    The following portions of the patient's history were reviewed and updated as appropriate: allergies, current medications, past family history, past medical history, past social history, past surgical history and problem list.    ROS: Pertinent positives and negatives are as outlined in the HPI above. The remaining balance of the 12 point review of systems is otherwise benign.      PCMH Components:      Goals         <130/80       Lack of exercise or physical activity    Increase exercise or physical activity        Get heart rate stabilized (pt-stated)         Things to think about to help me reach my goal:     What are you going to do? Take medications as prescribed, Keep positive thinking, Pace activities, Stay hydrated   How and how much? Daily   How frequent? Daily   Barriers to success?    Solutions to barriers?      06/04/17: Patient continues to work on this goal   09/29/17 Patient continues to work on this goal   12-09-2017: Reports he continues to work on this and is staying positive.        Take actions to prevent falling       12-03-2016: New goal: Will change positions slowly and pay attention to pathways. Uses cane or walker at times.   09/29/17 Patient continues to work on this goal   12-09-2017: Reports 1 fall 6-8 months ago when he missed a step and fell. Denies injury. Uses cane occasionally.        Weight < 90.7 kg (200 lb)       October 07, 2022 11:24 AM AWV Goal   Things to think about to help me reach my goal:     What are you going to do? Lose weight   How and how much? 20 lbs   How frequent? daily   Barriers to success? None identified   Solutions to barriers? N/a  Family characteristics, patient's social characteristics, patient's cultural characteristics, patient's communication needs, patient's health literacy and behaviors affecting patient's health are outlined under the patient's History Section, Longitudinal Care Plan or Problem List in Epic or as pertinent in History of Present Illness.    Barriers to goals identified and addressed.   See Assessment and Plan for additional pertinent details.    Objective:     Physical Exam   BP 130/78 (BP Position: Sitting, BP Cuff Size: Medium)  - Pulse 66  - Temp 36.8 ??C (98.2 ??F) (Temporal)  - Resp 18  - Ht 182.9 cm (6' 0.01)  - Wt 99.8 kg (220 lb)  - SpO2 96%  - BMI 29.83 kg/m??   Constitutional: Oriented to person, place, and time. Appears well-developed and well-nourished. No distress.   Head: Normocephalic and atraumatic.   Mouth/Throat: Oropharynx is without mass or concerning lesions. Moist mucous membranes.    Eyes: Conjunctivae are normal. No scleral icterus.   Neck: Neck supple. Normal range of motion. No JVD present. No thyromegaly present. No concerning neck mass.   Cardiovascular: Normal rate. Regular rhythm. Normal heart sounds. No murmur heard.  Pulmonary/Chest: Effort normal. No respiratory distress. Breath sounds normal. No wheezes. No rhonchi. No rales. No focal breath sound changes. No chest tenderness.  Abdominal: Soft. Non-distended. No abdominal mass. No tenderness. No rebound. No guarding.   Musculoskeletal: No active synovitis. No concerning joint erythema, warmth or swelling.   Range of motion is without evidence of significant impairment.   Strength is without evidence of significant impairment.   No edema of the extremities.   Diabetic Foot Exam:  Foot Exam:  No prior amputations.  Normal foot architecture.  2+ Dorsalis Pedis & Posterior Tibial Bilateral.  Decreased sensation throughout.  No concerning callus or ulcers  Lymphadenopathy: There is no lymphadenopathy about the head or neck.  Neurological: Alert and oriented to person, place, and time. No cranial nerve deficit. Normal muscle tone. Normal coordination.  Skin: Skin is warm and dry. No pallor. No rash noted.   Psychiatric: Normal mood, affect and behavior. Judgment and thought content normal.

## 2023-04-12 NOTE — Unmapped (Signed)
Spoke with patient's wife on 04/10/2023.  Patient's wife has a recent illness going about a week.  Patient has recently begun to get symptoms with cough nasal congestion rhinorrhea.  No home testing as of yet.  No additional symptoms of concern.  Pending weekend.    Respiratory symptoms suggestive of illness cannot exclude virus versus bacterial.  Standard education guidance.  Conservative measures reviewed.  Prescription for ipratropium nasal spray, Tessalon Perles for cough prescribed.  If not improving as expected may begin azithromycin as prescribed.    Precautions reviewed.    Patient's wife voiced understanding agree with plan of care.    Barron Alvine, MD  [facility] Primary Care at [facility]  [address], [address]  [place], [name] [address]  Phone [phone]

## 2023-04-21 NOTE — Unmapped (Signed)
[facility] Refill Coordination Note    Specialty Lite Medication(s) to be Shipped:   Repatha    Other medication(s) to be shipped: No additional medications requested for fill at this time     Tarif Mcmanaway, DOB: 1946/03/05  Phone: [phone] (home)       All above HIPAA information was verified with patient's family member, [name].     Was a Nurse, learning disability used for this call? No    Changes to medications: Nekko reports no changes at this time.  Changes to insurance: No      REFERRAL TO PHARMACIST     Referral to the pharmacist: Not needed      [facility]     Shipping address confirmed in Epic.     Delivery Scheduled: Yes, Expected medication delivery date: 04/23/23.     Medication will be delivered via Same Day Courier to the prescription address in Epic WAM.    [name]   [facility] Specialty Technician

## 2023-04-23 MED FILL — REPATHA SURECLICK 140 MG/ML SUBCUTANEOUS PEN INJECTOR: SUBCUTANEOUS | 28 days supply | Qty: 2 | Fill #10

## 2023-05-02 DIAGNOSIS — J310 Chronic rhinitis: Principal | ICD-10-CM

## 2023-05-02 MED ORDER — IPRATROPIUM BROMIDE 21 MCG (0.03 %) NASAL SPRAY
1 refills | 0.00 days
Start: 2023-05-02 — End: ?

## 2023-05-04 MED ORDER — IPRATROPIUM BROMIDE 21 MCG (0.03 %) NASAL SPRAY
1 refills | 0.00 days | Status: CP
Start: 2023-05-04 — End: ?

## 2023-05-10 DIAGNOSIS — G4700 Insomnia, unspecified: Principal | ICD-10-CM

## 2023-05-10 MED ORDER — METOPROLOL SUCCINATE ER 100 MG TABLET,EXTENDED RELEASE 24 HR
ORAL_TABLET | Freq: Every day | ORAL | 1 refills | 0.00 days
Start: 2023-05-10 — End: ?

## 2023-05-10 MED ORDER — TRAZODONE 50 MG TABLET
ORAL_TABLET | 1 refills | 0.00 days
Start: 2023-05-10 — End: ?

## 2023-05-11 NOTE — Unmapped (Signed)
Patient is requesting the following refill  Requested Prescriptions     Pending Prescriptions Disp Refills    traZODone (DESYREL) 50 MG tablet [Pharmacy Med Name: TRAZODONE 50 MG TABLET] 180 tablet 1     Sig: TAKE 2 TABLETS BY MOUTH EVERY DAY AT NIGHT    metoPROLOL succinate (TOPROL-XL) 100 MG 24 hr tablet [Pharmacy Med Name: METOPROLOL SUCC ER 100 MG TAB] 180 tablet 1     Sig: TAKE 2 TABLETS BY MOUTH EVERY DAY       Recent Visits  Date Type Provider Dept   04/06/23 Office Visit [name], MD [place] [facility] At [facility]   03/11/23 Office Visit [name], MD [place] [facility] At [facility]   01/29/23 Office Visit [name], MD [facility] Cardiology Primary Care [facility]   12/10/22 Office Visit [name], MD [place] [facility] At [facility]   11/10/22 Office Visit [name], MD [place] Primary [facility] At [facility]   10/29/22 Office Visit [name], MD [facility] Primary [facility]   10/07/22 Office Visit Semerzier, Thalia Party, RN [place] Primary Care At [facility]   10/07/22 Office Visit [name], MD [place] Primary Care At [facility]   09/08/22 Office Visit [name], MD [place] Primary Care At [facility]   08/14/22 Office Visit [name], MD [place] Cardiology Primary [facility]   Showing recent visits within past 365 days and meeting all other requirements  Future Appointments  Date Type Provider Dept   06/01/23 Appointment [name], MD [facility] Cardiology Primary Care [facility]   06/09/23 Appointment [name], MD [place] [facility] At [facility]   Showing future appointments within next 365 days and meeting all other requirements

## 2023-05-12 MED ORDER — TRAZODONE 50 MG TABLET
ORAL_TABLET | 1 refills | 0.00 days | Status: CP
Start: 2023-05-12 — End: ?

## 2023-05-12 MED ORDER — METOPROLOL SUCCINATE ER 100 MG TABLET,EXTENDED RELEASE 24 HR
ORAL_TABLET | Freq: Every day | ORAL | 1 refills | 90.00 days | Status: CP
Start: 2023-05-12 — End: ?

## 2023-05-13 DIAGNOSIS — R413 Other amnesia: Principal | ICD-10-CM

## 2023-05-13 MED ORDER — DONEPEZIL 5 MG TABLET
ORAL_TABLET | Freq: Every evening | ORAL | 1 refills | 0.00 days
Start: 2023-05-13 — End: ?

## 2023-05-13 NOTE — Unmapped (Signed)
Patient is requesting the following refill  Requested Prescriptions     Pending Prescriptions Disp Refills    donepezil (ARICEPT) 5 MG tablet [Pharmacy Med Name: DONEPEZIL HCL 5 MG TABLET] 90 tablet 1     Sig: TAKE 1 TABLET BY MOUTH EVERY DAY AT NIGHT       Recent Visits  Date Type Provider Dept   04/06/23 Office Visit [name], MD [place] [facility] At [facility]   03/11/23 Office Visit [name], MD [place] [facility] At [facility]   12/10/22 Office Visit [name], MD [place] Primary [facility] At [facility]   11/10/22 Office Visit [name], MD [place] Primary Care At [facility]   10/07/22 Office Visit Semerzier, Thalia Party, RN [place] Primary Care At [facility]   10/07/22 Office Visit [name], MD [place] [facility] At [facility]   09/08/22 Office Visit [name], MD [place] Primary Care At [facility]   07/30/22 Office Visit [name], MD [place] Primary Care At [facility]   Showing recent visits within past 365 days and meeting all other requirements  Future Appointments  Date Type Provider Dept   06/09/23 Appointment [name], MD [place] Primary Care At [facility]   Showing future appointments within next 365 days and meeting all other requirements

## 2023-05-14 MED ORDER — DONEPEZIL 5 MG TABLET
ORAL_TABLET | Freq: Every evening | ORAL | 1 refills | 90.00 days | Status: CP
Start: 2023-05-14 — End: ?

## 2023-05-18 ENCOUNTER — Ambulatory Visit: Admit: 2023-05-18 | Discharge: 2023-05-19 | Payer: MEDICARE

## 2023-05-18 DIAGNOSIS — R32 Unspecified urinary incontinence: Principal | ICD-10-CM

## 2023-05-18 NOTE — Unmapped (Signed)
Procedure:  Local cystourethroscopy    Code: Cystoscopy, CPT 52000      Indication: 78 y.o. male here for prostate evaluation.    Description:   Time out was performed immediately prior to the procedure.     The patient was prepped and draped in the usual sterile fashion in the supine position. Flexible cystoscopy was performed.   The urethral meatus and urethra were normal.   Appropriately coapting sphincter.  The prostate was bilobar with mild/moderate coaptation. There was intravesical protrusion.  The bladder had no evidence of tumors. There was minor trabeculation.  Ureteral orifices were visualized bilaterally.   The patient tolerated the procedure well, and he did not require prophylactic antibiotics according to AUA guidelines.Marland Kitchen    Specimen: none    Assessment:  Normal cystoscopy with mild/moderate obstructive anatomy    Plan:    - Continue silodosin- he reports his incontinence has resolved after this  - Likely obstructive symptoms.  - f/u in 6 months    Ulyses Southward, MD  Assistant Professor  Department of Urology

## 2023-05-23 MED ORDER — ISOSORBIDE MONONITRATE ER 60 MG TABLET,EXTENDED RELEASE 24 HR
ORAL_TABLET | Freq: Every day | ORAL | 3 refills | 0.00 days
Start: 2023-05-23 — End: ?

## 2023-05-25 MED ORDER — ISOSORBIDE MONONITRATE ER 60 MG TABLET,EXTENDED RELEASE 24 HR
ORAL_TABLET | Freq: Every day | ORAL | 3 refills | 90.00 days | Status: CP
Start: 2023-05-25 — End: ?

## 2023-05-25 NOTE — Unmapped (Signed)
 Patient is requesting the following refill  Requested Prescriptions     Pending Prescriptions Disp Refills    isosorbide mononitrate (IMDUR) 60 MG 24 hr tablet [Pharmacy Med Name: Junious Dresser ER 60 MG TB] 90 tablet 3     Sig: TAKE 1 TABLET BY MOUTH EVERY DAY       Recent Visits  Date Type Provider Dept   04/06/23 Office Visit [name], MD [place] Primary Care At [facility]   03/11/23 Office Visit [name], MD [place] [facility] At [facility]   01/29/23 Office Visit [name], MD [facility]   12/10/22 Office Visit [name], MD [place] [facility] At [facility]   11/10/22 Office Visit [name], MD [place] [facility] At [facility]   10/29/22 Office Visit [name], MD [name] Primary Care [facility]   10/07/22 Office Visit Semerzier, Thalia Party, RN [place] Primary Care At [facility]   10/07/22 Office Visit [name], MD [place] Primary Care At [facility]   09/08/22 Office Visit [name], MD [place] Primary [facility] At [facility]   08/14/22 Office Visit [name], MD  Cardiology Primary [facility]   Showing recent visits within past 365 days and meeting all other requirements  Future Appointments  Date Type Provider Dept   06/01/23 Appointment [name], MD [facility] Cardiology Primary Care [facility]   06/09/23 Appointment [name], MD  Primary Care At [facility]   Showing future appointments within next 365 days and meeting all other requirements

## 2023-05-27 DIAGNOSIS — J310 Chronic rhinitis: Principal | ICD-10-CM

## 2023-05-27 MED ORDER — IPRATROPIUM BROMIDE 21 MCG (0.03 %) NASAL SPRAY
1 refills | 0.00 days | Status: CP
Start: 2023-05-27 — End: ?

## 2023-05-27 NOTE — Unmapped (Signed)
 Patient is requesting the following refill  Requested Prescriptions     Pending Prescriptions Disp Refills    ipratropium (ATROVENT) 21 mcg (0.03 %) nasal spray [Pharmacy Med Name: IPRATROPIUM 0.03% SPRAY]  1     Sig: SPRAY 2 SPRAYS INTO EACH NOSTRIL 3 TIMES A DAY       Recent Visits  Date Type Provider Dept   04/06/23 Office Visit [name], MD [place] Primary Care At [facility]   03/11/23 Office Visit [name], MD [place] Primary [facility] At [facility]   12/10/22 Office Visit [name], MD [place] [facility] At [facility]   11/10/22 Office Visit [name], MD [place] Primary Care At [facility]   10/07/22 Office Visit Semerzier, Thalia Party, RN [place] Primary Care At [facility]   10/07/22 Office Visit [name], MD [place] Primary Care At [facility]   09/08/22 Office Visit [name], MD [place] Primary Care At [facility]   07/30/22 Office Visit [name], MD [place] Primary Care At [facility]   Showing recent visits within past 365 days and meeting all other requirements  Future Appointments  Date Type Provider Dept   06/09/23 Appointment [name], MD  Primary Care At [facility]   Showing future appointments within next 365 days and meeting all other requirements       Labs:

## 2023-06-01 ENCOUNTER — Ambulatory Visit: Admit: 2023-06-01 | Discharge: 2023-06-02 | Payer: MEDICARE | Attending: Internal Medicine | Primary: Internal Medicine

## 2023-06-01 DIAGNOSIS — Z789 Other specified health status: Principal | ICD-10-CM

## 2023-06-01 DIAGNOSIS — E1169 Type 2 diabetes mellitus with other specified complication: Principal | ICD-10-CM

## 2023-06-01 DIAGNOSIS — I240 Acute coronary thrombosis not resulting in myocardial infarction: Principal | ICD-10-CM

## 2023-06-01 DIAGNOSIS — M791 Myalgia, unspecified site: Principal | ICD-10-CM

## 2023-06-01 DIAGNOSIS — I701 Atherosclerosis of renal artery: Principal | ICD-10-CM

## 2023-06-01 DIAGNOSIS — I2089 Chronic stable angina (CMS-HCC): Principal | ICD-10-CM

## 2023-06-01 DIAGNOSIS — E785 Hyperlipidemia, unspecified: Principal | ICD-10-CM

## 2023-06-01 DIAGNOSIS — I259 Chronic ischemic heart disease, unspecified: Principal | ICD-10-CM

## 2023-06-01 DIAGNOSIS — E1159 Type 2 diabetes mellitus with other circulatory complications: Principal | ICD-10-CM

## 2023-06-01 DIAGNOSIS — I771 Stricture of artery: Principal | ICD-10-CM

## 2023-06-01 DIAGNOSIS — I152 Hypertension secondary to endocrine disorders: Principal | ICD-10-CM

## 2023-06-01 DIAGNOSIS — T466X5A Adverse effect of antihyperlipidemic and antiarteriosclerotic drugs, initial encounter: Principal | ICD-10-CM

## 2023-06-01 DIAGNOSIS — I480 Paroxysmal atrial fibrillation: Principal | ICD-10-CM

## 2023-06-01 MED ORDER — REPATHA SURECLICK 140 MG/ML SUBCUTANEOUS PEN INJECTOR
SUBCUTANEOUS | 11 refills | 28.00 days | Status: CP
Start: 2023-06-01 — End: ?
  Filled 2023-06-05: qty 2, 28d supply, fill #0

## 2023-06-01 NOTE — Unmapped (Addendum)
 I sent the Repatha to [facility] [phone] . They should be able to help sort this out.    If you are interested in doing cardiac rehab again let me know.    See me in 4 months    [name], MD, PhD  [facility]  Cardiology

## 2023-06-01 NOTE — Unmapped (Signed)
 [facility] CARDIOLOGY    [facility]  [address]  [place], [name] [address]  Ph: [phone]  Fax: [phone] [facility]  [address], [address]  [place], [name] [address]   Ph: [phone]  Fax: [phone]     Date of Service: 06/01/2023    PCP: [name], MD  Referring Provider: [name], PA    ASSESSMENT and PLAN:     Jesus Rubio is a 78 y.o. male presenting at the request of [name], PA for evaluation and management of:  1. Chronic stable angina (CMS-HCC)    2. 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 Hypertension associated with diabetes (CMS-HCC)    4. Hyperlipidemia associated with type 2 diabetes mellitus (CMS-HCC)    5. Paroxysmal atrial fibrillation (CMS-HCC)    6. Renal artery stenosis (CMS-HCC)    7. Subclavian arterial stenosis (CMS-HCC)    8. Myalgia due to statin    9. Statin intolerance    10. Type 2 diabetes mellitus with other specified complication, without long-term current use of insulin (CMS-HCC)      Jesus Rubio presents in follow up.  I last saw him 01/2023. His angina is stable. He uses nitroglycerin once or twice a month. He has DOE and fatigue but no significant change from prior. We reviewed his known cardiac history. He has severe CAD that cannot be readily revascularized (CTO of the left main, CTO of the LAD).     1. 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 NSTEMI while hospitalized for COVID, managed medically with plavix. Plavix was later discontinued in favor of eliquis due to PE  - anti-platelets: plavix 6 months post PCI renal artery stenosis => switch to aspirin 12/2021  - anti-anginal: metoprolol succinate 100mg  BID, imdur 60mg  daily  - statin: atorvastatin 40mg  daily => 10mg  ?muscle aches on higher dose => crestor 20mg  daily => Repatha, stop crestor  - last echo: 05/12/20 normal EF  - last stress/angiography:04/2021 see below. New since 2016 is CTO left main    2. Afib and Atrial flutter s/p ablation x2. Recurrence of paroxysmal Afib during hospitalization for COVID19 03/2020-04/2020. ER visit 02/2021 for symptomatic afib. No clear trigger.  - CHADS2-Vasc score: 6 (HTN, age+2, PE and CVA+2, IHD)  - Anti-coagulation: Eliquis  - Rate control: metoprolol as above, additional 50mg  prn dose if needed for sustained palpitations  - Rhythm control: not indicated    3. COVID19 with lengthy, complicated hospitalization 03/2020-04/2020. ARDS, NSTEMI, GI bleed, ARF requiring dialysis, occipital lobe CVA, PE  - has SOB with exertion, stable    4. History of PE  - on eliquis    5. R subclavian stenosis  - BP in left arm    6. Renal artery stenosis s/p R renal artery DES 05/2021  - plavix for 6 months    Follow-up: Return in about 4 months (around 09/29/2023).     [name], MD, PhD  [facility] Cardiology      ORDERS THIS VISIT:     No orders of the defined types were placed in this encounter.     New Prescriptions    No medications on file      Modified Medications    Modified Medication Previous Medication    EVOLOCUMAB (REPATHA SURECLICK) 140 MG/ML PNIJ evolocumab (REPATHA SURECLICK) 140 mg/mL PnIj       Inject the contents  of 1 pen (140 mg total) under the skin every fourteen (14) days.    Inject the contents of 1 pen (140 mg total) under the skin every fourteen (14) days.      Discontinued Medications    No medications on file        SUBJECTIVE:     ID: Jesus Rubio is a 78 y.o. male with a history of CAD s/p CABG, aflutter s/p ablation, COVID19 with numerous complications during hospitalization 03/2020-04/2020    Reason for Visit: Follow-up     Cardiovascular History:    Previously evaluated by [name] and last saw him 05/2019. He has a history of CABG and aflutter s/p ablation. Ziopatch 06/2019 did not show recurrence of aflutter. Prolonged hospitalization at [facility] from 03/17/20 to 04/17/20 for COVID19 with hypoxic respiratory failure due to ARDS, bilateral subsegmental PE, GI bleed requiring transfusion, NSTEMI, afib, L occipital lobe CVA.    Presented to [facility] ER 07/29/20 after an episode of syncope. ECG showed sinus rhythm with RBBB. A ziopatch was ordered. Ziopatch showed 16 short SVT episodes, appear to be atrial tach.    12/2020 right sided rib fractures    02/26/21 ER visit for afib with RVR and HTN. Prescribed prn diltiazem by ER in addition to BID metoprolol.    04/2021 PET stress for evaluation of CP, showing large reversible apical/anterior/septal defect. Referred for [facility]): interval proximal occlusion of the left main, LIMA is patent but mid LAD occlusion prevents backfilling of the proximal LAD. Unsuccessful attempt to pass a wire through CTO left main. All grafts patent. RAS 70% on the R. Also found to have differential of >20mmHg SBP/DBP between L and R arm.     05/2021 reviewed case with [name]. Plan for treatment of RAS. Do not think coronary intervention will be worth the risk. S/p DES to right renal artery. Angiography also showed right subclavian stenosis.    Interval History:    Biggest concern is low energy - not new.    Not too much CP. Has used ntg 5 times. Has palpitations      Review of Systems  10 systems were reviewed and negative except as noted in HPI.    Cardiovascular History and Pertinent Past Medical History:  CAD s/p CABG   Aflutter s/p ablation  Afib  COVID19 03/2020 with prolonged hospitalization  Pulmonary embolism 03/2020  CVA 03/2020  RAS s/p R renal artery PCI 05/2021  R subclavian stenosis     has a past medical history of Acute hypoxemic respiratory failure (CMS-HCC) (03/17/2020), Alcoholism (CMS-HCC), Anxiety, Arthritis, At risk for falls, B12 deficiency, CHF (congestive heart failure) (CMS-HCC), Coronary artery disease, Diabetes mellitus (CMS-HCC), Financial difficulties, Hearing impairment, Heart disease, Hyperlipemia, Hypertension, Joint pain, Major depressive disorder, Memory changes (10/10/2022), Peptic ulceration, PONV (postoperative nausea and vomiting), Renal disorder, and Stage 3a chronic kidney disease (CMS-HCC) (07/11/2014).    Prior Cardiovascular Interventions / Surgery:  CABG 2006 LIMA-LAD,  SVG-OM, SVG-PDA  Aflutter ablation 2016, and 2017  R renal artery PCI 05/2021 [facility]    Prior Cardiovascular Diagnostics:    ECG   06/28/20 sinus rhythm 77bpm, RBBB with non-specific ST/TW abnormalities  02/26/21 afib 120bpm, RBBB  03/05/21 sinus rhythm with PACs, RBBB  04/04/21 sinus rhythm with PACs, RBBB  07/04/21 sinus rhythm, RBBB    Echo 04/11/20 EF normal  Echo 12/2020 normal EF, normal RV  Echo 10/2021 normal EF    Ziopatch 07/2020  - 16 short SVT episodes were  recorded, the longest lasting 7.7 seconds. These appear to be atrial tachycardia.   - Otherwise unremarkable 14 day ambulatory cardiac monitor without patient triggered events.     PET Stress 04/2021  - Abnormal myocardial perfusion study  - There is a large in size, moderately severe, completely reversible perfusion defect involving the apical, apical anterior, mid anterior, basal anterior, apical septal, mid anteroseptal, basal anteroseptal, mid inferoseptal and basal inferoseptal segments. This is consistent with probable ischemia.  - Left ventricular systolic function is normal. Post stress the ejection fraction is > 60%.  - Coronary and aortic calcifications are noted  - Aortic valve calcifications are noted  - Attenuation CT scan shows post CABG findings  - Status post cholecystectomy  - Nonspecific bilateral reticular changes in the lungs, including areas of atelectasis    LHC 05/04/21  Small area of lateral wall hypokinesis with preserved LV systolic function.  Coronary artery disease including proximal occlusion of the left main and long 70% lesion in the mid-RCA.  Prior coronary bypass surgery with patent LIMA to distal LAD, patent SVG to OM1 and OM2 and patent SVG to PDA.   70% right renal artery stenosis. Widely patent left renal artery.  Interval change since angiography on 07/14/14 was total occlusion of left main. Proximal LAD was now supplied by collaterals from PDA since occlusion of mid-LAD (present in 2016) prevented backfilling of the proximal LAD from LIMA to LAD graft.  Unsuccessful attempt to pass a wire through totally occluded left main.      Other Surgical History:   has a past surgical history that includes Replacement total knee; Cholecystectomy; Thyroidectomy, partial; Prostate surgery; Foot arthrodesis, triple; Cardiac surgery (2006); Joint replacement; pr upper gi endoscopy,biopsy (N/A, 12/01/2013); pr compre ep eval abltj 3d mapg tx svt (N/A, 05/16/2015); Knee arthroscopy; pr colonoscopy flx dx w/collj spec when pfrmd (N/A, 04/03/2020); pr gi imag intraluminal esophagus-ileum w/i&r (N/A, 04/05/2020); pr cath place/coron angio, img super/interp,w left heart ventriculography (N/A, 05/03/2021); and pr revascularize fem/pop artery,angioplasty/stent (N/A, 06/03/2021).    Current Outpatient Medications   Medication Instructions    acetaminophen (TYLENOL) 650 mg, Oral, Every 6 hours PRN    albuterol HFA 90 mcg/actuation inhaler 2 puffs, Inhalation, Every 6 hours PRN    aspirin (ECOTRIN) 81 mg, Oral, Daily (standard)    azithromycin (ZITHROMAX) 250 MG tablet 500 mg by mouth x ONE day. Then 250 mg by mouth daily for FOUR days. HOLD donepezil, while taking.    benzonatate (TESSALON) 200 mg, Oral, 3 times a day PRN    blood sugar diagnostic (ONETOUCH VERIO TEST STRIPS) Strp USE TO CHECK BLOOD SUGAR 1-2 TIMES PER DAY    blood-glucose meter kit Check BS every day    cyanocobalamin (vitamin B-12) 1,000 mcg, Subcutaneous, Every 30 days    dilTIAZem (CARDIZEM) 30 MG tablet 1 tab Q3 hr PRN bad palpitations, HR>110.  Be sure top number blood pressure over 95.    donepezil (ARICEPT) 5 mg, Oral, Nightly    ELIQUIS 5 mg, Oral, 2 times a day (standard)    empty container (SHARPS-A-GATOR DISPOSAL SYSTEM) Misc Use as directed for sharps disposal    ergocalciferol-1,250 mcg (50,000 unit) (DRISDOL) 1,250 mcg, Oral, Weekly    evolocumab (REPATHA SURECLICK) 140 mg/mL PnIj Inject the contents of 1 pen (140 mg total) under the skin every fourteen (14) days.    fluticasone propionate (FLONASE) 50 mcg/actuation nasal spray 2 sprays, Each Nare, Daily (standard)    gabapentin (NEURONTIN) 100 mg, Oral, 3  times a day (standard)    gentamicin (GARAMYCIN) 0.1 % cream 1 Application    ipratropium (ATROVENT) 21 mcg (0.03 %) nasal spray SPRAY 2 SPRAYS INTO EACH NOSTRIL 3 TIMES A DAY    isosorbide mononitrate (IMDUR) 60 mg, Oral, Daily (standard)    metoPROLOL succinate (TOPROL-XL) 200 mg, Oral, Daily (standard)    miscellaneous medical supply Misc Glucometer  Dx: E11.9    miscellaneous medical supply Misc Glucometer test strips   Dx: E11.9    miscellaneous medical supply Misc Lancets  Dx  E11.9    nitroglycerin (NITROLINGUAL) 0.4 mg/dose spray 1 spray, Every 5 min PRN    nystatin (MYCOSTATIN) 100,000 unit/gram cream Topical, 3 times a day (standard)    pramoxine 1 % Crea Apply up to three times daily. Limit to 7 days consistent use.    silodosin 4 mg, Oral, Daily    sucralfate (CARAFATE) 1 g, Oral, 3 times a day (standard), For stomach protection due to anemia / history of gastritis.    traZODone (DESYREL) 50 MG tablet TAKE 2 TABLETS BY MOUTH EVERY DAY AT NIGHT      Allergies:  is allergic to escitalopram oxalate, ace inhibitors, escitalopram, and statins-hmg-coa reductase inhibitors.    Social History:  He  reports that he quit smoking about 51 years ago. His smoking use included cigarettes. He started smoking about 66 years ago. He has a 15 pack-year smoking history. He has been exposed to tobacco smoke. He has never used smokeless tobacco. He reports that he does not drink alcohol and does not use drugs.    Family History:  His family history includes Cancer in his brother, brother, brother, father, mother, sister, and sister; Depression in his mother; Heart attack in his brother, father, and mother; Lung cancer in his brother.       OBJECTIVE:      BP 144/78 (BP Site: L Arm, BP Position: Sitting, BP Cuff Size: Large)  - Pulse 64  - Temp 36.9 ??C (98.4 ??F) (Temporal)  - Resp 18  - Ht 182.9 cm (6' 0.01)  - Wt (!) 100.8 kg (222 lb 3.2 oz)  - SpO2 95%  - BMI 30.13 kg/m??      Wt Readings from Last 12 Encounters:   06/01/23 (!) 100.8 kg (222 lb 3.2 oz)   05/18/23 98.4 kg (217 lb)   04/06/23 99.8 kg (220 lb)   03/16/23 100.2 kg (221 lb)   03/11/23 100.2 kg (221 lb)   01/29/23 96.8 kg (213 lb 6.4 oz)   12/10/22 98.7 kg (217 lb 8 oz)   11/10/22 97.8 kg (215 lb 8 oz)   10/29/22 97.9 kg (215 lb 12.8 oz)   10/07/22 98.9 kg (218 lb)   10/07/22 98.9 kg (218 lb)   09/08/22 (!) 101.6 kg (224 lb)     BP Readings from Last 5 Encounters:   06/01/23 144/78   04/06/23 130/78   03/11/23 124/72   01/29/23 128/64   12/10/22 118/78     Pulse Readings from Last 3 Encounters:   06/01/23 64   05/18/23 102   04/06/23 66       General:  Alert, no distress.   Eyes:  EOMI, sclerae anicteric   Neck: No carotid bruit. JVP is not visible sitting upright   Resp:   CTAB bilaterally with normal WOB.   CV:  RRR, normal s1 and s2, no murmurs, normal radial pulses b/l, mild LE edema   GI:   Abdomen soft, non-tender  MSK: Normal muscle tone, no joint swelling/effusion   Skin: No lesions/rashes.   Neuro: No focal deficits.   Psych: Normal mood, normal affect     Lab Results   Component Value Date    HGB 14.7 03/11/2023    HGB 14.2 01/29/2023    HGB 13.6 10/08/2022    PLT 285 03/11/2023    PLT 275 01/29/2023    PLT 263 10/08/2022     Lab Results   Component Value Date    CREATININE 1.40 (H) 03/11/2023    CREATININE 1.40 (H) 01/29/2023    CREATININE 1.20 10/08/2022    K 4.7 03/11/2023    K 5.3 (H) 01/29/2023    K 4.3 10/08/2022      Lab Results   Component Value Date    BNP 408 (H) 03/18/2020     Lab Results   Component Value Date    PROBNP 541.0 (H) 01/16/2022    PROBNP 281.0 04/05/2021    PROBNP 3,890.0 (H) 02/26/2021     Lab Results Component Value Date    CHOL 105 10/08/2022    CHOL 126 04/28/2022    LDL 24 (L) 10/08/2022    LDL 33 (L) 04/28/2022    HDL 52 10/08/2022    HDL 44 04/28/2022    TRIG 161 10/08/2022    TRIG 244 (H) 04/28/2022     Lab Results   Component Value Date    A1C 5.8 03/11/2023

## 2023-06-03 NOTE — Unmapped (Signed)
 [facility]ty and Home Delivery Pharmacy Refill Coordination Note    Specialty Lite Medication(s) to be Shipped:   Repatha    Other medication(s) to be shipped: No additional medications requested for fill at this time     Jesus Rubio, DOB: 12/31/1945  Phone: [phone] (home)       All above HIPAA information was verified with patient.     Was a Nurse, learning disability used for this call? No    Changes to medications: [name] reports no changes at this time.  Changes to insurance: No      REFERRAL TO PHARMACIST     Referral to the pharmacist: Not needed      [facility]     Shipping address confirmed in Epic.     Delivery Scheduled: Yes, Expected medication delivery date: 06/05/2023.     Medication will be delivered via Same Day Courier to the prescription address in Epic WAM.    Thad Ranger, PharmD   [facility] Delivery Pharm[facility] Specialty Pharmacist

## 2023-06-09 ENCOUNTER — Ambulatory Visit: Admit: 2023-06-09 | Discharge: 2023-06-10 | Payer: MEDICARE | Attending: Family Medicine | Primary: Family Medicine

## 2023-06-09 DIAGNOSIS — R413 Other amnesia: Principal | ICD-10-CM

## 2023-06-09 DIAGNOSIS — J849 Interstitial pulmonary disease, unspecified: Principal | ICD-10-CM

## 2023-06-09 DIAGNOSIS — N1831 Stage 3a chronic kidney disease (CMS-HCC): Principal | ICD-10-CM

## 2023-06-09 DIAGNOSIS — N1832 Type 2 diabetes mellitus with stage 3b chronic kidney disease, without long-term current use of insulin (CMS-HCC): Principal | ICD-10-CM

## 2023-06-09 DIAGNOSIS — I2510 Atherosclerotic heart disease of native coronary artery without angina pectoris: Principal | ICD-10-CM

## 2023-06-09 DIAGNOSIS — E1122 Type 2 diabetes mellitus with diabetic chronic kidney disease: Principal | ICD-10-CM

## 2023-06-09 DIAGNOSIS — Z8673 Personal history of transient ischemic attack (TIA), and cerebral infarction without residual deficits: Principal | ICD-10-CM

## 2023-06-09 MED ORDER — NITROGLYCERIN 400 MCG/SPRAY TRANSLINGUAL
SUBLINGUAL | 0 refills | 105.00 days | Status: CP | PRN
Start: 2023-06-09 — End: 2023-06-09

## 2023-06-09 MED ORDER — NITROGLYCERIN 0.4 MG SUBLINGUAL TABLET
ORAL_TABLET | SUBLINGUAL | 1 refills | 1.00 days | Status: CP | PRN
Start: 2023-06-09 — End: 2024-06-08

## 2023-06-09 NOTE — Unmapped (Signed)
 Health Maintenance Due   Topic Date Due    Zoster Vaccines (1 of 2) Never done    DTaP/Tdap/Td Vaccines (1 - Tdap) 04/08/2002    COVID-19 Vaccine (1 - 2024-25 season) Never done    Influenza Vaccine (1) 12/07/2022           Goals & Recommendations:    Goals and Recommendations related to your health are outlined below.    If discussed and provided at your visit today: Please, keep and record your blood sugar, blood pressure, weight, fluid intake, dietary intake and exercise amount and frequency.     Blood Pressure Goals (unless otherwise instructed):  Check 2-3 times a week.  Typical average out of office measurement: <135 mmHg systolic and < 85 mmHg diastolic (less than 135/85).  More strict recommendations for out of office measurement: < 130 mmHg systolic and < 80 mmHg diastolic (less than 130/80).  Typical in office measurement <140 mmHg systolic and <90 mmHg diastolic (less than 140/90).  Most people will not feel well if blood pressure drops to less than 110/70, unless they typically have blood pressures in this range.   If you are noticing sudden increases or decreases in blood pressure, then please contact the office.    Diabetes: Goal Hemoglobin A1c 7.9 or lower.  Goal Blood Sugars Fasting: 60-140 (may check fasting and two hours after a meal later in the day).    For Monitoring your Mood your PHQ-9 Goal is less than 10.    Drink 48 ounces of water per day.  Avoid sweetened beverages.  You will generally feel better and experience improved health when you limit, and if possible avoid, processed foods.  Diets high in plant based foods, vegetables and fruits are associated with many health benefits.    Exercise for a minimum of 20 minutes daily. Minimum of four days a week.   If possible try to obtain 60 minutes of exercise daily for five or more days per week.  Regular movement throughout the day is best.   Walking is a good form of movement.   Squats, lunges, step-ups, push-ups, planks, and arm exercises with or without weights or resistance bands are good forms of exercise.    If your BMI (body mass index) is above 25 then your body is carrying more weight than is typically healthy.  If your BMI (body mass index) is above 25 and you are not achieving the healthy weight goals that you desire, then consider the following:  Keep calories below 2500 calories per day unless otherwise instructed. Remember that all calories are not equal.  Keep a food journal and record accurately.  Read labels and ingredient lists on the products that you consume.  Sometimes we need to adjust your level of physical activity, and or tailor an exercise program more specifically for your needs, just ask and we will come up with a plan.  If you have questions about any of these recommendations, please ask.    If you smoke, stop.  If you have heart failure or severe kidney disease: weigh yourself daily and maintain fluid intake to less than 2 Liters daily unless otherwise instructed.  If you have diabetes you need to see the eye doctor at least every one to two years and have a yearly foot exam.  Set aside time daily for relaxation.  Plan a pleasant activity that you can look forward to and enjoy.  Medication changes are outlined on your After Visit Summary.  Any referrals placed today will be outlined on your After Visit Summary.    If you have questions, please call the office or use MyChart.   If you need a refill on a medication, please contact your pharmacy.  Please, allow at least two business days for responses to refill requests.    Please, bring all medications and when possible original prescription containers to each and every office visit.    If you have been provided and / or requested to record your blood sugars, blood pressures, weight, fluid intake, dietary intake, or exercise, then please bring records to each and every appointment.    When you see other providers for health care, please request that they send information and records related to your visit to our office. This includes vaccinations, mammograms, radiology, procedures, diagnostic testing, lab testing and physical exams or wellness exams that you have outside of the [facility].      [facility]  [address], [address]  [place], [place] [address]  Office: [phone]  Fax: [phone]               If a lab, test, imaging order or referral was placed today it will show on your AVS.    Labs:  Please obtain labs today prior to leaving, unless otherwise instructed.  If you have been asked to obtain labs at a later date, please schedule a Lab Visit appointment.    X-rays (plain films):    Plain film (traditional) x-rays do not require scheduling at [facility] or elsewhere in the [facility] system.  If you had an x-ray ordered today, our office recommends going to get the x-ray performed as soon as you can, unless otherwise instructed.  If you are obtaining X-rays (plain films) outside of the [facility] system, then please verify with your health care provider so that we can assist in coordinating any needed scheduling / appointments.    Referrals (referral for specialty consultation), Imaging (CT, MRI, Ultrasounds, Cardiac Testing, Other Imaging, Spirometry, Sleep Studies):  If there is a number on your AVS today for a location to call, then calling this number is recommended.  If there is not a number to call, then you will be contacted via your listed preferred phone number, text and or MyChart.  If you are signed up for [facility], then some information may be directed to your MyChart account as the first line of contact. For example, [place] may contact you to schedule an appointment for a referral or test first via your MyChart account. If you have signed up previously for a [facility] MyChart account, though you do not currently access or know how to access, then please let a member of our office staff know. If you would like to set up a [facility] MyChart account please see the instructions near the end of your After Visit Summary. If you need assistance with [facility], you may call the [facility] at [phone].   Phone calls to schedule through [facility] may come from [phone] or [phone] area codes.  If you have not heard regarding scheduling of your referral in the next 7 days, then please contact our office at [phone] option 5.    Insurance Specific Considerations:  Please review information from your insurance provider regarding scheduling and coverage for specialty care, procedures and imaging.  If you know that your insurance specifically requires a referral  or authorization prior to scheduling with a specialty care provider or for a test, please contact our office prior to scheduling at [phone] option 5.    Forms:  We are happy to assist with any forms you may need. We typically complete most forms within 7 business days.  For all forms that you need our office to complete, please review the forms in their entirety first, complete any areas that are required by you including any necessary signatures prior to leaving the forms with our office. Please note that many forms require signatures from patients related to consent for a health care provider or office to complete the form, where required this if often outlined on the forms that you would be submitting. Please also note the time frames for requested return of forms prior to providing to our office as well as any supporting documentation that you may need to provide. The most common reason for delayed completion of forms or return of forms to patients or submissions elsewhere are forms which are not signed by the patient where needed or forms requested for which we do not have supporting documentation or where additional information is needed prior to our completion of the requested forms. Please also note that there are instances beyond our control where forms may not be available within 7 business days.    Refills:  Please allow 3 business days for any refill requests to be processed. Please contact your pharmacy for any refills and to check the status on any refill request. In some instances your insurance may require a prior approval for certain medications. In those cases, refills are processed after we receive approval and this could possibly lengthen the turnaround time.    No Show and Late arrivals:  Please call the office when you need to cancel or reschedule an appointment, not doing so places the appointment as a no-show. If you have 3 or more no shows in a year, you may be asked to locate another provider outside of our office. If you arrive 15 minutes after your appointment time, you may be asked to reschedule.    Phone:  Our phone calls are answered Monday-Friday 8:00 am to 5:00 pm, by a staff member. Calls outside of these times and on weekends, will be answered by a registered nurse with [facility]. If needed, they will reach out to providers on call. Refills are not processed after normal business hours.     Coordination of Care:  When you see other providers for health care, please request that they send information and records related to your visit to our office. This includes vaccinations, mammograms, radiology, procedures, diagnostic testing, lab testing and physical exams or wellness exams that you have outside of the [facility] system. Our contact information is outlined below. Please periodically review your preferred contact information, preferred methods of contact and individuals listed on your contact list in our records. We recommend when possible having at least two phone numbers available for contact. If you have a MyChart account, please access and review your account periodically to update information and check for any new information that may have been sent to you.     Planning for your next visit:  Please bring any needed information to update your insurance, payment records, and preferred contact information.  Updated Medication List including supplements and over the counter medications.  Any information related to health care visits elsewhere since your last visit, including: urgent cares, emergency departments, specialist offices, or  other imaging or procedures.    We Acknowledge:  We acknowledge that while technology has made great progress in allowing [place] to communicate more and has improved some of the connectivity of health care and health care providers, these systems are not perfect. If any questions exist please always ask and if anything is unclear please let us know. In regards to tests, labs, imaging and recommendations for referrals or other procedures from our office, you should always hear something as far as an appointment, a result, a recommendation or a next step.    Billing:  For questions regarding billing please call [phone] option #4.  If you are signed up for [facility], then all billing statements will come to you directly by way of your [facility] MyChart account. Beginning in September 2023, if you are signed up for [facility], you will not receive a mailed paper billing statement from [facility]. If you have signed up previously for a [facility] MyChart account, though you do not currently access or know how to access, then please let a member of our office staff know. If you would like to set up a [facility] MyChart account please see the instructions near the end of your After Visit Summary. If you need assistance with your [facility], you may call the [facility] Out[facility] Access Center at [phone].     Inclement Weather:  During times of inclement weather please call the office to check on any changes in hours of operation.    Survey:  Thank you for the chance to participate in your health care.  Soon you should be receiving a survey about your recent visit to [facility]. Please take a moment to complete this survey.  We take your feedback very seriously and will use it to guide our continuous improvement efforts.     [facility] at [facility]  [address]  [place], [place] [address]  Office: [phone]  Fax: [phone]           Learning About Mindfulness for Stress  What are mindfulness and stress?    Stress is what you feel when you have to handle more than you are used to. A lot of things can cause stress. You may feel stress when you go on a job interview, take a test, or run a race. This kind of short-term stress is normal and even useful. It can help you if you need to work hard or react quickly.  Stress also can last a long time. Long-term stress is caused by stressful situations or events. Examples of long-term stress include long-term health problems, ongoing problems at work, and conflicts in your family. Long-term stress can harm your health.  Mindfulness is a focus only on things happening in the present moment. It's a process of purposefully paying attention to and being aware of your surroundings, your emotions, your thoughts, and how your body feels. You are aware of these things, but you aren't judging these experiences as good or bad. Mindfulness can help you learn to calm your mind and body to help you cope with illness, pain, and stress.  How does mindfulness help to relieve stress?  Mindfulness can help quiet your mind and relax your body. Studies show that it can help some people sleep better, feel less anxious, and bring their blood pressure down. And it's been shown to help some people live and cope better with certain health problems like  heart disease, depression, chronic pain, and cancer.  How do you practice mindfulness?  To be mindful is to pay attention, to be present, and to be accepting.  When you're mindful, you do just one thing and you pay close attention to that one thing. For example, you may sit quietly and notice your emotions or how your food tastes and smells.  When you're present, you focus on the things that are happening right now. You let go of your thoughts about the past and the future. When you dwell on the past or the future, you miss moments that can heal and strengthen you. You may miss moments like hearing a child laugh or seeing a friendly face when you think you're all alone.  When you're accepting, you don't judge the present moment. Instead you accept your thoughts and feelings as they come.  You can practice anytime, anywhere, and in any way you choose. You can practice in many ways. Here are a few ideas:  While doing your chores, like washing the dishes, let your mind focus on what's in your hand. What does the dish feel like? Is the water warm or cold?  Go outside and take a few deep breaths. What is the air like? Is it warm or cold?  When you can, take some time at the start of your day to sit alone and think.  Take a slow walk by yourself. Count your steps while you breathe in and out.  Try yoga breathing exercises, stretches, and poses to strengthen and relax your muscles.  At work, if you can, try to stop for a few moments each hour. Note how your body feels. Let yourself regroup and let your mind settle before you return to what you were doing.  If you struggle with anxiety or worry thoughts, imagine your mind as a blue sky and your worry thoughts as clouds. Now imagine those worry thoughts floating across your mind's sky. Just let them pass by as you watch.  Follow-up care is a key part of your treatment and safety. Be sure to make and go to all appointments, and call your doctor if you are having problems. It's also a good idea to know your test results and keep a list of the medicines you take.  Where can you learn more?  Go to [facility] at [email or web address]  Select Preferences in the upper right hand corner, then select Health Library under Resources. Enter [id number] in the search box to learn more about Learning About Mindfulness for Stress.  Current as of: January 15, 2016  Content Version: 11.6  ?? 2006-2018 [employer]. Care instructions adapted under license by [facility]. If you have questions about a medical condition or this instruction, always ask your healthcare professional. [employer], Inc[employer] disclaims any warranty or liability for your use of this information.         Learning About Guided Imagery for Stress  What are guided imagery and stress?    Stress is what you feel when you have to handle more than you are used to. A lot of things can cause stress. You may feel stress when you go on a job interview, take a test, or run a race. This kind of short-term stress is normal and even useful. It can help you if you need to work hard or react quickly.  Stress also can last a long time. Long-term stress is caused by stressful situations or events. Examples of long-term stress include long-term health  problems, ongoing problems at work, and conflicts in your family. Long-term stress can harm your health.  Guided imagery is a technique of directed thoughts and suggestions that guide your mind toward a relaxed, focused state. This technique helps you use your mind to take you to a calm, peaceful place. You can use it to relax, which can lower blood pressure and reduce other problems related to stress.  How does guided imagery help to relieve stress?  Because of the way the mind and body are connected, guided imagery can make you feel like you are experiencing something just by imagining it. You can achieve a relaxed state when you imagine all the details of a safe, comfortable place, such as a beach or a garden. This relaxed state may help you feel more in control of your emotions and thought processes. Feeling in control may improve your attitude, health, and sense of well-being.  How do you do guided imagery?  You can use a smartphone app or a video to lead you through the process. You use all of your senses in guided imagery. For example, if you want a tropical setting, you can imagine the warm breeze on your skin, the bright blue of the water, the sound of the surf, the sweet scent of tropical flowers, and the taste of coconut. Imagining those things can make you actually feel like you're there.  But you don't have to imagine the tropics to feel peace. Guided imagery can take you to your own restful place. To give guided imagery a try, follow these steps:  Lean back comfortably in your chair. If you can, close your eyes. Put your arms on the armrests, or fold your hands in your lap.  Take a deep breath through your nose. Breathe in slowly, and then let the air out completely through your mouth.  Do it again slowly. Keep breathing like this. Gather up any tension in your body, and send it out with every breath.  Let a feeling of warmth spread from your lungs to your neck and head, down your arms to your fingertips, through your body and into your legs, all the way down to your toes. Stay this way for a moment.  Now imagine a pleasant day. You're at a park or at a place you've visited in the past where you felt at peace.  In your mind's eye, look at what lies in front of you. Maybe you see the sun, filtered through trees. Maybe clouds are drifting by.  Look to one side, and then the other. Notice the feel of the air around you. Notice how it feels on your face and on your arms.  Stay here for a while. Let it become real for you.  Follow-up care is a key part of your treatment and safety. Be sure to make and go to all appointments, and call your doctor if you are having problems. It's also a good idea to know your test results and keep a list of the medicines you take.  Where can you learn more?  Go to [facility] at [email or web address]  Select Preferences in the upper right hand corner, then select Health Library under Resources. Enter N291 in the search box to learn more about Learning About Guided Imagery for Stress.  Current as of: January 15, 2016  Content Version: 11.6  ?? 2006-2018 [employer]. Care instructions adapted under license by [facility]. If you have questions about a medical condition or this instruction, always ask  your healthcare professional. Health[employer], Incorporated disclaims any warranty or liability for your use of this information.        Learning About the Mediterranean Diet  What is the Mediterranean diet?    The Mediterranean diet is a style of eating rather than a diet plan. It features foods eaten in [place], [place], southern [place] and [place], and other countries along the [employer]. It emphasizes eating foods like fish, fruits, vegetables, beans, high-fiber breads and whole grains, nuts, and olive oil. This style of eating includes limited red meat, cheese, and sweets.  Why choose the Mediterranean diet?  A Mediterranean-style diet may improve heart health. It contains more fat than other heart-healthy diets. But the fats are mainly from nuts, unsaturated oils (such as fish oils and olive oil), and certain nut or seed oils (such as canola, soybean, or flaxseed oil). These fats may help protect the heart and blood vessels.  How can you get started on the Mediterranean diet?  Here are some things you can do to switch to a more Mediterranean way of eating.  What to eat  Eat a variety of fruits and vegetables each day, such as grapes, blueberries, tomatoes, broccoli, peppers, figs, olives, spinach, eggplant, beans, lentils, and chickpeas.  Eat a variety of whole-grain foods each day, such as oats, brown rice, and whole wheat bread, pasta, and couscous.  Eat fish at least 2 times a week. Try tuna, salmon, mackerel, lake trout, herring, or sardines.  Eat moderate amounts of low-fat dairy products, such as milk, cheese, or yogurt.  Eat moderate amounts of poultry and eggs.  Choose healthy (unsaturated) fats, such as nuts, olive oil, and certain nut or seed oils like canola, soybean, and flaxseed.  Limit unhealthy (saturated) fats, such as butter, palm oil, and coconut oil. And limit fats found in animal products, such as meat and dairy products made with whole milk. Try to eat red meat only a few times a month in very small amounts.  Limit sweets and desserts to only a few times a week. This includes sugar-sweetened drinks like soda.  The Mediterranean diet may also include red wine with your meal--1 glass each day for women and up to 2 glasses a day for men.  Tips for eating at home  Use herbs, spices, garlic, lemon zest, and citrus juice instead of salt to add flavor to foods.  Add avocado slices to your sandwich instead of bacon.  Have fish for lunch or dinner instead of red meat. Brush the fish with olive oil, and broil or grill it.  Sprinkle your salad with seeds or nuts instead of cheese.  Cook with olive or canola oil instead of butter or oils that are high in saturated fat.  Switch from 2% milk or whole milk to 1% or fat-free milk.  Dip raw vegetables in a vinaigrette dressing or hummus instead of dips made from mayonnaise or sour cream.  Have a piece of fruit for dessert instead of a piece of cake. Try baked apples, or have some dried fruit.  Tips for eating out  Try broiled, grilled, baked, or poached fish instead of having it fried or breaded.  Ask your server to have your meals prepared with olive oil instead of butter.  Order dishes made with marinara sauce or sauces made from olive oil. Avoid sauces made from cream or mayonnaise.  Choose whole-grain breads, whole wheat pasta and pizza crust, brown rice, beans, and lentils.  Cut back on butter or  margarine on bread. Instead, you can dip your bread in a small amount of olive oil.  Ask for a side salad or grilled vegetables instead of french fries or chips.  Where can you learn more?  Go to [facility] [facility] at [email or web address]  [facility] under the Resources menu. Enter O407 in the search box to learn more about Learning About the Mediterranean Diet.  Current as of: February 11, 2017  Content Version: 12.2  ?? 2006-2019 [employer]. Care instructions adapted under license by [facility]. If you have questions about a medical condition or this instruction, always ask your healthcare professional. [employer] disclaims any warranty or liability for your use of this information.            Stretching: Exercises  Your Care Instructions  Here are some examples of exercises for stretching. Start each exercise slowly. Ease off the exercise if you start to have pain.  Your doctor or physical therapist will tell you when you can start these exercises and which ones will work best for you.  How to do the exercises  Latissimus stretch    Stand with your back straight and your feet shoulder-width apart. You can do this stretch sitting down if you are not steady on your feet.  Hold your arms above your head, and hold one hand with the other.  Pull upward while leaning straight over toward your right side. Keep your lower body straight. You should feel the stretch along your left side.  Hold 15 to 30 seconds, and then switch sides.  Repeat 2 to 4 times for each side.  Triceps stretch    Stand with your back straight and your feet shoulder-width apart. You can do this stretch sitting down if you are not steady on your feet.  Bring your left elbow straight up while bending your arm.  Grab your left elbow with your right hand, and pull your left elbow toward your head with light pressure. If you are more flexible, you may pull your arm slightly behind your head. You will feel the stretch along the back of your arm.  Hold 15 to 30 seconds, and then switch elbows.  Repeat 2 to 4 times for each arm.  Calf stretch    Place your hands on a wall for balance. You can also do this with your hands on the back of a chair, a countertop, or a tree.  Step back with your left leg. Keep the leg straight, and press your left heel into the floor.  Press your hips forward, bending your right leg slightly. You will feel the stretch in your left calf.  Hold the stretch 15 to 30 seconds.  Repeat 2 to 4 times for each leg.  Quadriceps stretch    Lie on your side with one hand supporting your head.  Bend your upper leg back and grab your ankle with your other hand.  Stretch your leg back by pulling your foot toward your buttocks. You will feel the stretch in the front of your thigh. If this causes stress on your knees, do not do this stretch.  Hold the stretch 15 to 30 seconds.  Repeat 2 to 4 times for each leg.  Groin stretch    Sit on the floor and put the soles of your feet together. Do not slump your back.  Grab your ankles and gently pull your legs toward you.  Press your knees toward the floor. You will feel the stretch  in your inner thighs.  Hold 15 to 30 seconds.  Repeat 2 to 4 times.  Hamstring stretch in doorway    Lie on the floor near a doorway, with your buttocks close to the wall.  Let the leg you are not stretching extend through the doorway.  Put the leg you want to stretch up on the wall, and straighten your knee to feel a gentle stretch at the back of your leg.  Hold the stretch for at least 15 to 30 seconds. Repeat 2 to 4 times.  Follow-up care is a key part of your treatment and safety. Be sure to make and go to all appointments, and call your doctor if you are having problems. It's also a good idea to know your test results and keep a list of the medicines you take.  Where can you learn more?  Go to [facility] at [email or web address]  Select Preferences in the upper right hand corner, then select Health Library under Resources. Enter P733 in the search box to learn more about Stretching: Exercises.  Current as of: March 13, 2016  Content Version: 11.6  ?? 2006-2018 [employer]. Care instructions adapted under license by [facility]. If you have questions about a medical condition or this instruction, always ask your healthcare professional. [employer] disclaims any warranty or liability for your use of this information.

## 2023-06-13 NOTE — Unmapped (Signed)
 Assessment/Plan     Diagnoses and all orders for this visit:    Type 2 diabetes mellitus with stage 3b chronic kidney disease, without long-term current use of insulin (CMS-HCC)  Stable overall. Well controlled overall. Continue current regimen.    Interstitial pulmonary disease, unspecified (CMS-HCC)  Clinically stable.  Continue current plan of care.    Coronary artery disease involving native coronary artery of native heart, unspecified whether angina present  Clinically asymptomatic.   Standard education and anticipatory guidance regarding goals, objectives, and sequelae.  Continue current management and plan of care.  Continue aggressive risk factor reduction.  -     nitroglycerin (NITROSTAT) 0.4 MG SL tablet; Place 1 tablet (0.4 mg total) under the tongue every five (5) minutes as needed for chest pain.    Stage 3a chronic kidney disease (CMS-HCC)  Clinically asymptomatic.   Standard education and anticipatory guidance regarding goals, objectives, and sequelae.  Continue current management and plan of care.  Continue aggressive risk factor reduction.    Memory changes  Clinically stable.  Continue current plan of care.    History of ischemic stroke  Clinically stable.  Standard education and anticipatory guidance regarding goals, objectives, and sequelae.  Continue current management and plan of care.  Continue aggressive risk factor reduction.      Disposition: Return in about 3 months (around 09/10/2023) for Recheck, DM, Memory.  09/09/2023      Diagnoses and plan along with any newly prescribed medication(s), recommendations, orders and therapies were discussed in detail with patient today.   Standard education and anticipatory guidance regarding goals, objectives and potential sequelae were reviewed. Patient voiced appropriate understanding of assessment and plan of care.    Pertinent handouts were given today and reviewed with the patient as indicated.  The Care Plan and Self-Management goals have been included on the AVS and the AVS has been printed.  Where helpful for monitoring and management I have encouraged the patient to keep regular logs for me to review at their next visit. Any outside resources or referrals needed at this time are noted above. No additional referrals necessary at this time. Patient voiced understanding and all questions have been answered to satisfaction.     To complete this documentation in a timely manner, portions of this encounter may have been completed utilizing Dragon voice recognition software. Please take this into consideration in noting nuances of documentation, grammar, punctuation, and wording. If and when a question exists, or for clarification, please contact the author.    Barron Alvine, MD  [facility]  [address]  [place], [name] [address]  Phone [phone]    Subjective:     NWG:[id number], [name], MD    Jesus Rubio, is a 78 y.o. male    Chief Complaint   Patient presents with    Diabetes     Patient states he not been check blood sugar.        HPI:    Patient presents: Alone.    Nursing notes and history reviewed with patient.    Diabetes mellitus type 2.  Goal A1c less than 8.  No hypo or hyperglycemic symptoms.  Trying to maintain healthy dietary choices.  Denies any diabetic related complications.    Stage III chronic kidney disease.  No urinary tract symptoms, shortness of breath, abdominal swelling, lower extremity edema, orthopnea, pnd.  Compliant with renal protection recommendations.    Interstitial pulmonary disease.  Denies recent  concerning symptoms.  No chest pain concerning cough shortness of breath or dyspnea on exertion.    Coronary artery disease. Compliant with therapy. No chest pain, shortness of breath, orthopnea, PND.  No increase nitroglycerin use.  No bleeding complications from aspirin therapy noted.    Memory changes.  Reports stable decrease in memory without acute changes recently.  Feels more stable with memory than he has in the past.    History of ischemic stroke.  Denies recent strokelike symptoms.    No other complaints.  No additional recent health care visits elsewhere. No urgent care or ED visits.  No additional palliative, provocative, modifying, quantifying, qualifying, or other related factors.  Unless otherwise outlined above patient is pleased with current medication regimen, admits compliance and denies side effects of medication.    The following portions of the patient's history were reviewed and updated as appropriate: allergies, current medications, past family history, past medical history, past social history, past surgical history and problem list.    ROS: Pertinent positives and negatives are as outlined in the HPI above. The remaining balance of the 12 point review of systems is otherwise benign.      PCMH Components:      Goals         <130/80       Lack of exercise or physical activity    Increase exercise or physical activity        Get heart rate stabilized (pt-stated)         Things to think about to help me reach my goal:     What are you going to do? Take medications as prescribed, Keep positive thinking, Pace activities, Stay hydrated   How and how much? Daily   How frequent? Daily   Barriers to success?    Solutions to barriers?      06/04/17: Patient continues to work on this goal   09/29/17 Patient continues to work on this goal   12-09-2017: Reports he continues to work on this and is staying positive.        Take actions to prevent falling       12-03-2016: New goal: Will change positions slowly and pay attention to pathways. Uses cane or walker at times.   09/29/17 Patient continues to work on this goal   12-09-2017: Reports 1 fall 6-8 months ago when he missed a step and fell. Denies injury. Uses cane occasionally.        Weight < 90.7 kg (200 lb)       October 07, 2022 11:24 AM AWV Goal   Things to think about to help me reach my goal:     What are you going to do? Lose weight How and how much? 20 lbs   How frequent? daily   Barriers to success? None identified   Solutions to barriers? N/a                     Family characteristics, patient's social characteristics, patient's cultural characteristics, patient's communication needs, patient's health literacy and behaviors affecting patient's health are outlined under the patient's History Section, Longitudinal Care Plan or Problem List in Epic or as pertinent in History of Present Illness.    Barriers to goals identified and addressed.   See Assessment and Plan for additional pertinent details.    Objective:     Physical Exam   BP 130/70  - Pulse 57  - Temp 36.6 ??C (97.8 ??F) (Oral)  -  Resp 18  - Ht 182.9 cm (6' 0.01)  - Wt 98.9 kg (218 lb)  - SpO2 98%  - BMI 29.56 kg/m??   Constitutional: Oriented to person, place, and time. Appears well-developed and well-nourished. No distress.   Head: Normocephalic and atraumatic.   Mouth/Throat: Oropharynx is without mass or concerning lesions. Moist mucous membranes.    Eyes: Conjunctivae are normal. No scleral icterus.   Neck: Neck supple. Normal range of motion. No JVD present. No thyromegaly present. No concerning neck mass.   Cardiovascular: Normal rate. Regular rhythm. Normal heart sounds. No murmur heard.  Pulmonary/Chest: Effort normal. No respiratory distress. Breath sounds normal. No wheezes. No rhonchi. No rales. No focal breath sound changes. No chest tenderness.  Abdominal: Soft. Non-distended. No abdominal mass. No tenderness. No rebound. No guarding.   Musculoskeletal: No active synovitis. No concerning joint erythema, warmth or swelling.   Range of motion is without evidence of significant impairment.   Strength is without evidence of significant impairment.   No edema of the extremities.   Lymphadenopathy: There is no lymphadenopathy about the head or neck.  Neurological: Alert and oriented to person, place, and time. No cranial nerve deficit. Normal muscle tone. Normal coordination.  Skin: Skin is warm and dry. No pallor. No concerning rash noted.   Psychiatric: Normal mood, affect and behavior. Judgment and thought content normal.

## 2023-06-24 NOTE — Unmapped (Signed)
 [facility] Refill Coordination Note    Specialty Lite Medication(s) to be Shipped:   Repatha    Other medication(s) to be shipped: No additional medications requested for fill at this time     Jesus Rubio, DOB: September 05, 1945  Phone: [phone] (home)       All above HIPAA information was verified with patient.     Was a Nurse, learning disability used for this call? No    Changes to medications: [name] reports no changes at this time.  Changes to insurance: No      REFERRAL TO PHARMACIST     Referral to the pharmacist: Not needed      [name]     Shipping address confirmed in Epic.     Cost and Payment: Patient has a $0 copay, payment information is not required.    Delivery Scheduled: Yes, Expected medication delivery date: 06/30/2023.     Medication will be delivered via Same Day Courier to the prescription address in Epic WAM.    [name]   [facility] Specialty Technician

## 2023-06-30 DIAGNOSIS — Z789 Other specified health status: Principal | ICD-10-CM

## 2023-06-30 DIAGNOSIS — I240 Acute coronary thrombosis not resulting in myocardial infarction: Principal | ICD-10-CM

## 2023-06-30 DIAGNOSIS — E1169 Type 2 diabetes mellitus with other specified complication: Principal | ICD-10-CM

## 2023-06-30 DIAGNOSIS — I259 Chronic ischemic heart disease, unspecified: Principal | ICD-10-CM

## 2023-06-30 NOTE — Unmapped (Signed)
 Jesus Rubio 's Repatha shipment will be delayed as a result of prior authorization being required by the patient's insurance.     I have reached out to the patient  at [phone]  and communicated the delay. We will wait for a call back from the patient to reschedule the delivery.  via same day courier.

## 2023-07-03 NOTE — Unmapped (Signed)
 Jesus Rubio 's Repatha shipment will be delayed as a result of MAPs seeking manufacture assistance.     I have reached out to the patient  at [phone]  and communicated the delay. We will wait for a call back from the patient to reschedule the delivery.  via same day courier.

## 2023-07-06 NOTE — Unmapped (Signed)
 Jesus Rubio 's Repatha shipment will be rescheduled as a result of Repatha grant was approved.     I have reached out to the patient  at [phone]  and communicated the delivery change. We will reschedule the medication for the delivery date that the patient agreed upon.  Expected delivery 07/07/23, via same day courier.

## 2023-07-07 MED FILL — REPATHA SURECLICK 140 MG/ML SUBCUTANEOUS PEN INJECTOR: SUBCUTANEOUS | 84 days supply | Qty: 6 | Fill #1

## 2023-09-09 ENCOUNTER — Ambulatory Visit
Admit: 2023-09-09 | Discharge: 2023-09-10 | Payer: Medicare (Managed Care) | Attending: Family Medicine | Primary: Family Medicine

## 2023-09-09 DIAGNOSIS — E1122 Type 2 diabetes mellitus with diabetic chronic kidney disease: Principal | ICD-10-CM

## 2023-09-09 DIAGNOSIS — N1831 Stage 3a chronic kidney disease: Principal | ICD-10-CM

## 2023-09-09 DIAGNOSIS — I2510 Atherosclerotic heart disease of native coronary artery without angina pectoris: Principal | ICD-10-CM

## 2023-09-09 DIAGNOSIS — J849 Interstitial pulmonary disease, unspecified: Principal | ICD-10-CM

## 2023-09-09 DIAGNOSIS — N1832 Type 2 diabetes mellitus with stage 3b chronic kidney disease, without long-term current use of insulin: Principal | ICD-10-CM

## 2023-09-09 DIAGNOSIS — Z8673 Personal history of transient ischemic attack (TIA), and cerebral infarction without residual deficits: Principal | ICD-10-CM

## 2023-09-09 DIAGNOSIS — R413 Other amnesia: Principal | ICD-10-CM

## 2023-09-09 LAB — VITAMIN B12: VITAMIN B-12: 373 pg/mL (ref 187–1059)

## 2023-09-09 LAB — CBC W/ AUTO DIFF
BASOPHILS ABSOLUTE COUNT: 0.1 10*9/L (ref 0.0–0.1)
BASOPHILS RELATIVE PERCENT: 1.4 %
EOSINOPHILS ABSOLUTE COUNT: 0.3 10*9/L (ref 0.0–0.5)
EOSINOPHILS RELATIVE PERCENT: 3.8 %
HEMATOCRIT: 40.7 % (ref 39.0–48.0)
HEMOGLOBIN: 13.8 g/dL (ref 12.9–16.5)
LYMPHOCYTES ABSOLUTE COUNT: 1.2 10*9/L (ref 1.1–3.6)
LYMPHOCYTES RELATIVE PERCENT: 17.8 %
MEAN CORPUSCULAR HEMOGLOBIN CONC: 33.8 g/dL (ref 32.0–36.0)
MEAN CORPUSCULAR HEMOGLOBIN: 29.6 pg (ref 25.9–32.4)
MEAN CORPUSCULAR VOLUME: 87.5 fL (ref 77.6–95.7)
MEAN PLATELET VOLUME: 9 fL (ref 6.8–10.7)
MONOCYTES ABSOLUTE COUNT: 0.4 10*9/L (ref 0.3–0.8)
MONOCYTES RELATIVE PERCENT: 6.6 %
NEUTROPHILS ABSOLUTE COUNT: 4.7 10*9/L (ref 1.8–7.8)
NEUTROPHILS RELATIVE PERCENT: 70.4 %
NUCLEATED RED BLOOD CELLS: 0 /100{WBCs} (ref <=4)
PLATELET COUNT: 259 10*9/L (ref 150–450)
RED BLOOD CELL COUNT: 4.65 10*12/L (ref 4.26–5.60)
RED CELL DISTRIBUTION WIDTH: 13.4 % (ref 12.2–15.2)
WBC ADJUSTED: 6.7 10*9/L (ref 3.6–11.2)

## 2023-09-09 LAB — IRON & TIBC
IRON SATURATION: 27 %
IRON: 106 ug/dL (ref 37–170)
TOTAL IRON BINDING CAPACITY: 397 ug/dL (ref 252–479)

## 2023-09-09 LAB — HEPATIC FUNCTION PANEL
ALBUMIN: 4.5 g/dL (ref 3.4–5.0)
ALKALINE PHOSPHATASE: 75 U/L (ref 38–126)
ALT (SGPT): 27 U/L (ref <50)
AST (SGOT): 28 U/L (ref 19–55)
BILIRUBIN DIRECT: 0.2 mg/dL (ref 0.00–0.40)
BILIRUBIN TOTAL: 0.5 mg/dL (ref 0.1–1.2)
PROTEIN TOTAL: 7.1 g/dL (ref 6.5–8.3)

## 2023-09-09 LAB — BASIC METABOLIC PANEL
ANION GAP: 10 mmol/L (ref 5–14)
BLOOD UREA NITROGEN: 13 mg/dL (ref 7–21)
BUN / CREAT RATIO: 11
CALCIUM: 9.4 mg/dL (ref 8.5–10.2)
CHLORIDE: 98 mmol/L (ref 98–107)
CO2: 31 mmol/L (ref 22.0–32.0)
CREATININE: 1.2 mg/dL (ref 0.70–1.30)
EGFR CKD-EPI (2021) MALE: 62 mL/min/1.73m2 (ref >=60)
GLUCOSE RANDOM: 277 mg/dL — ABNORMAL HIGH (ref 74–106)
POTASSIUM: 4.6 mmol/L (ref 3.5–5.0)
SODIUM: 139 mmol/L (ref 135–145)

## 2023-09-09 LAB — LIPID PANEL
CHOLESTEROL: 137 mg/dL (ref <200)
HDL CHOLESTEROL: 46 mg/dL (ref >40)
LDL CHOLESTEROL CALCULATED: 59 mg/dL (ref <100)
NON-HDL CHOLESTEROL: 91 mg/dL (ref <130)
TRIGLYCERIDES: 224 mg/dL — ABNORMAL HIGH (ref <150)

## 2023-09-09 LAB — MAGNESIUM: MAGNESIUM: 1.8 mg/dL (ref 1.6–2.2)

## 2023-09-09 LAB — THYROID FUNCTION CASCADE: THYROID STIMULATING HORMONE: 1.86 u[IU]/mL (ref 0.600–3.300)

## 2023-09-09 LAB — VITAMIN D 25 HYDROXY: VITAMIN D, TOTAL (25OH): 28.6 ng/mL — ABNORMAL LOW (ref 30.0–100.0)

## 2023-09-09 LAB — FOLATE: FOLATE: 9.6 ng/mL (ref 5.3–14.4)

## 2023-09-09 LAB — FERRITIN: FERRITIN: 26.1 ng/mL (ref 16.0–294.0)

## 2023-09-09 LAB — ALBUMIN / CREATININE URINE RATIO
ALBUMIN QUANT URINE: 1.3 mg/dL
ALBUMIN/CREATININE RATIO: 4.1 ug/mg (ref 0.0–30.0)
CREATININE, URINE: 314.1 mg/dL

## 2023-09-09 NOTE — Unmapped (Signed)
 Health Maintenance Due   Topic Date Due    Zoster Vaccines (1 of 2) Never done    DTaP/Tdap/Td Vaccines (1 - Tdap) 04/08/2002    COVID-19 Vaccine (1 - 2024-25 season) Never done    Urine Albumin/Creatinine Ratio  07/30/2023           Goals & Recommendations:    Goals and Recommendations related to your health are outlined below.    If discussed and provided at your visit today: Please, keep and record your blood sugar, blood pressure, weight, fluid intake, dietary intake and exercise amount and frequency.     Blood Pressure Goals (unless otherwise instructed):  Check 2-3 times a week.  Typical average out of office measurement: <135 mmHg systolic and < 85 mmHg diastolic (less than 135/85).  More strict recommendations for out of office measurement: < 130 mmHg systolic and < 80 mmHg diastolic (less than 130/80).  Typical in office measurement <140 mmHg systolic and <90 mmHg diastolic (less than 140/90).  Most people will not feel well if blood pressure drops to less than 110/70, unless they typically have blood pressures in this range.   If you are noticing sudden increases or decreases in blood pressure, then please contact the office.    Diabetes: Goal Hemoglobin A1c 7.9 or lower.  Goal Blood Sugars Fasting: 60-140 (may check fasting and two hours after a meal later in the day).    For Monitoring your Mood your PHQ-9 Goal is less than 10.    Drink 48 ounces of water per day.  Avoid sweetened beverages.  You will generally feel better and experience improved health when you limit, and if possible avoid, processed foods.  Diets high in plant based foods, vegetables and fruits are associated with many health benefits.    Exercise for a minimum of 20 minutes daily. Minimum of four days a week.   If possible try to obtain 60 minutes of exercise daily for five or more days per week.  Regular movement throughout the day is best.   Walking is a good form of movement.   Squats, lunges, step-ups, push-ups, planks, and arm exercises with or without weights or resistance bands are good forms of exercise.    If your BMI (body mass index) is above 25 then your body is carrying more weight than is typically healthy.  If your BMI (body mass index) is above 25 and you are not achieving the healthy weight goals that you desire, then consider the following:  Keep calories below 2500 calories per day unless otherwise instructed. Remember that all calories are not equal.  Keep a food journal and record accurately.  Read labels and ingredient lists on the products that you consume.  Sometimes we need to adjust your level of physical activity, and or tailor an exercise program more specifically for your needs, just ask and we will come up with a plan.  If you have questions about any of these recommendations, please ask.    If you smoke, stop.  If you have heart failure or severe kidney disease: weigh yourself daily and maintain fluid intake to less than 2 Liters daily unless otherwise instructed.  If you have diabetes you need to see the eye doctor at least every one to two years and have a yearly foot exam.  Set aside time daily for relaxation.  Plan a pleasant activity that you can look forward to and enjoy.  Medication changes are outlined on your After Visit Summary.  Any referrals placed today will be outlined on your After Visit Summary.    If you have questions, please call the office or use MyChart.   If you need a refill on a medication, please contact your pharmacy.  Please, allow at least two business days for responses to refill requests.    Please, bring all medications and when possible original prescription containers to each and every office visit.    If you have been provided and / or requested to record your blood sugars, blood pressures, weight, fluid intake, dietary intake, or exercise, then please bring records to each and every appointment.    When you see other providers for health care, please request that they send information and records related to your visit to our office. This includes vaccinations, mammograms, radiology, procedures, diagnostic testing, lab testing and physical exams or wellness exams that you have outside of the [facility].      [facility]  [address]  [place], [place]  [address]  Office: [phone]  Fax: [phone]             Learning About Mindfulness for Stress  What are mindfulness and stress?    Stress is what you feel when you have to handle more than you are used to. A lot of things can cause stress. You may feel stress when you go on a job interview, take a test, or run a race. This kind of short-term stress is normal and even useful. It can help you if you need to work hard or react quickly.  Stress also can last a long time. Long-term stress is caused by stressful situations or events. Examples of long-term stress include long-term health problems, ongoing problems at work, and conflicts in your family. Long-term stress can harm your health.  Mindfulness is a focus only on things happening in the present moment. It's a process of purposefully paying attention to and being aware of your surroundings, your emotions, your thoughts, and how your body feels. You are aware of these things, but you aren't judging these experiences as good or bad. Mindfulness can help you learn to calm your mind and body to help you cope with illness, pain, and stress.  How does mindfulness help to relieve stress?  Mindfulness can help quiet your mind and relax your body. Studies show that it can help some people sleep better, feel less anxious, and bring their blood pressure down. And it's been shown to help some people live and cope better with certain health problems like heart disease, depression, chronic pain, and cancer.  How do you practice mindfulness?  To be mindful is to pay attention, to be present, and to be accepting.  When you're mindful, you do just one thing and you pay close attention to that one thing. For example, you may sit quietly and notice your emotions or how your food tastes and smells.  When you're present, you focus on the things that are happening right now. You let go of your thoughts about the past and the future. When you dwell on the past or the future, you miss moments that can heal and strengthen you. You may miss moments like hearing a child laugh or seeing a friendly face when you think you're all alone.  When you're accepting, you don't judge the present moment. Instead you accept your thoughts and feelings as they come.  You can practice anytime, anywhere,  and in any way you choose. You can practice in many ways. Here are a few ideas:  While doing your chores, like washing the dishes, let your mind focus on what's in your hand. What does the dish feel like? Is the water warm or cold?  Go outside and take a few deep breaths. What is the air like? Is it warm or cold?  When you can, take some time at the start of your day to sit alone and think.  Take a slow walk by yourself. Count your steps while you breathe in and out.  Try yoga breathing exercises, stretches, and poses to strengthen and relax your muscles.  At work, if you can, try to stop for a few moments each hour. Note how your body feels. Let yourself regroup and let your mind settle before you return to what you were doing.  If you struggle with anxiety or worry thoughts, imagine your mind as a blue sky and your worry thoughts as clouds. Now imagine those worry thoughts floating across your mind's sky. Just let them pass by as you watch.  Follow-up care is a key part of your treatment and safety. Be sure to make and go to all appointments, and call your doctor if you are having problems. It's also a good idea to know your test results and keep a list of the medicines you take.  Where can you learn more?  Go to [facility] at [email or web address]  Select Preferences in the upper right hand corner, then select Health Library under Resources. Enter [phone] in the search box to learn more about Learning About Mindfulness for Stress.  Current as of: January 15, 2016  Content Version: 11.6  ?? 2006-2018 [employer]. Care instructions adapted under license by [facility]. If you have questions about a medical condition or this instruction, always ask your healthcare professional. [employer], Incorporated disclaims any warranty or liability for your use of this information.         Learning About Guided Imagery for Stress  What are guided imagery and stress?    Stress is what you feel when you have to handle more than you are used to. A lot of things can cause stress. You may feel stress when you go on a job interview, take a test, or run a race. This kind of short-term stress is normal and even useful. It can help you if you need to work hard or react quickly.  Stress also can last a long time. Long-term stress is caused by stressful situations or events. Examples of long-term stress include long-term health problems, ongoing problems at work, and conflicts in your family. Long-term stress can harm your health.  Guided imagery is a technique of directed thoughts and suggestions that guide your mind toward a relaxed, focused state. This technique helps you use your mind to take you to a calm, peaceful place. You can use it to relax, which can lower blood pressure and reduce other problems related to stress.  How does guided imagery help to relieve stress?  Because of the way the mind and body are connected, guided imagery can make you feel like you are experiencing something just by imagining it. You can achieve a relaxed state when you imagine all the details of a safe, comfortable place, such as a beach or a garden. This relaxed state may help you feel more in control of your emotions and thought processes. Feeling in control may improve your attitude, health, and  sense of well-being.  How do you do guided imagery?  You can use a smartphone app or a video to lead you through the process. You use all of your senses in guided imagery. For example, if you want a tropical setting, you can imagine the warm breeze on your skin, the bright blue of the water, the sound of the surf, the sweet scent of tropical flowers, and the taste of coconut. Imagining those things can make you actually feel like you're there.  But you don't have to imagine the tropics to feel peace. Guided imagery can take you to your own restful place. To give guided imagery a try, follow these steps:  Lean back comfortably in your chair. If you can, close your eyes. Put your arms on the armrests, or fold your hands in your lap.  Take a deep breath through your nose. Breathe in slowly, and then let the air out completely through your mouth.  Do it again slowly. Keep breathing like this. Gather up any tension in your body, and send it out with every breath.  Let a feeling of warmth spread from your lungs to your neck and head, down your arms to your fingertips, through your body and into your legs, all the way down to your toes. Stay this way for a moment.  Now imagine a pleasant day. You're at a park or at a place you've visited in the past where you felt at peace.  In your mind's eye, look at what lies in front of you. Maybe you see the sun, filtered through trees. Maybe clouds are drifting by.  Look to one side, and then the other. Notice the feel of the air around you. Notice how it feels on your face and on your arms.  Stay here for a while. Let it become real for you.  Follow-up care is a key part of your treatment and safety. Be sure to make and go to all appointments, and call your doctor if you are having problems. It's also a good idea to know your test results and keep a list of the medicines you take.  Where can you learn more?  Go to [facility] at [email or web address]  Select Preferences in the upper right hand corner, then select Health Library under Resources. Enter N291 in the search box to learn more about Learning About Guided Imagery for Stress.  Current as of: January 15, 2016  Content Version: 11.6  ?? 2006-2018 [employer], [employer]. Care instructions adapted under license by [facility]. If you have questions about a medical condition or this instruction, always ask your healthcare professional. Healthwise, Incorporated disclaims any warranty or liability for your use of this information.        Learning About the Mediterranean Diet  What is the Mediterranean diet?    The Mediterranean diet is a style of eating rather than a diet plan. It features foods eaten in [place], [place], southern [place] and [place], and other countries along the [employer]. It emphasizes eating foods like fish, fruits, vegetables, beans, high-fiber breads and whole grains, nuts, and olive oil. This style of eating includes limited red meat, cheese, and sweets.  Why choose the Mediterranean diet?  A Mediterranean-style diet may improve heart health. It contains more fat than other heart-healthy diets. But the fats are mainly from nuts, unsaturated oils (such as fish oils and olive oil), and certain nut or seed oils (such as canola, soybean, or flaxseed oil). These fats may help protect  the heart and blood vessels.  How can you get started on the Mediterranean diet?  Here are some things you can do to switch to a more Mediterranean way of eating.  What to eat  Eat a variety of fruits and vegetables each day, such as grapes, blueberries, tomatoes, broccoli, peppers, figs, olives, spinach, eggplant, beans, lentils, and chickpeas.  Eat a variety of whole-grain foods each day, such as oats, brown rice, and whole wheat bread, pasta, and couscous.  Eat fish at least 2 times a week. Try tuna, salmon, mackerel, lake trout, herring, or sardines.  Eat moderate amounts of low-fat dairy products, such as milk, cheese, or yogurt.  Eat moderate amounts of poultry and eggs.  Choose healthy (unsaturated) fats, such as nuts, olive oil, and certain nut or seed oils like canola, soybean, and flaxseed.  Limit unhealthy (saturated) fats, such as butter, palm oil, and coconut oil. And limit fats found in animal products, such as meat and dairy products made with whole milk. Try to eat red meat only a few times a month in very small amounts.  Limit sweets and desserts to only a few times a week. This includes sugar-sweetened drinks like soda.  The Mediterranean diet may also include red wine with your meal--1 glass each day for women and up to 2 glasses a day for men.  Tips for eating at home  Use herbs, spices, garlic, lemon zest, and citrus juice instead of salt to add flavor to foods.  Add avocado slices to your sandwich instead of bacon.  Have fish for lunch or dinner instead of red meat. Brush the fish with olive oil, and broil or grill it.  Sprinkle your salad with seeds or nuts instead of cheese.  Cook with olive or canola oil instead of butter or oils that are high in saturated fat.  Switch from 2% milk or whole milk to 1% or fat-free milk.  Dip raw vegetables in a vinaigrette dressing or hummus instead of dips made from mayonnaise or sour cream.  Have a piece of fruit for dessert instead of a piece of cake. Try baked apples, or have some dried fruit.  Tips for eating out  Try broiled, grilled, baked, or poached fish instead of having it fried or breaded.  Ask your server to have your meals prepared with olive oil instead of butter.  Order dishes made with marinara sauce or sauces made from olive oil. Avoid sauces made from cream or mayonnaise.  Choose whole-grain breads, whole wheat pasta and pizza crust, brown rice, beans, and lentils.  Cut back on butter or margarine on bread. Instead, you can dip your bread in a small amount of olive oil.  Ask for a side salad or grilled vegetables instead of french fries or chips.  Where can you learn more?  Go to [facility] at [email or web address]  Select Health Library under the Resources menu. Enter O407 in the search box to learn more about Learning About the Mediterranean Diet.  Current as of: February 11, 2017  Content Version: 12.2  ?? 2006-2019 Healthwise, Inc[employer]. Care instructions adapted under license by [facility]. If you have questions about a medical condition or this instruction, always ask your healthcare professional. Health[employer], Inc[employer] disclaims any warranty or liability for your use of this information.            Stretching: Exercises  Your Care Instructions  Here are some examples of exercises for stretching. Start each exercise slowly. Ease off the  exercise if you start to have pain.  Your doctor or physical therapist will tell you when you can start these exercises and which ones will work best for you.  How to do the exercises  Latissimus stretch    Stand with your back straight and your feet shoulder-width apart. You can do this stretch sitting down if you are not steady on your feet.  Hold your arms above your head, and hold one hand with the other.  Pull upward while leaning straight over toward your right side. Keep your lower body straight. You should feel the stretch along your left side.  Hold 15 to 30 seconds, and then switch sides.  Repeat 2 to 4 times for each side.  Triceps stretch    Stand with your back straight and your feet shoulder-width apart. You can do this stretch sitting down if you are not steady on your feet.  Bring your left elbow straight up while bending your arm.  Grab your left elbow with your right hand, and pull your left elbow toward your head with light pressure. If you are more flexible, you may pull your arm slightly behind your head. You will feel the stretch along the back of your arm.  Hold 15 to 30 seconds, and then switch elbows.  Repeat 2 to 4 times for each arm.  Calf stretch    Place your hands on a wall for balance. You can also do this with your hands on the back of a chair, a countertop, or a tree.  Step back with your left leg. Keep the leg straight, and press your left heel into the floor.  Press your hips forward, bending your right leg slightly. You will feel the stretch in your left calf.  Hold the stretch 15 to 30 seconds.  Repeat 2 to 4 times for each leg.  Quadriceps stretch    Lie on your side with one hand supporting your head.  Bend your upper leg back and grab your ankle with your other hand.  Stretch your leg back by pulling your foot toward your buttocks. You will feel the stretch in the front of your thigh. If this causes stress on your knees, do not do this stretch.  Hold the stretch 15 to 30 seconds.  Repeat 2 to 4 times for each leg.  Groin stretch    Sit on the floor and put the soles of your feet together. Do not slump your back.  Grab your ankles and gently pull your legs toward you.  Press your knees toward the floor. You will feel the stretch in your inner thighs.  Hold 15 to 30 seconds.  Repeat 2 to 4 times.  Hamstring stretch in doorway    Lie on the floor near a doorway, with your buttocks close to the wall.  Let the leg you are not stretching extend through the doorway.  Put the leg you want to stretch up on the wall, and straighten your knee to feel a gentle stretch at the back of your leg.  Hold the stretch for at least 15 to 30 seconds. Repeat 2 to 4 times.  Follow-up care is a key part of your treatment and safety. Be sure to make and go to all appointments, and call your doctor if you are having problems. It's also a good idea to know your test results and keep a list of the medicines you take.  Where can you learn more?  Go to [facility] at  [email or web address]  Select Preferences in the upper right hand corner, then select Health Library under Resources. Enter P733 in the search box to learn more about Stretching: Exercises.  Current as of: March 13, 2016  Content Version: 11.6  ?? 2006-2018 Healthwise, [employer]. Care instructions adapted under license by [facility]. If you have questions about a medical condition or this instruction, always ask your healthcare professional. Health[employer], Inc[employer] disclaims any warranty or liability for your use of this information.   If a lab, test, imaging order or referral was placed today it will show on your AVS.    Labs:  Please obtain labs today prior to leaving, unless otherwise instructed.  If you have been asked to obtain labs at a later date, please schedule a Lab Visit appointment.    X-rays (plain films):    Plain film (traditional) x-rays do not require scheduling at [facility] or elsewhere in the [facility] system.  If you had an x-ray ordered today, our office recommends going to get the x-ray performed as soon as you can, unless otherwise instructed.  If you are obtaining X-rays (plain films) outside of the [facility] system, then please verify with your health care provider so that we can assist in coordinating any needed scheduling / appointments.    Referrals (referral for specialty consultation), Imaging (CT, MRI, Ultrasounds, Cardiac Testing, Other Imaging, Spirometry, Sleep Studies):  If there is a number on your AVS today for a location to call, then calling this number is recommended.  If there is not a number to call, then you will be contacted via your listed preferred phone number, text and or MyChart.  If you are signed up for [facility], then some information may be directed to your MyChart account as the first line of contact. For example, [place] may contact you to schedule an appointment for a referral or test first via your MyChart account. If you have signed up previously for a [facility] MyChart account, though you do not currently access or know how to access, then please let a member of our office staff know. If you would like to set up a [facility] MyChart account please see the instructions near the end of your After Visit Summary. If you need assistance with [facility], you may call the [facility] at [phone].   Phone calls to schedule through [facility] may come from [phone] or [phone] area codes.  If you have not heard regarding scheduling of your referral in the next 7 days, then please contact our office at [phone] option 5.    Insurance Specific Considerations:  Please review information from your insurance provider regarding scheduling and coverage for specialty care, procedures and imaging.  If you know that your insurance specifically requires a referral or authorization prior to scheduling with a specialty care provider or for a test, please contact our office prior to scheduling at [phone] option 5.    Forms:  We are happy to assist with any forms you may need. We typically complete most forms within 7 business days.  For all forms that you need our office to complete, please review the forms in their entirety first, complete any areas that are required by you including any necessary signatures prior to leaving the forms with our office. Please note that many forms require signatures from patients related to consent for a health care provider or office to complete the form, where required this if often outlined on the  forms that you would be submitting. Please also note the time frames for requested return of forms prior to providing to our office as well as any supporting documentation that you may need to provide. The most common reason for delayed completion of forms or return of forms to patients or submissions elsewhere are forms which are not signed by the patient where needed or forms requested for which we do not have supporting documentation or where additional information is needed prior to our completion of the requested forms. Please also note that there are instances beyond our control where forms may not be available within 7 business days.    Refills:  Please allow 3 business days for any refill requests to be processed. Please contact your pharmacy for any refills and to check the status on any refill request. In some instances your insurance may require a prior approval for certain medications. In those cases, refills are processed after we receive approval and this could possibly lengthen the turnaround time.    No Show and Late arrivals:  Please call the office when you need to cancel or reschedule an appointment, not doing so places the appointment as a no-show. If you have 3 or more no shows in a year, you may be asked to locate another provider outside of our office. If you arrive 15 minutes after your appointment time, you may be asked to reschedule.    Phone:  Our phone calls are answered Monday-Friday 8:00 am to 5:00 pm, by a staff member. Calls outside of these times and on weekends, will be answered by a registered nurse with [facility]. If needed, they will reach out to providers on call. Refills are not processed after normal business hours.     Coordination of Care:  When you see other providers for health care, please request that they send information and records related to your visit to our office. This includes vaccinations, mammograms, radiology, procedures, diagnostic testing, lab testing and physical exams or wellness exams that you have outside of the [facility] system. Our contact information is outlined below. Please periodically review your preferred contact information, preferred methods of contact and individuals listed on your contact list in our records. We recommend when possible having at least two phone numbers available for contact. If you have a MyChart account, please access and review your account periodically to update information and check for any new information that may have been sent to you.     Planning for your next visit:  Please bring any needed information to update your insurance, payment records, and preferred contact information.  Updated Medication List including supplements and over the counter medications.  Any information related to health care visits elsewhere since your last visit, including: urgent cares, emergency departments, specialist offices, or other imaging or procedures.    We Acknowledge:  We acknowledge that while technology has made great progress in allowing us  to communicate more and has improved some of the connectivity of health care and health care providers, these systems are not perfect. If any questions exist please always ask and if anything is unclear please let us  know. In regards to tests, labs, imaging and recommendations for referrals or other procedures from our office, you should always hear something as far as an appointment, a result, a recommendation or a next step.    Billing:  For questions regarding billing please call [phone] option #4.  If you are signed up for [facility], then all billing  statements will come to you directly by way of your [facility] MyChart account. Beginning in September 2023, if you are signed up for [facility], you will not receive a mailed paper billing statement from [facility]. If you have signed up previously for a [facility] MyChart account, though you do not currently access or know how to access, then please let a member of our office staff know. If you would like to set up a [facility] MyChart account please see the instructions near the end of your After Visit Summary. If you need assistance with your [facility], you may call the [facility] Out[facility] Access Center at [phone].     Inclement Weather:  During times of inclement weather please call the office to check on any changes in hours of operation.    Survey:  Thank you for the chance to participate in your health care.  Soon you should be receiving a survey about your recent visit to [facility].  Please take a moment to complete this survey.  We take your feedback very seriously and will use it to guide our continuous improvement efforts.     [facility] at [facility]  [address], [address]  [place], [place]  [address]  Office: [phone]  Fax: [phone]           Patient Education        Medicines to Avoid With Kidney Disease: Care Instructions  Overview     Kidney disease means that your kidneys are not able to get rid of waste from the blood. So they can't keep your body's fluids and chemicals in balance. Usually, the kidneys get rid of waste from the blood through the urine. And they balance the fluids in the body.  When your kidneys don't work as they should, you have to be careful about some medicines. They may harm your kidneys. Your doctor may tell you not to take them or may change the dose.  Medicines for pain and swelling, such as ibuprofen (Advil or Motrin) or naproxen (Aleve), can cause harm. So can some antibiotics and antacids. And you need to be careful about some drugs that treat cancer, lower blood pressure, or get rid of water from the body. Some herbal products could cause harm too.  Follow-up care is a key part of your treatment and safety. Be sure to make and go to all appointments, and call your doctor if you are having problems. It's also a good idea to know your test results and keep a list of the medicines you take.  How can you care for yourself at home?  Tell your doctor all the prescription, herbal, or over-the-counter medicines you take. Do not take any new ones unless you talk to your doctor first.  Do not take anti-inflammatory medicines. These include ibuprofen (Advil, Motrin) and naproxen (Aleve). You can use acetaminophen (Tylenol) for pain.  Do not take two or more pain medicines at the same time unless the doctor told you to. Many pain medicines have acetaminophen, which is Tylenol. Too much acetaminophen (Tylenol) can be harmful.  Tell all doctors and others who work with your health care that you have kidney disease.  Wear medical alert jewelry that lists your health problem. You can buy this at most drugstores.  Where can you learn more?  Go to [facility] at https://myuncchart.Armed forces logistics/support/administrative officer in the Menu. Enter [id number] in the search box to learn more  about Medicines to Avoid With Kidney Disease: Care Instructions.  Current as of: January 15, 2022  Content Version: 14.3  ?? 2024 [place], [place].   Care instructions adapted under license by [facility]. If you have questions about a medical condition or this instruction, always ask your healthcare professional. Laren Player, [facility], disclaims any warranty or liability for your use of this information.

## 2023-09-11 NOTE — Unmapped (Signed)
 Vitamin D is slightly low.  Blood sugar is elevated.  Remainder of labs are otherwise overall okay.  Continue most recently medications recommendations.  Keep planned follow-up appointment.

## 2023-09-12 MED ORDER — ELIQUIS 5 MG TABLET
ORAL_TABLET | Freq: Two times a day (BID) | ORAL | 6 refills | 0.00000 days
Start: 2023-09-12 — End: ?

## 2023-09-14 NOTE — Unmapped (Signed)
 Patient is requesting the following refill  Requested Prescriptions     Pending Prescriptions Disp Refills    ELIQUIS 5 mg Tab [Pharmacy Med Name: ELIQUIS 5 MG TABLET] 60 tablet 6     Sig: TAKE 1 TABLET BY MOUTH TWO TIMES A DAY.       Recent Visits  Date Type Provider Dept   09/09/23 Office Visit Barabara Levering, MD [place] [facility] At [facility]   06/09/23 Office Visit Barabara Levering, MD [place] [facility] At [facility]   04/06/23 Office Visit Barabara Levering, MD [place] [facility] At [facility]   03/11/23 Office Visit Barabara Levering, MD [place] [facility] At [facility]   12/10/22 Office Visit Barabara Levering, MD [place] [facility] At [facility]   11/10/22 Office Visit Barabara Levering, MD [place] [facility] At [facility]   10/07/22 Office Visit Semerzier, Unknown Garbe, RN [place] [facility] At [facility]   10/07/22 Office Visit Barabara Levering, MD [place] [facility] At [facility]   Showing recent visits within past 365 days and meeting all other requirements  Future Appointments  Date Type Provider Dept   12/10/23 Appointment Barabara Levering, MD  Primary Care At [facility]   Showing future appointments within next 365 days and meeting all other requirements       Labs: Not applicable this refill

## 2023-09-15 MED ORDER — ELIQUIS 5 MG TABLET
ORAL_TABLET | Freq: Two times a day (BID) | ORAL | 6 refills | 30.00000 days | Status: CP
Start: 2023-09-15 — End: ?

## 2023-09-15 NOTE — Unmapped (Signed)
 Assessment/Plan     Diagnoses and all orders for this visit:    Type 2 diabetes mellitus with stage 3b chronic kidney disease, without long-term current use of insulin     -     POCT glycosylated hemoglobin (Hb A1C)  Stable overall. Well controlled overall. Continue current regimen.  -     Albumin/creatinine urine ratio; Future  -     Iron & TIBC; Future  -     Ferritin; Future  -     Vitamin D 25 Hydroxy (25OH D2 + D3); Future  -     Vitamin B12 Level; Future  -     Folate Level; Future  -     Thyroid Function Cascade; Future  -     CBC w/ Differential; Future  -     Basic Metabolic Panel; Future  -     Hepatic Function Panel; Future  -     Lipid Panel; Future  -     Magnesium Level; Future    Memory changes  Clinically stable.  Standard education and anticipatory guidance regarding goals, objectives, and sequelae.  Advised aggressive efforts to maintain cognition to include sleep hygiene, appropriate diet, exercise and engaging in mentally challenging activities.  Otherwise continue previously outlined recommendations and plan of care.  -     Iron & TIBC; Future  -     Ferritin; Future  -     Vitamin D 25 Hydroxy (25OH D2 + D3); Future  -     Vitamin B12 Level; Future  -     Folate Level; Future  -     Thyroid Function Cascade; Future  -     CBC w/ Differential; Future  -     Basic Metabolic Panel; Future  -     Hepatic Function Panel; Future  -     Lipid Panel; Future  -     Magnesium Level; Future    Interstitial pulmonary disease, unspecified    Currently clinically stable.  Continue current plan of care.    Coronary artery disease involving native coronary artery of native heart, unspecified whether angina present  Clinically asymptomatic.   Standard education and anticipatory guidance regarding goals, objectives, and sequelae.  Continue current management and plan of care.  Continue aggressive risk factor reduction.  -     Iron & TIBC; Future  -     Ferritin; Future  -     Vitamin D 25 Hydroxy (25OH D2 + D3); Future  -     Vitamin B12 Level; Future  -     Folate Level; Future  -     Thyroid Function Cascade; Future  -     CBC w/ Differential; Future  -     Basic Metabolic Panel; Future  -     Hepatic Function Panel; Future  -     Lipid Panel; Future  -     Magnesium Level; Future    Stage 3a chronic kidney disease (CMS-HCC)  Clinically asymptomatic.   Standard education and anticipatory guidance regarding goals, objectives, and sequelae.  Continue current management and plan of care.  Continue aggressive risk factor reduction.  -     Iron & TIBC; Future  -     Ferritin; Future  -     Vitamin D 25 Hydroxy (25OH D2 + D3); Future  -     Vitamin B12 Level; Future  -     Folate Level; Future  -  Thyroid Function Cascade; Future  -     CBC w/ Differential; Future  -     Basic Metabolic Panel; Future  -     Hepatic Function Panel; Future  -     Lipid Panel; Future  -     Magnesium Level; Future    History of ischemic stroke  Clinically asymptomatic.   Standard education and anticipatory guidance regarding goals, objectives, and sequelae.  Continue current management and plan of care.  Continue aggressive risk factor reduction.    Disposition: Return in about 3 months (around 12/10/2023) for Recheck, DM.  09/12/2023      Diagnoses and plan along with any newly prescribed medication(s), recommendations, orders and therapies were discussed in detail with patient today.   Standard education and anticipatory guidance regarding goals, objectives and potential sequelae were reviewed. Patient voiced appropriate understanding of assessment and plan of care.    Pertinent handouts were given today and reviewed with the patient as indicated.  The Care Plan and Self-Management goals have been included on the AVS and the AVS has been printed.  Where helpful for monitoring and management I have encouraged the patient to keep regular logs for me to review at their next visit. Any outside resources or referrals needed at this time are noted above. No additional referrals necessary at this time. Patient voiced understanding and all questions have been answered to satisfaction.     To complete this documentation in a timely manner, portions of this encounter may have been completed utilizing Dragon voice recognition software. Please take this into consideration in noting nuances of documentation, grammar, punctuation, and wording. If and when a question exists, or for clarification, please contact the author.    Carolynne Citron, MD  [facility] Care at [facility]  [address], [address]  [place], [name] [address]  Phone [phone]    Subjective:     ATF:TDDUKG, Jesus Render, MD    Jesus Rubio, is a 78 y.o. male    Chief Complaint   Patient presents with    Diabetes     Pt reports he monitors blood sugars at home. Ranges 130s-150s.    Memory Check       HPI:    Patient presents: Alone.    Nursing notes and history reviewed with patient.    Diabetes mellitus type 2.  Goal A1c less than 8.  No hypo or hyperglycemic symptoms.  Compliant medication regimen denies side effects.    Memory changes.  Noted previously.  No acute changes.  Memory has been fairly stable.  No new strokelike symptoms.    Interstitial pulmonary disease.  No chest pain concerning cough or shortness of breath.    Coronary artery disease. Compliant with therapy. No chest pain, shortness of breath, orthopnea, PND.  No increase nitroglycerin use.  No bleeding complications from aspirin therapy noted.    Stage IIIa chronic kidney disease.  No urinary tract symptoms, shortness of breath, abdominal swelling, lower extremity edema, orthopnea, pnd.  Compliant with renal protection recommendations.    History of ischemic stroke.  Doing well at present.  No new strokelike symptoms.    No other complaints.  No additional recent health care visits elsewhere. No urgent care or ED visits.  No additional palliative, provocative, modifying, quantifying, qualifying, or other related factors.  Unless otherwise outlined above patient is pleased with current medication regimen, admits compliance and denies side effects of medication.    The following portions of  the patient's history were reviewed and updated as appropriate: allergies, current medications, past family history, past medical history, past social history, past surgical history and problem list.    ROS: Pertinent positives and negatives are as outlined in the HPI above. The remaining balance of the 12 point review of systems is otherwise benign.      PCMH Components:      Goals         <130/80       Lack of exercise or physical activity    Increase exercise or physical activity        Get heart rate stabilized (pt-stated)         Things to think about to help me reach my goal:     What are you going to do? Take medications as prescribed, Keep positive thinking, Pace activities, Stay hydrated   How and how much? Daily   How frequent? Daily   Barriers to success?    Solutions to barriers?      06/04/17: Patient continues to work on this goal   09/29/17 Patient continues to work on this goal   12-09-2017: Reports he continues to work on this and is staying positive.        Take actions to prevent falling       12-03-2016: New goal: Will change positions slowly and pay attention to pathways. Uses cane or walker at times.   09/29/17 Patient continues to work on this goal   12-09-2017: Reports 1 fall 6-8 months ago when he missed a step and fell. Denies injury. Uses cane occasionally.        Weight < 90.7 kg (200 lb)       October 07, 2022 11:24 AM AWV Goal   Things to think about to help me reach my goal:     What are you going to do? Lose weight   How and how much? 20 lbs   How frequent? daily   Barriers to success? None identified   Solutions to barriers? N/a                     Family characteristics, patient's social characteristics, patient's cultural characteristics, patient's communication needs, patient's health literacy and behaviors affecting patient's health are outlined under the patient's History Section, Longitudinal Care Plan or Problem List in Epic or as pertinent in History of Present Illness.    Barriers to goals identified and addressed.   See Assessment and Plan for additional pertinent details.    Objective:     Physical Exam   BP 136/74 (BP Position: Sitting)  - Pulse 67  - Temp 36.8 ??C (98.2 ??F) (Oral)  - Resp 18  - Ht 182.9 cm (6' 0.01)  - Wt 97.5 kg (215 lb)  - SpO2 93%  - BMI 29.15 kg/m??   Constitutional: Oriented to person, place, and time. Appears well-developed and well-nourished. No distress.   Head: Normocephalic and atraumatic.   Mouth/Throat: Oropharynx is without mass or concerning lesions. Moist mucous membranes.    Eyes: Conjunctivae are normal. No scleral icterus.   Neck: Neck supple. Normal range of motion. No JVD present. No thyromegaly present. No concerning neck mass.   Cardiovascular: Normal rate. Regular rhythm. Normal heart sounds. No murmur heard.  Pulmonary/Chest: Effort normal. No respiratory distress. Breath sounds normal. No wheezes. No rhonchi. No rales. No focal breath sound changes. No chest tenderness.  Abdominal: Soft. Non-distended. No abdominal mass. No tenderness. No rebound. No  guarding.   Musculoskeletal: No active synovitis. No concerning joint erythema, warmth or swelling.   Range of motion is without evidence of significant impairment.   Strength is without evidence of significant impairment.   No edema of the extremities.   Lymphadenopathy: There is no lymphadenopathy about the head or neck.  Neurological: Alert and oriented to person, place, and time. No cranial nerve deficit. Normal muscle tone. Normal coordination.  Skin: Skin is warm and dry. No pallor. No concerning rash noted.   Psychiatric: Normal mood, affect and behavior. Judgment and thought content normal.

## 2023-10-08 DIAGNOSIS — E1159 Type 2 diabetes mellitus with other circulatory complications: Principal | ICD-10-CM

## 2023-10-08 DIAGNOSIS — I259 Chronic ischemic heart disease, unspecified: Principal | ICD-10-CM

## 2023-10-08 DIAGNOSIS — I152 Hypertension secondary to endocrine disorders: Principal | ICD-10-CM

## 2023-10-08 DIAGNOSIS — L304 Erythema intertrigo: Principal | ICD-10-CM

## 2023-10-08 DIAGNOSIS — I240 Acute coronary thrombosis not resulting in myocardial infarction: Principal | ICD-10-CM

## 2023-10-08 DIAGNOSIS — L299 Pruritus, unspecified: Principal | ICD-10-CM

## 2023-10-08 MED ORDER — NYSTATIN 100,000 UNIT/GRAM TOPICAL CREAM
Freq: Three times a day (TID) | TOPICAL | 0 refills | 0.00000 days | Status: CP
Start: 2023-10-08 — End: 2024-10-07

## 2023-10-08 MED ORDER — METOPROLOL SUCCINATE ER 100 MG TABLET,EXTENDED RELEASE 24 HR
ORAL_TABLET | Freq: Every day | ORAL | 1 refills | 90.00000 days | Status: CP
Start: 2023-10-08 — End: ?

## 2023-10-08 NOTE — Unmapped (Signed)
 Patient is requesting the following refill  Requested Prescriptions     Pending Prescriptions Disp Refills    metoPROLOL  succinate (TOPROL -XL) 100 MG 24 hr tablet [Pharmacy Med Name: METOPROLOL  SUCC ER 100 MG TAB] 180 tablet 1     Sig: TAKE 2 TABLETS BY MOUTH EVERY DAY    nystatin  (MYCOSTATIN ) 100,000 unit/gram cream [Pharmacy Med Name: NYSTATIN  100,000 UNIT/GM CREAM] 30 g 0     Sig: APPLY TOPICALLY THREE (3) TIMES A DAY.       Recent Visits  Date Type Provider Dept   09/09/23 Office Visit Baldwin Alm Lever, MD [place] [facility]   06/09/23 Office Visit Baldwin Alm Lever, MD [place] [facility]   06/01/23 Office Visit Margaree Spruce, MD [facility]   04/06/23 Office Visit Baldwin Alm Lever, MD [place] [facility] At [facility]   03/11/23 Office Visit Baldwin Alm Lever, MD [place] [facility] At [facility]   01/29/23 Office Visit Margaree Spruce, MD [place] [facility]   12/10/22 Office Visit Baldwin Alm Lever, MD [place] [facility] At [facility]   11/10/22 Office Visit Baldwin Alm Lever, MD [place] [facility] At [facility]   10/29/22 Office Visit Margaree Spruce, MD [place] [facility]   Showing recent visits within past 365 days and meeting all other requirements  Future Appointments  Date Type Provider Dept   10/21/23 Appointment Margaree Spruce, MD [facility]   12/10/23 Appointment Baldwin Alm Lever, MD [place] [facility] At [facility]   Showing future appointments within next 365 days and meeting all other requirements       Labs: Vitals:   BP Readings from Last 3 Encounters:   09/09/23 136/74   06/09/23 130/70   06/01/23 144/78    and   Pulse Readings from Last 3 Encounters:   09/09/23 67   06/09/23 57   06/01/23 64

## 2023-10-21 DIAGNOSIS — M791 Myalgia, unspecified site: Principal | ICD-10-CM

## 2023-10-21 DIAGNOSIS — Z789 Other specified health status: Principal | ICD-10-CM

## 2023-10-21 DIAGNOSIS — T466X5A Adverse effect of antihyperlipidemic and antiarteriosclerotic drugs, initial encounter: Principal | ICD-10-CM

## 2023-10-21 DIAGNOSIS — I240 Acute coronary thrombosis not resulting in myocardial infarction: Principal | ICD-10-CM

## 2023-10-21 DIAGNOSIS — I701 Atherosclerosis of renal artery: Principal | ICD-10-CM

## 2023-10-21 DIAGNOSIS — E785 Hyperlipidemia, unspecified: Principal | ICD-10-CM

## 2023-10-21 DIAGNOSIS — I2089 Chronic stable angina: Principal | ICD-10-CM

## 2023-10-21 DIAGNOSIS — E1169 Type 2 diabetes mellitus with other specified complication: Principal | ICD-10-CM

## 2023-10-21 DIAGNOSIS — E1159 Type 2 diabetes mellitus with other circulatory complications: Principal | ICD-10-CM

## 2023-10-21 DIAGNOSIS — I259 Chronic ischemic heart disease, unspecified: Principal | ICD-10-CM

## 2023-10-21 DIAGNOSIS — I152 Hypertension secondary to endocrine disorders: Principal | ICD-10-CM

## 2023-10-21 DIAGNOSIS — I480 Paroxysmal atrial fibrillation: Principal | ICD-10-CM

## 2023-10-21 MED ORDER — RANOLAZINE ER 500 MG TABLET,EXTENDED RELEASE,12 HR
ORAL_TABLET | Freq: Two times a day (BID) | ORAL | 11 refills | 30.00000 days | Status: CP
Start: 2023-10-21 — End: 2024-10-20

## 2023-10-21 NOTE — Unmapped (Signed)
 [facility] CARDIOLOGY    [facility]  [address]  [place], [name] [address]  Ph: [phone]  Fax: [phone] Specialty Care at [facility]  [address], [address]  [place], [name] [address]   Ph: [phone]  Fax: [phone]     Date of Service: 10/21/2023    PCP: Baldwin Alm Lever, MD  Referring Provider: Greig Macario Abu, PA    ASSESSMENT and PLAN:     Jesus Rubio is a 78 y.o. male presenting at the request of Amy Macario Abu, PA for evaluation and management of:  1. Chronic stable angina    2. Ischemic heart disease due to coronary artery obstruction       3. Hypertension associated with diabetes       4. Hyperlipidemia associated with type 2 diabetes mellitus       5. Paroxysmal atrial fibrillation       6. Renal artery stenosis (CMS-HCC)    7. Myalgia due to statin    8. Statin intolerance    9. Type 2 diabetes mellitus with other specified complication, without long-term current use of insulin         Jesus Rubio presents in follow up.  I last saw him Feb 2025. His angina is stable. He has used ntg 5-6 times since his last visit. He has chronic DOE and fatigue. We reviewed his known cardiac history. He has severe CAD that cannot be readily revascularized (CTO of the left main, CTO of the LAD). His ECG shows sinus rhythm with RBBB. We will try ranexa  to see if it helps. He has not received repatha  recently from [facility] - I have given him the number to call. Follow up 6 months.     1. 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 NSTEMI while hospitalized for COVID, managed medically with plavix . Plavix  was later discontinued in favor of eliquis  due to PE  - anti-platelets: plavix  6 months post PCI renal artery stenosis => switch to aspirin 12/2021 (w/ eliquis )  - anti-anginal: metoprolol  succinate 100mg  BID, imdur  60mg  daily  - statin: atorvastatin  40mg  daily => 10mg  ?muscle aches on higher dose => crestor  20mg  daily => Repatha , stop crestor   - last echo: 05/12/20 normal EF  - last stress/angiography:04/2021 see below. New since 2016 is CTO left main    2. Afib and Atrial flutter s/p ablation x2. Recurrence of paroxysmal Afib during hospitalization for COVID19 03/2020-04/2020. ER visit 02/2021 for symptomatic afib. No clear trigger.  - CHADS2-Vasc score: 6 (HTN, age+2, PE and CVA+2, IHD)  - Anti-coagulation: Eliquis   - Rate control: metoprolol  as above, additional 50mg  prn dose if needed for sustained palpitations  - Rhythm control: not indicated    3. COVID19 with lengthy, complicated hospitalization 03/2020-04/2020. ARDS, NSTEMI, GI bleed, ARF requiring dialysis, occipital lobe CVA, PE  - has SOB with exertion, stable    4. History of PE  - on eliquis     5. R subclavian stenosis  - BP in left arm    6. Renal artery stenosis s/p R renal artery DES 05/2021  - plavix  for 6 months    Follow-up: Return in about 6 months (around 04/22/2024).     [name], MD, PhD  [facility] Cardiology      ORDERS THIS VISIT:     Orders Placed This Encounter   Procedures    ECG 12 lead      New Prescriptions  RANOLAZINE  (RANEXA ) 500 MG 12 HR TABLET    Take 1 tablet (500 mg total) by mouth two (2) times a day.      Modified Medications    No medications on file      Discontinued Medications    No medications on file        SUBJECTIVE:     ID: [name] is a 78 y.o. male with a history of CAD s/p CABG, aflutter s/p ablation, COVID19 with numerous complications during hospitalization 03/2020-04/2020    Reason for Visit: Follow-up     Cardiovascular History:    Previously evaluated by Dr Von and last saw him 05/2019. He has a history of CABG and aflutter s/p ablation. Ziopatch 06/2019 did not show recurrence of aflutter. Prolonged hospitalization at [facility] from 03/17/20 to 04/17/20 for COVID19 with hypoxic respiratory failure due to ARDS, bilateral subsegmental PE, GI bleed requiring transfusion, NSTEMI, afib, L occipital lobe CVA.    Presented to [facility] ER 07/29/20 after an episode of syncope. ECG showed sinus rhythm with RBBB. A ziopatch was ordered. Ziopatch showed 16 short SVT episodes, appear to be atrial tach.    12/2020 right sided rib fractures    02/26/21 ER visit for afib with RVR and HTN. Prescribed prn diltiazem  by ER in addition to BID metoprolol .    04/2021 PET stress for evaluation of CP, showing large reversible apical/anterior/septal defect. Referred for [facility]): interval proximal occlusion of the left main, LIMA is patent but mid LAD occlusion prevents backfilling of the proximal LAD. Unsuccessful attempt to pass a wire through CTO left main. All grafts patent. RAS 70% on the R. Also found to have differential of >40mmHg SBP/DBP between L and R arm.     05/2021 reviewed case with Dr's Patricia Ship. Plan for treatment of RAS. Do not think coronary intervention will be worth the risk. S/p DES to right renal artery. Angiography also showed right subclavian stenosis.    Interval History:    5-6 episodes of chest pain requiring nitroglycerin     Too hot to be active outside      Review of Systems  10 systems were reviewed and negative except as noted in HPI.    Cardiovascular History and Pertinent Past Medical History:  CAD s/p CABG   Aflutter s/p ablation  Afib  COVID19 03/2020 with prolonged hospitalization  Pulmonary embolism 03/2020  CVA 03/2020  RAS s/p R renal artery PCI 05/2021  R subclavian stenosis     has a past medical history of Acute hypoxemic respiratory failure (03/17/2020), Alcoholism, Anxiety, Arthritis, At risk for falls, B12 deficiency, CHF (congestive heart failure), Coronary artery disease, Diabetes mellitus, Financial difficulties, Hearing impairment, Heart disease, Hyperlipemia, Hypertension, Joint pain, Major depressive disorder, Memory changes (10/10/2022), Peptic ulceration, PONV (postoperative nausea and vomiting), Renal disorder, and Stage 3a chronic kidney disease (CMS-HCC) (07/11/2014).    Prior Cardiovascular Interventions / Surgery:  CABG 2006 LIMA-LAD,  SVG-OM, SVG-PDA  Aflutter ablation 2016, and 2017  R renal artery PCI 05/2021 [place]    Prior Cardiovascular Diagnostics:    ECG   06/28/20 sinus rhythm 77bpm, RBBB with non-specific ST/TW abnormalities  02/26/21 afib 120bpm, RBBB  03/05/21 sinus rhythm with PACs, RBBB  04/04/21 sinus rhythm with PACs, RBBB  07/04/21 sinus rhythm, RBBB  10/21/23 sinus rhythm, right axis, RBBB, PACs    Echo 04/11/20 EF normal  Echo 12/2020 normal EF, normal RV  Echo 10/2021 normal EF    Ziopatch 07/2020  - 16 short  SVT episodes were recorded, the longest lasting 7.7 seconds. These appear to be atrial tachycardia.   - Otherwise unremarkable 14 day ambulatory cardiac monitor without patient triggered events.     PET Stress 04/2021  - Abnormal myocardial perfusion study  - There is a large in size, moderately severe, completely reversible perfusion defect involving the apical, apical anterior, mid anterior, basal anterior, apical septal, mid anteroseptal, basal anteroseptal, mid inferoseptal and basal inferoseptal segments. This is consistent with probable ischemia.  - Left ventricular systolic function is normal. Post stress the ejection fraction is > 60%.  - Coronary and aortic calcifications are noted  - Aortic valve calcifications are noted  - Attenuation CT scan shows post CABG findings  - Status post cholecystectomy  - Nonspecific bilateral reticular changes in the lungs, including areas of atelectasis    LHC 05/04/21  Small area of lateral wall hypokinesis with preserved LV systolic function.  Coronary artery disease including proximal occlusion of the left main and long 70% lesion in the mid-RCA.  Prior coronary bypass surgery with patent LIMA to distal LAD, patent SVG to OM1 and OM2 and patent SVG to PDA.   70% right renal artery stenosis. Widely patent left renal artery.  Interval change since angiography on 07/14/14 was total occlusion of left main. Proximal LAD was now supplied by collaterals from PDA since occlusion of mid-LAD (present in 2016) prevented backfilling of the proximal LAD from LIMA to LAD graft.  Unsuccessful attempt to pass a wire through totally occluded left main.      Other Surgical History:   has a past surgical history that includes Replacement total knee; Cholecystectomy; Thyroidectomy, partial; Prostate surgery; Foot arthrodesis, triple; Cardiac surgery (2006); Joint replacement; pr upper gi endoscopy,biopsy (N/A, 12/01/2013); pr compre ep eval abltj 3d mapg tx svt (N/A, 05/16/2015); Knee arthroscopy; pr colonoscopy flx dx w/collj spec when pfrmd (N/A, 04/03/2020); pr gi imag intraluminal esophagus-ileum w/i&r (N/A, 04/05/2020); pr cath place/coron angio, img super/interp,w left heart ventriculography (N/A, 05/03/2021); and pr revascularize fem/pop artery,angioplasty/stent (N/A, 06/03/2021).    Current Outpatient Medications   Medication Instructions    acetaminophen  (TYLENOL ) 650 mg, Oral, Every 6 hours PRN    albuterol  HFA 90 mcg/actuation inhaler 2 puffs, Inhalation, Every 6 hours PRN    aspirin (ECOTRIN) 81 mg, Oral, Daily (standard)    blood sugar diagnostic (ONETOUCH VERIO TEST STRIPS) Strp USE TO CHECK BLOOD SUGAR 1-2 TIMES PER DAY    blood-glucose meter kit Check BS every day    cyanocobalamin  (vitamin B-12) 1,000 mcg, Subcutaneous, Every 30 days    dilTIAZem  (CARDIZEM ) 30 MG tablet 1 tab Q3 hr PRN bad palpitations, HR>110.  Be sure top number blood pressure over 95.    donepezil  (ARICEPT ) 5 mg, Oral, Nightly    ELIQUIS  5 mg, Oral, 2 times a day (standard)    empty container (SHARPS-A-GATOR DISPOSAL SYSTEM) Misc Use as directed for sharps disposal    ergocalciferol -1,250 mcg (50,000 unit) (DRISDOL ) 1,250 mcg, Oral, Weekly    evolocumab  (REPATHA  SURECLICK) 140 mg/mL PnIj Inject the contents of 1 pen (140 mg total) under the skin every fourteen (14) days.    fluticasone  propionate (FLONASE ) 50 mcg/actuation nasal spray 2 sprays, Each Nare, Daily (standard)    gabapentin  (NEURONTIN ) 100 mg, Oral, 3 times a day (standard)    gentamicin (GARAMYCIN) 0.1 % cream 1 Application    ipratropium (ATROVENT ) 21 mcg (0.03 %) nasal spray SPRAY 2 SPRAYS INTO EACH NOSTRIL 3 TIMES A DAY  isosorbide  mononitrate (IMDUR ) 60 mg, Oral, Daily (standard)    metoPROLOL  succinate (TOPROL -XL) 200 mg, Oral, Daily (standard)    miscellaneous medical supply Misc Glucometer  Dx: E11.9    miscellaneous medical supply Misc Glucometer test strips   Dx: E11.9    miscellaneous medical supply Misc Lancets  Dx  E11.9    nitroglycerin  (NITROSTAT ) 0.4 mg, Sublingual, Every 5 min PRN    nystatin  (MYCOSTATIN ) 100,000 unit/gram cream Topical, 3 times a day (standard)    pramoxine 1 % Crea Apply up to three times daily. Limit to 7 days consistent use.    ranolazine  (RANEXA ) 500 mg, Oral, 2 times a day (standard)    silodosin  4 mg, Oral, Daily    traZODone  (DESYREL ) 50 MG tablet TAKE 2 TABLETS BY MOUTH EVERY DAY AT NIGHT      Allergies:  is allergic to escitalopram  oxalate, ace inhibitors, escitalopram , and statins-hmg-coa reductase inhibitors.    Social History:  He  reports that he quit smoking about 51 years ago. His smoking use included cigarettes. He started smoking about 66 years ago. He has a 15 pack-year smoking history. He has been exposed to tobacco smoke. He has never used smokeless tobacco. He reports that he does not drink alcohol and does not use drugs.    Family History:  His family history includes Cancer in his brother, brother, brother, father, mother, sister, and sister; Depression in his mother; Heart attack in his brother, father, and mother; Lung cancer in his brother.       OBJECTIVE:      BP 122/70 (BP Site: L Arm, BP Position: Sitting, BP Cuff Size: Medium)  - Pulse 95  - Temp 36.6 ??C (97.9 ??F) (Temporal)  - Resp 18  - Wt 97.3 kg (214 lb 6.4 oz)  - SpO2 96%  - BMI 29.07 kg/m??      Wt Readings from Last 12 Encounters:   10/21/23 97.3 kg (214 lb 6.4 oz)   09/09/23 97.5 kg (215 lb)   06/09/23 98.9 kg (218 lb)   06/01/23 (!) 100.8 kg (222 lb 3.2 oz)   05/18/23 98.4 kg (217 lb)   04/06/23 99.8 kg (220 lb)   03/16/23 100.2 kg (221 lb)   03/11/23 100.2 kg (221 lb)   01/29/23 96.8 kg (213 lb 6.4 oz)   12/10/22 98.7 kg (217 lb 8 oz)   11/10/22 97.8 kg (215 lb 8 oz)   10/29/22 97.9 kg (215 lb 12.8 oz)     BP Readings from Last 5 Encounters:   10/21/23 122/70   09/09/23 136/74   06/09/23 130/70   06/01/23 144/78   04/06/23 130/78     Pulse Readings from Last 3 Encounters:   10/21/23 95   09/09/23 67   06/09/23 57       General:  Alert, no distress.   Eyes:  EOMI, sclerae anicteric   Neck: No carotid bruit. JVP is not visible sitting upright   Resp:   CTAB bilaterally with normal WOB.   CV:  RRR, normal s1 and s2, no murmurs, normal radial pulses b/l, mild LE edema   GI:   Abdomen soft, non-tender   MSK: Normal muscle tone, no joint swelling/effusion   Skin: No lesions/rashes.   Neuro: No focal deficits.   Psych: Normal mood, normal affect     Lab Results   Component Value Date    HGB 13.8 09/09/2023    HGB 14.7 03/11/2023    HGB 14.2 01/29/2023  PLT 259 09/09/2023    PLT 285 03/11/2023    PLT 275 01/29/2023     Lab Results   Component Value Date    CREATININE 1.20 09/09/2023    CREATININE 1.40 (H) 03/11/2023    CREATININE 1.40 (H) 01/29/2023    K 4.6 09/09/2023    K 4.7 03/11/2023    K 5.3 (H) 01/29/2023      Lab Results   Component Value Date    BNP 408 (H) 03/18/2020     Lab Results   Component Value Date    PROBNP 541.0 (H) 01/16/2022    PROBNP 281.0 04/05/2021    PROBNP 3,890.0 (H) 02/26/2021     Lab Results   Component Value Date    CHOL 137 09/09/2023    CHOL 105 10/08/2022    LDL 59 09/09/2023    LDL 24 (L) 10/08/2022    HDL 46 09/09/2023    HDL 52 10/08/2022    TRIG 775 (H) 09/09/2023    TRIG 143 10/08/2022     Lab Results   Component Value Date    A1C 7.2 (A) 09/09/2023

## 2023-10-21 NOTE — Unmapped (Addendum)
 Today we discussed:  Angina    Recommendations:  We can try ranexa  500mg  twice a day to see if that helps with chest pain  Call [facility] for repatha  to see what the issue is - there is a note saying you were approved for it. [phone]     Follow-up:  Return in about 6 months (around 04/22/2024).    Thank you for the opportunity to participate in your health care. You may receive a survey about your recent visit via MyChart, text message, or mail.  Please take a moment to complete this survey.  We take your feedback very seriously and will use it to guide our continuous improvement efforts.    [name], MD, PhD  [facility]  Cardiology

## 2023-11-05 DIAGNOSIS — G4700 Insomnia, unspecified: Principal | ICD-10-CM

## 2023-11-05 DIAGNOSIS — R413 Other amnesia: Principal | ICD-10-CM

## 2023-11-05 MED ORDER — TRAZODONE 50 MG TABLET
ORAL_TABLET | ORAL | 1 refills | 0.00000 days | Status: CP
Start: 2023-11-05 — End: ?

## 2023-11-05 MED ORDER — DONEPEZIL 5 MG TABLET
ORAL_TABLET | Freq: Every evening | ORAL | 1 refills | 90.00000 days | Status: CP
Start: 2023-11-05 — End: ?

## 2023-11-05 NOTE — Unmapped (Signed)
 Last refill 5.3.25  180 quantity 0 refills     Patient is requesting the following refill  Requested Prescriptions     Pending Prescriptions Disp Refills    traZODone  (DESYREL ) 50 MG tablet [Pharmacy Med Name: TRAZODONE  50 MG TABLET] 180 tablet 1     Sig: TAKE 2 TABLETS BY MOUTH EVERY DAY AT NIGHT    donepezil  (ARICEPT ) 5 MG tablet [Pharmacy Med Name: DONEPEZIL  HCL 5 MG TABLET] 90 tablet 1     Sig: TAKE 1 TABLET BY MOUTH EVERY DAY AT NIGHT       Recent Visits  Date Type Provider Dept   10/21/23 Office Visit Margaree Spruce, MD [facility] Cardiology Primary Care [facility]   09/09/23 Office Visit Baldwin Alm Lever, MD [place] [facility] At [facility]   06/09/23 Office Visit Baldwin Alm Lever, MD [place] [facility] Care At [facility]   06/01/23 Office Visit Margaree Spruce, MD [facility] Cardiology Primary Care [facility]   04/06/23 Office Visit Baldwin Alm Lever, MD [place] [facility] At [facility]   03/11/23 Office Visit Baldwin Alm Lever, MD [place] Primary Care At [facility]   01/29/23 Office Visit Margaree Spruce, MD [facility] Cardiology Primary Care [facility]   12/10/22 Office Visit Baldwin Alm Lever, MD [place] Primary Care At [facility]   11/10/22 Office Visit Baldwin Alm Lever, MD [place] Primary Care At [facility]   Showing recent visits within past 365 days and meeting all other requirements  Future Appointments  Date Type Provider Dept   12/10/23 Appointment Baldwin Alm Lever, MD [place] Primary Care At [facility]   04/28/24 Appointment Margaree Spruce, MD [place] Cardiology Primary [facility]   Showing future appointments within next 365 days and meeting all other requirements       Labs: Not applicable this refill

## 2023-12-10 DIAGNOSIS — I240 Acute coronary thrombosis not resulting in myocardial infarction: Principal | ICD-10-CM

## 2023-12-10 DIAGNOSIS — M79642 Pain in left hand: Principal | ICD-10-CM

## 2023-12-10 DIAGNOSIS — R2231 Localized swelling, mass and lump, right upper limb: Principal | ICD-10-CM

## 2023-12-10 DIAGNOSIS — N1832 Type 2 diabetes mellitus with stage 3b chronic kidney disease, without long-term current use of insulin    (CMS-HCC): Principal | ICD-10-CM

## 2023-12-10 DIAGNOSIS — M5412 Radiculopathy, cervical region: Principal | ICD-10-CM

## 2023-12-10 DIAGNOSIS — Z789 Other specified health status: Principal | ICD-10-CM

## 2023-12-10 DIAGNOSIS — E1169 Type 2 diabetes mellitus with other specified complication: Principal | ICD-10-CM

## 2023-12-10 DIAGNOSIS — I259 Chronic ischemic heart disease, unspecified: Principal | ICD-10-CM

## 2023-12-10 DIAGNOSIS — M25521 Pain in right elbow: Principal | ICD-10-CM

## 2023-12-10 DIAGNOSIS — E1122 Type 2 diabetes mellitus with diabetic chronic kidney disease: Principal | ICD-10-CM

## 2023-12-10 MED ORDER — REPATHA SURECLICK 140 MG/ML SUBCUTANEOUS PEN INJECTOR
SUBCUTANEOUS | 4 refills | 84.00000 days | Status: CP
Start: 2023-12-10 — End: ?

## 2023-12-16 NOTE — Unmapped (Signed)
 Assessment/Plan     Diagnoses and all orders for this visit:    Type 2 diabetes mellitus with stage 3b chronic kidney disease, without long-term current use of insulin    (CMS-HCC)  -     POCT glycosylated hemoglobin (Hb A1C)  Stable overall. Well controlled overall. Continue current regimen.    Pain of left hand  Ongoing symptoms.  Standard education guidance.  Recommend EMG to evaluate for distal peripheral nerve entrapment versus cervical origin.  -     EMG; Future    Right elbow pain  Ongoing symptoms in the setting of mass of the right elbow.  Stand education guidance.  Conservative measures.  MRI of the cervical spine, MRI of the right upper extremity elbow region and EMG.  -     MRI Cervical Spine Wo Contrast; Future  -     EMG; Future    Cervical radiculopathy  Standard education and anticipatory guidance regarding goals, objectives, and sequelae.  -     MRI Upper Extremity Joint Right Wo Contrast; Future  -     EMG; Future    Elbow mass, right  Standard education and anticipatory guidance regarding goals, objectives, and sequelae.  -     MRI Cervical Spine Wo Contrast; Future    Type 2 diabetes mellitus with other specified complication, without long-term current use of insulin    (CMS-HCC)  Diabetes is stable.  Recommend improvement in atherosclerotic risk reduction profile with addition of Repatha  given concern for statin intolerance.  -     evolocumab  (REPATHA  SURECLICK) 140 mg/mL PnIj; Inject the contents of 1 pen (140 mg total) under the skin every fourteen (14) days.    Ischemic heart disease due to coronary artery obstruction    (CMS-HCC)  Currently clinically stable.  Standard education and anticipatory guidance regarding goals, objectives, and sequelae.  Conservative measures and precautions.  Aggressive risk factor reduction reviewed.  Given statin intolerance recommend Repatha .  -     evolocumab  (REPATHA  SURECLICK) 140 mg/mL PnIj; Inject the contents of 1 pen (140 mg total) under the skin every fourteen (14) days.    Statin intolerance  Standard education and anticipatory guidance regarding goals, objectives, and sequelae.  Recommend Repatha .  -     evolocumab  (REPATHA  SURECLICK) 140 mg/mL PnIj; Inject the contents of 1 pen (140 mg total) under the skin every fourteen (14) days.      Disposition: Return in about 3 months (around 03/10/2024) for Recheck, DM, Memory.  03/10/2024      Diagnoses and plan along with any newly prescribed medication(s), recommendations, orders and therapies were discussed in detail with patient today.   Standard education and anticipatory guidance regarding goals, objectives and potential sequelae were reviewed. Patient voiced appropriate understanding of assessment and plan of care.    Pertinent handouts were given today and reviewed with the patient as indicated.  The Care Plan and Self-Management goals have been included on the AVS and the AVS has been printed.  Where helpful for monitoring and management I have encouraged the patient to keep regular logs for me to review at their next visit. Any outside resources or referrals needed at this time are noted above. No additional referrals necessary at this time. Patient voiced understanding and all questions have been answered to satisfaction.     To complete this documentation in a timely manner, portions of this encounter may have been completed utilizing Dragon voice recognition software. Please take this into consideration in noting nuances  of documentation, grammar, punctuation, and wording. If and when a question exists, or for clarification, please contact the author.    Alm Eagles, MD  [facility]  [address]  [place], [name] [address]  Phone [phone]    Subjective:     ERE:Hpadnw, Alm Lever, MD    Jesus Rubio, is a 78 y.o. male    Chief Complaint   Patient presents with    Diabetes     Sometimes checks sugars at home, ranged between 130-170 pet pt. Taking medications. Has upcoming eye exam per pt.    Extremity Pain     Left arm, ongoing numbness getting worse per pt. Also right elbow pain.       HPI:    Patient presents: Alone.    Nursing notes and history reviewed with patient.    Diabetes mellitus type 2.  Goal A1c less than 8.  No specific hypo or hyperglycemic symptoms.  Compliant medication regimen denies side effects.    Pain in the left hand ongoing.  Feels symptoms are progressive.    Right elbow pain.  No in the setting of history of right elbow mass.  No new erythema warmth.  Area feels like it swells sometimes.  Prior evaluation and imaging reviewed.    Pain in his neck.  Question cervical radiculopathy.  Worse with movement of the cervical spine.    Stage III chronic kidney disease.  No urinary tract symptoms, shortness of breath, abdominal swelling, lower extremity edema, orthopnea, pnd.  Compliant with renal protection recommendations.    Ischemic heart disease. Compliant with therapy. No chest pain, shortness of breath, orthopnea, PND.  No increase nitroglycerin  use.  No bleeding complications from aspirin therapy noted.    Intolerant of statin therapy.    No other complaints.  No additional recent health care visits elsewhere. No urgent care or ED visits.  No additional palliative, provocative, modifying, quantifying, qualifying, or other related factors.  Unless otherwise outlined above patient is pleased with current medication regimen, admits compliance and denies side effects of medication.    The following portions of the patient's history were reviewed and updated as appropriate: allergies, current medications, past family history, past medical history, past social history, past surgical history and problem list.    ROS: Pertinent positives and negatives are as outlined in the HPI above. The remaining balance of the 12 point review of systems is otherwise benign.      PCMH Components:      Goals         <130/80       Lack of exercise or physical activity    Increase exercise or physical activity        Get heart rate stabilized (pt-stated)         Things to think about to help me reach my goal:     What are you going to do? Take medications as prescribed, Keep positive thinking, Pace activities, Stay hydrated   How and how much? Daily   How frequent? Daily   Barriers to success?    Solutions to barriers?      06/04/17: Patient continues to work on this goal   09/29/17 Patient continues to work on this goal   12-09-2017: Reports he continues to work on this and is staying positive.        Take actions to prevent falling       12-03-2016: New goal: Will change  positions slowly and pay attention to pathways. Uses cane or walker at times.   09/29/17 Patient continues to work on this goal   12-09-2017: Reports 1 fall 6-8 months ago when he missed a step and fell. Denies injury. Uses cane occasionally.        Weight < 90.7 kg (200 lb)       October 07, 2022 11:24 AM AWV Goal   Things to think about to help me reach my goal:     What are you going to do? Lose weight   How and how much? 20 lbs   How frequent? daily   Barriers to success? None identified   Solutions to barriers? N/a                     Family characteristics, patient's social characteristics, patient's cultural characteristics, patient's communication needs, patient's health literacy and behaviors affecting patient's health are outlined under the patient's History Section, Longitudinal Care Plan or Problem List in Epic or as pertinent in History of Present Illness.    Barriers to goals identified and addressed.   See Assessment and Plan for additional pertinent details.    Objective:     Physical Exam   BP 140/88 (BP Site: L Arm, BP Position: Sitting, BP Cuff Size: X-Large)  - Pulse 84  - Temp 36.7 ??C (98.1 ??F) (Oral)  - Wt 97.5 kg (215 lb)  - SpO2 96%  - BMI 29.15 kg/m??   Constitutional: Oriented to person, place, and time. Appears well-developed and well-nourished. No distress.   Head: Normocephalic and atraumatic.   Mouth/Throat: Oropharynx is without mass or concerning lesions. Moist mucous membranes.    Eyes: Conjunctivae are normal. No scleral icterus.   Neck: Neck supple.  Decreased range of motion of cervical spine. No JVD present. No thyromegaly present. No concerning neck mass.   Cardiovascular: Normal rate. Regular rhythm. Normal heart sounds. No murmur heard.  Pulmonary/Chest: Effort normal. No respiratory distress. Breath sounds normal. No wheezes. No rhonchi. No rales. No focal breath sound changes. No chest tenderness.  Abdominal: Soft. Non-distended. No abdominal mass. No tenderness. No rebound. No guarding.   Musculoskeletal: No active synovitis. No concerning joint erythema, warmth or swelling.   Decreased range of motion cervical spine.    Right elbow mass anteriorly/medially.    Left hand pain with positive Tinel and Phalen's.  Neurovascularly intact left hand distally.    Range of motion is otherwise without evidence of significant impairment.   Strength is otherwise without evidence of significant impairment.   No edema of the extremities.   Lymphadenopathy: There is no lymphadenopathy about the head or neck.  Neurological: Alert and oriented to person, place, and time. No cranial nerve deficit. Normal muscle tone. Normal coordination.  Skin: Skin is warm and dry. No pallor. No concerning rash noted.   Psychiatric: Normal mood, affect and behavior. Judgment and thought content normal.

## 2024-01-16 ENCOUNTER — Inpatient Hospital Stay: Admit: 2024-01-16 | Discharge: 2024-01-16 | Payer: Medicare (Managed Care)

## 2024-02-02 NOTE — Progress Notes (Signed)
 Abstraction Result Flowsheet Data    This patient's last AWV date: : 10/07/2022  This patients last WCC/CPE date: : Not Found      Reason for Encounter  Reason for Encounter: Outreach  Primary Reason for Outreach: AWV  Text Message: Yes  MyChart Message: Yes  Outreach Call Outcome: Left Message

## 2024-03-10 DIAGNOSIS — E1142 Type 2 diabetes mellitus with diabetic polyneuropathy: Principal | ICD-10-CM

## 2024-03-10 DIAGNOSIS — R413 Other amnesia: Principal | ICD-10-CM

## 2024-03-10 DIAGNOSIS — M25521 Pain in right elbow: Principal | ICD-10-CM

## 2024-03-10 DIAGNOSIS — M25522 Pain in left elbow: Principal | ICD-10-CM

## 2024-03-10 MED ORDER — BUPROPION HCL XL 150 MG 24 HR TABLET, EXTENDED RELEASE
ORAL_TABLET | Freq: Every morning | ORAL | 2 refills | 30.00000 days | Status: CP
Start: 2024-03-10 — End: 2025-03-10

## 2024-03-10 NOTE — Patient Instructions (Signed)
 Health Maintenance Due   Topic Date Due    Zoster Vaccines (1 of 2) Never done    DTaP/Tdap/Td Vaccines (1 - Tdap) 04/08/2002    Medicare Annual Wellness Visit (AWV)  11/06/2023    COVID-19 Vaccine (1 - 2025-26 season) Never done    Influenza Vaccine (1) 12/07/2023    Foot Exam  04/05/2024           Goals & Recommendations:    Goals and Recommendations related to your health are outlined below.    If discussed and provided at your visit today: Please, keep and record your blood sugar, blood pressure, weight, fluid intake, dietary intake and exercise amount and frequency.     Blood Pressure Goals (unless otherwise instructed):  Check 2-3 times a week.  Typical average out of office measurement: <135 mmHg systolic and < 85 mmHg diastolic (less than 135/85).  More strict recommendations for out of office measurement: < 130 mmHg systolic and < 80 mmHg diastolic (less than 130/80).  Typical in office measurement <140 mmHg systolic and <90 mmHg diastolic (less than 140/90).  Most people will not feel well if blood pressure drops to less than 110/70, unless they typically have blood pressures in this range.   If you are noticing sudden increases or decreases in blood pressure, then please contact the office.    Diabetes: Goal Hemoglobin A1c 7.9 or lower.  Goal Blood Sugars Fasting: 60-140 (may check fasting and two hours after a meal later in the day).    For Monitoring your Mood your PHQ-9 Goal is less than 10.    Drink 48 ounces of water per day.  Avoid sweetened beverages.  You will generally feel better and experience improved health when you limit, and if possible avoid, processed foods.  Diets high in plant based foods, vegetables and fruits are associated with many health benefits.    Exercise for a minimum of 20 minutes daily. Minimum of four days a week.   If possible try to obtain 60 minutes of exercise daily for five or more days per week.  Regular movement throughout the day is best.   Walking is a good form of movement.   Squats, lunges, step-ups, push-ups, planks, and arm exercises with or without weights or resistance bands are good forms of exercise.    If your BMI (body mass index) is above 25 then your body is carrying more weight than is typically healthy.  If your BMI (body mass index) is above 25 and you are not achieving the healthy weight goals that you desire, then consider the following:  Keep calories below 2500 calories per day unless otherwise instructed. Remember that all calories are not equal.  Keep a food journal and record accurately.  Read labels and ingredient lists on the products that you consume.  Sometimes we need to adjust your level of physical activity, and or tailor an exercise program more specifically for your needs, just ask and we will come up with a plan.  If you have questions about any of these recommendations, please ask.    If you smoke, stop.  If you have heart failure or severe kidney disease: weigh yourself daily and maintain fluid intake to less than 2 Liters daily unless otherwise instructed.  If you have diabetes you need to see the eye doctor at least every one to two years and have a yearly foot exam.  Set aside time daily for relaxation.  Plan a pleasant activity that you  can look forward to and enjoy.  Medication changes are outlined on your After Visit Summary.  Any referrals placed today will be outlined on your After Visit Summary.    If you have questions, please call the office or use MyChart.   If you need a refill on a medication, please contact your pharmacy.  Please, allow at least two business days for responses to refill requests.    Please, bring all medications and when possible original prescription containers to each and every office visit.    If you have been provided and / or requested to record your blood sugars, blood pressures, weight, fluid intake, dietary intake, or exercise, then please bring records to each and every appointment.    When you see other providers for health care, please request that they send information and records related to your visit to our office. This includes vaccinations, mammograms, radiology, procedures, diagnostic testing, lab testing and physical exams or wellness exams that you have outside of the [facility].      [facility] at [facility]  [address], [address]  [place], [place]  [address]  Office: [phone]  Fax: [phone]             Patient Education        Preventing Falls: Care Instructions  Injuries and health problems such as trouble walking or poor eyesight can increase your risk of falling. So can some medicines. But there are things you can do to help prevent falls. You can exercise to get stronger. You can also arrange your home to make it safer.    Talk to your doctor about the medicines you take. Ask if any of them increase the risk of falls and whether they can be changed or stopped.   Try to exercise regularly. It can help improve your strength and balance. This can help lower your risk of falling.         Practice fall safety and prevention.   Wear low-heeled shoes that fit well and give your feet good support. Talk to your doctor if you have foot problems that make this hard.  Carry a cellphone or wear a medical alert device that you can use to call for help.  Use stepladders instead of chairs to reach high objects. Don't climb if you're at risk for falls. Ask for help, if needed.  Wear the correct eyeglasses, if you need them.        Make your home safer.   Remove rugs, cords, clutter, and furniture from walkways.  Keep your house well lit. Use night-lights in hallways and bathrooms.  Install and use sturdy handrails on stairways.  Wear nonskid footwear, even inside. Don't walk barefoot or in socks without shoes.        Be safe outside.   Use handrails, curb cuts, and ramps whenever possible.  Keep your hands free by using a shoulder bag or backpack.  Try to walk in well-lit areas. Watch out for uneven ground, changes in pavement, and debris.  Be careful in the winter. Walk on the grass or gravel when sidewalks are slippery. Use de-icer on steps and walkways. Add non-slip devices to shoes.    Put grab bars and nonskid mats in your shower or tub and near the toilet. Try to use a shower chair or bath bench when bathing.   Get into a tub or shower by putting in your weaker leg first. Get out  with your strong side first. Have a phone or medical alert device in the bathroom with you.   Where can you learn more?  Go to [facility] at https://my[facility]chart.[employer]/support/administrative Officer in the Menu. Enter G117 in the search box to learn more about Preventing Falls: Care Instructions.  Current as of: November 05, 2022  Content Version: 14.6  ?? 2024-2025 [place], [place].   Care instructions adapted under license by [facility]. If you have questions about a medical condition or this instruction, always ask your healthcare professional. Romayne Alderman, [facility], disclaims any warranty or liability for your use of this information.

## 2024-03-11 DIAGNOSIS — G629 Polyneuropathy, unspecified: Principal | ICD-10-CM

## 2024-03-11 MED ORDER — GABAPENTIN 100 MG CAPSULE
ORAL_CAPSULE | Freq: Three times a day (TID) | ORAL | 6 refills | 30.00000 days | Status: CP
Start: 2024-03-11 — End: ?

## 2024-03-11 NOTE — Telephone Encounter (Signed)
 Patient is requesting the following refill  Requested Prescriptions     Pending Prescriptions Disp Refills    gabapentin  (NEURONTIN ) 100 MG capsule [Pharmacy Med Name: GABAPENTIN  100 MG CAPSULE] 90 capsule 6     Sig: TAKE 1 CAPSULE BY MOUTH THREE TIMES A DAY.       Recent Visits  Date Type Provider Dept   03/10/24 Office Visit Baldwin Alm Lever, MD [place] [facility]   12/10/23 Office Visit Baldwin Alm Lever, MD [place] [facility]   09/09/23 Office Visit Baldwin Alm Lever, MD [place] [facility]   06/09/23 Office Visit Baldwin Alm Lever, MD [place] [facility]   04/06/23 Office Visit Baldwin Alm Lever, MD [place] [facility]   Showing recent visits within past 365 days and meeting all other requirements  Future Appointments  Date Type Provider Dept   04/05/24 Appointment Baldwin Alm Lever, MD [place] [facility]   Showing future appointments within next 365 days and meeting all other requirements       Labs: Not applicable this refill

## 2024-03-16 NOTE — Progress Notes (Signed)
 Assessment/Plan     Diagnoses and all orders for this visit:    Type 2 diabetes mellitus with diabetic polyneuropathy, without long-term current use of insulin (CMS-HCC)  -     POCT glycosylated hemoglobin (Hb A1C)  Stable overall. Well controlled overall. Continue current regimen.    Memory changes  Impacted by component of anxiety.  Standard education guidance.  Shared decision making to begin Wellbutrin .  -     buPROPion  (WELLBUTRIN  XL) 150 MG 24 hr tablet; Take 1 tablet (150 mg total) by mouth every morning.    Left elbow pain  Ongoing symptoms.  Possible positional.  Standard education guidance.  -     Ambulatory referral to Orthopedic Surgery; Future    Right elbow pain  Ongoing symptoms.  Possible positional.  Standard education guidance.  -     Ambulatory referral to Orthopedic Surgery; Future    Screening for colon cancer  Standard education guidance.  At the age of 12 shared decision making to not pursue future colon cancer screening.    Disposition: Return in about 25 days (around 04/04/2024) for Recheck, Mood, Health Maintenance.  03/11/2024      Diagnoses and plan along with any newly prescribed medication(s), recommendations, orders and therapies were discussed in detail with patient today.   Standard education and anticipatory guidance regarding goals, objectives and potential sequelae were reviewed. Patient voiced appropriate understanding of assessment and plan of care.    Pertinent handouts were given today and reviewed with the patient as indicated.  The Care Plan and Self-Management goals have been included on the AVS and the AVS has been printed.  Where helpful for monitoring and management I have encouraged the patient to keep regular logs for me to review at their next visit. Any outside resources or referrals needed at this time are noted above. No additional referrals necessary at this time. Patient voiced understanding and all questions have been answered to satisfaction.     To complete this documentation in a timely manner, portions of this encounter may have been completed utilizing Dragon voice recognition software. Please take this into consideration in noting nuances of documentation, grammar, punctuation, and wording. If and when a question exists, or for clarification, please contact the author.    Jesus Eagles, MD  [facility]  [address]  [place], [name] [address]  Phone [phone]    Subjective:     ERE:Hpadnw, Jesus Lever, MD    Jesus Rubio, is a 78 y.o. male    Chief Complaint   Patient presents with    Diabetes     Pt does not regularly monitor BP at home.     Follow-up     Pt is wanting to discuss MRI results if possible.        HPI:    Patient presents: Alone.    Nursing notes and history reviewed with patient.    Jesus Rubio presents today in the setting of history of diabetes mellitus type 2 with diabetic polyneuropathy.  Goal A1c less than 8.  No specific hypo or hyperglycemic symptoms.  Denies any diabetic related complications.  Close his diabetic polyneuropathy is stable.    Memory changes.  Noted previously.  On component affecting memory is likely anxiety.  Difficult to focus when he gets anxious.  No current specific pharmacotherapy.  Prior pharmacologic agents that have been prescribed and not tolerated reviewed.    Left elbow pain and right elbow pain.  Ongoing.  Denies erythema warmth or swelling.  Does possibly sit a lot with elbows flexed.  No other concerning joints of erythema warmth or swelling.    Screening for colon cancer.  At the age of 54 reviewed options.  Given age and overall risk-benefit ratio shared decision making not pursue colon cancer screening in the future.    No other complaints.  No additional recent health care visits elsewhere. No urgent care or ED visits.  No additional palliative, provocative, modifying, quantifying, qualifying, or other related factors.  Unless otherwise outlined above patient is pleased with current medication regimen, admits compliance and denies side effects of medication.    The following portions of the patient's history were reviewed and updated as appropriate: allergies, current medications, past family history, past medical history, past social history, past surgical history and problem list.    ROS: Pertinent positives and negatives are as outlined in the HPI above. The remaining balance of the 12 point review of systems is otherwise benign.      PCMH Components:      Goals         <130/80       Lack of exercise or physical activity    Increase exercise or physical activity        Get heart rate stabilized (pt-stated)         Things to think about to help me reach my goal:     What are you going to do? Take medications as prescribed, Keep positive thinking, Pace activities, Stay hydrated   How and how much? Daily   How frequent? Daily   Barriers to success?    Solutions to barriers?      06/04/17: Patient continues to work on this goal   09/29/17 Patient continues to work on this goal   12-09-2017: Reports he continues to work on this and is staying positive.        Take actions to prevent falling       12-03-2016: New goal: Will change positions slowly and pay attention to pathways. Uses cane or walker at times.   09/29/17 Patient continues to work on this goal   12-09-2017: Reports 1 fall 6-8 months ago when he missed a step and fell. Denies injury. Uses cane occasionally.        Weight < 90.7 kg (200 lb)       October 07, 2022 11:24 AM AWV Goal   Things to think about to help me reach my goal:     What are you going to do? Lose weight   How and how much? 20 lbs   How frequent? daily   Barriers to success? None identified   Solutions to barriers? N/a                     Family characteristics, patient's social characteristics, patient's cultural characteristics, patient's communication needs, patient's health literacy and behaviors affecting patient's health are outlined under the patient's History Section, Longitudinal Care Plan or Problem List in Epic or as pertinent in History of Present Illness.    Barriers to goals identified and addressed.   See Assessment and Plan for additional pertinent details.    Objective:     Physical Exam   BP 128/72 (BP Position: Sitting, BP Cuff Size: Large)  - Pulse 65  - Temp 36.7 ??C (98.1 ??F) (Oral)  - Resp 18  - Ht 182.9 cm (6' 0.01)  - Wt 95.9 kg (211  lb 8 oz)  - SpO2 95%  - BMI 28.68 kg/m??   Constitutional: Oriented to person, place, and time. Appears well-developed and well-nourished. No distress.   Head: Normocephalic and atraumatic.   Mouth/Throat: Oropharynx is without mass or concerning lesions. Moist mucous membranes.    Eyes: Conjunctivae are normal. No scleral icterus.   Neck: Neck supple. Normal range of motion. No JVD present. No thyromegaly present. No concerning neck mass.   Cardiovascular: Normal rate. Regular rhythm. Normal heart sounds. No murmur heard.  Pulmonary/Chest: Effort normal. No respiratory distress. Breath sounds normal. No wheezes. No rhonchi. No rales. No focal breath sound changes. No chest tenderness.  Abdominal: Soft. Non-distended. No abdominal mass. No tenderness. No rebound. No guarding.   Musculoskeletal: No active synovitis. No concerning joint erythema, warmth or swelling.   Tenderness posteriorly along bilateral elbows symmetric.    Range of motion is otherwise without evidence of significant impairment.   Strength is without evidence of significant impairment.   No edema of the extremities.   Lymphadenopathy: There is no lymphadenopathy about the head or neck.  Neurological: Alert and oriented to person, place, and time. No cranial nerve deficit. Normal muscle tone. Normal coordination.  Skin: Skin is warm and dry. No pallor. No concerning rash noted.   Psychiatric: Normal mood, affect and behavior. Judgment and thought content normal.

## 2024-03-24 DIAGNOSIS — M4802 Spinal stenosis, cervical region: Principal | ICD-10-CM

## 2024-03-24 DIAGNOSIS — G5621 Lesion of ulnar nerve, right upper limb: Principal | ICD-10-CM

## 2024-03-24 DIAGNOSIS — M5412 Radiculopathy, cervical region: Principal | ICD-10-CM

## 2024-03-24 DIAGNOSIS — M5030 Other cervical disc degeneration, unspecified cervical region: Principal | ICD-10-CM

## 2024-03-24 NOTE — Progress Notes (Signed)
 SPORTS MEDICINE CONSULTATION VISIT    ASSESSMENT AND PLAN      Diagnosis ICD-10-CM Associated Orders   1. Ulnar neuropathy at elbow of right upper extremity  G56.21       2. DDD (degenerative disc disease), cervical  M50.30 Ambulatory referral to Spine Center      3. Cervical radiculopathy  M54.12 Ambulatory referral to Spine Center      4. Cervical spinal stenosis  M48.02 Ambulatory referral to Spine Center           Discussed with him that I recommend getting up to date [place]/EMGs, will discuss this with his wife and reach out if he wants me to order, advised that he may need to hold his Eliquis  prior to [place]/EMGs. He doubts he would have surgery on his elbow if his ulnar nerve is compression on EMG, so unsure if he will get the [place]/EMGs.   Referral placed to Spine Center to follow up on discussion about cervical ESI for pain relief.      Return if symptoms worsen or fail to improve.    Procedure(s):  none      SUBJECTIVE     Chief Complaint: No chief complaint on file.       History of Present Illness: 78 y.o. left-handed male  has a past medical history of Acute hypoxemic respiratory failure    (CMS-HCC) (03/17/2020), Alcoholism    (CMS-HCC), Anxiety, Arthritis, At risk for falls, B12 deficiency, CHF (congestive heart failure) (CMS-HCC), Coronary artery disease, Diabetes mellitus (CMS-HCC), Financial difficulties, Hearing impairment, Heart disease, Hyperlipemia, Hypertension, Joint pain, Major depressive disorder, Memory changes (10/10/2022), Peptic ulceration, PONV (postoperative nausea and vomiting), Renal disorder, and Stage 3a chronic kidney disease (CMS-HCC) (07/11/2014)., who presents for right elbow pain. History limited, attributes to difficulty with memory. Symptoms x 2-3 years. No injury. Localizes pain to the right little finger, radiates to ulnar forearm and medial elbow. Noticed a mass over his medial elbow. Worse when he rests his elbow on a hard surface in a flexed position, prefers to have elbow in extended position and resting on a pillow. Previously by Dr. Knoll, ultrasound showed no mass, MRI 2 months ago showed mild DJD, mild common flexor and extensor tendinosis and ill defined soft tissue edema in posterior elbow, no mass, cubital tunnel release with ulnar nerve translocation offered 2 years ago, unclear why he did not go forward with surgery.  Also previously saw Dr. Nancyann in 2022, referred to PT, mentioned ESI if PT not helpful, did not get ESI. Also been to Pain Management in the past, unclear why he no longer goes. NSAIDs contraindicated because of heart disease and on Eliquis . Taking Tylenol  which has not helped. Unaccompanied to visit today.   Most recent A1c 6.56% on 03/10/2024      Past Medical History: Past Medical History[1]  Current Medications: has a current medication list which includes the following prescription(s): acetaminophen , albuterol , aspirin, onetouch verio test strips, blood-glucose meter, bupropion , cyanocobalamin  (vitamin b-12), diltiazem , donepezil , eliquis , empty container, ergocalciferol , repatha  sureclick, fluticasone  propionate, gabapentin , gentamicin, ipratropium, isosorbide  mononitrate, metoprolol  succinate, miscellaneous medical supply, miscellaneous medical supply, miscellaneous medical supply, nitroglycerin , nystatin , pramoxine, ranolazine , silodosin , and trazodone .    Work/school/SH:    OBJECTIVE     Physical Exam:  Vitals:   Wt Readings from Last 3 Encounters:   03/10/24 95.9 kg (211 lb 8 oz)   12/10/23 97.5 kg (215 lb)   10/21/23 97.3 kg (214 lb 6.4 oz)     Estimated  body mass index is 28.68 kg/m?? as calculated from the following:    Height as of 03/10/24: 182.9 cm (6' 0.01).    Weight as of 03/10/24: 95.9 kg (211 lb 8 oz).  Gen: Well-appearing male in no acute distress  MSK: right elbow: mild edema medial elbow. TTP over medial and lateral epicondyle, olecranon and insertion of triceps. ROM 3-120. Motor nerves intact, no weakness with ulnar nerve motor testing. + Tinnel at the elbow. No UE atrophy. Wrist ROM full and painless. Able to make composite fist.     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   PCP ordered [place]/EMGs 12/2023, which he did not get  MRI of the cervical spine from 01/16/2024 shows mild spinal stenosis with moderate to severe neuroforaminal narrowing from C4-T1.   MRI of the right elbow from 140/13/2025 shows mild elbow DJD, mild common flexor and extensor tendinosis and ill defined soft tissue edema in posterior elbow, no mass.  No xrays today.         ADMINISTRATIVE     I have personally reviewed and interpreted the images (as available).  Point-of-care ultrasound imaging is on file and stored in a permanent location (if performed).  I have personally reviewed prior records and incorporated relevant information above (as available).    MEDICAL DECISION MAKING (level of service defined by 2/3 elements)     Number/Complexity of Problems Addressed 1 or more chronic illnesses with exacerbation, progression, or side effects of treatment (99204/99214)   Amount/Complexity of Data to be Reviewed/Analyzed 3 points: Review prior notes (1 point per unique source); Review test results (1 point per unique test); Order tests (1 point per unique test); Assessment requiring an independent historian (99204/99214)   Risk of Complications/Morbidity/Mortality of Management Over-the-counter Medications (99203/99213)     TIME     Total Time for E/M Services on the Date of Encounter [phone] - I personally spent 40-54 minutes face-to-face and non-face-to-face in the care of this patient, excluding time spent during separately reported procedures, but including all pre, intra, and post visit time on the date of service.     CONSULTATION     Consultation services provided YES - Consultation was performed at the request of  Alm Eagles. to whom my opinion and all services ordered and performed were communicated via written report.     MODIFIER 25 (Significant, Separately Billable Evaluation and Management)     Documentation to ensure appropriate insurance payment for medically necessary work    Per the [employer] (rev. 04/07/2021) Chapter 13, Section B. Evaluation & Management (E&M) Services, paragraph 5:   ???In general, E&M services on the same date of service as the minor surgical procedure are included in the payment for the procedure???However, a significant and separately identifiable E&M service unrelated to the decision to perform the minor surgical procedure is separately reportable with modifier 25. The E&M service and minor surgical procedure do not require different diagnoses.???    Per the American Medical Association in Reporting CPT Modifier 25 [CPT??Geophysicist/field Seismologist (Online). 2023;33(11):1-12.] Page 1, Appropriate Use, paragraph 1:   ???Modifier 25 is used to indicate that a patient's condition required a significant, separately identifiable evaluation and management (E/M) service above and beyond that associated with another procedure or  service being reported by the same physician or other qualified health care professional [facility]) on the same date. This service must be above and beyond the other service provided or beyond the usual preoperative and postoperative care associated with the procedure or service that was performed on that same date, and it must be substantiated by documentation in the patient's record that satisfies the relevant criteria for the respective E/M service to be reported.???    Per the American Medical Association in Reporting CPT Modifier 25 [CPT??Geophysicist/field Seismologist (Online). 2023;33(11):1-12.] Page 2, Considerations, bullet point 2 (Requires awareness of usual preoperative and postoperative services):   ???Pre- and post-operative services typically associated with a procedure include the following and cannot be reported with a separate E/M services code:   Review of patient's relevant past medical history,  Assessment of the problem area to be treated by surgical or other service,  Formulation and explanation of the clinical diagnosis,  Review and explanation of the procedure to the patient, family, or caregiver,  Discussion of alternative treatments or diagnostic options,  Obtaining informed consent,  Providing postoperative care instructions,  Discussion of any further treatment and follow up after the procedure???    As the service provider for this encounter, I attest that the patient's condition required a significant, separately identifiable, medical necessary evaluation and management service in addition to the procedure performed on the same date of service. The evaluation and management service was above and beyond the usual preoperative and postoperative care associated with the procedure. The specific elements of the encounter that represent evaluation and management service above and beyond the usual preoperative and postoperative care associated with the procedure include, but are not limited to:          PROCEDURES     Procedures     DME     DME ORDER:  Dx:  ,                            [1]   Past Medical History:  Diagnosis Date    Acute hypoxemic respiratory failure    (CMS-HCC) 03/17/2020    Acute hypoxemic respiratory failure resolved from PL. 12/26/2020 note, Dr. Blanca, Acute hypoxemic respiratory failure: resolved, patient will monitor oxygen sats with exertion using home pulse ox.  [place]      Alcoholism    (CMS-HCC)     Anxiety     Arthritis     At risk for falls     B12 deficiency     CHF (congestive heart failure) (CMS-HCC)     Coronary artery disease     Diabetes mellitus (CMS-HCC)     Financial difficulties     Hearing impairment     Heart disease     Hyperlipemia     Hypertension     Joint pain     Major depressive disorder     Memory changes 10/10/2022    Peptic ulceration     PONV (postoperative nausea and vomiting)     Renal disorder     Stage 3a chronic kidney disease (CMS-HCC) 07/11/2014    Stage 3a chronic kidney disease - resolved from PL as Type 2 diabetes mellitus with stage 3b chronic kidney disease, without long-term current use of insulin is a combination code that captures both dx.  [place]

## 2024-03-24 NOTE — Patient Instructions (Signed)
 Thank you for choosing [facility]!  We appreciate the opportunity to participate in your care.    There may be research about your Orthopaedic condition, please review this website: [email or web address] to see if you may be a candidate to participate in research    If any questions or concerns arise after your visit, please do not hesitant to contact me by [facility] or by calling:    [facility]: [phone]  OR  Nurse voicemail line: [phone]

## 2024-04-01 DIAGNOSIS — R413 Other amnesia: Principal | ICD-10-CM

## 2024-04-01 MED ORDER — BUPROPION HCL XL 150 MG 24 HR TABLET, EXTENDED RELEASE
ORAL_TABLET | Freq: Every morning | ORAL | 1 refills | 90.00000 days | Status: CP
Start: 2024-04-01 — End: ?

## 2024-04-01 NOTE — Telephone Encounter (Signed)
 Patient is requesting the following refill  Requested Prescriptions     Pending Prescriptions Disp Refills    buPROPion  (WELLBUTRIN  XL) 150 MG 24 hr tablet [Pharmacy Med Name: BUPROPION  HCL XL 150 MG TABLET] 90 tablet 1     Sig: TAKE 1 TABLET BY MOUTH EVERY DAY IN THE MORNING       Recent Visits  Date Type Provider Dept   03/10/24 Office Visit Baldwin Alm Lever, MD [place] [facility]   12/10/23 Office Visit Baldwin Alm Lever, MD [place] [facility]   09/09/23 Office Visit Baldwin Alm Lever, MD [place] [facility]   06/09/23 Office Visit Baldwin Alm Lever, MD [place] [facility]   04/06/23 Office Visit Baldwin Alm Lever, MD [place] [facility]   Showing recent visits within past 365 days and meeting all other requirements  Future Appointments  Date Type Provider Dept   04/05/24 Appointment Baldwin Alm Lever, MD [place] [facility]   Showing future appointments within next 365 days and meeting all other requirements       Labs:

## 2024-04-05 DIAGNOSIS — R413 Other amnesia: Principal | ICD-10-CM

## 2024-04-05 DIAGNOSIS — M25522 Pain in left elbow: Principal | ICD-10-CM

## 2024-04-05 DIAGNOSIS — M25521 Pain in right elbow: Principal | ICD-10-CM

## 2024-04-05 DIAGNOSIS — G4700 Insomnia, unspecified: Principal | ICD-10-CM

## 2024-04-05 MED ORDER — TRAZODONE 50 MG TABLET
ORAL_TABLET | Freq: Every evening | ORAL | 1 refills | 90.00000 days | Status: CP | PRN
Start: 2024-04-05 — End: ?

## 2024-04-05 MED ORDER — BUPROPION HCL XL 150 MG 24 HR TABLET, EXTENDED RELEASE
ORAL_TABLET | Freq: Every morning | ORAL | 1 refills | 90.00000 days | Status: CP
Start: 2024-04-05 — End: ?

## 2024-04-10 NOTE — Progress Notes (Signed)
 Assessment/Plan     Diagnoses and all orders for this visit:    Left elbow pain  Ongoing symptoms.  Concern for ulnar neuropathy versus cervical neuropathy versus other.  Standard education guidance.  Recommend EMG.  -     EMG; Future    Right elbow pain  Ongoing symptoms.  Concern for ulnar neuropathy versus cervical neuropathy versus other.  Standard education guidance.  Recommend EMG.  -     EMG; Future    Insomnia, unspecified type  Clinically stable.    Standard education and anticipatory guidance regarding goals, objectives, and sequelae.  Continue current plan of care.  -     traZODone  (DESYREL ) 50 MG tablet; Take 2 tablets (100 mg total) by mouth nightly as needed for sleep.    Memory changes  Clinically stable.    Standard education and anticipatory guidance regarding goals, objectives, and sequelae.  Continue current plan of care.  -     buPROPion  (WELLBUTRIN  XL) 150 MG 24 hr tablet; Take 1 tablet (150 mg total) by mouth every morning.      Disposition: Return in about 2 months (around 06/04/2024) for Recheck, Mood.  06/06/2024      Diagnoses and plan along with any newly prescribed medication(s), recommendations, orders and therapies were discussed in detail with patient today.   Standard education and anticipatory guidance regarding goals, objectives and potential sequelae were reviewed. Patient voiced appropriate understanding of assessment and plan of care.    Pertinent handouts were given today and reviewed with the patient as indicated.  The Care Plan and Self-Management goals have been included on the AVS and the AVS has been printed.  Where helpful for monitoring and management I have encouraged the patient to keep regular logs for me to review at their next visit. Any outside resources or referrals needed at this time are noted above. No additional referrals necessary at this time. Patient voiced understanding and all questions have been answered to satisfaction.     To complete this documentation in a timely manner, portions of this encounter may have been completed utilizing Dragon voice recognition software. Please take this into consideration in noting nuances of documentation, grammar, punctuation, and wording. If and when a question exists, or for clarification, please contact the author.    Alm Eagles, MD  [facility] at [facility]  [address]  [place], [name] [address]  Phone [phone]    Subjective:     ERE:Hpadnw, Alm Lever, MD    Jesus Rubio, is a 79 y.o. male    Chief Complaint   Patient presents with    mood check    Follow-up     No new concerns per patient.        HPI:    Patient presents: Alone.    Nursing notes and history reviewed with patient.    [name] presents for follow-up with bilateral elbow pain left and right ongoing.  Notes and relevant data reviewed.  Minimal change in symptomatology.  No weakness of bilateral hands.  No new concerning neck pain.  No lower extremity weakness.  Denies additional symptoms of concern.  Past orthopedic notes reviewed.    Insomnia and memory changes in the setting of anxiety and depression.  Noted previously.  Prescribed Wellbutrin .  Notes some benefit with this.  Not sure he wants to increase dose.  Denies significant side effects or complications with use.  Memory has been fairly stable recently.  No other complaints.  No additional recent health care visits elsewhere. No urgent care or ED visits.  No additional palliative, provocative, modifying, quantifying, qualifying, or other related factors.  Unless otherwise outlined above patient is pleased with current medication regimen, admits compliance and denies side effects of medication.    The following portions of the patient's history were reviewed and updated as appropriate: allergies, current medications, past family history, past medical history, past social history, past surgical history and problem list.    ROS: Pertinent positives and negatives are as outlined in the HPI above. The remaining balance of the 12 point review of systems is otherwise benign.      PCMH Components:      Goals         <130/80       Lack of exercise or physical activity    Increase exercise or physical activity        Get heart rate stabilized (pt-stated)         Things to think about to help me reach my goal:     What are you going to do? Take medications as prescribed, Keep positive thinking, Pace activities, Stay hydrated   How and how much? Daily   How frequent? Daily   Barriers to success?    Solutions to barriers?      06/04/17: Patient continues to work on this goal   09/29/17 Patient continues to work on this goal   12-09-2017: Reports he continues to work on this and is staying positive.        Take actions to prevent falling       12-03-2016: New goal: Will change positions slowly and pay attention to pathways. Uses cane or walker at times.   09/29/17 Patient continues to work on this goal   12-09-2017: Reports 1 fall 6-8 months ago when he missed a step and fell. Denies injury. Uses cane occasionally.        Weight < 90.7 kg (200 lb)       October 07, 2022 11:24 AM AWV Goal   Things to think about to help me reach my goal:     What are you going to do? Lose weight   How and how much? 20 lbs   How frequent? daily   Barriers to success? None identified   Solutions to barriers? N/a                     Family characteristics, patient's social characteristics, patient's cultural characteristics, patient's communication needs, patient's health literacy and behaviors affecting patient's health are outlined under the patient's History Section, Longitudinal Care Plan or Problem List in Epic or as pertinent in History of Present Illness.    Barriers to goals identified and addressed.   See Assessment and Plan for additional pertinent details.    Objective:     Physical Exam   BP 126/76 (BP Position: Sitting, BP Cuff Size: Large)  - Pulse 73  - Temp 36.4 ??C (97.6 ??F) (Oral)  - Resp 18  - Ht 182.9 cm (6' 0.01)  - Wt 95.7 kg (211 lb)  - SpO2 97%  - BMI 28.61 kg/m??   Constitutional: Oriented to person, place, and time. Appears well-developed and well-nourished. No distress.   Head: Normocephalic and atraumatic.   Mouth/Throat: Oropharynx is without mass or concerning lesions. Moist mucous membranes.    Eyes: Conjunctivae are normal. No scleral icterus.   Neck: Neck supple. Normal range of  motion. No JVD present. No thyromegaly present. No concerning neck mass.   Cardiovascular: Normal rate. Regular rhythm. Normal heart sounds. No murmur heard.  Pulmonary/Chest: Effort normal. No respiratory distress. Breath sounds normal. No wheezes. No rhonchi. No rales. No focal breath sound changes. No chest tenderness.  Abdominal: Soft. Non-distended. No abdominal mass. No tenderness. No rebound. No guarding.   Musculoskeletal: No active synovitis. No concerning joint erythema, warmth or swelling.   Tenderness posteriorly along bilateral elbows symmetric.    Range of motion is otherwise without evidence of significant impairment.   Strength is without evidence of significant impairment.   No edema of the extremities.   Lymphadenopathy: There is no lymphadenopathy about the head or neck.  Neurological: Alert and oriented to person, place, and time. No cranial nerve deficit. Normal muscle tone. Normal coordination.  Skin: Skin is warm and dry. No pallor. No concerning rash noted.   Psychiatric: Normal mood, affect and behavior. Judgment and thought content normal.

## 2024-04-22 MED ORDER — ELIQUIS 5 MG TABLET
ORAL_TABLET | Freq: Two times a day (BID) | ORAL | 6 refills | 30.00000 days | Status: CP
Start: 2024-04-22 — End: ?

## 2024-04-22 NOTE — Telephone Encounter (Signed)
 Patient is requesting the following refill  Requested Prescriptions     Pending Prescriptions Disp Refills    ELIQUIS  5 mg Tab [Pharmacy Med Name: ELIQUIS  5 MG TABLET] 60 tablet 6     Sig: TAKE 1 TABLET BY MOUTH TWO TIMES A DAY.       Recent Visits  Date Type Provider Dept   04/05/24 Office Visit Baldwin Alm Lever, MD [place] [facility]   03/10/24 Office Visit Baldwin Alm Lever, MD [place] [facility]   12/10/23 Office Visit Baldwin Alm Lever, MD [place] [facility]   09/09/23 Office Visit Baldwin Alm Lever, MD [place] [facility]   06/09/23 Office Visit Baldwin Alm Lever, MD [place] [facility]   Showing recent visits within past 365 days and meeting all other requirements  Future Appointments  Date Type Provider Dept   06/06/24 Appointment Baldwin Alm Lever, MD  Primary Care At [facility]   Showing future appointments within next 365 days and meeting all other requirements       Labs: Not applicable this refill

## 2024-04-28 DIAGNOSIS — M791 Myalgia, unspecified site: Principal | ICD-10-CM

## 2024-04-28 DIAGNOSIS — I25118 Atherosclerotic heart disease of native coronary artery with other forms of angina pectoris: Principal | ICD-10-CM

## 2024-04-28 DIAGNOSIS — I701 Atherosclerosis of renal artery: Principal | ICD-10-CM

## 2024-04-28 DIAGNOSIS — I480 Paroxysmal atrial fibrillation: Principal | ICD-10-CM

## 2024-04-28 DIAGNOSIS — I152 Hypertension secondary to endocrine disorders: Principal | ICD-10-CM

## 2024-04-28 DIAGNOSIS — T466X5A Adverse effect of antihyperlipidemic and antiarteriosclerotic drugs, initial encounter: Principal | ICD-10-CM

## 2024-04-28 DIAGNOSIS — E1169 Type 2 diabetes mellitus with other specified complication: Principal | ICD-10-CM

## 2024-04-28 DIAGNOSIS — Z789 Other specified health status: Principal | ICD-10-CM

## 2024-04-28 DIAGNOSIS — E1159 Type 2 diabetes mellitus with other circulatory complications: Principal | ICD-10-CM

## 2024-04-28 DIAGNOSIS — E785 Hyperlipidemia, unspecified: Principal | ICD-10-CM

## 2024-04-28 MED ORDER — EVOLOCUMAB 140 MG/ML SUBCUTANEOUS PEN INJECTOR
SUBCUTANEOUS | 3 refills | 84.00000 days | Status: CP
Start: 2024-04-28 — End: ?

## 2024-04-28 NOTE — Progress Notes (Signed)
 [facility] CARDIOLOGY    [facility]  [address]  [place], [name] [address]  Ph: [phone]  Fax: [phone] Specialty Care at [facility]  [address], [address]  [place], [name] [address]   Ph: [phone]  Fax: [phone]     Date of Service: 04/28/2024    PCP: Baldwin Alm Lever, MD  Referring Provider: Greig Macario Abu, PA    ASSESSMENT and PLAN:     Jesus Rubio is a 79 y.o. male presenting at the request of Amy Macario Abu, PA for evaluation and management of:  1. Coronary artery disease of native artery of native heart with stable angina pectoris    2. Hypertension associated with diabetes (CMS-HCC)    3. Hyperlipidemia associated with type 2 diabetes mellitus (CMS-HCC)    4. Paroxysmal atrial fibrillation    (CMS-HCC)    5. Renal artery stenosis    6. Myalgia due to statin    7. Statin intolerance    8. Type 2 diabetes mellitus with other specified complication, without long-term current use of insulin (CMS-HCC)      [name] presents in follow up.  I last saw him July 2025. His angina is stable. He tries to do some things around his home, go to grocery, and does have DOE - this is also quite stable from previous. We reviewed his medications - I do have him on antiplatelet and OAC given his severe CAD and history of atrial arrhythmia. No GI or GU bleeding. I have sent him another prescription for Repatha  to [place] [facility]. Follow up with me in 6 months.    1. 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 NSTEMI while hospitalized for COVID, managed medically with plavix . Plavix  was later discontinued in favor of eliquis  due to PE  - anti-platelets: plavix  6 months post PCI renal artery stenosis => switch to aspirin 12/2021 (w/ eliquis )  - anti-anginal: metoprolol  succinate 100mg  BID, imdur  60mg  daily  - statin: atorvastatin  40mg  daily => 10mg  ?muscle aches on higher dose => crestor  20mg  daily => Repatha , stop crestor   - last echo: 05/12/20 normal EF  - last stress/angiography:04/2021 see below. New since 2016 is CTO left main    2. Afib and Atrial flutter s/p ablation x2. Recurrence of paroxysmal Afib during hospitalization for COVID19 03/2020-04/2020. ER visit 02/2021 for symptomatic afib. No clear trigger.  - CHADS2-Vasc score: 6 (HTN, age+2, PE and CVA+2, IHD)  - Anti-coagulation: Eliquis   - Rate control: metoprolol  as above, additional 50mg  prn dose if needed for sustained palpitations  - Rhythm control: not indicated    3. COVID19 with lengthy, complicated hospitalization 03/2020-04/2020. ARDS, NSTEMI, GI bleed, ARF requiring dialysis, occipital lobe CVA, PE  - has SOB with exertion, stable    4. History of PE  - on eliquis     5. R subclavian stenosis  - BP in left arm    6. 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     [name], MD, PhD  [facility] Cardiology      ORDERS THIS VISIT:     No orders of the defined types were placed in this encounter.     New Prescriptions    EVOLOCUMAB  140 MG/ML PNIJ    Inject 140 mg under the skin every fourteen (14) days.      Modified  Medications    No medications on file      Discontinued Medications    EVOLOCUMAB  (REPATHA  SURECLICK) 140 MG/ML PNIJ    Inject the contents of 1 pen (140 mg total) under the skin every fourteen (14) days.        SUBJECTIVE:     ID: Mr. Kadlec is a 79 y.o. male with a history of CAD s/p CABG, aflutter s/p ablation, COVID19 with numerous complications during hospitalization 03/2020-04/2020    Reason for Visit: Follow-up     Cardiovascular History:    Previously evaluated by Dr Von and last saw him 05/2019. He has a history of CABG and aflutter s/p ablation. Ziopatch 06/2019 did not show recurrence of aflutter. Prolonged hospitalization at [facility] from 03/17/20 to 04/17/20 for COVID19 with hypoxic respiratory failure due to ARDS, bilateral subsegmental PE, GI bleed requiring transfusion, NSTEMI, afib, L occipital lobe CVA.    Presented to [facility] ER 07/29/20 after an episode of syncope. ECG showed sinus rhythm with RBBB. A ziopatch was ordered. Ziopatch showed 16 short SVT episodes, appear to be atrial tach.    12/2020 right sided rib fractures    02/26/21 ER visit for afib with RVR and HTN. Prescribed prn diltiazem  by ER in addition to BID metoprolol .    04/2021 PET stress for evaluation of CP, showing large reversible apical/anterior/septal defect. Referred for [facility]): interval proximal occlusion of the left main, LIMA is patent but mid LAD occlusion prevents backfilling of the proximal LAD. Unsuccessful attempt to pass a wire through CTO left main. All grafts patent. RAS 70% on the R. Also found to have differential of >23mmHg SBP/DBP between L and R arm.     05/2021 reviewed case with Dr's Patricia Ship. Plan for treatment of RAS. Do not think coronary intervention will be worth the risk. S/p DES to right renal artery. Angiography also showed right subclavian stenosis.    Interval History:    Some chest discomfort nothing major    DOE - stable. But able to get out to the grocery store and doing a little walking outside.    8 ntg since last visit.    No major palpitations.    No GI or GU bleeding.     Review of Systems  10 systems were reviewed and negative except as noted in HPI.    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    Prior Cardiovascular Interventions / Surgery:  CABG 2006 LIMA-LAD,  SVG-OM, SVG-PDA  Aflutter ablation 2016, and 2017  R renal artery PCI 05/2021 [facility]    Prior Cardiovascular Diagnostics:    ECG   06/28/20 sinus rhythm 77bpm, RBBB with non-specific ST/TW abnormalities  02/26/21 afib 120bpm, RBBB  03/05/21 sinus rhythm with PACs, RBBB  04/04/21 sinus rhythm with PACs, RBBB  07/04/21 sinus rhythm, RBBB  10/21/23 sinus  rhythm, right axis, RBBB, PACs    Echo 04/11/20 EF normal  Echo 12/2020 normal EF, normal RV  Echo 10/2021 normal EF    Ziopatch 07/2020  - 16 short SVT episodes were recorded, the longest lasting 7.7 seconds. These appear to be atrial tachycardia.   - Otherwise unremarkable 14 day ambulatory cardiac monitor without patient triggered events.     PET Stress 04/2021  - Abnormal myocardial perfusion study  - There is a large in size, moderately severe, completely reversible perfusion defect involving the apical, apical anterior, mid anterior, basal anterior, apical septal, mid anteroseptal, basal anteroseptal, mid inferoseptal and basal inferoseptal segments. This is consistent with probable ischemia.  - Left ventricular systolic function is normal. Post stress the ejection fraction is > 60%.  - Coronary and aortic calcifications are noted  - Aortic valve calcifications are noted  - Attenuation CT scan shows post CABG findings  - Status post cholecystectomy  - Nonspecific bilateral reticular changes in the lungs, including areas of atelectasis    LHC 05/04/21  Small area of lateral wall hypokinesis with preserved LV systolic function.  Coronary artery disease including proximal occlusion of the left main and long 70% lesion in the mid-RCA.  Prior coronary bypass surgery with patent LIMA to distal LAD, patent SVG to OM1 and OM2 and patent SVG to PDA.   70% right renal artery stenosis. Widely patent left renal artery.  Interval change since angiography on 07/14/14 was total occlusion of left main. Proximal LAD was now supplied by collaterals from PDA since occlusion of mid-LAD (present in 2016) prevented backfilling of the proximal LAD from LIMA to LAD graft.  Unsuccessful attempt to pass a wire through totally occluded left main.      Other Surgical History:   has a past surgical history that includes Replacement total knee; Cholecystectomy; Thyroidectomy, partial; Prostate surgery; Foot arthrodesis, triple; Cardiac surgery (2006); Joint replacement; pr upper gi endoscopy,biopsy (N/A, 12/01/2013); pr compre ep eval abltj 3d mapg tx svt (N/A, 05/16/2015); Knee arthroscopy; pr colonoscopy flx dx w/collj spec when pfrmd (N/A, 04/03/2020); pr gi imag intraluminal esophagus-ileum w/i&r (N/A, 04/05/2020); pr cath place/coron angio, img super/interp,w left heart ventriculography (N/A, 05/03/2021); and pr revascularize fem/pop artery,angioplasty/stent (N/A, 06/03/2021).    Current Outpatient Medications   Medication Instructions    acetaminophen  (TYLENOL ) 650 mg, Oral, Every 6 hours PRN    albuterol  HFA 90 mcg/actuation inhaler 2 puffs, Inhalation, Every 6 hours PRN    aspirin (ECOTRIN) 81 mg, Oral, Daily (standard)    blood sugar diagnostic (ONETOUCH VERIO TEST STRIPS) Strp USE TO CHECK BLOOD SUGAR 1-2 TIMES PER DAY    blood-glucose meter kit Check BS every day    buPROPion  (WELLBUTRIN  XL) 150 mg, Oral, Every morning    cyanocobalamin  (vitamin B-12) 1,000 mcg, Subcutaneous, Every 30 days    dilTIAZem  (CARDIZEM ) 30 MG tablet 1 tab Q3 hr PRN bad palpitations, HR>110.  Be sure top number blood pressure over 95.    donepezil  (ARICEPT ) 5 mg, Oral, Nightly    ELIQUIS  5 mg, Oral, 2 times a day (standard)    empty container (SHARPS-A-GATOR DISPOSAL SYSTEM) Misc Use as directed for sharps disposal    ergocalciferol  (DRISDOL ) 1,250 mcg, Oral, Weekly evolocumab  140 mg, Subcutaneous, Every 14 days    fluticasone  propionate (FLONASE ) 50 mcg/actuation nasal spray 2 sprays, Each Nare, Daily (standard)    gabapentin  (NEURONTIN ) 100 mg, Oral, 3 times a day (standard)    gentamicin (GARAMYCIN) 0.1 % cream 1 Application  ipratropium (ATROVENT ) 21 mcg (0.03 %) nasal spray SPRAY 2 SPRAYS INTO EACH NOSTRIL 3 TIMES A DAY    isosorbide  mononitrate (IMDUR ) 60 mg, Oral, Daily (standard)    metoPROLOL  succinate (TOPROL -XL) 200 mg, Oral, Daily (standard)    miscellaneous medical supply Misc Glucometer  Dx: E11.9    miscellaneous medical supply Misc Glucometer test strips   Dx: E11.9    miscellaneous medical supply Misc Lancets  Dx  E11.9    nitroglycerin  (NITROSTAT ) 0.4 mg, Sublingual, Every 5 min PRN    nystatin  (MYCOSTATIN ) 100,000 unit/gram cream Topical, 3 times a day (standard)    pramoxine 1 % Crea Apply up to three times daily. Limit to 7 days consistent use.    ranolazine  (RANEXA ) 500 mg, Oral, 2 times a day (standard)    silodosin  4 mg, Oral, Daily    traZODone  (DESYREL ) 100 mg, Oral, Nightly PRN      Allergies:  is allergic to escitalopram  oxalate, ace inhibitors, escitalopram , and statins-hmg-coa reductase inhibitors.    Social History:  He  reports that he quit smoking about 52 years ago. His smoking use included cigarettes. He started smoking about 67 years ago. He has a 15 pack-year smoking history. He has been exposed to tobacco smoke. He has never used smokeless tobacco. He reports that he does not drink alcohol and does not use drugs.    Family History:  His family history includes Cancer in his brother, brother, brother, father, mother, sister, and sister; Depression in his mother; Heart attack in his brother, father, and mother; Lung cancer in his brother.       OBJECTIVE:      BP 112/70 (BP Site: L Arm, BP Position: Sitting, BP Cuff Size: Large)  - Pulse 76  - Temp 36.2 ??C (97.2 ??F) (Temporal)  - Resp 18  - Wt 93.6 kg (206 lb 6.4 oz)  - SpO2 95%  - BMI 27.99 kg/m??      Wt Readings from Last 12 Encounters:   04/28/24 93.6 kg (206 lb 6.4 oz)   04/05/24 95.7 kg (211 lb)   03/10/24 95.9 kg (211 lb 8 oz)   12/10/23 97.5 kg (215 lb)   10/21/23 97.3 kg (214 lb 6.4 oz)   09/09/23 97.5 kg (215 lb)   06/09/23 98.9 kg (218 lb)   06/01/23 (!) 100.8 kg (222 lb 3.2 oz)   05/18/23 98.4 kg (217 lb)   04/06/23 99.8 kg (220 lb)   03/16/23 100.2 kg (221 lb)   03/11/23 100.2 kg (221 lb)     BP Readings from Last 5 Encounters:   04/28/24 112/70   04/05/24 126/76   03/10/24 128/72   12/10/23 140/88   10/21/23 122/70     Pulse Readings from Last 3 Encounters:   04/28/24 76   04/05/24 73   03/10/24 65       General:  Alert, no distress.   Eyes:  EOMI, sclerae anicteric   Neck: No carotid bruit. JVP is not visible sitting upright   Resp:   CTAB bilaterally with normal WOB.   CV:  RRR, normal s1 and s2, no murmurs, normal radial pulses b/l, mild LE edema   GI:   Abdomen soft, non-tender   MSK: Normal muscle tone, no joint swelling/effusion   Skin: No lesions/rashes.   Neuro: No focal deficits.   Psych: Normal mood, normal affect     Lab Results   Component Value Date    HGB 13.8 09/09/2023  HGB 14.7 03/11/2023    HGB 14.2 01/29/2023    PLT 259 09/09/2023    PLT 285 03/11/2023    PLT 275 01/29/2023     Lab Results   Component Value Date    CREATININE 1.20 09/09/2023    CREATININE 1.40 (H) 03/11/2023    CREATININE 1.40 (H) 01/29/2023    K 4.6 09/09/2023    K 4.7 03/11/2023    K 5.3 (H) 01/29/2023      Lab Results   Component Value Date    BNP 408 (H) 03/18/2020     Lab Results   Component Value Date    PROBNP 541.0 (H) 01/16/2022    PROBNP 281.0 04/05/2021    PROBNP 3,890.0 (H) 02/26/2021     Lab Results   Component Value Date    CHOL 137 09/09/2023    CHOL 105 10/08/2022    LDL 59 09/09/2023    LDL 24 (L) 10/08/2022    HDL 46 09/09/2023    HDL 52 10/08/2022    TRIG 775 (H) 09/09/2023    TRIG 143 10/08/2022     Lab Results   Component Value Date    A1C 6.6 (A) 03/10/2024

## 2024-04-28 NOTE — Progress Notes (Signed)
 Abstraction Result Flowsheet Data    This patient's last AWV date: : 10/07/2022  This patients last WCC/CPE date: : Not Found      Reason for Encounter  Reason for Encounter: Outreach  Primary Reason for Outreach: AWV  Text Message: No  MyChart Message: No  Outreach Call Outcome: Left Message

## 2024-04-28 NOTE — Patient Instructions (Addendum)
 Recommendations:  I sent another prescription for Repatha  to [place] [facility]. Call them in a week [phone]   I think its important we try to get you back on it  Let us  know if chest pain episodes are getting worse    Follow-up:  Return in about 6 months (around 10/26/2024).    Thank you for the opportunity to participate in your health care. You may receive a survey about your recent visit via MyChart, text message, or mail.  Please take a moment to complete this survey.  We take your feedback very seriously and will use it to guide our continuous improvement efforts.    [name], MD, PhD  Cardiology

## 2024-05-03 DIAGNOSIS — Z789 Other specified health status: Principal | ICD-10-CM

## 2024-05-03 DIAGNOSIS — E1159 Type 2 diabetes mellitus with other circulatory complications: Secondary | ICD-10-CM

## 2024-05-03 DIAGNOSIS — E1169 Type 2 diabetes mellitus with other specified complication: Secondary | ICD-10-CM

## 2024-05-03 DIAGNOSIS — I152 Hypertension secondary to endocrine disorders: Secondary | ICD-10-CM

## 2024-05-03 DIAGNOSIS — I480 Paroxysmal atrial fibrillation: Secondary | ICD-10-CM

## 2024-05-03 DIAGNOSIS — I25118 Atherosclerotic heart disease of native coronary artery with other forms of angina pectoris: Secondary | ICD-10-CM

## 2024-05-03 DIAGNOSIS — M791 Myalgia, unspecified site: Secondary | ICD-10-CM

## 2024-05-03 DIAGNOSIS — E785 Hyperlipidemia, unspecified: Secondary | ICD-10-CM

## 2024-05-03 DIAGNOSIS — T466X5A Adverse effect of antihyperlipidemic and antiarteriosclerotic drugs, initial encounter: Secondary | ICD-10-CM

## 2024-05-03 DIAGNOSIS — I701 Atherosclerosis of renal artery: Secondary | ICD-10-CM

## 2024-05-05 ENCOUNTER — Encounter
Admit: 2024-05-05 | Discharge: 2024-05-05 | Payer: Medicare (Managed Care) | Attending: Family Medicine | Primary: Family Medicine

## 2024-05-05 DIAGNOSIS — I2510 Atherosclerotic heart disease of native coronary artery without angina pectoris: Secondary | ICD-10-CM

## 2024-05-05 DIAGNOSIS — E1122 Type 2 diabetes mellitus with diabetic chronic kidney disease: Secondary | ICD-10-CM

## 2024-05-05 DIAGNOSIS — E1142 Type 2 diabetes mellitus with diabetic polyneuropathy: Secondary | ICD-10-CM

## 2024-05-05 DIAGNOSIS — J849 Interstitial pulmonary disease, unspecified: Secondary | ICD-10-CM

## 2024-05-05 DIAGNOSIS — N1832 Type 2 diabetes mellitus with stage 3b chronic kidney disease, without long-term current use of insulin (CMS-HCC): Secondary | ICD-10-CM

## 2024-05-05 DIAGNOSIS — I4891 Unspecified atrial fibrillation: Secondary | ICD-10-CM

## 2024-05-05 DIAGNOSIS — N1831 Stage 3a chronic kidney disease (CMS-HCC): Secondary | ICD-10-CM

## 2024-05-05 DIAGNOSIS — Z207 Contact with and (suspected) exposure to pediculosis, acariasis and other infestations: Principal | ICD-10-CM

## 2024-05-05 DIAGNOSIS — R413 Other amnesia: Secondary | ICD-10-CM

## 2024-05-05 LAB — BASIC METABOLIC PANEL
ANION GAP: 7 mmol/L (ref 5–14)
BLOOD UREA NITROGEN: 16 mg/dL (ref 7–21)
BUN / CREAT RATIO: 12
CALCIUM: 9.6 mg/dL (ref 8.5–10.2)
CHLORIDE: 98 mmol/L (ref 98–107)
CO2: 29 mmol/L (ref 22.0–32.0)
CREATININE: 1.3 mg/dL (ref 0.70–1.30)
EGFR CKD-EPI (2021) MALE: 56 mL/min/{1.73_m2} — ABNORMAL LOW (ref >=60)
GLUCOSE RANDOM: 144 mg/dL — ABNORMAL HIGH (ref 74–106)
POTASSIUM: 4.5 mmol/L (ref 3.5–5.0)
SODIUM: 134 mmol/L — ABNORMAL LOW (ref 135–145)

## 2024-05-05 LAB — IRON & TIBC
IRON SATURATION: 28 %
IRON: 112 ug/dL (ref 37–170)
TOTAL IRON BINDING CAPACITY: 398 ug/dL (ref 252–479)

## 2024-05-05 LAB — HEPATIC FUNCTION PANEL
ALBUMIN: 4.5 g/dL (ref 3.4–5.0)
ALKALINE PHOSPHATASE: 60 U/L (ref 38–126)
ALT (SGPT): 19 U/L (ref <50)
AST (SGOT): 24 U/L (ref 19–55)
BILIRUBIN DIRECT: 0 mg/dL (ref 0.00–0.40)
BILIRUBIN TOTAL: 0.5 mg/dL (ref 0.1–1.2)
PROTEIN TOTAL: 7.5 g/dL (ref 6.5–8.3)

## 2024-05-05 LAB — VITAMIN B12: VITAMIN B-12: 574 pg/mL (ref 187–1059)

## 2024-05-05 LAB — FERRITIN: FERRITIN: 19.5 ng/mL — ABNORMAL LOW (ref 30.0–294.0)

## 2024-05-05 LAB — MAGNESIUM: MAGNESIUM: 2 mg/dL (ref 1.6–2.2)

## 2024-05-05 LAB — VITAMIN D 25 HYDROXY: VITAMIN D, TOTAL (25OH): 37.3 ng/mL (ref 30.0–100.0)

## 2024-05-05 LAB — THYROID FUNCTION CASCADE: THYROID STIMULATING HORMONE: 1.26 u[IU]/mL (ref 0.600–3.300)

## 2024-05-05 LAB — FOLATE: FOLATE: 10 ng/mL (ref 5.3–14.4)

## 2024-05-06 LAB — CBC W/ AUTO DIFF
HEMATOCRIT: 43.4 % (ref 39.0–48.0)
HEMOGLOBIN: 14.5 g/dL (ref 12.9–16.5)
MEAN CORPUSCULAR HEMOGLOBIN CONC: 33.5 g/dL (ref 32.0–36.0)
MEAN CORPUSCULAR HEMOGLOBIN: 29.5 pg (ref 25.9–32.4)
MEAN CORPUSCULAR VOLUME: 88.3 fL (ref 77.6–95.7)
MEAN PLATELET VOLUME: 9.4 fL (ref 6.8–10.7)
NUCLEATED RED BLOOD CELLS: 0 /100{WBCs} (ref <=4)
PLATELET COUNT: 252 10*9/L (ref 150–450)
RED BLOOD CELL COUNT: 4.92 10*12/L (ref 4.26–5.60)
RED CELL DISTRIBUTION WIDTH: 15.8 % — ABNORMAL HIGH (ref 12.2–15.2)
WBC ADJUSTED: 8.1 10*9/L (ref 3.6–11.2)

## 2024-05-06 LAB — MANUAL DIFFERENTIAL
EOSINOPHILS - ABS (DIFF): 0.1 10*9/L (ref 0.0–0.4)
EOSINOPHILS - REL (DIFF): 1 %
LYMPHOCYTES - ABS (DIFF): 1.8 10*9/L (ref 0.7–3.1)
LYMPHOCYTES - REL (DIFF): 22 %
MONOCYTES - ABS (DIFF): 0.6 10*9/L (ref 0.1–0.9)
MONOCYTES - REL (DIFF): 7 %
NEUTROPHILS - ABS (DIFF): 5.7 10*9/L (ref 1.4–7.0)
NEUTROPHILS - REL (DIFF): 70 %

## 2024-05-10 DIAGNOSIS — R413 Other amnesia: Principal | ICD-10-CM

## 2024-05-10 DIAGNOSIS — I152 Hypertension secondary to endocrine disorders: Secondary | ICD-10-CM

## 2024-05-10 DIAGNOSIS — E1159 Type 2 diabetes mellitus with other circulatory complications: Secondary | ICD-10-CM

## 2024-05-10 MED ORDER — METOPROLOL SUCCINATE ER 100 MG TABLET,EXTENDED RELEASE 24 HR
ORAL_TABLET | Freq: Every day | ORAL | 1 refills | 0.00000 days
Start: 2024-05-10 — End: ?

## 2024-05-10 MED ORDER — DONEPEZIL 5 MG TABLET
ORAL_TABLET | Freq: Every evening | ORAL | 1 refills | 0.00000 days
Start: 2024-05-10 — End: ?

## 2024-05-12 MED ORDER — DONEPEZIL 5 MG TABLET
ORAL_TABLET | Freq: Every evening | ORAL | 1 refills | 90.00000 days | Status: CP
Start: 2024-05-12 — End: ?

## 2024-05-12 MED ORDER — METOPROLOL SUCCINATE ER 100 MG TABLET,EXTENDED RELEASE 24 HR
ORAL_TABLET | Freq: Every day | ORAL | 1 refills | 90.00000 days | Status: CP
Start: 2024-05-12 — End: ?

## 2024-05-13 NOTE — Telephone Encounter (Signed)
 Patient is requesting the following refill  Requested Prescriptions     Pending Prescriptions Disp Refills    metoPROLOL  succinate (TOPROL -XL) 100 MG 24 hr tablet [Pharmacy Med Name: METOPROLOL  SUCC ER 100 MG TAB] 180 tablet 1     Sig: TAKE 2 TABLETS BY MOUTH EVERY DAY    donepezil  (ARICEPT ) 5 MG tablet [Pharmacy Med Name: DONEPEZIL  HCL 5 MG TABLET] 90 tablet 1     Sig: TAKE 1 TABLET BY MOUTH EVERY DAY AT NIGHT       Recent Visits  Date Type Provider Dept   05/05/24 Office Visit Baldwin Alm Lever, MD [place] [facility]   04/28/24 Office Visit Margaree Spruce, MD [facility] Primary [facility]   04/05/24 Office Visit Baldwin Alm Lever, MD [place] [facility]   03/10/24 Office Visit Baldwin Alm Lever, MD [place] [facility]   12/10/23 Office Visit Baldwin Alm Lever, MD [place] [facility]   10/21/23 Office Visit Margaree Spruce, MD [facility]   09/09/23 Office Visit Baldwin Alm Lever, MD [place] [facility]   06/09/23 Office Visit Baldwin Alm Lever, MD [place] [facility]   06/01/23 Office Visit Margaree Spruce, MD [place] [facility]   Showing recent visits within past 365 days and meeting all other requirements  Future Appointments  Date Type Provider Dept   06/06/24 Appointment Baldwin Alm Lever, MD [place] [facility]   08/04/24 Appointment Baldwin Alm Lever, MD [place] [facility]   10/26/24 Appointment Margaree Spruce, MD [place] [facility]   Showing future appointments within next 365 days and meeting all other requirements       Labs: Vitals:   BP Readings from Last 3 Encounters:   05/05/24 114/68   04/28/24 112/70   04/05/24 126/76    and   Pulse Readings from Last 3 Encounters:   05/05/24 95   04/28/24 76   04/05/24 73
